# Patient Record
Sex: Female | Born: 1955
Health system: Southern US, Community
[De-identification: ages and names within clinical notes are randomized; demographics above are authoritative.]

## PROBLEM LIST (undated history)

## (undated) DIAGNOSIS — T7840XA Allergy, unspecified, initial encounter: Secondary | ICD-10-CM

## (undated) DIAGNOSIS — I10 Essential (primary) hypertension: Secondary | ICD-10-CM

## (undated) DIAGNOSIS — I4891 Unspecified atrial fibrillation: Secondary | ICD-10-CM

## (undated) DIAGNOSIS — I219 Acute myocardial infarction, unspecified: Secondary | ICD-10-CM

## (undated) DIAGNOSIS — G4730 Sleep apnea, unspecified: Secondary | ICD-10-CM

## (undated) DIAGNOSIS — A599 Trichomoniasis, unspecified: Secondary | ICD-10-CM

## (undated) DIAGNOSIS — R609 Edema, unspecified: Secondary | ICD-10-CM

## (undated) DIAGNOSIS — R600 Localized edema: Secondary | ICD-10-CM

## (undated) DIAGNOSIS — I509 Heart failure, unspecified: Secondary | ICD-10-CM

## (undated) DIAGNOSIS — R110 Nausea: Secondary | ICD-10-CM

## (undated) DIAGNOSIS — K7460 Unspecified cirrhosis of liver: Secondary | ICD-10-CM

## (undated) DIAGNOSIS — I2510 Atherosclerotic heart disease of native coronary artery without angina pectoris: Secondary | ICD-10-CM

## (undated) DIAGNOSIS — I499 Cardiac arrhythmia, unspecified: Secondary | ICD-10-CM

## (undated) DIAGNOSIS — R011 Cardiac murmur, unspecified: Secondary | ICD-10-CM

## (undated) DIAGNOSIS — J189 Pneumonia, unspecified organism: Secondary | ICD-10-CM

## (undated) DIAGNOSIS — H269 Unspecified cataract: Secondary | ICD-10-CM

## (undated) HISTORY — DX: Allergy, unspecified, initial encounter: T78.40XA

## (undated) HISTORY — DX: Trichomoniasis, unspecified: A59.9

## (undated) HISTORY — PX: CATARACT EXTRACTION W/ INTRAOCULAR LENS IMPLANT: SHX1309

## (undated) HISTORY — DX: Acute myocardial infarction, unspecified: I21.9

## (undated) HISTORY — PX: CORONARY ANGIOPLASTY WITH STENT PLACEMENT: SHX49

## (undated) HISTORY — PX: DILATION AND CURETTAGE, DIAGNOSTIC / THERAPEUTIC: SUR384

## (undated) HISTORY — DX: Unspecified cataract: H26.9

---

## 1980-06-06 HISTORY — PX: DILATION AND CURETTAGE OF UTERUS: SHX78

## 2009-08-04 DIAGNOSIS — I2510 Atherosclerotic heart disease of native coronary artery without angina pectoris: Secondary | ICD-10-CM

## 2009-08-04 HISTORY — DX: Atherosclerotic heart disease of native coronary artery without angina pectoris: I25.10

## 2010-06-06 HISTORY — PX: CORONARY ANGIOPLASTY WITH STENT PLACEMENT: SHX49

## 2016-02-03 ENCOUNTER — Emergency Department (HOSPITAL_COMMUNITY): Payer: Self-pay

## 2016-02-03 ENCOUNTER — Encounter (HOSPITAL_COMMUNITY): Payer: Self-pay | Admitting: *Deleted

## 2016-02-03 ENCOUNTER — Inpatient Hospital Stay (HOSPITAL_COMMUNITY)
Admission: EM | Admit: 2016-02-03 | Discharge: 2016-02-08 | DRG: 292 | Disposition: A | Discharge status: Home / Self Care | Payer: Medicaid Other | Attending: Internal Medicine | Admitting: Internal Medicine

## 2016-02-03 DIAGNOSIS — Z833 Family history of diabetes mellitus: Secondary | ICD-10-CM

## 2016-02-03 DIAGNOSIS — I509 Heart failure, unspecified: Secondary | ICD-10-CM

## 2016-02-03 DIAGNOSIS — I4891 Unspecified atrial fibrillation: Secondary | ICD-10-CM

## 2016-02-03 DIAGNOSIS — I252 Old myocardial infarction: Secondary | ICD-10-CM

## 2016-02-03 DIAGNOSIS — I110 Hypertensive heart disease with heart failure: Principal | ICD-10-CM | POA: Diagnosis present

## 2016-02-03 DIAGNOSIS — E7800 Pure hypercholesterolemia, unspecified: Secondary | ICD-10-CM | POA: Diagnosis present

## 2016-02-03 DIAGNOSIS — I10 Essential (primary) hypertension: Secondary | ICD-10-CM | POA: Diagnosis not present

## 2016-02-03 DIAGNOSIS — I4892 Unspecified atrial flutter: Secondary | ICD-10-CM

## 2016-02-03 DIAGNOSIS — Z8249 Family history of ischemic heart disease and other diseases of the circulatory system: Secondary | ICD-10-CM

## 2016-02-03 DIAGNOSIS — Z66 Do not resuscitate: Secondary | ICD-10-CM | POA: Diagnosis present

## 2016-02-03 DIAGNOSIS — I482 Chronic atrial fibrillation: Secondary | ICD-10-CM | POA: Diagnosis not present

## 2016-02-03 DIAGNOSIS — F17210 Nicotine dependence, cigarettes, uncomplicated: Secondary | ICD-10-CM | POA: Diagnosis present

## 2016-02-03 DIAGNOSIS — E785 Hyperlipidemia, unspecified: Secondary | ICD-10-CM | POA: Diagnosis present

## 2016-02-03 DIAGNOSIS — I5033 Acute on chronic diastolic (congestive) heart failure: Secondary | ICD-10-CM

## 2016-02-03 DIAGNOSIS — I2510 Atherosclerotic heart disease of native coronary artery without angina pectoris: Secondary | ICD-10-CM | POA: Diagnosis present

## 2016-02-03 DIAGNOSIS — Z955 Presence of coronary angioplasty implant and graft: Secondary | ICD-10-CM

## 2016-02-03 DIAGNOSIS — Z7901 Long term (current) use of anticoagulants: Secondary | ICD-10-CM

## 2016-02-03 DIAGNOSIS — Z9114 Patient's other noncompliance with medication regimen: Secondary | ICD-10-CM

## 2016-02-03 DIAGNOSIS — F1010 Alcohol abuse, uncomplicated: Secondary | ICD-10-CM | POA: Diagnosis present

## 2016-02-03 DIAGNOSIS — Z79899 Other long term (current) drug therapy: Secondary | ICD-10-CM

## 2016-02-03 HISTORY — DX: Essential (primary) hypertension: I10

## 2016-02-03 HISTORY — DX: Unspecified atrial fibrillation: I48.91

## 2016-02-03 HISTORY — DX: Atherosclerotic heart disease of native coronary artery without angina pectoris: I25.10

## 2016-02-03 HISTORY — DX: Heart failure, unspecified: I50.9

## 2016-02-03 LAB — PROTIME-INR
INR: 1.11
Prothrombin Time: 14.3 seconds (ref 11.4–15.2)

## 2016-02-03 LAB — COMPREHENSIVE METABOLIC PANEL
ALT: 19 U/L (ref 14–54)
AST: 16 U/L (ref 15–41)
Albumin: 3.6 g/dL (ref 3.5–5.0)
Alkaline Phosphatase: 61 U/L (ref 38–126)
Anion gap: 9 (ref 5–15)
BILIRUBIN TOTAL: 0.5 mg/dL (ref 0.3–1.2)
BUN: 8 mg/dL (ref 6–20)
CHLORIDE: 106 mmol/L (ref 101–111)
CO2: 27 mmol/L (ref 22–32)
Calcium: 9.3 mg/dL (ref 8.9–10.3)
Creatinine, Ser: 0.83 mg/dL (ref 0.44–1.00)
Glucose, Bld: 106 mg/dL — ABNORMAL HIGH (ref 65–99)
POTASSIUM: 3.6 mmol/L (ref 3.5–5.1)
Sodium: 142 mmol/L (ref 135–145)
TOTAL PROTEIN: 6.5 g/dL (ref 6.5–8.1)

## 2016-02-03 LAB — CBC WITH DIFFERENTIAL/PLATELET
Basophils Absolute: 0 10*3/uL (ref 0.0–0.1)
Basophils Relative: 0 %
EOS PCT: 1 %
Eosinophils Absolute: 0.1 10*3/uL (ref 0.0–0.7)
HEMATOCRIT: 39.9 % (ref 36.0–46.0)
Hemoglobin: 12.9 g/dL (ref 12.0–15.0)
LYMPHS ABS: 2.1 10*3/uL (ref 0.7–4.0)
LYMPHS PCT: 25 %
MCH: 32.3 pg (ref 26.0–34.0)
MCHC: 32.3 g/dL (ref 30.0–36.0)
MCV: 100 fL (ref 78.0–100.0)
MONO ABS: 0.6 10*3/uL (ref 0.1–1.0)
MONOS PCT: 7 %
NEUTROS ABS: 5.7 10*3/uL (ref 1.7–7.7)
Neutrophils Relative %: 67 %
PLATELETS: 231 10*3/uL (ref 150–400)
RBC: 3.99 MIL/uL (ref 3.87–5.11)
RDW: 13.2 % (ref 11.5–15.5)
WBC: 8.5 10*3/uL (ref 4.0–10.5)

## 2016-02-03 LAB — TROPONIN I

## 2016-02-03 LAB — BRAIN NATRIURETIC PEPTIDE: B NATRIURETIC PEPTIDE 5: 405 pg/mL — AB (ref 0.0–100.0)

## 2016-02-03 LAB — MRSA PCR SCREENING: MRSA BY PCR: NEGATIVE

## 2016-02-03 MED ORDER — APIXABAN 5 MG PO TABS
5.0000 mg | ORAL_TABLET | Freq: Two times a day (BID) | ORAL | Status: DC
  Administered 2016-02-03 – 2016-02-08 (×11): 5 mg via ORAL
  Filled 2016-02-03 (×11): qty 1

## 2016-02-03 MED ORDER — SODIUM CHLORIDE 0.9% FLUSH
3.0000 mL | INTRAVENOUS | Status: DC | PRN
  Administered 2016-02-04: 3 mL via INTRAVENOUS
  Filled 2016-02-03: qty 3

## 2016-02-03 MED ORDER — FUROSEMIDE 10 MG/ML IJ SOLN
40.0000 mg | Freq: Once | INTRAMUSCULAR | Status: AC
  Administered 2016-02-03: 40 mg via INTRAVENOUS
  Filled 2016-02-03: qty 4

## 2016-02-03 MED ORDER — SODIUM CHLORIDE 0.9% FLUSH
3.0000 mL | Freq: Two times a day (BID) | INTRAVENOUS | Status: DC
  Administered 2016-02-03 – 2016-02-08 (×10): 3 mL via INTRAVENOUS

## 2016-02-03 MED ORDER — ONDANSETRON HCL 4 MG/2ML IJ SOLN: 4.0000 mg | Freq: Four times a day (QID) | INTRAMUSCULAR | Status: DC | PRN

## 2016-02-03 MED ORDER — METOPROLOL TARTRATE 50 MG PO TABS
50.0000 mg | ORAL_TABLET | Freq: Once | ORAL | Status: AC
  Administered 2016-02-03: 50 mg via ORAL
  Filled 2016-02-03: qty 1

## 2016-02-03 MED ORDER — LOSARTAN POTASSIUM 50 MG PO TABS
50.0000 mg | ORAL_TABLET | Freq: Every day | ORAL | Status: DC
  Administered 2016-02-04: 50 mg via ORAL
  Filled 2016-02-03: qty 1

## 2016-02-03 MED ORDER — LOSARTAN POTASSIUM 50 MG PO TABS
ORAL_TABLET | ORAL | Status: AC
  Filled 2016-02-03: qty 1

## 2016-02-03 MED ORDER — METOPROLOL TARTRATE 50 MG PO TABS
50.0000 mg | ORAL_TABLET | Freq: Two times a day (BID) | ORAL | Status: DC
  Administered 2016-02-03: 50 mg via ORAL
  Filled 2016-02-03: qty 1

## 2016-02-03 MED ORDER — ACETAMINOPHEN 325 MG PO TABS: 650.0000 mg | ORAL_TABLET | ORAL | Status: DC | PRN

## 2016-02-03 MED ORDER — LOSARTAN POTASSIUM 50 MG PO TABS
50.0000 mg | ORAL_TABLET | Freq: Once | ORAL | Status: AC
  Administered 2016-02-03: 50 mg via ORAL
  Filled 2016-02-03: qty 1

## 2016-02-03 MED ORDER — FUROSEMIDE 10 MG/ML IJ SOLN
20.0000 mg | Freq: Two times a day (BID) | INTRAMUSCULAR | Status: DC
  Administered 2016-02-03 – 2016-02-05 (×4): 20 mg via INTRAVENOUS
  Filled 2016-02-03 (×4): qty 2

## 2016-02-03 MED ORDER — ASPIRIN 81 MG PO CHEW
324.0000 mg | CHEWABLE_TABLET | Freq: Once | ORAL | Status: AC
  Administered 2016-02-03: 324 mg via ORAL
  Filled 2016-02-03: qty 4

## 2016-02-03 MED ORDER — METOPROLOL TARTRATE 5 MG/5ML IV SOLN
5.0000 mg | Freq: Once | INTRAVENOUS | Status: AC
Start: 2016-02-03 — End: 2016-02-03
  Administered 2016-02-03: 5 mg via INTRAVENOUS
  Filled 2016-02-03: qty 5

## 2016-02-03 MED ORDER — HYDRALAZINE HCL 25 MG PO TABS
25.0000 mg | ORAL_TABLET | Freq: Four times a day (QID) | ORAL | Status: DC | PRN
  Administered 2016-02-03: 25 mg via ORAL
  Filled 2016-02-03: qty 1

## 2016-02-03 MED ORDER — HYDRALAZINE HCL 20 MG/ML IJ SOLN
10.0000 mg | Freq: Once | INTRAMUSCULAR | Status: AC
  Administered 2016-02-03: 10 mg via INTRAVENOUS
  Filled 2016-02-03: qty 1

## 2016-02-03 MED ORDER — SODIUM CHLORIDE 0.9 % IV SOLN: 250.0000 mL | INTRAVENOUS | Status: DC | PRN

## 2016-02-03 NOTE — Progress Notes (Signed)
Called report to [facility] on 300 who will be taking patient. Patient has been alert and oriented with no complaints today.

## 2016-02-03 NOTE — Progress Notes (Signed)
ANTICOAGULATION CONSULT NOTE - Initial Consult  Pharmacy Consult for APIXABAN Indication: atrial fibrillation  No Known Allergies  Patient Measurements: Height: 5\' 3"  (160 cm) Weight: 208 lb 5.4 oz (94.5 kg) IBW/kg (Calculated) : 52.4  Vital Signs: Temp: 97.2 F (36.2 C) (08/30 0750) Temp Source: Oral (08/30 0750) BP: 147/105 (08/30 0530) Pulse Rate: 96 (08/30 0530)  Labs:  Recent Labs  02/03/16 0253 02/03/16 0428  HGB 12.9  --   HCT 39.9  --   PLT 231  --   LABPROT  --  14.3  INR  --  1.11  CREATININE 0.83  --   TROPONINI <0.03  --    Estimated Creatinine Clearance: 80.7 mL/min (by C-G formula based on SCr of 0.83 mg/dL).  Medical History: Past Medical History:  Diagnosis Date  . Atrial fibrillation ([place])   . CHF (congestive heart failure) ([place])   . Hypertension    Medications:  Prescriptions Prior to Admission  Medication Sig Dispense Refill Last Dose  . amiodarone (PACERONE) 200 MG tablet Take 200 mg by mouth daily.   02/02/2016 at Unknown time  . apixaban (ELIQUIS) 5 MG TABS tablet Take 5 mg by mouth 2 (two) times daily.   02/02/2016  . aspirin EC 81 MG tablet Take 81 mg by mouth daily.   02/02/2016 at Unknown time  . atorvastatin (LIPITOR) 20 MG tablet Take 20 mg by mouth daily.   02/02/2016 at Unknown time  . furosemide (LASIX) 40 MG tablet Take 40 mg by mouth daily.   02/02/2016 at Unknown time  . losartan (COZAAR) 50 MG tablet Take 50 mg by mouth daily.   01/28/2016  . metoprolol (LOPRESSOR) 100 MG tablet Take 100 mg by mouth daily.   01/28/2016    Assessment: 60yo female on Apixaban PTA.  No bleeding reported.  CBC stable.  Last dose reportedly 8/29 but INR at baseline (which may indicate pt has not had dose)  Goal of Therapy:  Anticoagulation with Apixaban Monitor platelets by anticoagulation protocol: Yes   Plan:  Resume home dose:  Apixaban 5mg  po bid Monitor labs, s/sx of bleeding complications  Hart Robinsons A 02/03/2016,11:07 AM

## 2016-02-03 NOTE — H&P (Signed)
TRH H&P   Patient Demographics:    Rhonda [name], is a 60 y.o. female  MRN: [id number]  DOB - Apr 05, 1956  Admit Date - 02/03/2016  Outpatient Primary MD for the patient is No PCP Per Patient  Referring MD/NP/PA: Dr Ward  Patient coming from: Home  Chief Complaint  Patient presents with  . Hypertension      HPI:    Rhonda [name]  is a 60 y.o. female, With history of hypertension, hyperlipidemia, CHF, atrial fibrillation on Eliquis, who came to ED with worsening shortness of breath for possible days. Patient also has been having worsening of lower extremity swelling. Patient just moved to [place] from [place], and ran out of her medications including losartan and metoprolol. Patient also takes amiodarone (dose not known at this time).  She denies chest pain, no nausea vomiting or diarrhea.  In the ED lab revealed BNP 405, patient is hypertensive. Chest x-ray showed interstitial edema Patient received 40 mg IV Lasix, metoprolol 50 mg by mouth 1 and losartan 50 mg 1    Review of systems:    In addition to the HPI above,  No Fever-chills, No Headache, No changes with Vision or hearing, No problems swallowing food or Liquids, No Abdominal pain, No Nausea or Vomiting, bowel movements are regular, No Blood in stool or Urine, No dysuria, No new skin rashes or bruises, No new joints pains-aches,  No new weakness, tingling, numbness in any extremity, No recent weight gain or loss, No polyuria, polydypsia or polyphagia, No significant Mental Stressors.  A full 10 point Review of Systems was done, except as stated above, all other Review of Systems were negative.   With Past History of the following :    Past Medical History:  Diagnosis Date  . Atrial fibrillation ([place])   . CHF (congestive heart failure) ([place])   . Hypertension       Past Surgical History:  Procedure Laterality Date   . c-section    . CORONARY ANGIOPLASTY WITH STENT PLACEMENT        Social History:     Social History  Substance Use Topics  . Smoking status: Current Every Day Smoker    Packs/day: 0.20    Types: Cigarettes  . Smokeless tobacco: Never Used  . Alcohol use Yes     Comment: a few times times per week        Family History :   Patient's mother, grandmother had history of CAD   Home Medications:   Prior to Admission medications   Not on File     Allergies:    No Known Allergies   Physical Exam:   Vitals  Blood pressure (!) 147/105, pulse 96, temperature 98.7 F (37.1 C), temperature source Oral, resp. rate 22, height 5\' 3"  (1.6 m), weight 90.7 kg (200 lb), SpO2 97 %.   1. General Caucasian female lying in bed in NAD, cooperative with exam  2. Normal affect and insight, Awake Alert, Oriented X 3.  3. No  F.N deficits, ALL C.Nerves Intact, Strength 5/5 all 4 extremities, Sensation intact all 4 extremities, Plantars down going.  4. Ears and Eyes appear Normal, Conjunctivae clear, PERRLA. Moist Oral Mucosa.  5. Supple Neck, No JVD, No cervical lymphadenopathy appriciated, No Carotid Bruits.  6. Symmetrical Chest wall movement, bibasilar crackles  7. RRR, No Gallops, Rubs or Murmurs, No Parasternal Heave.Bilateral 1+ pitting edema of lower extremities  8. Positive Bowel Sounds, Abdomen Soft, No tenderness, No organomegaly appriciated,No rebound -guarding or rigidity.  9.  No Cyanosis, Normal Skin Turgor, No Skin Rash or Bruise.  10. Good muscle tone,  joints appear normal , no effusions, Normal ROM.      Data Review:    CBC  Recent Labs Lab 02/03/16 0253  WBC 8.5  HGB 12.9  HCT 39.9  PLT 231  MCV 100.0  MCH 32.3  MCHC 32.3  RDW 13.2  LYMPHSABS 2.1  MONOABS 0.6  EOSABS 0.1  BASOSABS 0.0   ------------------------------------------------------------------------------------------------------------------  Chemistries   Recent Labs Lab  02/03/16 0253  NA 142  K 3.6  CL 106  CO2 27  GLUCOSE 106*  BUN 8  CREATININE 0.83  CALCIUM 9.3  AST 16  ALT 19  ALKPHOS 61  BILITOT 0.5   ------------------------------------------------------------------------------------------------------------------  ------------------------------------------------------------------------------------------------------------------ GFR: Estimated Creatinine Clearance: 79 mL/min (by C-G formula based on SCr of 0.83 mg/dL). Liver Function Tests:  Recent Labs Lab 02/03/16 0253  AST 16  ALT 19  ALKPHOS 61  BILITOT 0.5  PROT 6.5  ALBUMIN 3.6   No results for input(s): LIPASE, AMYLASE in the last 168 hours. No results for input(s): AMMONIA in the last 168 hours. Coagulation Profile:  Recent Labs Lab 02/03/16 0428  INR 1.11   Cardiac Enzymes:  Recent Labs Lab 02/03/16 0253  TROPONINI <0.03   BNP (last 3 results) No results for input(s): PROBNP in the last 8760 hours. HbA1C: No results for input(s): HGBA1C in the last 72 hours. CBG: No results for input(s): GLUCAP in the last 168 hours. Lipid Profile: No results for input(s): CHOL, HDL, LDLCALC, TRIG, CHOLHDL, LDLDIRECT in the last 72 hours. Thyroid Function Tests: No results for input(s): TSH, T4TOTAL, FREET4, T3FREE, THYROIDAB in the last 72 hours. Anemia Panel: No results for input(s): VITAMINB12, FOLATE, FERRITIN, TIBC, IRON, RETICCTPCT in the last 72 hours.  --------------------------------------------------------------------------------------------------------------- Urine analysis: No results found for: COLORURINE, APPEARANCEUR, LABSPEC, PHURINE, GLUCOSEU, HGBUR, BILIRUBINUR, KETONESUR, PROTEINUR, UROBILINOGEN, NITRITE, LEUKOCYTESUR    ----------------------------------------------------------------------------------------------------------------   Imaging Results:    Dg Chest 2 View  Result Date: 02/03/2016 CLINICAL DATA:  Worsening shortness of breath and  bilateral lower extremity swelling for 1 week. History of hypertension, congestive failure, atrial fibrillation, smoker EXAM: CHEST  2 VIEW COMPARISON:  None. FINDINGS: Cardiac enlargement with mild central pulmonary vascular congestion. Interstitial pattern to the lungs suggesting early edema. No focal consolidation. No blunting of costophrenic angles. No pneumothorax. Mediastinal contours appear intact. Tortuous aorta. IMPRESSION: Mild cardiac enlargement and pulmonary vascular congestion with diffuse interstitial edema. Electronically Signed   By: Lucienne Capers M.D.   On: 02/03/2016 03:40       Assessment & Plan:    Active Problems:   CHF exacerbation (HCC)   HTN (hypertension)   Atrial fibrillation ([place])   1. CHF exacerbation- patient had echocardiogram done in [place] seven months ago, will obtain records from [facility] in [place]. Patient received Lasix 40 mg IV in the ED, the start Lasix 20 mg IV every 12 hours, started intake and output. Daily  weights 2. Hypertension- continue metoprolol 50 mg by mouth twice a day, losartan 50 mg daily, start hydralazine 25 mg by mouth every 6 hours when necessary for BP greater than 160/100. 3. Atrial fibrillation- heart rate is controlled, patient takes amiodarone at home. Dose not known, her daughter will get that medication from home and that will have to be started. Continue anticoagulation with Eliquis.    DVT Prop hylaxis-   Eliquis  AM Labs Ordered, also please review Full Orders  Family Communication: Admission, patients condition and plan of care including tests being ordered have been discussed with the patient and her daughter at bedside who indicate understanding and agree with the plan and Code Status.  Code Status:  DO NOT RESUSCITATE  Admission status: Observation    Time spent in minutes : 60 minutes   LAMA,GAGAN S M.D on 02/03/2016 at 5:43 AM  Between 7am to 7pm - Pager - [phone]. After 7pm go to  [email or web address] - password [facility]  [facility] - Office  [phone]

## 2016-02-03 NOTE — Progress Notes (Signed)
PROGRESS NOTE    Rhonda [name]  [id number] DOB: 03-30-1956 DOA: 02/03/2016 PCP: No PCP Per Patient    Brief Narrative:  60 y.o. female, With history of hypertension, hyperlipidemia, CHF, atrial fibrillation on Eliquis, who came to ED with worsening shortness of breath for possible days. Patient also has been having worsening of lower extremity swelling. Patient just moved to [place] from [place], and ran out of her medications including losartan and metoprolol. Patient also takes amiodarone (dose not known at this time).  She denies chest pain, no nausea vomiting or diarrhea.  In the ED lab revealed BNP 405, patient is hypertensive. Chest x-ray showed interstitial edema Patient received 40 mg IV Lasix, metoprolol 50 mg by mouth 1 and losartan 50 mg 1  Assessment & Plan:   Principal Problem:   CHF exacerbation ([place]) Active Problems:   HTN (hypertension)   Atrial fibrillation ([place])   1. Acute CHF exacerbation 1. Unknown if systolic versus diastolic, unknown EF 2. Patient reports having done a 2-D echocardiogram several months prior to this hospital [place] 3. We'll request records from outside hospital, pending 4. For now, we'll continue patient on IV twice a day Lasix as tolerated. 5. Continue to monitor I's and O's and daily weights 6. Patient has shown clinical improvement thus far since admission 2. Hypertension 1. Blood pressure currently stable. 2. Continue current regimen consisting of losartan and metoprolol as well as diuretic 3. Atrial fibrillation 1. Currently rate controlled 2. Continue monitor on telemetry 3. We'll continue on apixaban as tolerated for secondary stroke prevention  DVT prophylaxis: Apixaban Code Status: Full code Family Communication: He should room, family at bedside Disposition Plan: Possible discharge in 24-48 hours  Consultants:     Procedures:     Antimicrobials: Anti-infectives    None       Subjective: Reports feeling much better today. Questioning about going home today. Denies further shortness of breath. Denies chest pain  Objective: Vitals:   02/03/16 1000 02/03/16 1100 02/03/16 1200 02/03/16 1300  BP: (!) 134/102 (!) 139/111 (!) 157/110 (!) 138/106  Pulse:      Resp: (!) 24 (!) 30 14 (!) 25  Temp:      TempSrc:      SpO2: 96% 99% 97% 96%  Weight:      Height:        Intake/Output Summary (Last 24 hours) at 02/03/16 1401 Last data filed at 02/03/16 0900  Gross per 24 hour  Intake              360 ml  Output                0 ml  Net              360 ml   Filed Weights   02/03/16 0221 02/03/16 0609  Weight: 90.7 kg (200 lb) 94.5 kg (208 lb 5.4 oz)    Examination:  General exam: Appears calm and comfortable, Lying in bed Respiratory system: Clear to auscultation. Respiratory effort normal. Cardiovascular system: S1 & S2 heard, RRR. Marland Kitchen Gastrointestinal system: Abdomen is nondistended, soft and nontender. No organomegaly or masses felt. Normal bowel sounds heard. Central nervous system: Alert and oriented. No focal neurological deficits. Extremities: Symmetric 5 x 5 power. Skin: No rashes, lesions, no clubbing or cyanosis Psychiatry: Judgement and insight appear normal. Mood & affect appropriate.   Data Reviewed: I have personally reviewed following labs and imaging studies  CBC:  Recent Labs  Lab 02/03/16 0253  WBC 8.5  NEUTROABS 5.7  HGB 12.9  HCT 39.9  MCV 100.0  PLT [id number]   Basic Metabolic Panel:  Recent Labs Lab 02/03/16 0253  NA 142  K 3.6  CL 106  CO2 27  GLUCOSE 106*  BUN 8  CREATININE 0.83  CALCIUM 9.3   GFR: Estimated Creatinine Clearance: 80.7 mL/min (by C-G formula based on SCr of 0.83 mg/dL). Liver Function Tests:  Recent Labs Lab 02/03/16 0253  AST 16  ALT 19  ALKPHOS 61  BILITOT 0.5  PROT 6.5  ALBUMIN 3.6   No results for input(s): LIPASE, AMYLASE in the last 168 hours. No results for input(s): AMMONIA in  the last 168 hours. Coagulation Profile:  Recent Labs Lab 02/03/16 0428  INR 1.11   Cardiac Enzymes:  Recent Labs Lab 02/03/16 0253  TROPONINI <0.03   BNP (last 3 results) No results for input(s): PROBNP in the last 8760 hours. HbA1C: No results for input(s): HGBA1C in the last 72 hours. CBG: No results for input(s): GLUCAP in the last 168 hours. Lipid Profile: No results for input(s): CHOL, HDL, LDLCALC, TRIG, CHOLHDL, LDLDIRECT in the last 72 hours. Thyroid Function Tests: No results for input(s): TSH, T4TOTAL, FREET4, T3FREE, THYROIDAB in the last 72 hours. Anemia Panel: No results for input(s): VITAMINB12, FOLATE, FERRITIN, TIBC, IRON, RETICCTPCT in the last 72 hours. Sepsis Labs: No results for input(s): PROCALCITON, LATICACIDVEN in the last 168 hours.  No results found for this or any previous visit (from the past 240 hour(s)).   Radiology Studies: Dg Chest 2 View  Result Date: 02/03/2016 CLINICAL DATA:  Worsening shortness of breath and bilateral lower extremity swelling for 1 week. History of hypertension, congestive failure, atrial fibrillation, smoker EXAM: CHEST  2 VIEW COMPARISON:  None. FINDINGS: Cardiac enlargement with mild central pulmonary vascular congestion. Interstitial pattern to the lungs suggesting early edema. No focal consolidation. No blunting of costophrenic angles. No pneumothorax. Mediastinal contours appear intact. Tortuous aorta. IMPRESSION: Mild cardiac enlargement and pulmonary vascular congestion with diffuse interstitial edema. Electronically Signed   By: Lucienne Capers M.D.   On: 02/03/2016 03:40    Scheduled Meds: . apixaban  5 mg Oral BID  . furosemide  20 mg Intravenous Q12H  . [START ON 02/04/2016] losartan  50 mg Oral Daily  . metoprolol tartrate  50 mg Oral Q12H  . sodium chloride flush  3 mL Intravenous Q12H   Continuous Infusions:    LOS: 0 days   Sigmund Morera, Orpah Melter, MD [facility] Pager [phone]  If 7PM-7AM,  please contact night-coverage [email or web address] Password [email or web address] 02/03/2016, 2:01 PM

## 2016-02-03 NOTE — ED Provider Notes (Signed)
TIME SEEN: 2:45 AM  CHIEF COMPLAINT: Shortness of breath, lower extremity swelling  HPI: Pt is a 60 y.o. female with history of hypertension, hyperlipidemia, atrial fibrillation on Eliquis, CHF who presents to the emergency department with complaints of increased shortness of breath with exertion over the past several days, increased lower extremity swelling. She states she has had several months of shortness of breath and lower extremity swelling but this is progressively getting worse especially over the last week. States she just moved here from [place] and does not have a PCP and ran out of many of her medications including her metoprolol, losartan. She has been taking only half of her Lasix dose daily. Is supposed to be on 40 mg a day. Has not missed any doses of her amiodarone or Eliquis. States that her daughter checked her vital signs at home tonight and her heart rate was elevated as well as her blood pressure. She denies any chest pain or chest discomfort. No fever or cough. Daughter does report that she also has a history of alcohol abuse and was drinking a plate of ROM every day. She did have one beer today. She has not stopped drinking but daughter reports she has cut down.  ROS: See HPI Constitutional: no fever  Eyes: no drainage  ENT: no runny nose   Cardiovascular:  no chest pain  Resp:  SOB  GI: no vomiting GU: no dysuria Integumentary: no rash  Allergy: no hives  Musculoskeletal: no leg swelling  Neurological: no slurred speech ROS otherwise negative  PAST MEDICAL HISTORY/PAST SURGICAL HISTORY:  Past Medical History:  Diagnosis Date  . Atrial fibrillation ([place])   . CHF (congestive heart failure) ([place])   . Hypertension     MEDICATIONS:  Prior to Admission medications   Not on File    ALLERGIES:  No Known Allergies  SOCIAL HISTORY:  Social History  Substance Use Topics  . Smoking status: Current Every Day Smoker    Packs/day: 0.20    Types: Cigarettes  .  Smokeless tobacco: Never Used  . Alcohol use Yes     Comment: a few times times per week    FAMILY HISTORY: History reviewed. No pertinent family history.  EXAM: BP (!) 156/109   Pulse 90   Temp 98.7 F (37.1 C) (Oral)   Resp 23   Ht 5\' 3"  (1.6 m)   Wt 200 lb (90.7 kg)   SpO2 93%   BMI 35.43 kg/m  CONSTITUTIONAL: Alert and oriented and responds appropriately to questions. Chronically ill-appearing, does appear to get short of breath just with talking to me HEAD: Normocephalic EYES: Conjunctivae clear, PERRL ENT: normal nose; no rhinorrhea; moist mucous membranes NECK: Supple, no meningismus, no LAD  CARD: Irregularly irregular and tachycardic; S1 and S2 appreciated; no murmurs, no clicks, no rubs, no gallops RESP: Normal chest excursion without splinting, patient is tachypneic; breath sounds equal bilaterally; no wheezes, no rhonchi, patient does have bibasilar Rales, no hypoxia or respiratory distress, speaking full sentences but does appear to become short of breath with just speaking at rest ABD/GI: Normal bowel sounds; non-distended; soft, non-tender, no rebound, no guarding, no peritoneal signs BACK:  The back appears normal and is non-tender to palpation, there is no CVA tenderness EXT: Normal ROM in all joints; non-tender to palpation; pitting edema to the bilateral lower extremities and feet, 2+ DP pulses bilaterally, no erythema or warmth, compartments are soft, no joint effusion,; normal capillary refill; no cyanosis, no calf tenderness or swelling  SKIN: Normal color for age and race; warm; no rash NEURO: Moves all extremities equally, sensation to light touch intact diffusely, cranial nerves II through XII intact PSYCH: The patient's mood and manner are appropriate. Grooming and personal hygiene are appropriate.  MEDICAL DECISION MAKING: Patient here with volume overload. She is short of breath with exertion and has increased lower extremity swelling. Likely secondary  to medical noncompliance given she has run out of medications and recently moved here from [place] and does not have a PCP. Will check labs, chest x-ray. EKG shows atrial flutter with no old for comparison. We'll give IV Lasix, a dose of her metoprolol and losartan, aspirin.  ED PROGRESS: Patient's chest x-ray shows pulmonary edema. I feel she will need admission for IV diuresis and she agrees with this plan. Troponin is negative.   4:50 AM  Rcvd records from [facility] in [place], [place].  Patient had echocardiogram on 01/06/2015. That time there was ejection fraction of 50% with grade 2 diastolic dysfunction. Mild to moderate mitral regurgitation. Left atrium was severely dilated.  5:20 AM  Discussed patient's case with hospitalist, Dr. Darrick Meigs.  Recommend admission to telemetry, observation bed.  I will place holding orders per their request. Patient and family (if present) updated with plan. Care transferred to hospitalist service.  I reviewed all nursing notes, vitals, pertinent old records, EKGs, labs, imaging (as available).     Date: 02/03/2016 2:37 AM  Rate: 117  Rhythm: Atrial flutter with 2:1 AV block  QRS Axis: normal  Intervals: QTC 538 ms  ST/T Wave abnormalities: normal  Conduction Disutrbances: none  Narrative Interpretation: Atrial flutter with 2:1 AV block, no old for comparison    Date: 02/03/2016 3:43 AM  Rate: 96  Rhythm: normal sinus rhythm  QRS Axis: normal  Intervals: QTC 507 ms  ST/T Wave abnormalities: normal  Conduction Disutrbances: none  Narrative Interpretation: Atrial flutter, rate improved compared to prior                [employer], DO 02/03/16 [id number]

## 2016-02-03 NOTE — Care Management Note (Signed)
Case Management Note  Patient Details  Name: Rhonda [name] MRN: [id number] Date of Birth: 01-16-1956  Subjective/Objective:                  Pt admitted with CHF. She has recently moved from [place] were she had Medicaid. She does not have PCP and has not yet applied for [place] Medicaid. She has run out of her medications. She has asked her daughter in [place] to have her Rx filled and mail them to [facility]. She will need to apply for [facility] Medicaid and will need assistance with medications. She is aware she will only be eligible for assistance once with the [facility] voucher. Many of her medications are not on the $4 list. She has been made new pt appointment with the [facility] on [address]. She will also f/u with cardiology. She has been made a referral to the [facility]. To assist with Medicaid application. She will be given MATCH voucher at DC if needed.   Action/Plan: Will cont to follow.   Expected Discharge Date:     02/05/2016             Expected Discharge Plan:  Home/Self Care  In-House Referral:  Financial Counselor  Discharge planning Services  CM Consult, Follow-up appt scheduled, Medication Assistance, [place] Program  Post Acute Care Choice:  NA Choice offered to:  NA  DME Arranged:    DME Agency:     HH Arranged:    HH Agency:     Status of Service:  In process, will continue to follow  If discussed at Long Length of Stay Meetings, dates discussed:    Additional Comments:  Sherald Barge, RN 02/03/2016, 3:25 PM

## 2016-02-04 ENCOUNTER — Inpatient Hospital Stay (HOSPITAL_COMMUNITY): Payer: Medicaid Other

## 2016-02-04 ENCOUNTER — Encounter (HOSPITAL_COMMUNITY): Payer: Self-pay | Admitting: Adult Health

## 2016-02-04 DIAGNOSIS — I5020 Unspecified systolic (congestive) heart failure: Secondary | ICD-10-CM | POA: Diagnosis not present

## 2016-02-04 DIAGNOSIS — I509 Heart failure, unspecified: Secondary | ICD-10-CM

## 2016-02-04 DIAGNOSIS — I482 Chronic atrial fibrillation: Secondary | ICD-10-CM

## 2016-02-04 DIAGNOSIS — I10 Essential (primary) hypertension: Secondary | ICD-10-CM | POA: Diagnosis not present

## 2016-02-04 DIAGNOSIS — I5023 Acute on chronic systolic (congestive) heart failure: Secondary | ICD-10-CM

## 2016-02-04 DIAGNOSIS — I4892 Unspecified atrial flutter: Secondary | ICD-10-CM | POA: Diagnosis not present

## 2016-02-04 DIAGNOSIS — I5033 Acute on chronic diastolic (congestive) heart failure: Secondary | ICD-10-CM | POA: Diagnosis not present

## 2016-02-04 LAB — BASIC METABOLIC PANEL
Anion gap: 7 (ref 5–15)
BUN: 12 mg/dL (ref 6–20)
CHLORIDE: 102 mmol/L (ref 101–111)
CO2: 30 mmol/L (ref 22–32)
CREATININE: 0.95 mg/dL (ref 0.44–1.00)
Calcium: 9 mg/dL (ref 8.9–10.3)
GFR calc Af Amer: >60 mL/min (ref >60)
GLUCOSE: 96 mg/dL (ref 65–99)
Potassium: 3.5 mmol/L (ref 3.5–5.1)
SODIUM: 139 mmol/L (ref 135–145)

## 2016-02-04 MED ORDER — THIAMINE HCL 100 MG/ML IJ SOLN
100.0000 mg | Freq: Every day | INTRAMUSCULAR | Status: DC
  Filled 2016-02-04: qty 2

## 2016-02-04 MED ORDER — VITAMIN B-1 100 MG PO TABS
100.0000 mg | ORAL_TABLET | Freq: Every day | ORAL | Status: DC
  Administered 2016-02-04 – 2016-02-08 (×5): 100 mg via ORAL
  Filled 2016-02-04 (×5): qty 1

## 2016-02-04 MED ORDER — POTASSIUM CHLORIDE CRYS ER 20 MEQ PO TBCR
20.0000 meq | EXTENDED_RELEASE_TABLET | Freq: Every day | ORAL | Status: DC
  Administered 2016-02-04 – 2016-02-08 (×5): 20 meq via ORAL
  Filled 2016-02-04 (×5): qty 1

## 2016-02-04 MED ORDER — LOSARTAN POTASSIUM 50 MG PO TABS
75.0000 mg | ORAL_TABLET | Freq: Every day | ORAL | Status: DC
  Administered 2016-02-05 – 2016-02-08 (×4): 75 mg via ORAL
  Filled 2016-02-04 (×4): qty 2

## 2016-02-04 MED ORDER — LORAZEPAM 1 MG PO TABS: 0.0000 mg | ORAL_TABLET | Freq: Two times a day (BID) | ORAL | Status: AC

## 2016-02-04 MED ORDER — LORAZEPAM 1 MG PO TABS: 0.0000 mg | ORAL_TABLET | Freq: Four times a day (QID) | ORAL | Status: AC

## 2016-02-04 MED ORDER — FOLIC ACID 1 MG PO TABS
1.0000 mg | ORAL_TABLET | Freq: Every day | ORAL | Status: DC
  Administered 2016-02-04 – 2016-02-08 (×5): 1 mg via ORAL
  Filled 2016-02-04 (×5): qty 1

## 2016-02-04 MED ORDER — METOPROLOL TARTRATE 50 MG PO TABS
100.0000 mg | ORAL_TABLET | Freq: Two times a day (BID) | ORAL | Status: DC
  Administered 2016-02-04 – 2016-02-08 (×9): 100 mg via ORAL
  Filled 2016-02-04 (×9): qty 2

## 2016-02-04 MED ORDER — AMIODARONE HCL 200 MG PO TABS
200.0000 mg | ORAL_TABLET | Freq: Every day | ORAL | Status: DC
  Administered 2016-02-04 – 2016-02-05 (×2): 200 mg via ORAL
  Filled 2016-02-04 (×2): qty 1

## 2016-02-04 MED ORDER — LORAZEPAM 2 MG/ML IJ SOLN: 1.0000 mg | Freq: Four times a day (QID) | INTRAMUSCULAR | Status: AC | PRN

## 2016-02-04 MED ORDER — LORAZEPAM 1 MG PO TABS: 1.0000 mg | ORAL_TABLET | Freq: Four times a day (QID) | ORAL | Status: AC | PRN

## 2016-02-04 MED ORDER — ADULT MULTIVITAMIN W/MINERALS CH
1.0000 | ORAL_TABLET | Freq: Every day | ORAL | Status: DC
  Administered 2016-02-04 – 2016-02-08 (×5): 1 via ORAL
  Filled 2016-02-04 (×5): qty 1

## 2016-02-04 NOTE — Progress Notes (Signed)
PROGRESS NOTE    Rhonda [name]  [id number] DOB: [birth year] DOA: 02/03/2016 PCP: No PCP Per Patient    Brief Narrative:  60 y.o. female, With history of hypertension, hyperlipidemia, CHF, atrial fibrillation on Eliquis, who came to ED with worsening shortness of breath for possible days. Patient also has been having worsening of lower extremity swelling. Patient just moved to [place] from [place], and ran out of her medications including losartan and metoprolol. Patient also takes amiodarone She denied chest pain, no nausea vomiting or diarrhea.  In the ED lab revealed BNP 405, patient is hypertensive. Chest x-ray showed interstitial edema Patient received 40 mg IV Lasix, metoprolol 50 mg by mouth 1 and losartan 50 mg 1  Assessment & Plan:   Principal Problem:   CHF exacerbation ([place]) Active Problems:   CHF (congestive heart failure), NYHA class I ([place])   Essential hypertension   Atrial fibrillation (HCC)   Atrial flutter ([place])   1. Acute CHF exacerbation 1. Unknown if systolic versus diastolic, unknown EF 2. Patient reports having done a 2-D echocardiogram several months prior to [place] hospital [place] 3. Will continue patient on IV twice a day Lasix 4. Discussed case with Cardiology. Plan to follow up on outpatient records. If EF is normal, then stop amiodarone and rate control pt. IF EF is low, then give loading dose of amiodarone. 2. Hypertension 1. Blood pressure currently stable. 2. Continue current regimen consisting of losartan and metoprolol as well as diuretic 3. Atrial fibrillation 1. Slightly elevated HR this AM 2. Increased metoprolol dose to 100mg  and added amiodarone this AM 3. Continue monitor on telemetry 4. We'll continue on apixaban as tolerated for secondary stroke prevention 5. Plan per Cardiology above  DVT prophylaxis: Apixaban Code Status: Full code Family Communication: Pt in room Disposition Plan: Possible discharge in  24-48 hours  Consultants:   Cardiology  Procedures:     Antimicrobials: Anti-infectives    None      Subjective: No complaints today. Denies sob, chest pain, abd pain. No dizziness.  Objective: Vitals:   02/03/16 1600 02/03/16 2109 02/03/16 2243 02/04/16 0548  BP: (!) 168/114 (!) 151/110 (!) 148/105 (!) 153/108  Pulse:  (!) 101  99  Resp: (!) 24 20  20   Temp:  98.7 F (37.1 C)  98.2 F (36.8 C)  TempSrc:  Oral  Oral  SpO2: 98% 96%  96%  Weight:    93.9 kg (207 lb)  Height:    5\' 3"  (1.6 m)    Intake/Output Summary (Last 24 hours) at 02/04/16 1558 Last data filed at 02/04/16 1004  Gross per 24 hour  Intake                3 ml  Output              900 ml  Net             -897 ml   Filed Weights   02/03/16 0221 02/03/16 0609 02/04/16 0548  Weight: 90.7 kg (200 lb) 94.5 kg (208 lb 5.4 oz) 93.9 kg (207 lb)    Examination:  General exam: Appears calm and comfortable, Lying in bed Respiratory system: Clear to auscultation. Respiratory effort normal. Cardiovascular system: S1 & S2 heard, RRR. Marland Kitchen Gastrointestinal system: Abdomen is nondistended, soft and nontender. No organomegaly or masses felt. Normal bowel sounds heard. Central nervous system: Alert and oriented. No focal neurological deficits. Extremities: Symmetric 5 x 5 power. Skin: No rashes,  lesions, no clubbing Psychiatry: Judgement and insight appear normal. Mood & affect appropriate.   Data Reviewed: I have personally reviewed following labs and imaging studies  CBC:  Recent Labs Lab 02/03/16 0253  WBC 8.5  NEUTROABS 5.7  HGB 12.9  HCT 39.9  MCV 100.0  PLT [id number]   Basic Metabolic Panel:  Recent Labs Lab 02/03/16 0253 02/04/16 0602  NA 142 139  K 3.6 3.5  CL 106 102  CO2 27 30  GLUCOSE 106* 96  BUN 8 12  CREATININE 0.83 0.95  CALCIUM 9.3 9.0   GFR: Estimated Creatinine Clearance: 70.3 mL/min (by C-G formula based on SCr of 0.95 mg/dL). Liver Function Tests:  Recent Labs Lab  02/03/16 0253  AST 16  ALT 19  ALKPHOS 61  BILITOT 0.5  PROT 6.5  ALBUMIN 3.6   No results for input(s): LIPASE, AMYLASE in the last 168 hours. No results for input(s): AMMONIA in the last 168 hours. Coagulation Profile:  Recent Labs Lab 02/03/16 0428  INR 1.11   Cardiac Enzymes:  Recent Labs Lab 02/03/16 0253  TROPONINI <0.03   BNP (last 3 results) No results for input(s): PROBNP in the last 8760 hours. HbA1C: No results for input(s): HGBA1C in the last 72 hours. CBG: No results for input(s): GLUCAP in the last 168 hours. Lipid Profile: No results for input(s): CHOL, HDL, LDLCALC, TRIG, CHOLHDL, LDLDIRECT in the last 72 hours. Thyroid Function Tests: No results for input(s): TSH, T4TOTAL, FREET4, T3FREE, THYROIDAB in the last 72 hours. Anemia Panel: No results for input(s): VITAMINB12, FOLATE, FERRITIN, TIBC, IRON, RETICCTPCT in the last 72 hours. Sepsis Labs: No results for input(s): PROCALCITON, LATICACIDVEN in the last 168 hours.  Recent Results (from the past 240 hour(s))  MRSA PCR Screening     Status: None   Collection Time: 02/03/16  6:33 AM  Result Value Ref Range Status   MRSA by PCR NEGATIVE NEGATIVE Final    Comment:        The GeneXpert MRSA Assay (FDA approved for NASAL specimens only), is one component of a comprehensive MRSA colonization surveillance program. It is not intended to diagnose MRSA infection nor to guide or monitor treatment for MRSA infections.      Radiology Studies: Dg Chest 2 View  Result Date: 02/03/2016 CLINICAL DATA:  Worsening shortness of breath and bilateral lower extremity swelling for 1 week. History of hypertension, congestive failure, atrial fibrillation, smoker EXAM: CHEST  2 VIEW COMPARISON:  None. FINDINGS: Cardiac enlargement with mild central pulmonary vascular congestion. Interstitial pattern to the lungs suggesting early edema. No focal consolidation. No blunting of costophrenic angles. No pneumothorax.  Mediastinal contours appear intact. Tortuous aorta. IMPRESSION: Mild cardiac enlargement and pulmonary vascular congestion with diffuse interstitial edema. Electronically Signed   By: Lucienne Capers M.D.   On: 02/03/2016 03:40    Scheduled Meds: . amiodarone  200 mg Oral Daily  . apixaban  5 mg Oral BID  . folic acid  1 mg Oral Daily  . furosemide  20 mg Intravenous Q12H  . LORazepam  0-4 mg Oral Q6H   Followed by  . [START ON 02/06/2016] LORazepam  0-4 mg Oral Q12H  . [START ON 02/05/2016] losartan  75 mg Oral Daily  . metoprolol tartrate  100 mg Oral Q12H  . multivitamin with minerals  1 tablet Oral Daily  . potassium chloride  20 mEq Oral Daily  . sodium chloride flush  3 mL Intravenous Q12H  . thiamine  100 mg  Oral Daily   Or  . thiamine  100 mg Intravenous Daily   Continuous Infusions:    LOS: 1 day   Saad Buhl, Orpah Melter, MD [facility] Pager [phone]  If 7PM-7AM, please contact night-coverage [email or web address] Password [facility] 02/04/2016, 3:58 PM
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
## 2016-02-04 NOTE — Progress Notes (Signed)
*  PRELIMINARY RESULTS* Echocardiogram 2D Echocardiogram has been performed.  Leavy Cella 02/04/2016, 4:37 PM

## 2016-02-04 NOTE — Consult Note (Signed)
CARDIOLOGY CONSULT NOTE   Patient ID: Rhonda [name] MRN: [id number] DOB/AGE: [birth year] 60 y.o.  Admit Date: 02/03/2016 Referring Physician: Ulla Gallo MD Primary Physician: No PCP Per Patient Consulting Cardiologist: Jenkins Rouge MD Primary Cardiologist: New Reason for Consultation: Atrial fib, Dyspnea, CHF  Clinical Summary Rhonda [name] is a 60 y.o.female with known history of CAD,  hypertension, hyperlipidemia, CHF, atrial fibrillation with admission through ER with worsening dyspnea. She has recently moved from [facility] to [place] to help her daughter with her children. She was being seen by Dr. Lynnae January MD with [facility].   On review of Care Everywhere her history includes CAD, with hx of inferior MI with BMS to RCA, CX and LAD disease in 08/2009. Diagnosis of Atrial fib with RVR in 08/2009, aborted TEE due to intra-proceural de-sataturations.   Most recent echo in 01/2015 "EF down to 30%, moderate-to-severe MR and TR. Her EF did improve when she was back in sinus rhythm. EF 65% 01/2014 and then last August EF 59% with grade 2 diastolic dysfunction, LA severely dilated, and mild-to-moderate MR."  She states that she began to have worsening dyspnea about a week before she moved to [place]. She contacted her cardiologist and was told to increase her lasix to 40 mg daily from 20 mg daily. She had a CXR which was negative for CHF, effusion or focal consolidation, (12/23/2015-Care Everywhere). She began to have worsening breathing status upon arriving to [facility] and continued to take lasix as directed. She ran out of metoprolol and losartan a week before admission. She continued amiodarone and Eliquis as directed.   She admits to having racing HR and palpitations since moving to [place]. She felt is was related to stress. She has a stressful living situation with daughter and grandchildren.   On arrival to ER BP was 162/123, HR 119, O2 sat 95%, afebrile. INR 1.11, Hgb 12.9, HCt  39.9. Na 143, Potassium 3.5, Creatinine 0.83. BNP 405. CXR determined pulmonary vascular congestion with diffusion interstitial edema. She was treated with IV lasix, 40 mg X 1, ASA, metoprolol 5 mg IV, losartan 50 mg and hydralazine 10 mg IV. She has diuresed 900 cc since admission with 537 output per I/O. She is feeling better without dyspnea at rest. She denies chest pain or edema. She states that her usual "dry wt" is 184-187, but wight on admission is 208. Review of Care Everywhere documentation of office notes document wt of 204 lbs in the office on 08/25/2015.   Allergies  Allergen Reactions  . Ace Inhibitors Cough    Medications Scheduled Medications: . amiodarone  200 mg Oral Daily  . apixaban  5 mg Oral BID  . furosemide  20 mg Intravenous Q12H  . losartan  50 mg Oral Daily  . metoprolol tartrate  100 mg Oral Q12H  . sodium chloride flush  3 mL Intravenous Q12H    Infusions:    PRN Medications: sodium chloride, acetaminophen, hydrALAZINE, ondansetron (ZOFRAN) IV, sodium chloride flush   Past Medical History:  Diagnosis Date  . Atrial fibrillation ([place])   . CAD (coronary artery disease) 08/2009   BMS to RCA, non-obstructive in CX and LAD 2011  . CHF (congestive heart failure) ([place])   . Hypertension     Past Surgical History:  Procedure Laterality Date  . c-section    . CORONARY ANGIOPLASTY WITH STENT PLACEMENT      Family History  Problem Relation Age of Onset  . CAD Mother   .  Diabetes Mellitus II Father   . Hypertension Sister   . CAD Maternal Grandmother     Social History Rhonda [name] reports that she has been smoking Cigarettes.  She has been smoking about 0.20 packs per day. She has never used smokeless tobacco. Rhonda [name] reports that she drinks about 1.2 oz of alcohol per week .  Review of Systems Complete review of systems are found to be negative unless outlined in H&P above.  Physical Examination Blood pressure (!) 153/108, pulse 99, temperature  98.2 F (36.8 C), temperature source Oral, resp. rate 20, height 5\' 3"  (1.6 m), weight 207 lb (93.9 kg), SpO2 96 %.  Intake/Output Summary (Last 24 hours) at 02/04/16 1027 Last data filed at 02/04/16 1004  Gross per 24 hour  Intake                3 ml  Output              900 ml  Net             -897 ml    Telemetry: Atrial fib rate of 104 bpm  GEN: No acute distress HEENT: Conjunctiva and lids normal, oropharynx clear with moist mucosa. Neck: Supple, no elevated JVP or carotid bruits, no thyromegaly. Lungs: Bibasilar crackles no wheezes,  Cardiac: Iregular rate and rhythm, tachycardic, no S3 or significant systolic murmur, no pericardial rub. Abdomen: Soft, nontender, no hepatomegaly, bowel sounds present, no guarding or rebound. Obese.  Extremities: No pitting edema, distal pulses 2+. Skin: Warm and dry. Musculoskeletal: No kyphosis. Neuropsychiatric: Alert and oriented x3, affect grossly appropriate.  Prior Cardiac Testing/Procedures Echo 01/2015  EF down to 30%, moderate-to-severe MR and TR. Her EF did improve when she was back in sinus rhythm. EF 65% 01/2014 and then last August EF 59% with grade 2 diastolic dysfunction, LA severely dilated, and mild-to-moderate MR  Lab Results  Basic Metabolic Panel:  Recent Labs Lab 02/03/16 0253 02/04/16 0602  NA 142 139  K 3.6 3.5  CL 106 102  CO2 27 30  GLUCOSE 106* 96  BUN 8 12  CREATININE 0.83 0.95  CALCIUM 9.3 9.0    Liver Function Tests:  Recent Labs Lab 02/03/16 0253  AST 16  ALT 19  ALKPHOS 61  BILITOT 0.5  PROT 6.5  ALBUMIN 3.6    CBC:  Recent Labs Lab 02/03/16 0253  WBC 8.5  NEUTROABS 5.7  HGB 12.9  HCT 39.9  MCV 100.0  PLT 231    Cardiac Enzymes:  Recent Labs Lab 02/03/16 0253  TROPONINI <0.03    Radiology: Dg Chest 2 View  Result Date: 02/03/2016 CLINICAL DATA:  Worsening shortness of breath and bilateral lower extremity swelling for 1 week. History of hypertension, congestive  failure, atrial fibrillation, smoker EXAM: CHEST  2 VIEW COMPARISON:  None. FINDINGS: Cardiac enlargement with mild central pulmonary vascular congestion. Interstitial pattern to the lungs suggesting early edema. No focal consolidation. No blunting of costophrenic angles. No pneumothorax. Mediastinal contours appear intact. Tortuous aorta. IMPRESSION: Mild cardiac enlargement and pulmonary vascular congestion with diffuse interstitial edema. Electronically Signed   By: Lucienne Capers M.D.   On: 02/03/2016 03:40     ECG: Atrial fib/flutter 108 bpm    Impression and Recommendations  1. Acute on Chronic CHF with pulmonary edema: Review of Care Everywhere revealed that she had normal EF when she was in sinus rhythm but reduced EF of 30 % when she was in atrial fib, in 01/2015. She has  evidence of pulmonary edema on CXR with minimal urine output. Would continue IV lasix for now. Her dry wt appears to be close to 200 lbs despite her saying she is normally 187 lbs. Replace potassium to keep level at 4.0 in CAD patients.   2. Atrial fib: Agree with metoprolol 100 mg. Continue amiodarone and Elquis Would like to get amiodarone level to evaluate her status. Heart rate is not well controlled currently. Metoprolol has been increased to 100 mg this am by Dr. Wyline Copas. INR is not elevated despite being on Eliquis. Concerns for compliance with all of her medications.   3. ASCVD: Per Care Everywhere, she has BMS to RCA with non-obstructive disease in Cx and LAD. Would continue statin, ASA, and BB. Check echo  4. Hypertension: BP is elevated. She is on losartan. She had run out before admission. Would increase dose to 75 mg daily and watch her response with increased dose of metoprolol as well.   5. Hx of ETOH:  She states that she was heavy user in the past drinking a 5th of whiskey each day, but not only drinks beer. There was an issue with continuing anticoagulation in the past records due to this, but she states  she stopped and joined AA. Now "only drinking beer." DT precautions are recommended.   6. Dysfunctional Home situation: Hospitalist is calling in social services.   Signed: Phill Myron. Lawrence NP [place]  02/04/2016, 10:27 AM Co-Sign MD  Patient examined chart reviewed. Reviewed notes from [place] and discussed with patient Agree that home situation is fragile at best. She is unaware of her afib. She indicates compliance With her eliquis as it is free but has not taken other meds. Agree with increased beta blocker For rate control Echo pending. Would not pursue further attempts at conversion unless EF Is poor again. If EF is normal would rate control and stop amiodarone. EF EF is poor will likely  Need to be "reloaded" with amiodarone with consideration for repeat [facility].  Historically echo  With severe LAE and likelihood of maintaining NSR is low.  CHF clearing quickly with diuresis Exam with animated obese white female basilar crackles no murmur on exam and plus one  Edema  Jenkins Rouge

## 2016-02-05 DIAGNOSIS — I5043 Acute on chronic combined systolic (congestive) and diastolic (congestive) heart failure: Secondary | ICD-10-CM | POA: Diagnosis not present

## 2016-02-05 DIAGNOSIS — I5033 Acute on chronic diastolic (congestive) heart failure: Secondary | ICD-10-CM | POA: Diagnosis not present

## 2016-02-05 DIAGNOSIS — I481 Persistent atrial fibrillation: Secondary | ICD-10-CM

## 2016-02-05 DIAGNOSIS — I509 Heart failure, unspecified: Secondary | ICD-10-CM | POA: Diagnosis not present

## 2016-02-05 DIAGNOSIS — I10 Essential (primary) hypertension: Secondary | ICD-10-CM | POA: Diagnosis not present

## 2016-02-05 DIAGNOSIS — I4892 Unspecified atrial flutter: Secondary | ICD-10-CM | POA: Diagnosis not present

## 2016-02-05 LAB — ECHOCARDIOGRAM COMPLETE
Height: 63 in
WEIGHTICAEL: 3312 [oz_av]

## 2016-02-05 LAB — BASIC METABOLIC PANEL
Anion gap: 8 (ref 5–15)
BUN: 15 mg/dL (ref 6–20)
CHLORIDE: 99 mmol/L — AB (ref 101–111)
CO2: 32 mmol/L (ref 22–32)
CREATININE: 0.96 mg/dL (ref 0.44–1.00)
Calcium: 9.5 mg/dL (ref 8.9–10.3)
GFR calc non Af Amer: >60 mL/min (ref >60)
Glucose, Bld: 89 mg/dL (ref 65–99)
POTASSIUM: 3.5 mmol/L (ref 3.5–5.1)
SODIUM: 139 mmol/L (ref 135–145)

## 2016-02-05 LAB — BRAIN NATRIURETIC PEPTIDE: B NATRIURETIC PEPTIDE 5: 320 pg/mL — AB (ref 0.0–100.0)

## 2016-02-05 MED ORDER — SPIRONOLACTONE 25 MG PO TABS
12.5000 mg | ORAL_TABLET | Freq: Every day | ORAL | Status: DC
  Administered 2016-02-05 – 2016-02-08 (×4): 12.5 mg via ORAL
  Filled 2016-02-05 (×4): qty 1

## 2016-02-05 MED ORDER — DILTIAZEM HCL 60 MG PO TABS
60.0000 mg | ORAL_TABLET | Freq: Three times a day (TID) | ORAL | Status: DC
  Administered 2016-02-05 – 2016-02-08 (×10): 60 mg via ORAL
  Filled 2016-02-05 (×10): qty 1

## 2016-02-05 MED ORDER — FUROSEMIDE 10 MG/ML IJ SOLN
40.0000 mg | Freq: Two times a day (BID) | INTRAMUSCULAR | Status: DC
  Administered 2016-02-05 – 2016-02-08 (×6): 40 mg via INTRAVENOUS
  Filled 2016-02-05 (×6): qty 4

## 2016-02-05 MED ORDER — HYDRALAZINE HCL 20 MG/ML IJ SOLN
10.0000 mg | INTRAMUSCULAR | Status: DC | PRN
  Filled 2016-02-05: qty 1

## 2016-02-05 NOTE — Progress Notes (Signed)
PROGRESS NOTE    Rhonda [name]  [id number] DOB: 1955-10-12 DOA: 02/03/2016 PCP: No PCP Per Patient    Brief Narrative:  60 y.o. female, With history of hypertension, hyperlipidemia, CHF, atrial fibrillation on Eliquis, who came to ED with worsening shortness of breath for possible days. Patient also has been having worsening of lower extremity swelling. Patient just moved to [place] from [place], and ran out of her medications including losartan and metoprolol. Patient also takes amiodarone She denied chest pain, no nausea vomiting or diarrhea.  In the ED lab revealed BNP 405, patient is hypertensive. Chest x-ray showed interstitial edema Patient received 40 mg IV Lasix, metoprolol 50 mg by mouth 1 and losartan 50 mg 1  Assessment & Plan:   Principal Problem:   CHF exacerbation ([place]) Active Problems:   CHF (congestive heart failure), NYHA class I (HCC)   Essential hypertension   Atrial fibrillation (HCC)   Atrial flutter (HCC)   Acute on chronic diastolic CHF (congestive heart failure) ([place])   1. Acute CHF exacerbation, likely diastolic 1. 2d echo with EF of 50-55% with mod mitral valve regurg 2. Will continue patient on IV twice a day Lasix 3. Per cardiology, recs to stop amiodarone. Cardiology recommends increasing IV lasix dose as patient still with signs of volume overload 2. Hypertension 1. Blood pressure elevated this AM 2. Continue current regimen consisting of losartan and metoprolol as well as diuretic 3. Spironolactone added by Cardiology 3. Atrial fibrillation 1. Slightly elevated HR this AM 2. We'll continue on apixaban as tolerated for secondary stroke prevention 3. Plan per Cardiology above  DVT prophylaxis: Apixaban Code Status: Full code Family Communication: Pt in room Disposition Plan: Possible discharge in 24-48 hours  Consultants:   Cardiology  Procedures:     Antimicrobials: Anti-infectives    None      Subjective: Pt  without complaints today  Objective: Vitals:   02/04/16 0548 02/04/16 2118 02/05/16 0606 02/05/16 1135  BP: (!) 153/108 (!) 139/100 (!) 145/112 (!) 156/97  Pulse: 99 86 98 85  Resp: 20 20 20    Temp: 98.2 F (36.8 C) 98.1 F (36.7 C) 98.1 F (36.7 C)   TempSrc: Oral Oral Oral   SpO2: 96% 96% 95%   Weight: 93.9 kg (207 lb)  92.5 kg (204 lb)   Height: 5\' 3"  (1.6 m)  5\' 3"  (1.6 m)     Intake/Output Summary (Last 24 hours) at 02/05/16 1305 Last data filed at 02/04/16 1840  Gross per 24 hour  Intake              243 ml  Output                0 ml  Net              243 ml   Filed Weights   02/03/16 0609 02/04/16 0548 02/05/16 0606  Weight: 94.5 kg (208 lb 5.4 oz) 93.9 kg (207 lb) 92.5 kg (204 lb)    Examination:  General exam: Appears calm and comfortable, sitting in bed Respiratory system: Clear to auscultation. Respiratory effort normal. Cardiovascular system: S1 & S2 heard, RRR. Marland Kitchen Gastrointestinal system: Abdomen is nondistended, soft and nontender. No organomegaly or masses felt. Normal bowel sounds heard. Central nervous system: Alert and oriented. No focal neurological deficits. Extremities: Symmetric 5 x 5 power. Skin: No rashes, lesions, no clubbing Psychiatry: Judgement and insight appear normal. Mood & affect appropriate.   Data Reviewed: I have personally reviewed following  labs and imaging studies  CBC:  Recent Labs Lab 02/03/16 0253  WBC 8.5  NEUTROABS 5.7  HGB 12.9  HCT 39.9  MCV 100.0  PLT [id number]   Basic Metabolic Panel:  Recent Labs Lab 02/03/16 0253 02/04/16 0602 02/05/16 0609  NA 142 139 139  K 3.6 3.5 3.5  CL 106 102 99*  CO2 27 30 32  GLUCOSE 106* 96 89  BUN 8 12 15   CREATININE 0.83 0.95 0.96  CALCIUM 9.3 9.0 9.5   GFR: Estimated Creatinine Clearance: 69 mL/min (by C-G formula based on SCr of 0.96 mg/dL). Liver Function Tests:  Recent Labs Lab 02/03/16 0253  AST 16  ALT 19  ALKPHOS 61  BILITOT 0.5  PROT 6.5  ALBUMIN 3.6    No results for input(s): LIPASE, AMYLASE in the last 168 hours. No results for input(s): AMMONIA in the last 168 hours. Coagulation Profile:  Recent Labs Lab 02/03/16 0428  INR 1.11   Cardiac Enzymes:  Recent Labs Lab 02/03/16 0253  TROPONINI <0.03   BNP (last 3 results) No results for input(s): PROBNP in the last 8760 hours. HbA1C: No results for input(s): HGBA1C in the last 72 hours. CBG: No results for input(s): GLUCAP in the last 168 hours. Lipid Profile: No results for input(s): CHOL, HDL, LDLCALC, TRIG, CHOLHDL, LDLDIRECT in the last 72 hours. Thyroid Function Tests: No results for input(s): TSH, T4TOTAL, FREET4, T3FREE, THYROIDAB in the last 72 hours. Anemia Panel: No results for input(s): VITAMINB12, FOLATE, FERRITIN, TIBC, IRON, RETICCTPCT in the last 72 hours. Sepsis Labs: No results for input(s): PROCALCITON, LATICACIDVEN in the last 168 hours.  Recent Results (from the past 240 hour(s))  MRSA PCR Screening     Status: None   Collection Time: 02/03/16  6:33 AM  Result Value Ref Range Status   MRSA by PCR NEGATIVE NEGATIVE Final    Comment:        The GeneXpert MRSA Assay (FDA approved for NASAL specimens only), is one component of a comprehensive MRSA colonization surveillance program. It is not intended to diagnose MRSA infection nor to guide or monitor treatment for MRSA infections.      Radiology Studies: No results found.  Scheduled Meds: . apixaban  5 mg Oral BID  . diltiazem  60 mg Oral [id number]  . folic acid  1 mg Oral Daily  . furosemide  40 mg Intravenous Q12H  . LORazepam  0-4 mg Oral Q6H   Followed by  . [START ON 02/06/2016] LORazepam  0-4 mg Oral Q12H  . losartan  75 mg Oral Daily  . metoprolol tartrate  100 mg Oral Q12H  . multivitamin with minerals  1 tablet Oral Daily  . potassium chloride  20 mEq Oral Daily  . sodium chloride flush  3 mL Intravenous Q12H  . spironolactone  12.5 mg Oral Daily  . thiamine  100 mg Oral Daily    Or  . thiamine  100 mg Intravenous Daily   Continuous Infusions:    LOS: 2 days   CHIU, Orpah Melter, MD [facility] Pager [phone]  If 7PM-7AM, please contact night-coverage [email or web address] Password [email or web address] 02/05/2016, 1:05 PM

## 2016-02-05 NOTE — Progress Notes (Addendum)
Primary Cardiologist: Jenkins Rouge MD  Cardiology Specific Problem List: 1. CAD 2. Atrial fibrillation 3. Pulmonary Edema 4. Hypertension   Subjective:    No complaints of dyspnea or chest pain.   Objective:   Temp:  [98.1 F (36.7 C)] 98.1 F (36.7 C) (09/01 0606) Pulse Rate:  [86-98] 98 (09/01 0606) Resp:  [20] 20 (09/01 0606) BP: (139-145)/(100-112) 145/112 (09/01 0606) SpO2:  [95 %-96 %] 95 % (09/01 0606) Weight:  [204 lb (92.5 kg)] 204 lb (92.5 kg) (09/01 0606) Last BM Date: 02/04/16  Filed Weights   02/03/16 0609 02/04/16 0548 02/05/16 0606  Weight: 208 lb 5.4 oz (94.5 kg) 207 lb (93.9 kg) 204 lb (92.5 kg)    Intake/Output Summary (Last 24 hours) at 02/05/16 0829 Last data filed at 02/04/16 1840  Gross per 24 hour  Intake              486 ml  Output                0 ml  Net              486 ml    Telemetry: Atrial fib with rates up to 100 bpm  Exam:  General: No acute distress.  HEENT: Conjunctiva and lids normal, oropharynx clear.  Lungs: Slight wheezing though not prominent  Cardiac: No elevated JVP or bruits. IRRR, tachycardic, no gallop or rub.   Abdomen: Normoactive bowel sounds, nontender, nondistended.  Extremities: No pitting edema, distal pulses full.  Neuropsychiatric: Alert and oriented x3, affect appropriate.   Lab Results:  Basic Metabolic Panel:  Recent Labs Lab 02/03/16 0253 02/04/16 0602 02/05/16 0609  NA 142 139 139  K 3.6 3.5 3.5  CL 106 102 99*  CO2 27 30 32  GLUCOSE 106* 96 89  BUN 8 12 15   CREATININE 0.83 0.95 0.96  CALCIUM 9.3 9.0 9.5    Liver Function Tests:  Recent Labs Lab 02/03/16 0253  AST 16  ALT 19  ALKPHOS 61  BILITOT 0.5  PROT 6.5  ALBUMIN 3.6    CBC:  Recent Labs Lab 02/03/16 0253  WBC 8.5  HGB 12.9  HCT 39.9  MCV 100.0  PLT 231    Cardiac Enzymes:  Recent Labs Lab 02/03/16 0253  TROPONINI <0.03    Coagulation:  Recent Labs Lab 02/03/16 0428  INR 1.11     Medications:   Scheduled Medications: . amiodarone  200 mg Oral Daily  . apixaban  5 mg Oral BID  . folic acid  1 mg Oral Daily  . furosemide  20 mg Intravenous Q12H  . LORazepam  0-4 mg Oral Q6H   Followed by  . [START ON 02/06/2016] LORazepam  0-4 mg Oral Q12H  . losartan  75 mg Oral Daily  . metoprolol tartrate  100 mg Oral Q12H  . multivitamin with minerals  1 tablet Oral Daily  . potassium chloride  20 mEq Oral Daily  . sodium chloride flush  3 mL Intravenous Q12H  . thiamine  100 mg Oral Daily   Or  . thiamine  100 mg Intravenous Daily         PRN Medications:  sodium chloride, acetaminophen, hydrALAZINE, LORazepam **OR** LORazepam, ondansetron (ZOFRAN) IV, sodium chloride flush   Assessment and Plan:   1. CAD: Hx of BMS to RCA in 2011, with non-obstructive disease in CX and LAD per Care Everywhere. Will plan OP stress testing for evaluation of progressive CAD in the  setting of ongoing tobacco abuse, hypercholesterolemia, and hypertension. This may change if Echo reveal significant changes in LV function.   2. Atrial fibrillation:  Heart rate is not well controlled. Review of telemetry demonstrates HR in the 90's-104 bpm. She is on metoprolol 100 mg BID. Awaiting echo to be read. Has been completed. Continue Eliquis, Amiodarone. May need to adjust medications once results are reviewed. Last echo per Care Everywhere EF  Of 65% when in NSR, with EF of 30% with afib.   3. Hypertension: BP remains elevated. Diastolic levels remain high. She is on lasix but likely has significant diastolic dysfunction.  Now on metoprolol, losartan, will add spironolactone 12.5 mg, creatinine 0.96.   4. Pulmonary Edema:  I/O is minimal compared to the amount of lasix she is getting. May need to adjust this. She is better today concerning breathing status and edema.     Phill Myron. Lawrence NP [place]  02/05/2016, 8:29 AM   Pt seen and examined  I agree with findingas as noted above by Arnold Long  Echo yesterday LVEF was 50 to 55%  LAE severelyy  Moderate MR Tele shows Afib  Rates in 90s   I agree with rate control  Will d/c amio  She does not sense On exam:  Neck full  Lungs mild rales  Ext with triv edema  I would continue diuresis  Increase IV lasix  Strict I/O contine metoprolol and Eliquis BP is not controlled  Would add dilt 60 tid   Follow   Note that pt got meds in [place]  Does not have medicaid her  Will be able to provide her with some samples until coverage clarified    Dorris Carnes

## 2016-02-05 NOTE — Care Management Note (Signed)
Case Management Note  Patient Details  Name: Rhonda [name] MRN: [id number] Date of Birth: [birth year]  Expected Discharge Date:    02/05/2016              Expected Discharge Plan:  Home/Self Care  In-House Referral:  Financial Counselor  Discharge planning Services  CM Consult, Follow-up appt scheduled, Medication Assistance, [facility]  Post Acute Care Choice:  NA Choice offered to:  NA  DME Arranged:    DME Agency:     HH Arranged:    HH Agency:     Status of Service:  Completed, signed off  If discussed at [employer], dates discussed:    Additional Comments: [place] home over weekend. Per FC she is not eligible for [place] Medicaid. She has been made f/u appointment with cardiology and [facility]. She will be given [facility] voucher for 30-day supply of medications. She is aware she is only eligible to use this form of assistance once a year. She is contemplating moving back to [place].  Rhonda Barge, RN 02/05/2016, 12:11 PM

## 2016-02-06 DIAGNOSIS — I509 Heart failure, unspecified: Secondary | ICD-10-CM | POA: Diagnosis not present

## 2016-02-06 DIAGNOSIS — I10 Essential (primary) hypertension: Secondary | ICD-10-CM | POA: Diagnosis not present

## 2016-02-06 LAB — BASIC METABOLIC PANEL
Anion gap: 6 (ref 5–15)
BUN: 18 mg/dL (ref 6–20)
CHLORIDE: 99 mmol/L — AB (ref 101–111)
CO2: 32 mmol/L (ref 22–32)
CREATININE: 0.97 mg/dL (ref 0.44–1.00)
Calcium: 9.2 mg/dL (ref 8.9–10.3)
GFR calc Af Amer: >60 mL/min (ref >60)
GFR calc non Af Amer: >60 mL/min (ref >60)
Glucose, Bld: 110 mg/dL — ABNORMAL HIGH (ref 65–99)
Potassium: 3.5 mmol/L (ref 3.5–5.1)
SODIUM: 137 mmol/L (ref 135–145)

## 2016-02-06 NOTE — Progress Notes (Addendum)
PROGRESS NOTE    Rhonda [name]  [id number] DOB: [birth year] DOA: 02/03/2016 PCP: No PCP Per Patient    Brief Narrative:  60 y.o. female, With history of hypertension, hyperlipidemia, CHF, atrial fibrillation on Eliquis, who came to ED with worsening shortness of breath for possible days. Patient also has been having worsening of lower extremity swelling. Patient just moved to [place] from [place], and ran out of her medications including losartan and metoprolol. Patient also takes amiodarone She denied chest pain, no nausea vomiting or diarrhea.  In the ED lab revealed BNP 405, patient is hypertensive. Chest x-ray showed interstitial edema Patient received 40 mg IV Lasix, metoprolol 50 mg by mouth 1 and losartan 50 mg 1  Assessment & Plan:   Principal Problem:   Acute on chronic congestive heart failure (HCC) Active Problems:   CHF (congestive heart failure), NYHA class I (HCC)   Essential hypertension   Atrial fibrillation (HCC)   Atrial flutter (HCC)   Acute on chronic diastolic CHF (congestive heart failure) ([place])   1. Acute CHF exacerbation, likely diastolic 1. 2d echo with EF of 50-55% with mod mitral valve regurg 2. Will continue patient on current IV twice a day Lasix 3. Patient noted to have approximately 1 pound weight gain overnight 4. Will place patient on fluid restricted diet, continue current dose of Lasix 5. Follow renal function 2. Hypertension 1. Blood pressure elevated this AM 2. Continue current regimen consisting of losartan and metoprolol as well as diuretic 3. Spironolactone added by Cardiology 4. Blood pressure improved today 3. Chronic Atrial fibrillation 1. CHADS-VASc of at least 3 (CHF, HTN, female) 2. Slightly elevated HR this admission 3. We'll continue on apixaban as tolerated for secondary stroke prevention 4. Plan per Cardiology above 5. Heart rate improved 4. History of alcohol abuse 1. Patient reportedly continues to drink  alcohol prior to admission 2. Patient had been continued on alcohol withdrawal protocol 3. CIWA noted to be 0  DVT prophylaxis: Apixaban Code Status: Full code Family Communication: Pt in room Disposition Plan: Possible discharge in 24-48 hours  Consultants:   Cardiology  Procedures:     Antimicrobials: Anti-infectives    None      Subjective: Patient questioning about going home. Denies chest pain or shortness of breath  Objective: Vitals:   02/05/16 1135 02/05/16 1537 02/05/16 2116 02/06/16 0507  BP: (!) 156/97 (!) 140/94 116/89 130/83  Pulse: 85 (!) 109 (!) 110 96  Resp:  20 20 20   Temp:  97.7 F (36.5 C) 98.5 F (36.9 C) 98.4 F (36.9 C)  TempSrc:  Oral Oral Oral  SpO2:  98% 96% 92%  Weight:    93.1 kg (205 lb 4.8 oz)  Height:        Intake/Output Summary (Last 24 hours) at 02/06/16 0859 Last data filed at 02/06/16 0509  Gross per 24 hour  Intake             2043 ml  Output             3100 ml  Net            -1057 ml   Filed Weights   02/04/16 0548 02/05/16 0606 02/06/16 0507  Weight: 93.9 kg (207 lb) 92.5 kg (204 lb) 93.1 kg (205 lb 4.8 oz)    Examination:  General exam: Appears calm and comfortable, sitting in bed Respiratory system: Clear to auscultation. Respiratory effort normal. Cardiovascular system: S1 & S2 heard, RRR. Marland Kitchen Gastrointestinal system:  Abdomen is nondistended, soft and nontender. No organomegaly or masses felt. Normal bowel sounds heard. Central nervous system: Alert and oriented. No focal neurological deficits. Extremities: Symmetric 5 x 5 power. Skin: No rashes, lesions, no clubbing Psychiatry: Judgement and insight appear normal. Mood & affect appropriate.   Data Reviewed: I have personally reviewed following labs and imaging studies  CBC:  Recent Labs Lab 02/03/16 0253  WBC 8.5  NEUTROABS 5.7  HGB 12.9  HCT 39.9  MCV 100.0  PLT [id number]   Basic Metabolic Panel:  Recent Labs Lab 02/03/16 0253 02/04/16 0602  02/05/16 0609 02/06/16 0613  NA 142 139 139 137  K 3.6 3.5 3.5 3.5  CL 106 102 99* 99*  CO2 27 30 32 32  GLUCOSE 106* 96 89 110*  BUN 8 12 15 18   CREATININE 0.83 0.95 0.96 0.97  CALCIUM 9.3 9.0 9.5 9.2   GFR: Estimated Creatinine Clearance: 68.6 mL/min (by C-G formula based on SCr of 0.97 mg/dL). Liver Function Tests:  Recent Labs Lab 02/03/16 0253  AST 16  ALT 19  ALKPHOS 61  BILITOT 0.5  PROT 6.5  ALBUMIN 3.6   No results for input(s): LIPASE, AMYLASE in the last 168 hours. No results for input(s): AMMONIA in the last 168 hours. Coagulation Profile:  Recent Labs Lab 02/03/16 0428  INR 1.11   Cardiac Enzymes:  Recent Labs Lab 02/03/16 0253  TROPONINI <0.03   BNP (last 3 results) No results for input(s): PROBNP in the last 8760 hours. HbA1C: No results for input(s): HGBA1C in the last 72 hours. CBG: No results for input(s): GLUCAP in the last 168 hours. Lipid Profile: No results for input(s): CHOL, HDL, LDLCALC, TRIG, CHOLHDL, LDLDIRECT in the last 72 hours. Thyroid Function Tests: No results for input(s): TSH, T4TOTAL, FREET4, T3FREE, THYROIDAB in the last 72 hours. Anemia Panel: No results for input(s): VITAMINB12, FOLATE, FERRITIN, TIBC, IRON, RETICCTPCT in the last 72 hours. Sepsis Labs: No results for input(s): PROCALCITON, LATICACIDVEN in the last 168 hours.  Recent Results (from the past 240 hour(s))  MRSA PCR Screening     Status: None   Collection Time: 02/03/16  6:33 AM  Result Value Ref Range Status   MRSA by PCR NEGATIVE NEGATIVE Final    Comment:        The GeneXpert MRSA Assay (FDA approved for NASAL specimens only), is one component of a comprehensive MRSA colonization surveillance program. It is not intended to diagnose MRSA infection nor to guide or monitor treatment for MRSA infections.      Radiology Studies: No results found.  Scheduled Meds: . apixaban  5 mg Oral BID  . diltiazem  60 mg Oral [id number]  . folic acid  1 mg  Oral Daily  . furosemide  40 mg Intravenous Q12H  . LORazepam  0-4 mg Oral Q6H   Followed by  . LORazepam  0-4 mg Oral Q12H  . losartan  75 mg Oral Daily  . metoprolol tartrate  100 mg Oral Q12H  . multivitamin with minerals  1 tablet Oral Daily  . potassium chloride  20 mEq Oral Daily  . sodium chloride flush  3 mL Intravenous Q12H  . spironolactone  12.5 mg Oral Daily  . thiamine  100 mg Oral Daily   Or  . thiamine  100 mg Intravenous Daily   Continuous Infusions:    LOS: 3 days   Rhilynn Preyer, Orpah Melter, MD [facility] Pager [phone]  If 7PM-7AM, please contact night-coverage [email or web address] Password  [email or web address] 02/06/2016, 8:59 AM

## 2016-02-07 DIAGNOSIS — I5033 Acute on chronic diastolic (congestive) heart failure: Secondary | ICD-10-CM | POA: Diagnosis not present

## 2016-02-07 DIAGNOSIS — I10 Essential (primary) hypertension: Secondary | ICD-10-CM | POA: Diagnosis not present

## 2016-02-07 LAB — BASIC METABOLIC PANEL
Anion gap: 6 (ref 5–15)
BUN: 21 mg/dL — AB (ref 6–20)
CHLORIDE: 100 mmol/L — AB (ref 101–111)
CO2: 32 mmol/L (ref 22–32)
CREATININE: 1.01 mg/dL — AB (ref 0.44–1.00)
Calcium: 9.4 mg/dL (ref 8.9–10.3)
GFR calc Af Amer: >60 mL/min (ref >60)
GFR calc non Af Amer: >60 mL/min (ref >60)
GLUCOSE: 101 mg/dL — AB (ref 65–99)
POTASSIUM: 4.2 mmol/L (ref 3.5–5.1)
Sodium: 138 mmol/L (ref 135–145)

## 2016-02-07 NOTE — Progress Notes (Signed)
Notified MD, pt's blood pressure is running in th e100s/70s.  Pt receiving Metoprolol an Cardizem. Ordered to still give both. Will continue to monitor pt.

## 2016-02-07 NOTE — Progress Notes (Signed)
PROGRESS NOTE    Rhonda [name]  [id number] DOB: [birth year] DOA: 02/03/2016 PCP: No PCP Per Patient    Brief Narrative:  60 y.o. female, With history of hypertension, hyperlipidemia, CHF, atrial fibrillation on Eliquis, who came to ED with worsening shortness of breath for possible days. Patient also has been having worsening of lower extremity swelling. Patient just moved to [place] from [place], and ran out of her medications including losartan and metoprolol. Patient also takes amiodarone She denied chest pain, no nausea vomiting or diarrhea.  In the ED lab revealed BNP 405, patient is hypertensive. Chest x-ray showed interstitial edema Patient received 40 mg IV Lasix, metoprolol 50 mg by mouth 1 and losartan 50 mg 1  Assessment & Plan:   Principal Problem:   Acute on chronic congestive heart failure (HCC) Active Problems:   CHF (congestive heart failure), NYHA class I (HCC)   Essential hypertension   Atrial fibrillation (HCC)   Atrial flutter (HCC)   Acute on chronic diastolic CHF (congestive heart failure) ([place])   1. Acute CHF exacerbation, likely diastolic 1. 2d echo with EF of 50-55% with mod mitral valve regurg 2. Patient continued on 40mg  IV twice a day Lasix 3. Patient noted to have approximately around 2 pound wt loss overnight with fluid restricted diet 4. BUN is trending up, Cr now 1. 5. Continue to monitor renal function closely 2. Hypertension 1. Blood pressure stable 2. Continue current regimen consisting of losartan and metoprolol as well as diuretic 3. Spironolactone was added by Cardiology 3. Chronic Atrial fibrillation 1. CHADS-VASc of at least 3 (CHF, HTN, female) 2. Slightly elevated HR this admission 3. We'll continue on apixaban as tolerated for secondary stroke prevention 4. Plan per Cardiology above 5. Heart rate improved and stable 4. History of alcohol abuse 1. Patient reportedly continues to drink alcohol prior to  admission 2. Patient had been continued on alcohol withdrawal protocol 3. CIWA noted to be 0 today  DVT prophylaxis: Apixaban Code Status: Full code Family Communication: Pt in room Disposition Plan: Possible discharge in 24-48 hours  Consultants:   Cardiology  Procedures:     Antimicrobials: Anti-infectives    None      Subjective: Pleasant without complaints. Ambulating well.  Objective: Vitals:   02/06/16 1300 02/06/16 2137 02/07/16 0512 02/07/16 0940  BP: 133/90 (!) 122/91 119/89 113/64  Pulse: 95 94 79 62  Resp: 18 20 20    Temp: 98.4 F (36.9 C) 99.4 F (37.4 C) 97.8 F (36.6 C)   TempSrc: Oral Oral Oral   SpO2: 93% 97% 95%   Weight:   92.4 kg (203 lb 12.8 oz)   Height:        Intake/Output Summary (Last 24 hours) at 02/07/16 1211 Last data filed at 02/07/16 0500  Gross per 24 hour  Intake              240 ml  Output             2600 ml  Net            -2360 ml   Filed Weights   02/05/16 0606 02/06/16 0507 02/07/16 0512  Weight: 92.5 kg (204 lb) 93.1 kg (205 lb 4.8 oz) 92.4 kg (203 lb 12.8 oz)    Examination:  General exam: Appears calm and comfortable, ambulating in hallway Respiratory system: Clear to auscultation. Respiratory effort normal, no crackles Cardiovascular system: S1 & S2 heard, RRR. Marland Kitchen Gastrointestinal system: Abdomen is nondistended, soft and nontender.  No organomegaly or masses felt. Normal bowel sounds heard. Central nervous system: Alert and oriented. No focal neurological deficits. Extremities: Symmetric 5 x 5 power. Skin: No rashes, lesions, no clubbing Psychiatry: Judgement and insight appear normal. Mood & affect appropriate.   Data Reviewed: I have personally reviewed following labs and imaging studies  CBC:  Recent Labs Lab 02/03/16 0253  WBC 8.5  NEUTROABS 5.7  HGB 12.9  HCT 39.9  MCV 100.0  PLT [id number]   Basic Metabolic Panel:  Recent Labs Lab 02/03/16 0253 02/04/16 0602 02/05/16 0609 02/06/16 0613  02/07/16 0552  NA 142 139 139 137 138  K 3.6 3.5 3.5 3.5 4.2  CL 106 102 99* 99* 100*  CO2 27 30 32 32 32  GLUCOSE 106* 96 89 110* 101*  BUN 8 12 15 18  21*  CREATININE 0.83 0.95 0.96 0.97 1.01*  CALCIUM 9.3 9.0 9.5 9.2 9.4   GFR: Estimated Creatinine Clearance: 65.6 mL/min (by C-G formula based on SCr of 1.01 mg/dL). Liver Function Tests:  Recent Labs Lab 02/03/16 0253  AST 16  ALT 19  ALKPHOS 61  BILITOT 0.5  PROT 6.5  ALBUMIN 3.6   No results for input(s): LIPASE, AMYLASE in the last 168 hours. No results for input(s): AMMONIA in the last 168 hours. Coagulation Profile:  Recent Labs Lab 02/03/16 0428  INR 1.11   Cardiac Enzymes:  Recent Labs Lab 02/03/16 0253  TROPONINI <0.03   BNP (last 3 results) No results for input(s): PROBNP in the last 8760 hours. HbA1C: No results for input(s): HGBA1C in the last 72 hours. CBG: No results for input(s): GLUCAP in the last 168 hours. Lipid Profile: No results for input(s): CHOL, HDL, LDLCALC, TRIG, CHOLHDL, LDLDIRECT in the last 72 hours. Thyroid Function Tests: No results for input(s): TSH, T4TOTAL, FREET4, T3FREE, THYROIDAB in the last 72 hours. Anemia Panel: No results for input(s): VITAMINB12, FOLATE, FERRITIN, TIBC, IRON, RETICCTPCT in the last 72 hours. Sepsis Labs: No results for input(s): PROCALCITON, LATICACIDVEN in the last 168 hours.  Recent Results (from the past 240 hour(s))  MRSA PCR Screening     Status: None   Collection Time: 02/03/16  6:33 AM  Result Value Ref Range Status   MRSA by PCR NEGATIVE NEGATIVE Final    Comment:        The GeneXpert MRSA Assay (FDA approved for NASAL specimens only), is one component of a comprehensive MRSA colonization surveillance program. It is not intended to diagnose MRSA infection nor to guide or monitor treatment for MRSA infections.      Radiology Studies: No results found.  Scheduled Meds: . apixaban  5 mg Oral BID  . diltiazem  60 mg Oral [id number]   . folic acid  1 mg Oral Daily  . furosemide  40 mg Intravenous Q12H  . LORazepam  0-4 mg Oral Q12H  . losartan  75 mg Oral Daily  . metoprolol tartrate  100 mg Oral Q12H  . multivitamin with minerals  1 tablet Oral Daily  . potassium chloride  20 mEq Oral Daily  . sodium chloride flush  3 mL Intravenous Q12H  . spironolactone  12.5 mg Oral Daily  . thiamine  100 mg Oral Daily   Or  . thiamine  100 mg Intravenous Daily   Continuous Infusions:    LOS: 4 days   Legend Pecore, Orpah Melter, MD [facility] Pager [phone]  If 7PM-7AM, please contact night-coverage [email or web address] Password [email or web address] 02/07/2016, 12:11 PM

## 2016-02-08 DIAGNOSIS — I5033 Acute on chronic diastolic (congestive) heart failure: Secondary | ICD-10-CM | POA: Diagnosis not present

## 2016-02-08 DIAGNOSIS — I10 Essential (primary) hypertension: Secondary | ICD-10-CM | POA: Diagnosis not present

## 2016-02-08 LAB — BASIC METABOLIC PANEL
Anion gap: 7 (ref 5–15)
BUN: 24 mg/dL — AB (ref 6–20)
CHLORIDE: 100 mmol/L — AB (ref 101–111)
CO2: 30 mmol/L (ref 22–32)
Calcium: 9.4 mg/dL (ref 8.9–10.3)
Creatinine, Ser: 1.09 mg/dL — ABNORMAL HIGH (ref 0.44–1.00)
GFR calc Af Amer: >60 mL/min (ref >60)
GFR calc non Af Amer: 55 mL/min — ABNORMAL LOW (ref >60)
GLUCOSE: 98 mg/dL (ref 65–99)
POTASSIUM: 4.1 mmol/L (ref 3.5–5.1)
SODIUM: 137 mmol/L (ref 135–145)

## 2016-02-08 MED ORDER — APIXABAN 5 MG PO TABS: 5.0000 mg | ORAL_TABLET | Freq: Two times a day (BID) | ORAL | 0 refills | Status: DC

## 2016-02-08 MED ORDER — POTASSIUM CHLORIDE CRYS ER 20 MEQ PO TBCR: 20.0000 meq | EXTENDED_RELEASE_TABLET | Freq: Every day | ORAL | 0 refills | Status: DC

## 2016-02-08 MED ORDER — FUROSEMIDE 40 MG PO TABS: 40.0000 mg | ORAL_TABLET | Freq: Every day | ORAL | 0 refills | Status: DC

## 2016-02-08 MED ORDER — DILTIAZEM HCL 60 MG PO TABS: 60.0000 mg | ORAL_TABLET | Freq: Three times a day (TID) | ORAL | 0 refills | Status: DC

## 2016-02-08 MED ORDER — METOPROLOL TARTRATE 100 MG PO TABS: 100.0000 mg | ORAL_TABLET | Freq: Two times a day (BID) | ORAL | 0 refills | Status: DC

## 2016-02-08 MED ORDER — SPIRONOLACTONE 25 MG PO TABS: 12.5000 mg | ORAL_TABLET | Freq: Every day | ORAL | 0 refills | Status: DC

## 2016-02-08 MED ORDER — LOSARTAN POTASSIUM 25 MG PO TABS: 75.0000 mg | ORAL_TABLET | Freq: Every day | ORAL | 0 refills | Status: DC

## 2016-02-08 MED ORDER — ATORVASTATIN CALCIUM 20 MG PO TABS
20.0000 mg | ORAL_TABLET | Freq: Every day | ORAL | 0 refills | Status: DC
Start: 2016-02-08 — End: 2019-06-05

## 2016-02-08 NOTE — Progress Notes (Signed)
Discharged PT per MD order and protocol. Discharge handouts reviewed/explained. Education completed.  Pt verbalized understanding and left with all belongings. VSS. IV catheter D/C.  Prescriptions given and explained. Patient wheeled down by staff member.

## 2016-02-08 NOTE — Discharge Instructions (Signed)
Heart Failure  Heart failure means your heart has trouble pumping blood. This makes it hard for your body to work well. Heart failure is usually a long-term (chronic) condition. You must take good care of yourself and follow your doctor's treatment plan.  HOME CARE   Take your heart medicine as told by your doctor.    Do not stop taking medicine unless your doctor tells you to.    Do not skip any dose of medicine.    Refill your medicines before they run out.    Take other medicines only as told by your doctor or pharmacist.   Stay active if told by your doctor. The elderly and people with severe heart failure should talk with a doctor about physical activity.   Eat heart-healthy foods. Choose foods that are without trans fat and are low in saturated fat, cholesterol, and salt (sodium). This includes fresh or frozen fruits and vegetables, fish, lean meats, fat-free or low-fat dairy foods, whole grains, and high-fiber foods. Lentils and dried peas and beans (legumes) are also good choices.   Limit salt if told by your doctor.   Cook in a healthy way. Roast, grill, broil, bake, poach, steam, or stir-fry foods.   Limit fluids as told by your doctor.   Weigh yourself every morning. Do this after you pee (urinate) and before you eat breakfast. Write down your weight to give to your doctor.   Take your blood pressure and write it down if your doctor tells you to.   Ask your doctor how to check your pulse. Check your pulse as told.   Lose weight if told by your doctor.   Stop smoking or chewing tobacco. Do not use gum or patches that help you quit without your doctor's approval.   Schedule and go to doctor visits as told.   Nonpregnant women should have no more than 1 drink a day. Men should have no more than 2 drinks a day. Talk to your doctor about drinking alcohol.   Stop illegal drug use.   Stay current with shots (immunizations).   Manage your health conditions as told by your doctor.   Learn to  manage your stress.   Rest when you are tired.   If it is really hot outside:    Avoid intense activities.    Use air conditioning or fans, or get in a cooler place.    Avoid caffeine and alcohol.    Wear loose-fitting, lightweight, and light-colored clothing.   If it is really cold outside:    Avoid intense activities.    Layer your clothing.    Wear mittens or gloves, a hat, and a scarf when going outside.    Avoid alcohol.   Learn about heart failure and get support as needed.   Get help to maintain or improve your quality of life and your ability to care for yourself as needed.  GET HELP IF:    You gain weight quickly.   You are more short of breath than usual.   You cannot do your normal activities.   You tire easily.   You cough more than normal, especially with activity.   You have any or more puffiness (swelling) in areas such as your hands, feet, ankles, or belly (abdomen).   You cannot sleep because it is hard to breathe.   You feel like your heart is beating fast (palpitations).   You get dizzy or light-headed when you stand up.  GET HELP   RIGHT AWAY IF:    You have trouble breathing.   There is a change in mental status, such as becoming less alert or not being able to focus.   You have chest pain or discomfort.   You faint.  MAKE SURE YOU:    Understand these instructions.   Will watch your condition.   Will get help right away if you are not doing well or get worse.     This information is not intended to replace advice given to you by your health care provider. Make sure you discuss any questions you have with your health care provider.     Document Released: 03/01/2008 Document Revised: 06/13/2014 Document Reviewed: 07/09/2012  [employer]vier Interactive Patient Education 2016 [employer]

## 2016-02-08 NOTE — Discharge Summary (Signed)
Physician Discharge Summary  Rhonda [name] [id number] DOB: [birth year] DOA: 02/03/2016  PCP: No PCP Per Patient  Admit date: 02/03/2016 Discharge date: 02/08/2016  Admitted From: Home Disposition:  Home  Recommendations for Outpatient Follow-up:  1. Follow up with PCP in 1-2 weeks   Discharge Condition:Improved CODE STATUS:Full Diet recommendation: Heart healthy   Brief/Interim Summary: 60 y.o.female,With history of hypertension, hyperlipidemia, CHF, atrial fibrillation on Eliquis, who came to ED with worsening shortness of breath for possible days. Patient also has been having worsening of lower extremity swelling. Patient just moved to [place] from [place], and ran out of her medications including losartan and metoprolol.Patient also takes amiodarone She denied chest pain, no nausea vomiting or diarrhea.  1. Acute CHF exacerbation, likely diastolic 1. 2d echo with EF of 50-55% with mod mitral valve regurg 2. Patient was continued on 40mg  IV twice a day Lasix with 1500cc fluid restricted diet 3. Clinically much improved by day of discharge 4. BUN increased to 24 by day of discharge 5. Patient to continue PO lasix on discharge  2. Hypertension 1. Blood pressure initially poorly controlled 2. Continued current regimen consisting of losartan and metoprolol as well as diuretic 3. Spironolactone was added by Cardiology 4. BP improved 3. Chronic Atrial fibrillation 1. CHADS-VASc of at least 3 (CHF, HTN, female) 2. Slightly elevated HR this admission 3. Continued on apixaban as tolerated for secondary stroke prevention 4. Heart rate improved and remained stable 5. CM assisted patient with obtaining 30 days of medications on discharge 4. History of alcohol abuse 1. Patient reportedly continues to drink alcohol prior to admission 2. Patient had been continued on alcohol withdrawal protocol 3. CIWA noted to be minimal this admission  Discharge Diagnoses:  Principal  Problem:   Acute on chronic congestive heart failure (HCC) Active Problems:   CHF (congestive heart failure), NYHA class I (HCC)   Essential hypertension   Atrial fibrillation (HCC)   Atrial flutter (HCC)   Acute on chronic diastolic CHF (congestive heart failure) ([place])    Discharge Instructions     Medication List    STOP taking these medications   amiodarone 200 MG tablet Commonly known as:  PACERONE   aspirin EC 81 MG tablet     TAKE these medications   atorvastatin 20 MG tablet Commonly known as:  LIPITOR Take 1 tablet (20 mg total) by mouth daily.   diltiazem 60 MG tablet Commonly known as:  CARDIZEM Take 1 tablet (60 mg total) by mouth every 8 (eight) hours.   ELIQUIS 5 MG Tabs tablet Generic drug:  apixaban Take 5 mg by mouth 2 (two) times daily.   furosemide 40 MG tablet Commonly known as:  LASIX Take 1 tablet (40 mg total) by mouth daily.   losartan 25 MG tablet Commonly known as:  COZAAR Take 3 tablets (75 mg total) by mouth daily. What changed:  medication strength  how much to take   metoprolol 100 MG tablet Commonly known as:  LOPRESSOR Take 1 tablet (100 mg total) by mouth every 12 (twelve) hours. What changed:  when to take this   potassium chloride SA 20 MEQ tablet Commonly known as:  K-DUR,KLOR-CON Take 1 tablet (20 mEq total) by mouth daily.   spironolactone 25 MG tablet Commonly known as:  ALDACTONE Take 0.5 tablets (12.5 mg total) by mouth daily.      Follow-up Information    [facility] Follow up on 02/25/2016.   Why:  [facility]  [address] [place], [place] [address] [phone] appointment time is 2pm          Allergies  Allergen Reactions  . Ace Inhibitors Cough    Consultations:  Cardiology  Procedures/Studies: Dg Chest 2 View  Result Date: 02/03/2016 CLINICAL DATA:  Worsening shortness of breath and bilateral lower extremity swelling for 1 week. History of hypertension,  congestive failure, atrial fibrillation, smoker EXAM: CHEST  2 VIEW COMPARISON:  None. FINDINGS: Cardiac enlargement with mild central pulmonary vascular congestion. Interstitial pattern to the lungs suggesting early edema. No focal consolidation. No blunting of costophrenic angles. No pneumothorax. Mediastinal contours appear intact. Tortuous aorta. IMPRESSION: Mild cardiac enlargement and pulmonary vascular congestion with diffuse interstitial edema. Electronically Signed   By: Lucienne Capers M.D.   On: 02/03/2016 03:40    Subjective: No complaints. Eager to go home  Discharge Exam: Vitals:   02/08/16 0545 02/08/16 1320  BP: (!) 118/92 (!) 126/93  Pulse: 85 98  Resp: 20 (!) 98  Temp: 98.1 F (36.7 C)    Vitals:   02/07/16 1528 02/07/16 2121 02/08/16 0545 02/08/16 1320  BP:  106/78 (!) 118/92 (!) 126/93  Pulse: 89 74 85 98  Resp:  20 20 (!) 98  Temp:  98.2 F (36.8 C) 98.1 F (36.7 C)   TempSrc:  Oral Oral   SpO2:  95% 99% 97%  Weight:   92.8 kg (204 lb 8 oz)   Height:        General: Pt is alert, awake, not in acute distress Cardiovascular: RRR, S1/S2 +, no rubs, no gallops Respiratory: CTA bilaterally, no wheezing, no rhonchi Abdominal: Soft, NT, ND, bowel sounds + Extremities: no edema, no cyanosis   The results of significant diagnostics from this hospitalization (including imaging, microbiology, ancillary and laboratory) are listed below for reference.     Microbiology: Recent Results (from the past 240 hour(s))  MRSA PCR Screening     Status: None   Collection Time: 02/03/16  6:33 AM  Result Value Ref Range Status   MRSA by PCR NEGATIVE NEGATIVE Final    Comment:        The GeneXpert MRSA Assay (FDA approved for NASAL specimens only), is one component of a comprehensive MRSA colonization surveillance program. It is not intended to diagnose MRSA infection nor to guide or monitor treatment for MRSA infections.      Labs: BNP (last 3 results)  Recent  Labs  02/03/16 0253 02/05/16 0609  BNP 405.0* [id number]*   Basic Metabolic Panel:  Recent Labs Lab 02/04/16 0602 02/05/16 0609 02/06/16 0613 02/07/16 0552 02/08/16 0548  NA 139 139 137 138 137  K 3.5 3.5 3.5 4.2 4.1  CL 102 99* 99* 100* 100*  CO2 30 32 32 32 30  GLUCOSE 96 89 110* 101* 98  BUN 12 15 18  21* 24*  CREATININE 0.95 0.96 0.97 1.01* 1.09*  CALCIUM 9.0 9.5 9.2 9.4 9.4   Liver Function Tests:  Recent Labs Lab 02/03/16 0253  AST 16  ALT 19  ALKPHOS 61  BILITOT 0.5  PROT 6.5  ALBUMIN 3.6   No results for input(s): LIPASE, AMYLASE in the last 168 hours. No results for input(s): AMMONIA in the last 168 hours. CBC:  Recent Labs Lab 02/03/16 0253  WBC 8.5  NEUTROABS 5.7  HGB 12.9  HCT 39.9  MCV 100.0  PLT 231   Cardiac Enzymes:  Recent Labs Lab 02/03/16 0253  TROPONINI <0.03   BNP: Invalid input(s): POCBNP CBG:  No results for input(s): GLUCAP in the last 168 hours. D-Dimer No results for input(s): DDIMER in the last 72 hours. Hgb A1c No results for input(s): HGBA1C in the last 72 hours. Lipid Profile No results for input(s): CHOL, HDL, LDLCALC, TRIG, CHOLHDL, LDLDIRECT in the last 72 hours. Thyroid function studies No results for input(s): TSH, T4TOTAL, T3FREE, THYROIDAB in the last 72 hours.  Invalid input(s): FREET3 Anemia work up No results for input(s): VITAMINB12, FOLATE, FERRITIN, TIBC, IRON, RETICCTPCT in the last 72 hours. Urinalysis No results found for: COLORURINE, APPEARANCEUR, [place], [place], [place], [place], [place], [place], PROTEINUR, UROBILINOGEN, NITRITE, LEUKOCYTESUR Sepsis Labs Invalid input(s): PROCALCITONIN,  WBC,  LACTICIDVEN Microbiology Recent Results (from the past 240 hour(s))  MRSA PCR Screening     Status: None   Collection Time: 02/03/16  6:33 AM  Result Value Ref Range Status   MRSA by PCR NEGATIVE NEGATIVE Final    Comment:        The GeneXpert MRSA Assay (FDA approved for NASAL specimens only),  is one component of a comprehensive MRSA colonization surveillance program. It is not intended to diagnose MRSA infection nor to guide or monitor treatment for MRSA infections.      SIGNED:   Donne Hazel, MD  [facility] 02/08/2016, 5:32 PM  If 7PM-7AM, please contact night-coverage [email or web address] Password [email or web address]

## 2017-09-14 DIAGNOSIS — I509 Heart failure, unspecified: Secondary | ICD-10-CM | POA: Diagnosis not present

## 2017-09-14 DIAGNOSIS — E785 Hyperlipidemia, unspecified: Secondary | ICD-10-CM | POA: Diagnosis not present

## 2017-09-14 DIAGNOSIS — E669 Obesity, unspecified: Secondary | ICD-10-CM | POA: Diagnosis not present

## 2017-09-14 DIAGNOSIS — I10 Essential (primary) hypertension: Secondary | ICD-10-CM | POA: Diagnosis not present

## 2017-09-29 DIAGNOSIS — I509 Heart failure, unspecified: Secondary | ICD-10-CM | POA: Diagnosis not present

## 2017-12-14 DIAGNOSIS — I509 Heart failure, unspecified: Secondary | ICD-10-CM | POA: Diagnosis not present

## 2017-12-14 DIAGNOSIS — I340 Nonrheumatic mitral (valve) insufficiency: Secondary | ICD-10-CM | POA: Diagnosis not present

## 2017-12-14 DIAGNOSIS — M5030 Other cervical disc degeneration, unspecified cervical region: Secondary | ICD-10-CM | POA: Diagnosis not present

## 2017-12-14 DIAGNOSIS — I2510 Atherosclerotic heart disease of native coronary artery without angina pectoris: Secondary | ICD-10-CM | POA: Diagnosis not present

## 2017-12-28 DIAGNOSIS — I509 Heart failure, unspecified: Secondary | ICD-10-CM | POA: Diagnosis not present

## 2017-12-28 DIAGNOSIS — I4891 Unspecified atrial fibrillation: Secondary | ICD-10-CM | POA: Diagnosis not present

## 2017-12-28 DIAGNOSIS — J449 Chronic obstructive pulmonary disease, unspecified: Secondary | ICD-10-CM | POA: Diagnosis not present

## 2018-02-14 DIAGNOSIS — K08409 Partial loss of teeth, unspecified cause, unspecified class: Secondary | ICD-10-CM | POA: Diagnosis not present

## 2018-02-14 DIAGNOSIS — I252 Old myocardial infarction: Secondary | ICD-10-CM | POA: Diagnosis not present

## 2018-02-14 DIAGNOSIS — J309 Allergic rhinitis, unspecified: Secondary | ICD-10-CM | POA: Diagnosis not present

## 2018-02-14 DIAGNOSIS — I110 Hypertensive heart disease with heart failure: Secondary | ICD-10-CM | POA: Diagnosis not present

## 2018-02-14 DIAGNOSIS — E785 Hyperlipidemia, unspecified: Secondary | ICD-10-CM | POA: Diagnosis not present

## 2018-02-14 DIAGNOSIS — I2510 Atherosclerotic heart disease of native coronary artery without angina pectoris: Secondary | ICD-10-CM | POA: Diagnosis not present

## 2018-02-14 DIAGNOSIS — R32 Unspecified urinary incontinence: Secondary | ICD-10-CM | POA: Diagnosis not present

## 2018-02-14 DIAGNOSIS — I509 Heart failure, unspecified: Secondary | ICD-10-CM | POA: Diagnosis not present

## 2018-02-14 DIAGNOSIS — I4891 Unspecified atrial fibrillation: Secondary | ICD-10-CM | POA: Diagnosis not present

## 2018-03-14 ENCOUNTER — Telehealth: Payer: Self-pay

## 2018-03-14 NOTE — Telephone Encounter (Signed)
Referral notes received from [facility]. Referring Provider: Willey Blade, NP Phone: [phone] Fax: [phone].  Notes sent to scheduling.

## 2018-03-29 ENCOUNTER — Encounter: Payer: Self-pay | Admitting: Internal Medicine

## 2018-03-29 ENCOUNTER — Ambulatory Visit: Payer: Medicare HMO | Admitting: Internal Medicine

## 2018-03-29 VITALS — BP 122/66 | HR 71 | Ht 63.0 in | Wt 212.6 lb

## 2018-03-29 DIAGNOSIS — I4892 Unspecified atrial flutter: Secondary | ICD-10-CM | POA: Diagnosis not present

## 2018-03-29 DIAGNOSIS — I10 Essential (primary) hypertension: Secondary | ICD-10-CM

## 2018-03-29 DIAGNOSIS — R0683 Snoring: Secondary | ICD-10-CM

## 2018-03-29 DIAGNOSIS — Z9861 Coronary angioplasty status: Secondary | ICD-10-CM

## 2018-03-29 DIAGNOSIS — I2510 Atherosclerotic heart disease of native coronary artery without angina pectoris: Secondary | ICD-10-CM

## 2018-03-29 DIAGNOSIS — I480 Paroxysmal atrial fibrillation: Secondary | ICD-10-CM | POA: Diagnosis not present

## 2018-03-29 DIAGNOSIS — I5033 Acute on chronic diastolic (congestive) heart failure: Secondary | ICD-10-CM | POA: Diagnosis not present

## 2018-03-29 DIAGNOSIS — R69 Illness, unspecified: Secondary | ICD-10-CM | POA: Diagnosis not present

## 2018-03-29 NOTE — Consult Note (Signed)
Cardiology Office Note:    Date:  03/29/2018   ID:  Rhonda [name], DOB 01-Jul-1955, MRN [id number]  PCP:  Patient, No Pcp Per  Cardiologist:  No primary care provider on file.  Electrophysiologist:  None   Referring MD: Mardi Mainland,*   Establishing cardiovascular care, history of diastolic heart failure  History of Present Illness:    Rhonda [name] is a 62 y.o. female with a hx of diastolic heart failure, hypertension, atrial fibrillation on Eliquis, and lower extremity swelling.  She presents today for establishing cardiovascular care.  Presents today with her husband, they are newly married.  She is previously been seen by my colleagues Dr. Ronda Fairly and Dr. Dorris Carnes.  She notes that her last heart failure exacerbation with hospitalization was in 2017, I believe that documentation is in the medical record from September 2017.  She had an MI at age 59 that presented with throat burning and she had a stent placed to the RCA (bare-metal stent), circumflex and LAD disease in March 2011.  Her ejection fraction has dropped down to the 30% range with moderate to severe MR and TR, however when she was back in sinus rhythm out of atrial fibrillation her ejection fraction improved to 65% and has been preserved since that time.  She endorses dyspnea on exertion especially when climbing hills. Her exercise regimen consists of one walk per day. She endorses significant lower extremity swelling, and 20 lb weight gain since her honeymoon in June.  She denies chest pain, chest pressure, dyspnea at rest, palpitations, PND, orthopnea. Denies syncope or presyncope.   She takes apixaban, atorvastatin, diltiazem, furosemide, metoprolol, and spironolactone. She is not taking her losartan or potassium.  She is a current smoker since teenage years. She has a history of alcohol use.  Past Medical History:  Diagnosis Date  . Atrial fibrillation ([place])   . CAD (coronary artery disease) 08/2009   BMS to RCA, non-obstructive in CX and LAD 2011  . CHF (congestive heart failure) ([place])   . Hypertension     Past Surgical History:  Procedure Laterality Date  . c-section    . CORONARY ANGIOPLASTY WITH STENT PLACEMENT      Current Medications: Current Meds  Medication Sig  . apixaban (ELIQUIS) 5 MG TABS tablet Take 5 mg by mouth 2 (two) times daily.  Marland Kitchen atorvastatin (LIPITOR) 20 MG tablet Take 1 tablet (20 mg total) by mouth daily.  Marland Kitchen diltiazem (CARDIZEM) 60 MG tablet Take 1 tablet (60 mg total) by mouth every 8 (eight) hours.  . furosemide (LASIX) 40 MG tablet Take 1 tablet (40 mg total) by mouth daily.  . metoprolol (LOPRESSOR) 100 MG tablet Take 1 tablet (100 mg total) by mouth every 12 (twelve) hours.  Marland Kitchen spironolactone (ALDACTONE) 25 MG tablet Take 0.5 tablets (12.5 mg total) by mouth daily.     Allergies:   Ace inhibitors   Social History   Socioeconomic History  . Marital status: Divorced    Spouse name: Not on file  . Number of children: Not on file  . Years of education: Not on file  . Highest education level: Not on file  Occupational History  . Not on file  Social Needs  . Financial resource strain: Not on file  . Food insecurity:    Worry: Not on file    Inability: Not on file  . Transportation needs:    Medical: Not on file    Non-medical: Not on file  Tobacco Use  .  Smoking status: Current Every Day Smoker    Packs/day: 0.20    Types: Cigarettes  . Smokeless tobacco: Never Used  Substance and Sexual Activity  . Alcohol use: Yes    Alcohol/week: 2.0 standard drinks    Types: 2 Cans of beer per week    Comment: Former heavy drinker, with whiskey. Was in [place] but is not drinking beer again.,   . Drug use: Yes    Types: Marijuana  . Sexual activity: Yes    Birth control/protection: Post-menopausal  Lifestyle  . Physical activity:    Days per week: Not on file    Minutes per session: Not on file  . Stress: Not on file  Relationships  . Social  connections:    Talks on phone: Not on file    Gets together: Not on file    Attends religious service: Not on file    Active member of club or organization: Not on file    Attends meetings of clubs or organizations: Not on file    Relationship status: Not on file  Other Topics Concern  . Not on file  Social History Narrative  . Not on file     Family History: The patient's family history includes CAD in her maternal grandmother and mother; Diabetes Mellitus II in her father; Hypertension in her sister.  ROS:   Please see the history of present illness.    All other systems reviewed and are negative.  EKGs/Labs/Other Studies Reviewed:    The following studies were reviewed today:   EKG:  EKG is ordered today.  The ekg ordered today demonstrates atrial flutter with variable rate, ventricular rate 106 bpm.  Recent Labs: 03/29/2018: BUN 20; Creatinine, Ser 0.88; Hemoglobin 15.1; Platelets 222; Potassium 4.3; Sodium 137  Recent Lipid Panel No results found for: CHOL, TRIG, HDL, CHOLHDL, VLDL, LDLCALC, LDLDIRECT  Physical Exam:    VS:  BP 122/66   Pulse 71   Ht 5\' 3"  (1.6 m)   Wt 212 lb 9.6 oz (96.4 kg)   SpO2 94%   BMI 37.66 kg/m     Wt Readings from Last 3 Encounters:  03/29/18 212 lb 9.6 oz (96.4 kg)  02/08/16 204 lb 8 oz (92.8 kg)     GEN: Well nourished, well developed in no acute distress HEENT: Normal NECK: JVD difficult to appreciate; No carotid bruits LYMPHATICS: No lymphadenopathy CARDIAC: irregular rhythm, tachycardic, no murmurs, rubs, gallops RESPIRATORY:  Clear to auscultation without rales, wheezing or rhonchi  ABDOMEN: Soft, non-tender, non-distended.  MUSCULOSKELETAL:  Tense bilateral edema; No deformity  SKIN: Warm and dry, venous varicosities on the lower extremities bilaterally. NEUROLOGIC:  Alert and oriented x 3 PSYCHIATRIC:  Normal affect   ASSESSMENT:    1. Acute on chronic diastolic CHF (congestive heart failure) ([place])   2. Snoring     3. Paroxysmal atrial fibrillation (HCC)   4. Coronary artery disease involving native coronary artery of native heart without angina pectoris   5. CAD S/P percutaneous coronary angioplasty   6. Essential hypertension    PLAN:    In order of problems listed above:  It appears we need to make some medication adjustments to best optimize her therapy.  However first I would like a better sense of her ejection fraction to tailor therapy.  We will obtain an echocardiogram.  Her last hemoglobin check in July demonstrated an elevated hemoglobin at 16.6.  She endorses snoring and has not been screened for sleep apnea.  This could  be secondary increase in hemoglobin.  We will make a referral to sleep clinic, her Epworth Sleepiness Scale score is 19.  We will also obtain a better sense of her electrolytes given that she is not taking her potassium, and a better sense of her renal function.  I would also like to screen her for diabetes mellitus type 2 with a hemoglobin A1c.  We will repeat a CBC while she is on anticoagulation.  At the moment she is taking 40 mg of furosemide daily, however I wonder if her lower extreme the swelling is a combination of diastolic heart failure, medication and dietary noncompliance, and venous insufficiency.  I have instructed her to wear compression stockings.  Her new husband has compression stockings that he does not wear and he will share these with her.  I have offered her to get her own if she feels that they are helpful.  I do see venous varicosities on her legs and I think this may be helpful.  I will follow-up with the patient after her echocardiogram is completed, and we will see her back in follow-up in approximately 3 months time.   Medication Adjustments/Labs and Tests Ordered: Current medicines are reviewed at length with the patient today.  Concerns regarding medicines are outlined above.  Orders Placed This Encounter  Procedures  . Basic metabolic panel  .  CBC with Differential  . HgB A1c  . EKG 12-Lead  . ECHOCARDIOGRAM COMPLETE  . Split night study   No orders of the defined types were placed in this encounter.   Patient Instructions  Your physician recommends that you continue on your current medications as directed. Please refer to the Current Medication list given to you today.  If you need a refill on your cardiac medications before your next appointment, please call your pharmacy.   Lab work: Art gallery manager, Psychologist, occupational, Hotel manager If you have labs (blood work) drawn today and your tests are completely normal, you will receive your results only by: Marland Kitchen MyChart Message (if you have MyChart) OR . A paper copy in the mail If you have any lab test that is abnormal or we need to change your treatment, we will call you to review the results.  Testing/Procedures: Your physician has requested that you have an echocardiogram. Echocardiography is a painless test that uses sound waves to create images of your heart. It provides your doctor with information about the size and shape of your heart and how well your heart's chambers and valves are working. This procedure takes approximately one hour. There are no restrictions for this procedure.  Your physician has recommended that you have a sleep study. This test records several body functions during sleep, including: brain activity, eye movement, oxygen and carbon dioxide blood levels, heart rate and rhythm, breathing rate and rhythm, the flow of air through your mouth and nose, snoring, body muscle movements, and chest and belly movement.    Follow-Up: At [facility], you and your health needs are our priority.  As part of our continuing mission to provide you with exceptional heart care, we have created designated Provider Care Teams.  These Care Teams include your primary Cardiologist (physician) and Advanced Practice Providers (APPs -  Physician Assistants and Nurse Practitioners) who all work together to  provide you with the care you need, when you need it. You will need a follow up appointment in 3 months.  Please call our office 2 months in advance to schedule this appointment.  You may  see Dr.Kharter Sestak one of the following Advanced Practice Providers on your designated Care Team:   Rosaria Ferries, PA-C . Jory Sims, DNP, ANP  Any Other Special Instructions Will Be Listed Below (If Applicable).       Signed, Elouise Munroe, MD  03/29/2018 10:18 PM    [place]

## 2018-03-29 NOTE — Patient Instructions (Signed)
Your physician recommends that you continue on your current medications as directed. Please refer to the Current Medication list given to you today.  If you need a refill on your cardiac medications before your next appointment, please call your pharmacy.   Lab work: Art gallery manager, Psychologist, occupational, Hotel manager If you have labs (blood work) drawn today and your tests are completely normal, you will receive your results only by: Marland Kitchen MyChart Message (if you have MyChart) OR . A paper copy in the mail If you have any lab test that is abnormal or we need to change your treatment, we will call you to review the results.  Testing/Procedures: Your physician has requested that you have an echocardiogram. Echocardiography is a painless test that uses sound waves to create images of your heart. It provides your doctor with information about the size and shape of your heart and how well your heart's chambers and valves are working. This procedure takes approximately one hour. There are no restrictions for this procedure.  Your physician has recommended that you have a sleep study. This test records several body functions during sleep, including: brain activity, eye movement, oxygen and carbon dioxide blood levels, heart rate and rhythm, breathing rate and rhythm, the flow of air through your mouth and nose, snoring, body muscle movements, and chest and belly movement.    Follow-Up: At [facility], you and your health needs are our priority.  As part of our continuing mission to provide you with exceptional heart care, we have created designated Provider Care Teams.  These Care Teams include your primary Cardiologist (physician) and Advanced Practice Providers (APPs -  Physician Assistants and Nurse Practitioners) who all work together to provide you with the care you need, when you need it. You will need a follow up appointment in 3 months.  Please call our office 2 months in advance to schedule this appointment.  You may see  Dr.Acharya one of the following Advanced Practice Providers on your designated Care Team:   Rosaria Ferries, PA-C . Jory Sims, DNP, ANP  Any Other Special Instructions Will Be Listed Below (If Applicable).

## 2018-03-30 ENCOUNTER — Telehealth: Payer: Self-pay | Admitting: *Deleted

## 2018-03-30 LAB — HEMOGLOBIN A1C
Est. average glucose Bld gHb Est-mCnc: 117 mg/dL
Hgb A1c MFr Bld: 5.7 % — ABNORMAL HIGH (ref 4.8–5.6)

## 2018-03-30 LAB — CBC WITH DIFFERENTIAL/PLATELET
BASOS: 0 %
Basophils Absolute: 0 10*3/uL (ref 0.0–0.2)
EOS (ABSOLUTE): 0.1 10*3/uL (ref 0.0–0.4)
EOS: 1 %
HEMATOCRIT: 43.9 % (ref 34.0–46.6)
Hemoglobin: 15.1 g/dL (ref 11.1–15.9)
IMMATURE GRANULOCYTES: 0 %
Immature Grans (Abs): 0 10*3/uL (ref 0.0–0.1)
LYMPHS ABS: 2.3 10*3/uL (ref 0.7–3.1)
Lymphs: 20 %
MCH: 32.3 pg (ref 26.6–33.0)
MCHC: 34.4 g/dL (ref 31.5–35.7)
MCV: 94 fL (ref 79–97)
MONOS ABS: 0.9 10*3/uL (ref 0.1–0.9)
Monocytes: 8 %
NEUTROS PCT: 71 %
Neutrophils Absolute: 7.9 10*3/uL — ABNORMAL HIGH (ref 1.4–7.0)
PLATELETS: 222 10*3/uL (ref 150–450)
RBC: 4.68 x10E6/uL (ref 3.77–5.28)
RDW: 13.1 % (ref 12.3–15.4)
WBC: 11.3 10*3/uL — ABNORMAL HIGH (ref 3.4–10.8)

## 2018-03-30 LAB — BASIC METABOLIC PANEL
BUN / CREAT RATIO: 23 (ref 12–28)
BUN: 20 mg/dL (ref 8–27)
CALCIUM: 9.7 mg/dL (ref 8.7–10.3)
CO2: 22 mmol/L (ref 20–29)
Chloride: 101 mmol/L (ref 96–106)
Creatinine, Ser: 0.88 mg/dL (ref 0.57–1.00)
GFR, EST AFRICAN AMERICAN: 83 mL/min/{1.73_m2} (ref >59)
GFR, EST NON AFRICAN AMERICAN: 72 mL/min/{1.73_m2} (ref >59)
GLUCOSE: 108 mg/dL — AB (ref 65–99)
Potassium: 4.3 mmol/L (ref 3.5–5.2)
SODIUM: 137 mmol/L (ref 134–144)

## 2018-03-30 NOTE — Telephone Encounter (Signed)
PA for sleep study submitted via [employer] web portal.

## 2018-03-30 NOTE — Telephone Encounter (Signed)
-----   Message from Lamar Laundry, RN sent at 03/29/2018  2:54 PM EDT ----- Regarding: pt needs a sleep study Per Dr.Acharya this pt needs to be scheduled for a sleep study.

## 2018-04-03 ENCOUNTER — Ambulatory Visit (HOSPITAL_COMMUNITY): Payer: Medicare HMO | Attending: Cardiovascular Disease

## 2018-04-03 ENCOUNTER — Other Ambulatory Visit: Payer: Self-pay

## 2018-04-03 DIAGNOSIS — I480 Paroxysmal atrial fibrillation: Secondary | ICD-10-CM | POA: Diagnosis not present

## 2018-04-03 DIAGNOSIS — I5033 Acute on chronic diastolic (congestive) heart failure: Secondary | ICD-10-CM | POA: Insufficient documentation

## 2018-04-05 ENCOUNTER — Telehealth: Payer: Self-pay

## 2018-04-05 NOTE — Telephone Encounter (Signed)
-----   Message from Elouise Munroe, MD sent at 04/04/2018  6:44 PM EDT ----- The top chambers of the heart (where atrial fibrillation comes from) are enlarged. This will make it more difficult to keep you out of atrial fibrillation as time goes on.  The pump function of the heart is normal.

## 2018-04-05 NOTE — Telephone Encounter (Signed)
Pt aware of echo and lab results with verbal understanding.

## 2018-04-09 ENCOUNTER — Other Ambulatory Visit: Payer: Self-pay | Admitting: Internal Medicine

## 2018-04-09 DIAGNOSIS — I480 Paroxysmal atrial fibrillation: Secondary | ICD-10-CM

## 2018-04-09 DIAGNOSIS — R0683 Snoring: Secondary | ICD-10-CM

## 2018-04-09 DIAGNOSIS — I10 Essential (primary) hypertension: Secondary | ICD-10-CM

## 2018-04-10 ENCOUNTER — Telehealth: Payer: Self-pay | Admitting: *Deleted

## 2018-04-10 NOTE — Telephone Encounter (Signed)
Left message to return a call. 

## 2018-04-10 NOTE — Telephone Encounter (Signed)
Patient returned a call to me and was informed Holland Falling denied in lab sleep study. HST scheduled for 04/23/18 @ 1:00pm.

## 2018-04-12 DIAGNOSIS — I509 Heart failure, unspecified: Secondary | ICD-10-CM | POA: Diagnosis not present

## 2018-04-12 DIAGNOSIS — J449 Chronic obstructive pulmonary disease, unspecified: Secondary | ICD-10-CM | POA: Diagnosis not present

## 2018-04-12 DIAGNOSIS — R69 Illness, unspecified: Secondary | ICD-10-CM | POA: Diagnosis not present

## 2018-04-12 DIAGNOSIS — M5030 Other cervical disc degeneration, unspecified cervical region: Secondary | ICD-10-CM | POA: Diagnosis not present

## 2018-04-12 DIAGNOSIS — M549 Dorsalgia, unspecified: Secondary | ICD-10-CM | POA: Diagnosis not present

## 2018-04-23 ENCOUNTER — Ambulatory Visit (HOSPITAL_BASED_OUTPATIENT_CLINIC_OR_DEPARTMENT_OTHER): Payer: Medicare HMO | Attending: Internal Medicine

## 2018-04-23 DIAGNOSIS — H0288A Meibomian gland dysfunction right eye, upper and lower eyelids: Secondary | ICD-10-CM | POA: Diagnosis not present

## 2018-04-23 DIAGNOSIS — H52223 Regular astigmatism, bilateral: Secondary | ICD-10-CM | POA: Diagnosis not present

## 2018-04-23 DIAGNOSIS — H0288B Meibomian gland dysfunction left eye, upper and lower eyelids: Secondary | ICD-10-CM | POA: Diagnosis not present

## 2018-04-23 DIAGNOSIS — H5203 Hypermetropia, bilateral: Secondary | ICD-10-CM | POA: Diagnosis not present

## 2018-04-23 DIAGNOSIS — Z9841 Cataract extraction status, right eye: Secondary | ICD-10-CM | POA: Diagnosis not present

## 2018-04-23 DIAGNOSIS — Z9842 Cataract extraction status, left eye: Secondary | ICD-10-CM | POA: Diagnosis not present

## 2018-04-23 DIAGNOSIS — H353121 Nonexudative age-related macular degeneration, left eye, early dry stage: Secondary | ICD-10-CM | POA: Diagnosis not present

## 2018-05-15 ENCOUNTER — Encounter: Payer: Self-pay | Admitting: Radiology

## 2018-05-24 DIAGNOSIS — N950 Postmenopausal bleeding: Secondary | ICD-10-CM | POA: Diagnosis not present

## 2018-05-28 ENCOUNTER — Other Ambulatory Visit (HOSPITAL_COMMUNITY)
Admission: RE | Admit: 2018-05-28 | Discharge: 2018-05-28 | Disposition: A | Discharge status: Home / Self Care | Payer: Medicare HMO | Source: Ambulatory Visit | Attending: Obstetrics and Gynecology | Admitting: Obstetrics and Gynecology

## 2018-05-28 ENCOUNTER — Encounter: Payer: Self-pay | Admitting: Obstetrics and Gynecology

## 2018-05-28 ENCOUNTER — Other Ambulatory Visit: Payer: Self-pay | Admitting: Obstetrics and Gynecology

## 2018-05-28 ENCOUNTER — Ambulatory Visit: Payer: Medicare HMO | Admitting: Obstetrics and Gynecology

## 2018-05-28 VITALS — BP 106/69 | HR 67 | Ht 63.0 in | Wt 205.0 lb

## 2018-05-28 DIAGNOSIS — N950 Postmenopausal bleeding: Secondary | ICD-10-CM | POA: Diagnosis not present

## 2018-05-28 DIAGNOSIS — Z1231 Encounter for screening mammogram for malignant neoplasm of breast: Secondary | ICD-10-CM

## 2018-05-28 NOTE — Progress Notes (Signed)
Had not had period in eight years. Has had continuous bright red bleeding since November, very light flow. Noticed also abdominal fullness.  Last pap smear five years ago.  Last mammogram 2.5 years ago.

## 2018-05-28 NOTE — Progress Notes (Signed)
Obstetrics and Gynecology New Patient Evaluation  Appointment Date: 05/28/2018  OBGYN Clinic: [facility]  Primary Care Provider: Mardi Mainland  Referring Provider: Mardi Mainland ([facility])  Chief Complaint:  Chief Complaint  Patient presents with  . Gynecologic Exam    Abnormal uterine bleeding    History of Present Illness: Rhonda [name] is a 62 y.o. Caucasian G4P3 (LMP: age 61), seen for the above chief complaint. Her past medical history is significant for significant CV history  At her late November 2019 wellness visit she noted that she was having vaginal bleeding that started earlier that month and +VB seen on exam. She was referred to me for further evaluation. She states that it was always BRB and started out heavy and now just spotting; she denies never any pain and no bleeding since menopause 8 years ago. She states that she's been on her dose of eliquis for a long time and no new medications were started around the time the bleeding started.   No breast s/s, fevers, chills, pain, blood in urine or bowel movements.   Review of Systems:  as noted in the History of Present Illness.  Patient Active Problem List   Diagnosis Date Noted  . Acute on chronic diastolic CHF (congestive heart failure) ([place])   . Acute on chronic congestive heart failure ([place]) 02/03/2016  . CHF (congestive heart failure), NYHA class I ([place]) 02/03/2016  . Essential hypertension 02/03/2016  . Atrial fibrillation ([place]) 02/03/2016  . Atrial flutter [facility])     Past Medical History:  Past Medical History:  Diagnosis Date  . Atrial fibrillation ([place])   . CAD (coronary artery disease) 08/2009   BMS to RCA, non-obstructive in CX and LAD 2011  . CHF (congestive heart failure) ([place])   . Hypertension     Past Surgical History:  Past Surgical History:  Procedure Laterality Date  . CESAREAN SECTION     x 1. no BTL  .  CORONARY ANGIOPLASTY WITH STENT PLACEMENT    . DILATION AND CURETTAGE, DIAGNOSTIC / THERAPEUTIC     for SAB    Past Obstetrical History:  OB History  Gravida Para Term Preterm AB Living  4 3 3   1 3   SAB TAB Ectopic Multiple Live Births  1       3    # Outcome Date GA Lbr Len/2nd Weight Sex Delivery Anes PTL Lv  4 SAB           3 Term           2 Term           1 Term             Obstetric Comments  c-section x 1. D&C x 1    Past Gynecological History: As per HPI. History of Pap Smear(s): Yes.   Last pap patient unsure but never abnormal.  History of HRT use: No.  Social History:  Social History   Socioeconomic History  . Marital status: Divorced    Spouse name: Not on file  . Number of children: Not on file  . Years of education: Not on file  . Highest education level: Not on file  Occupational History  . Not on file  Social Needs  . Financial resource strain: Not on file  . Food insecurity:    Worry: Not on file    Inability: Not on file  . Transportation needs:  Medical: Not on file    Non-medical: Not on file  Tobacco Use  . Smoking status: Current Every Day Smoker    Packs/day: 0.20    Types: Cigarettes  . Smokeless tobacco: Never Used  Substance and Sexual Activity  . Alcohol use: Yes    Alcohol/week: 2.0 standard drinks    Types: 2 Cans of beer per week    Comment: Former heavy drinker, with whiskey. Was in [place] but is not drinking beer again.,   . Drug use: Yes    Types: Marijuana  . Sexual activity: Yes    Birth control/protection: Post-menopausal  Lifestyle  . Physical activity:    Days per week: Not on file    Minutes per session: Not on file  . Stress: Not on file  Relationships  . Social connections:    Talks on phone: Not on file    Gets together: Not on file    Attends religious service: Not on file    Active member of club or organization: Not on file    Attends meetings of clubs or organizations: Not on file    Relationship  status: Not on file  . Intimate partner violence:    Fear of current or ex partner: Not on file    Emotionally abused: Not on file    Physically abused: Not on file    Forced sexual activity: Not on file  Other Topics Concern  . Not on file  Social History Narrative  . Not on file    Family History:  Family History  Problem Relation Age of Onset  . CAD Mother   . Diabetes Mellitus II Father   . Hypertension Sister   . CAD Maternal Grandmother    Health Maintenance:  Mammogram(s): Yes.   Date: 2017 Colonoscopy: No.   Medications Rhonda [name] had no medications administered during this visit. Current Outpatient Medications  Medication Sig Dispense Refill  . apixaban (ELIQUIS) 5 MG TABS tablet Take 5 mg by mouth 2 (two) times daily.    Marland Kitchen atorvastatin (LIPITOR) 20 MG tablet Take 1 tablet (20 mg total) by mouth daily. 30 tablet 0  . diltiazem (CARDIZEM) 60 MG tablet Take 1 tablet (60 mg total) by mouth every 8 (eight) hours. 90 tablet 0  . furosemide (LASIX) 40 MG tablet Take 1 tablet (40 mg total) by mouth daily. 30 tablet 0  . metoprolol (LOPRESSOR) 100 MG tablet Take 1 tablet (100 mg total) by mouth every 12 (twelve) hours. 60 tablet 0  . spironolactone (ALDACTONE) 25 MG tablet Take 0.5 tablets (12.5 mg total) by mouth daily. 30 tablet 0   No current facility-administered medications for this visit.     Allergies Ace inhibitors   Physical Exam:  BP 106/69   Pulse 67   Ht 5\' 3"  (1.6 m)   Wt 205 lb (93 kg)   BMI 36.31 kg/m  Body mass index is 36.31 kg/m. General appearance: Well nourished, well developed female in no acute distress.  Neck:  Supple, normal appearance, and no thyromegaly  Cardiovascular: normal s1 and s2.  No murmurs, rubs or gallops. Respiratory:  Clear to auscultation bilateral. Normal respiratory effort Abdomen: positive bowel sounds and no masses, hernias; diffusely non tender to palpation, non distended Neuro/Psych:  Normal mood and affect.   Skin:  Warm and dry.  Lymphatic:  No inguinal lymphadenopathy.   Pelvic exam: is not limited by body habitus EGBUS: within normal limits, Vagina: within normal limits and with approximately 2-36mL  of old blood in the vault, no active bleeding. Cervix: normal appearing cervix without tenderness, discharge or lesions. Uterus:  nonenlarged and non tender and Adnexa:  normal adnexa and no mass, fullness, tenderness Rectovaginal: deferred  See procedure note for pap and endometrial biopsy  Laboratory: none  Radiology: none  Assessment: pt stable  Plan:  1. Postmenopausal bleeding F/u pap and embx. Will also order u/s - US PELVIC COMPLETE WITH TRANSVAGINAL; Future - Cytology - PAP( [place]) - Surgical pathology( [facility]/ [facility])  2. Health maintenance Patient hesitant about getting a colonoscopy. Pt amenable to getting another surveillance mammogram.  RTC PRN based on findings. Will call pt with results.   Durene Romans MD Attending Center for [facility] farm [facility])

## 2018-05-29 ENCOUNTER — Encounter: Payer: Self-pay | Admitting: Obstetrics and Gynecology

## 2018-05-29 DIAGNOSIS — N950 Postmenopausal bleeding: Secondary | ICD-10-CM | POA: Insufficient documentation

## 2018-05-29 DIAGNOSIS — Z8742 Personal history of other diseases of the female genital tract: Secondary | ICD-10-CM

## 2018-05-29 NOTE — Procedures (Signed)
Endometrial Biopsy Procedure Note  Pre-operative Diagnosis: PMB  Post-operative Diagnosis: same  Procedure Details  Urine pregnancy test was not done.  The risks (including infection, bleeding, pain, and uterine perforation) and benefits of the procedure were explained to the patient and Written informed consent was obtained.  The patient was placed in the dorsal lithotomy position.  Bimanual exam showed the uterus to be in the neutral position.  A Graves' speculum inserted in the vagina, and the cervix was visualized and a pap smear performed. The cervix was then prepped with povidone iodine, and a sharp tenaculum was applied to the anterior lip of the cervix for stabilization.  A pipelle was inserted into the uterine cavity and sounded the uterus to a depth of 7.5cm.  A Small amount of tissue was collected after 3 passes. The sample was sent for pathologic examination.  Condition: Stable  Complications: None  Plan: The patient was advised to call for any fever or for prolonged or severe pain or bleeding. She was advised to use OTC analgesics as needed for mild to moderate pain. She was advised to avoid vaginal intercourse for 48 hours or until the bleeding has completely stopped.  Durene Romans MD Attending [facility] [employer] [facility])

## 2018-06-01 LAB — CYTOLOGY - PAP
CHLAMYDIA, DNA PROBE: NEGATIVE
DIAGNOSIS: NEGATIVE
HPV: NOT DETECTED
Neisseria Gonorrhea: NEGATIVE
Trichomonas: POSITIVE — AB

## 2018-06-05 ENCOUNTER — Encounter: Payer: Self-pay | Admitting: Obstetrics and Gynecology

## 2018-06-05 ENCOUNTER — Telehealth: Payer: Self-pay | Admitting: Obstetrics and Gynecology

## 2018-06-05 DIAGNOSIS — A599 Trichomoniasis, unspecified: Secondary | ICD-10-CM | POA: Insufficient documentation

## 2018-06-05 HISTORY — DX: Trichomoniasis, unspecified: A59.9

## 2018-06-05 MED ORDER — METRONIDAZOLE 500 MG PO TABS: ORAL_TABLET | ORAL | 0 refills | Status: DC

## 2018-06-05 NOTE — Addendum Note (Signed)
Addended by: Aletha Halim on: 06/05/2018 11:06 AM   Modules accepted: Orders

## 2018-06-05 NOTE — Telephone Encounter (Signed)
GYN Telephone Note Patient called at [phone] and VM identified it as her VM. VM left stating she had an infection and an abx was sent into the pharmacy for her and that I will try and call her later this week.  Durene Romans MD Attending [facility] (Faculty Practice) 06/05/2018 Time: 1107am

## 2018-06-08 ENCOUNTER — Telehealth: Payer: Self-pay | Admitting: Obstetrics and Gynecology

## 2018-06-08 NOTE — Telephone Encounter (Signed)
GYN Telephone Note Patient called and d/w her re: embx and STI dx. Pt didn't get VM I left. I told her about results and abx sent in and date and time for confirmed for u/s.  Durene Romans MD Attending [facility] (Faculty Practice) 06/08/2018 Time: 1021am

## 2018-06-20 ENCOUNTER — Ambulatory Visit: Payer: Medicare HMO

## 2018-06-20 ENCOUNTER — Encounter: Payer: Self-pay | Admitting: Emergency Medicine

## 2018-06-20 ENCOUNTER — Emergency Department: Payer: Medicare HMO

## 2018-06-20 ENCOUNTER — Inpatient Hospital Stay
Admission: EM | Admit: 2018-06-20 | Discharge: 2018-06-24 | DRG: 193 | Disposition: A | Discharge status: Home / Self Care | Payer: Medicare HMO | Attending: Internal Medicine | Admitting: Internal Medicine

## 2018-06-20 ENCOUNTER — Other Ambulatory Visit: Payer: Self-pay

## 2018-06-20 DIAGNOSIS — Z8249 Family history of ischemic heart disease and other diseases of the circulatory system: Secondary | ICD-10-CM

## 2018-06-20 DIAGNOSIS — J9601 Acute respiratory failure with hypoxia: Secondary | ICD-10-CM

## 2018-06-20 DIAGNOSIS — F17210 Nicotine dependence, cigarettes, uncomplicated: Secondary | ICD-10-CM | POA: Diagnosis present

## 2018-06-20 DIAGNOSIS — Z23 Encounter for immunization: Secondary | ICD-10-CM | POA: Diagnosis not present

## 2018-06-20 DIAGNOSIS — Z955 Presence of coronary angioplasty implant and graft: Secondary | ICD-10-CM | POA: Diagnosis not present

## 2018-06-20 DIAGNOSIS — J440 Chronic obstructive pulmonary disease with acute lower respiratory infection: Secondary | ICD-10-CM | POA: Diagnosis present

## 2018-06-20 DIAGNOSIS — J189 Pneumonia, unspecified organism: Principal | ICD-10-CM | POA: Diagnosis present

## 2018-06-20 DIAGNOSIS — Z833 Family history of diabetes mellitus: Secondary | ICD-10-CM

## 2018-06-20 DIAGNOSIS — I4891 Unspecified atrial fibrillation: Secondary | ICD-10-CM | POA: Diagnosis not present

## 2018-06-20 DIAGNOSIS — I2510 Atherosclerotic heart disease of native coronary artery without angina pectoris: Secondary | ICD-10-CM | POA: Diagnosis present

## 2018-06-20 DIAGNOSIS — N939 Abnormal uterine and vaginal bleeding, unspecified: Secondary | ICD-10-CM | POA: Diagnosis not present

## 2018-06-20 DIAGNOSIS — Z888 Allergy status to other drugs, medicaments and biological substances status: Secondary | ICD-10-CM | POA: Diagnosis not present

## 2018-06-20 DIAGNOSIS — I5032 Chronic diastolic (congestive) heart failure: Secondary | ICD-10-CM | POA: Diagnosis present

## 2018-06-20 DIAGNOSIS — Z79899 Other long term (current) drug therapy: Secondary | ICD-10-CM

## 2018-06-20 DIAGNOSIS — Z7901 Long term (current) use of anticoagulants: Secondary | ICD-10-CM

## 2018-06-20 DIAGNOSIS — I4821 Permanent atrial fibrillation: Secondary | ICD-10-CM | POA: Diagnosis present

## 2018-06-20 DIAGNOSIS — I509 Heart failure, unspecified: Secondary | ICD-10-CM

## 2018-06-20 DIAGNOSIS — I110 Hypertensive heart disease with heart failure: Secondary | ICD-10-CM | POA: Diagnosis present

## 2018-06-20 DIAGNOSIS — Z720 Tobacco use: Secondary | ICD-10-CM | POA: Diagnosis not present

## 2018-06-20 DIAGNOSIS — R0602 Shortness of breath: Secondary | ICD-10-CM | POA: Diagnosis not present

## 2018-06-20 LAB — CBC WITH DIFFERENTIAL/PLATELET
Abs Immature Granulocytes: 0.05 10*3/uL (ref 0.00–0.07)
Basophils Absolute: 0 10*3/uL (ref 0.0–0.1)
Basophils Relative: 0 %
Eosinophils Absolute: 0 10*3/uL (ref 0.0–0.5)
Eosinophils Relative: 0 %
HCT: 45.8 % (ref 36.0–46.0)
HEMOGLOBIN: 15.4 g/dL — AB (ref 12.0–15.0)
Immature Granulocytes: 1 %
LYMPHS PCT: 15 %
Lymphs Abs: 1.5 10*3/uL (ref 0.7–4.0)
MCH: 32.9 pg (ref 26.0–34.0)
MCHC: 33.6 g/dL (ref 30.0–36.0)
MCV: 97.9 fL (ref 80.0–100.0)
Monocytes Absolute: 0.8 10*3/uL (ref 0.1–1.0)
Monocytes Relative: 8 %
Neutro Abs: 8 10*3/uL — ABNORMAL HIGH (ref 1.7–7.7)
Neutrophils Relative %: 76 %
Platelets: 189 10*3/uL (ref 150–400)
RBC: 4.68 MIL/uL (ref 3.87–5.11)
RDW: 13.8 % (ref 11.5–15.5)
WBC: 10.4 10*3/uL (ref 4.0–10.5)
nRBC: 0 % (ref 0.0–0.2)

## 2018-06-20 LAB — INFLUENZA PANEL BY PCR (TYPE A & B)
Influenza A By PCR: NEGATIVE
Influenza B By PCR: NEGATIVE

## 2018-06-20 LAB — BASIC METABOLIC PANEL
Anion gap: 11 (ref 5–15)
BUN: 10 mg/dL (ref 6–20)
CO2: 27 mmol/L (ref 22–32)
Calcium: 8.8 mg/dL — ABNORMAL LOW (ref 8.9–10.3)
Chloride: 97 mmol/L — ABNORMAL LOW (ref 98–111)
Creatinine, Ser: 0.81 mg/dL (ref 0.44–1.00)
GFR calc Af Amer: >60 mL/min (ref >60)
GFR calc non Af Amer: >60 mL/min (ref >60)
GLUCOSE: 123 mg/dL — AB (ref 70–99)
Potassium: 3.2 mmol/L — ABNORMAL LOW (ref 3.5–5.1)
Sodium: 135 mmol/L (ref 135–145)

## 2018-06-20 LAB — TROPONIN I: Troponin I: <0.03 ng/mL (ref <0.03)

## 2018-06-20 LAB — BRAIN NATRIURETIC PEPTIDE: B Natriuretic Peptide: 458 pg/mL — ABNORMAL HIGH (ref 0.0–100.0)

## 2018-06-20 MED ORDER — METHYLPREDNISOLONE SODIUM SUCC 125 MG IJ SOLR
125.0000 mg | Freq: Once | INTRAMUSCULAR | Status: AC
  Administered 2018-06-20: 125 mg via INTRAVENOUS
  Filled 2018-06-20: qty 2

## 2018-06-20 MED ORDER — DILTIAZEM HCL 30 MG PO TABS
60.0000 mg | ORAL_TABLET | Freq: Three times a day (TID) | ORAL | Status: DC
  Administered 2018-06-21 – 2018-06-23 (×8): 60 mg via ORAL
  Filled 2018-06-20 (×8): qty 2

## 2018-06-20 MED ORDER — ONDANSETRON HCL 4 MG/2ML IJ SOLN: 4.0000 mg | Freq: Four times a day (QID) | INTRAMUSCULAR | Status: DC | PRN

## 2018-06-20 MED ORDER — SODIUM CHLORIDE 0.9% FLUSH
3.0000 mL | Freq: Two times a day (BID) | INTRAVENOUS | Status: DC
  Administered 2018-06-20 – 2018-06-24 (×8): 3 mL via INTRAVENOUS

## 2018-06-20 MED ORDER — GUAIFENESIN ER 600 MG PO TB12
600.0000 mg | ORAL_TABLET | Freq: Two times a day (BID) | ORAL | Status: DC
  Administered 2018-06-20 – 2018-06-24 (×8): 600 mg via ORAL
  Filled 2018-06-20 (×8): qty 1

## 2018-06-20 MED ORDER — DILTIAZEM HCL 60 MG PO TABS
60.0000 mg | ORAL_TABLET | Freq: Once | ORAL | Status: AC
  Administered 2018-06-20: 60 mg via ORAL
  Filled 2018-06-20: qty 1

## 2018-06-20 MED ORDER — APIXABAN 5 MG PO TABS
5.0000 mg | ORAL_TABLET | Freq: Two times a day (BID) | ORAL | Status: DC
  Administered 2018-06-20 – 2018-06-24 (×8): 5 mg via ORAL
  Filled 2018-06-20 (×8): qty 1

## 2018-06-20 MED ORDER — ACETAMINOPHEN 325 MG PO TABS: 650.0000 mg | ORAL_TABLET | Freq: Four times a day (QID) | ORAL | Status: DC | PRN

## 2018-06-20 MED ORDER — DOXYCYCLINE HYCLATE 100 MG PO TABS
100.0000 mg | ORAL_TABLET | Freq: Once | ORAL | Status: AC
  Administered 2018-06-20: 100 mg via ORAL
  Filled 2018-06-20: qty 1

## 2018-06-20 MED ORDER — SODIUM CHLORIDE 0.9 % IV SOLN
500.0000 mg | INTRAVENOUS | Status: DC
  Administered 2018-06-21 – 2018-06-22 (×2): 500 mg via INTRAVENOUS
  Filled 2018-06-20 (×3): qty 500

## 2018-06-20 MED ORDER — SODIUM CHLORIDE 0.9% FLUSH: 3.0000 mL | INTRAVENOUS | Status: DC | PRN

## 2018-06-20 MED ORDER — SPIRONOLACTONE 25 MG PO TABS
12.5000 mg | ORAL_TABLET | Freq: Every day | ORAL | Status: DC
  Administered 2018-06-21 – 2018-06-24 (×4): 12.5 mg via ORAL
  Filled 2018-06-20 (×2): qty 0.5
  Filled 2018-06-20 (×2): qty 1
  Filled 2018-06-20 (×2): qty 0.5
  Filled 2018-06-20: qty 1

## 2018-06-20 MED ORDER — FUROSEMIDE 10 MG/ML IJ SOLN
20.0000 mg | Freq: Once | INTRAMUSCULAR | Status: AC
  Administered 2018-06-20: 20 mg via INTRAVENOUS
  Filled 2018-06-20: qty 4

## 2018-06-20 MED ORDER — METOPROLOL TARTRATE 50 MG PO TABS
100.0000 mg | ORAL_TABLET | Freq: Two times a day (BID) | ORAL | Status: DC
  Administered 2018-06-20 – 2018-06-24 (×8): 100 mg via ORAL
  Filled 2018-06-20 (×8): qty 2

## 2018-06-20 MED ORDER — PNEUMOCOCCAL VAC POLYVALENT 25 MCG/0.5ML IJ INJ
0.5000 mL | INJECTION | INTRAMUSCULAR | Status: AC
  Administered 2018-06-21: 0.5 mL via INTRAMUSCULAR
  Filled 2018-06-20: qty 0.5

## 2018-06-20 MED ORDER — ONDANSETRON HCL 4 MG PO TABS: 4.0000 mg | ORAL_TABLET | Freq: Four times a day (QID) | ORAL | Status: DC | PRN

## 2018-06-20 MED ORDER — IPRATROPIUM-ALBUTEROL 0.5-2.5 (3) MG/3ML IN SOLN
3.0000 mL | Freq: Once | RESPIRATORY_TRACT | Status: AC
  Administered 2018-06-20: 3 mL via RESPIRATORY_TRACT
  Filled 2018-06-20: qty 3

## 2018-06-20 MED ORDER — SODIUM CHLORIDE 0.9 % IV SOLN
1.0000 g | INTRAVENOUS | Status: DC
  Administered 2018-06-21 – 2018-06-23 (×3): 1 g via INTRAVENOUS
  Filled 2018-06-20 (×2): qty 10
  Filled 2018-06-20 (×2): qty 1

## 2018-06-20 MED ORDER — ATORVASTATIN CALCIUM 20 MG PO TABS
20.0000 mg | ORAL_TABLET | Freq: Every day | ORAL | Status: DC
  Administered 2018-06-20 – 2018-06-23 (×4): 20 mg via ORAL
  Filled 2018-06-20 (×5): qty 1

## 2018-06-20 MED ORDER — ACETAMINOPHEN 650 MG RE SUPP: 650.0000 mg | Freq: Four times a day (QID) | RECTAL | Status: DC | PRN

## 2018-06-20 MED ORDER — SODIUM CHLORIDE 0.9 % IV SOLN: 250.0000 mL | INTRAVENOUS | Status: DC | PRN

## 2018-06-20 MED ORDER — MAGNESIUM SULFATE 2 GM/50ML IV SOLN
2.0000 g | Freq: Once | INTRAVENOUS | Status: AC
  Administered 2018-06-20: 2 g via INTRAVENOUS
  Filled 2018-06-20: qty 50

## 2018-06-20 MED ORDER — SENNOSIDES-DOCUSATE SODIUM 8.6-50 MG PO TABS: 1.0000 | ORAL_TABLET | Freq: Every evening | ORAL | Status: DC | PRN

## 2018-06-20 MED ORDER — SODIUM CHLORIDE 0.9 % IV SOLN
1.0000 g | Freq: Once | INTRAVENOUS | Status: AC
  Administered 2018-06-20: 1 g via INTRAVENOUS
  Filled 2018-06-20: qty 10

## 2018-06-20 MED ORDER — FUROSEMIDE 40 MG PO TABS
40.0000 mg | ORAL_TABLET | Freq: Every day | ORAL | Status: DC
  Administered 2018-06-21 – 2018-06-24 (×4): 40 mg via ORAL
  Filled 2018-06-20 (×4): qty 1

## 2018-06-20 MED ORDER — DILTIAZEM HCL 25 MG/5ML IV SOLN
5.0000 mg | Freq: Once | INTRAVENOUS | Status: AC
  Administered 2018-06-20: 5 mg via INTRAVENOUS
  Filled 2018-06-20: qty 5

## 2018-06-20 MED ORDER — POTASSIUM CHLORIDE CRYS ER 20 MEQ PO TBCR
40.0000 meq | EXTENDED_RELEASE_TABLET | Freq: Once | ORAL | Status: AC
  Administered 2018-06-20: 40 meq via ORAL
  Filled 2018-06-20: qty 2

## 2018-06-20 NOTE — ED Notes (Signed)
Pt was disconnected and O2 removed, pt ambulates to void.

## 2018-06-20 NOTE — ED Triage Notes (Signed)
FIRST NURSE NOTE-labored, [place]. sats 89 at check in. Pulled to triage.

## 2018-06-20 NOTE — Progress Notes (Signed)
Advanced care plan.  Purpose of the Encounter: CODE STATUS  Parties in Attendance:Patient  Patient's Decision Capacity:Good  Subjective/Patient's story: Presented for shortness of breath and cough   Objective/Medical story Patient has pneumonia and needs iv antibiotics Also has atrial fibrillation with rapid rate needs rate control   Goals of care determination:  Advance care directives goals of care and treatment plan discussed Patient wants everything done which includes CPR, intubation ventilator if the need arises   CODE STATUS: Full code   Time spent discussing advanced care planning: 16 minutes

## 2018-06-20 NOTE — ED Notes (Signed)
Pt placed on 2L [place] due to spo2 88%.  Pt is able to speak in full sentences, no acute distress

## 2018-06-20 NOTE — H&P (Signed)
[place] [facility] [place] NAME: Rhonda [name]    MR#:  [id number]  DATE OF BIRTH:  [birth year]  DATE OF ADMISSION:  06/20/2018  PRIMARY CARE PHYSICIAN: Mardi Mainland, FNP   REQUESTING/REFERRING PHYSICIAN:   CHIEF COMPLAINT:   Chief Complaint  Patient presents with  . Shortness of Breath    HISTORY OF PRESENT ILLNESS: Rhonda [name]  is a 63 y.o. female with a known history of atrial fibrillation, coronary artery disease, congestive heart failure, hypertension presented to the emergency room for shortness of breath and cough.  Cough is productive yellowish phlegm.  Patient has the symptoms for the last couple of days.  Was evaluated in the emergency room and found to have pneumonia.  Patient started on IV antibiotics and hospitalist service was consulted.  PAST MEDICAL HISTORY:   Past Medical History:  Diagnosis Date  . Atrial fibrillation ([place])   . CAD (coronary artery disease) 08/2009   BMS to RCA, non-obstructive in CX and LAD 2011  . CHF (congestive heart failure) ([place])   . Hypertension     PAST SURGICAL HISTORY:  Past Surgical History:  Procedure Laterality Date  . CESAREAN SECTION     x 1. no BTL  . CORONARY ANGIOPLASTY WITH STENT PLACEMENT    . DILATION AND CURETTAGE, DIAGNOSTIC / THERAPEUTIC     for SAB    SOCIAL HISTORY:  Social History   Tobacco Use  . Smoking status: Current Every Day Smoker    Packs/day: 0.20    Types: Cigarettes  . Smokeless tobacco: Never Used  Substance Use Topics  . Alcohol use: Yes    Alcohol/week: 2.0 standard drinks    Types: 2 Cans of beer per week    Comment: Former heavy drinker, with whiskey. Was in [place] but is not drinking beer again.,     FAMILY HISTORY:  Family History  Problem Relation Age of Onset  . CAD Mother   . Diabetes Mellitus II Father   . Hypertension Sister   . CAD Maternal Grandmother     DRUG ALLERGIES:  Allergies  Allergen Reactions   . Ace Inhibitors Cough    REVIEW OF SYSTEMS:   CONSTITUTIONAL: No fever, has fatigue and weakness.  EYES: No blurred or double vision.  EARS, NOSE, AND THROAT: No tinnitus or ear pain.  RESPIRATORY: has cough, shortness of breath, no wheezing or hemoptysis.  CARDIOVASCULAR: No chest pain, orthopnea, edema.  GASTROINTESTINAL: No nausea, vomiting, diarrhea or abdominal pain.  GENITOURINARY: No dysuria, hematuria.  ENDOCRINE: No polyuria, nocturia,  HEMATOLOGY: No anemia, easy bruising or bleeding SKIN: No rash or lesion. MUSCULOSKELETAL: No joint pain or arthritis.   NEUROLOGIC: No tingling, numbness, weakness.  PSYCHIATRY: No anxiety or depression.   MEDICATIONS AT HOME:  Prior to Admission medications   Medication Sig Start Date End Date Taking? Authorizing Provider  apixaban (ELIQUIS) 5 MG TABS tablet Take 5 mg by mouth 2 (two) times daily.   Yes [provider]  atorvastatin (LIPITOR) 20 MG tablet Take 1 tablet (20 mg total) by mouth daily. 02/08/16  Yes Donne Hazel, MD  diltiazem (CARDIZEM) 60 MG tablet Take 1 tablet (60 mg total) by mouth every 8 (eight) hours. 02/08/16  Yes Donne Hazel, MD  furosemide (LASIX) 40 MG tablet Take 1 tablet (40 mg total) by mouth daily. 02/08/16  Yes Donne Hazel, MD  metoprolol (LOPRESSOR) 100 MG tablet Take 1 tablet (100 mg total)  by mouth every 12 (twelve) hours. 02/08/16  Yes Donne Hazel, MD  metroNIDAZOLE (FLAGYL) 500 MG tablet Take two tablets by mouth twice a day, for one day.  Or you can take all four tablets at once if you can tolerate it. 06/05/18  Yes Aletha Halim, MD  spironolactone (ALDACTONE) 25 MG tablet Take 0.5 tablets (12.5 mg total) by mouth daily. 02/08/16  Yes Donne Hazel, MD      PHYSICAL EXAMINATION:   VITAL SIGNS: Blood pressure 123/87, pulse (!) 107, temperature 98.1 F (36.7 C), temperature source Oral, resp. rate (!) 23, height 5\' 3"  (1.6 m), weight 93 kg, SpO2 (!) 89 %.  GENERAL:  63  y.o.-year-old patient lying in the bed with no acute distress.  EYES: Pupils equal, round, reactive to light and accommodation. No scleral icterus. Extraocular muscles intact.  HEENT: Head atraumatic, normocephalic. Oropharynx and nasopharynx clear.  NECK:  Supple, no jugular venous distention. No thyroid enlargement, no tenderness.  LUNGS: Decreased breath sounds bilaterally, Rales heard in the right lung. No use of accessory muscles of respiration.  CARDIOVASCULAR: S1, S2 irregular. No murmurs, rubs, or gallops.  ABDOMEN: Soft, nontender, nondistended. Bowel sounds present. No organomegaly or mass.  EXTREMITIES: No pedal edema, cyanosis, or clubbing.  NEUROLOGIC: Cranial nerves II through XII are intact. Muscle strength 5/5 in all extremities. Sensation intact. Gait not checked.  PSYCHIATRIC: The patient is alert and oriented x 3.  SKIN: No obvious rash, lesion, or ulcer.   LABORATORY PANEL:   CBC Recent Labs  Lab 06/20/18 1554  WBC 10.4  HGB 15.4*  HCT 45.8  PLT 189  MCV 97.9  MCH 32.9  MCHC 33.6  RDW 13.8  LYMPHSABS 1.5  MONOABS 0.8  EOSABS 0.0  BASOSABS 0.0   ------------------------------------------------------------------------------------------------------------------  Chemistries  Recent Labs  Lab 06/20/18 1554  NA 135  K 3.2*  CL 97*  CO2 27  GLUCOSE 123*  BUN 10  CREATININE 0.81  CALCIUM 8.8*   ------------------------------------------------------------------------------------------------------------------ estimated creatinine clearance is 80 mL/min (by C-G formula based on SCr of 0.81 mg/dL). ------------------------------------------------------------------------------------------------------------------ No results for input(s): TSH, T4TOTAL, T3FREE, THYROIDAB in the last 72 hours.  Invalid input(s): FREET3   Coagulation profile No results for input(s): INR, PROTIME in the last 168  hours. ------------------------------------------------------------------------------------------------------------------- No results for input(s): DDIMER in the last 72 hours. -------------------------------------------------------------------------------------------------------------------  Cardiac Enzymes Recent Labs  Lab 06/20/18 1554  TROPONINI <0.03   ------------------------------------------------------------------------------------------------------------------ Invalid input(s): POCBNP  ---------------------------------------------------------------------------------------------------------------  Urinalysis No results found for: COLORURINE, APPEARANCEUR, LABSPEC, PHURINE, GLUCOSEU, HGBUR, BILIRUBINUR, KETONESUR, PROTEINUR, UROBILINOGEN, NITRITE, LEUKOCYTESUR   RADIOLOGY: Dg Chest 2 View  Result Date: 06/20/2018 CLINICAL DATA:  63 year old female with productive cough, worsening shortness of breath and fever. EXAM: CHEST - 2 VIEW COMPARISON:  None. FINDINGS: Mild nonspecific patchy airspace opacity in the right middle lobe partially obscuring the right cardiac margin on the frontal view. The findings are not well seen on the lateral view but are concerning for right middle lobe bronchopneumonia. Mild cardiomegaly. Atherosclerotic calcifications present in the transverse aorta. Minimal vascular congestion without overt edema. The lungs are slightly hyperinflated with diffuse mild bronchitic changes. No evidence of acute osseous abnormality. IMPRESSION: 1. Patchy right middle lobe airspace opacity concerning for bronchopneumonia given the clinical history. Followup PA and lateral chest X-ray is recommended in 3-4 weeks following trial of antibiotic therapy to ensure resolution and exclude underlying malignancy. 2. Cardiomegaly and mild vascular congestion without overt edema. 3. Pulmonary hyperinflation and diffuse mild bronchitic changes suggests  underlying COPD. 4.  Aortic  Atherosclerosis (ICD10-170.0) Electronically Signed   By: Jacqulynn Cadet M.D.   On: 06/20/2018 16:34    EKG: Orders placed or performed during the hospital encounter of 06/20/18  . ED EKG  . EKG 12-Lead  . EKG 12-Lead  . ED EKG    IMPRESSION AND PLAN: 63 year old female patient with a known history of atrial fibrillation, coronary artery disease, congestive heart failure, hypertension presented to the emergency room for shortness of breath and cough.    -Community-acquired pneumonia Admit patient to medical service with cardiac monitoring .Patient on IV Rocephin and Zithromax antibiotics Follow-up cultures  -Atrial fibrillation Continue anticoagulation with oral Eliquis Continue diltiazem for rate control  -Chronic congestive heart failure Stable Continue Lasix for diuresis Continue oral Aldactone  -Coronary artery disease Continue metoprolol Continue Lipitor  All the records are reviewed and case discussed with ED provider. Management plans discussed with the patient, family and they are in agreement.  CODE STATUS:Full code Code Status History    Date Active Date Inactive Code Status Order ID Comments User Context   02/03/2016 0605 02/08/2016 1633 Full Code [id number]  Oswald Hillock, MD Inpatient       TOTAL TIME TAKING CARE OF THIS PATIENT: 53 minutes.    Saundra Shelling M.D on 06/20/2018 at 7:00 PM  Between 7am to 6pm - Pager - [phone]  After 6pm go to [email or web address] - password [email or web address] [place] [facility]  Office  [phone]  CC: Primary care physician; Mardi Mainland, FNP

## 2018-06-20 NOTE — ED Triage Notes (Signed)
Pt in via POV, reports productive cough, worsening shortness of breath and fever since Monday.  Pt hypoxic on room air 89%, tachypneic, tachycardic upon arrival.

## 2018-06-20 NOTE — ED Notes (Signed)
Patient transported to X-ray 

## 2018-06-20 NOTE — ED Provider Notes (Signed)
[facility] Emergency Department Provider Note  ____________________________________________  Time seen: Approximately 4:57 PM  I have reviewed the triage vital signs and the nursing notes.   HISTORY  Chief Complaint Shortness of Breath   HPI Anyia Gierke [name] is a 63 y.o. female with a history of heart failure with EF of 50 to 55%, atrial fibrillation on Eliquis, CAD, smoking, and hypertension who presents for evaluation of shortness of breath and cough.  Patient reports 2 days of cough productive of yellow phlegm, progressively worsening shortness of breath and wheezing.  She has had a fever of 100.68F today for which she took Tylenol.  She reports receiving the flu shot but her PCP was out of the pneumonia shot.  Patient has been using her inhaler at home with improvement.  Today her wheezing became more severe and she developed a fever which prompted her visit to the emergency room.  No personal or family history of blood clots, recent travel immobilization, leg pain or swelling, hemoptysis, or exogenous hormones.  No chest pain.  She endorses compliance with her Eliquis.  She does not have a history of diagnosed COPD but does use inhalers at home as needed for wheezing. She continues to smoke.  No abdominal pain, nausea, vomiting, diarrhea.   Past Medical History:  Diagnosis Date  . Atrial fibrillation ([place])   . CAD (coronary artery disease) 08/2009   BMS to RCA, non-obstructive in CX and LAD 2011  . CHF (congestive heart failure) ([place])   . Hypertension     Patient Active Problem List   Diagnosis Date Noted  . Trichomoniasis 06/05/2018  . Postmenopausal bleeding 05/29/2018  . Acute on chronic diastolic CHF (congestive heart failure) ([place])   . Acute on chronic congestive heart failure ([place]) 02/03/2016  . CHF (congestive heart failure), NYHA class I ([place]) 02/03/2016  . Essential hypertension 02/03/2016  . Atrial fibrillation ([place]) 02/03/2016  .  Atrial flutter [facility])     Past Surgical History:  Procedure Laterality Date  . CESAREAN SECTION     x 1. no BTL  . CORONARY ANGIOPLASTY WITH STENT PLACEMENT    . DILATION AND CURETTAGE, DIAGNOSTIC / THERAPEUTIC     for SAB    Prior to Admission medications   Medication Sig Start Date End Date Taking? Authorizing Provider  apixaban (ELIQUIS) 5 MG TABS tablet Take 5 mg by mouth 2 (two) times daily.    [provider]  atorvastatin (LIPITOR) 20 MG tablet Take 1 tablet (20 mg total) by mouth daily. 02/08/16   Donne Hazel, MD  diltiazem (CARDIZEM) 60 MG tablet Take 1 tablet (60 mg total) by mouth every 8 (eight) hours. 02/08/16   Donne Hazel, MD  furosemide (LASIX) 40 MG tablet Take 1 tablet (40 mg total) by mouth daily. 02/08/16   Donne Hazel, MD  metoprolol (LOPRESSOR) 100 MG tablet Take 1 tablet (100 mg total) by mouth every 12 (twelve) hours. 02/08/16   Donne Hazel, MD  metroNIDAZOLE (FLAGYL) 500 MG tablet Take two tablets by mouth twice a day, for one day.  Or you can take all four tablets at once if you can tolerate it. 06/05/18   Aletha Halim, MD  spironolactone (ALDACTONE) 25 MG tablet Take 0.5 tablets (12.5 mg total) by mouth daily. 02/08/16   Donne Hazel, MD    Allergies Ace inhibitors  Family History  Problem Relation Age of Onset  . CAD Mother   . Diabetes Mellitus II  Father   . Hypertension Sister   . CAD Maternal Grandmother     Social History Social History   Tobacco Use  . Smoking status: Current Every Day Smoker    Packs/day: 0.20    Types: Cigarettes  . Smokeless tobacco: Never Used  Substance Use Topics  . Alcohol use: Yes    Alcohol/week: 2.0 standard drinks    Types: 2 Cans of beer per week    Comment: Former heavy drinker, with whiskey. Was in [place] but is not drinking beer again.,   . Drug use: Yes    Types: Marijuana    Review of Systems  Constitutional: +fever. Eyes: Negative for visual changes. ENT: Negative for sore  throat. Neck: No neck pain  Cardiovascular: Negative for chest pain. Respiratory: +shortness of breath and cough Gastrointestinal: Negative for abdominal pain, vomiting or diarrhea. Genitourinary: Negative for dysuria. Musculoskeletal: Negative for back pain. Skin: Negative for rash. Neurological: Negative for headaches, weakness or numbness. Psych: No SI or HI  ____________________________________________   PHYSICAL EXAM:  VITAL SIGNS: ED Triage Vitals  Enc Vitals Group     BP 06/20/18 1541 124/84     Pulse Rate 06/20/18 1541 (!) 106     Resp 06/20/18 1541 (!) 28     Temp 06/20/18 1541 97.8 F (36.6 C)     Temp Source 06/20/18 1541 Oral     SpO2 06/20/18 1541 91 %     Weight 06/20/18 1542 205 lb (93 kg)     Height 06/20/18 1542 5\' 3"  (1.6 m)     Head Circumference --      Peak Flow --      Pain Score 06/20/18 1542 0     Pain Loc --      Pain Edu? --      Excl. in [place]? --     Constitutional: Alert and oriented, moderate respiratory distress.  HEENT:      Head: Normocephalic and atraumatic.         Eyes: Conjunctivae are normal. Sclera is non-icteric.       Mouth/Throat: Mucous membranes are moist.       Neck: Supple with no signs of meningismus. Cardiovascular: Irregularly irregular rhythm with tachycardic rate. No murmurs, gallops, or rubs. 2+ symmetrical distal pulses are present in all extremities. No JVD. Respiratory: Increased work of breathing, tachypneic, hypoxic on room air with diffuse wheezing bilaterally Gastrointestinal: Soft, non tender, and non distended with positive bowel sounds. No rebound or guarding. Musculoskeletal: Nontender with normal range of motion in all extremities. No edema, cyanosis, or erythema of extremities. Neurologic: Normal speech and language. Face is symmetric. Moving all extremities. No gross focal neurologic deficits are appreciated. Skin: Skin is warm, dry and intact. No rash noted. Psychiatric: Mood and affect are normal. Speech  and behavior are normal.  ____________________________________________   LABS (all labs ordered are listed, but only abnormal results are displayed)  Labs Reviewed  BASIC METABOLIC PANEL - Abnormal; Notable for the following components:      Result Value   Potassium 3.2 (*)    Chloride 97 (*)    Glucose, Bld 123 (*)    Calcium 8.8 (*)    All other components within normal limits  BRAIN NATRIURETIC PEPTIDE - Abnormal; Notable for the following components:   B Natriuretic Peptide 458.0 (*)    All other components within normal limits  CBC WITH DIFFERENTIAL/PLATELET - Abnormal; Notable for the following components:   Hemoglobin 15.4 (*)  Neutro Abs 8.0 (*)    All other components within normal limits  TROPONIN I  INFLUENZA PANEL BY PCR (TYPE A & B)   ____________________________________________  EKG  ED ECG REPORT I, Rudene Re, the attending physician, personally viewed and interpreted this ECG.  Atrial fibrillation with ventricular rate of 110, normal QTC, normal axis, no ST elevations or depressions.  Unchanged from prior ____________________________________________  RADIOLOGY  I have personally reviewed the images performed during this visit and I agree with the Radiologist's read.   Interpretation by Radiologist:  Dg Chest 2 View  Result Date: 06/20/2018 CLINICAL DATA:  63 year old female with productive cough, worsening shortness of breath and fever. EXAM: CHEST - 2 VIEW COMPARISON:  None. FINDINGS: Mild nonspecific patchy airspace opacity in the right middle lobe partially obscuring the right cardiac margin on the frontal view. The findings are not well seen on the lateral view but are concerning for right middle lobe bronchopneumonia. Mild cardiomegaly. Atherosclerotic calcifications present in the transverse aorta. Minimal vascular congestion without overt edema. The lungs are slightly hyperinflated with diffuse mild bronchitic changes. No evidence of acute  osseous abnormality. IMPRESSION: 1. Patchy right middle lobe airspace opacity concerning for bronchopneumonia given the clinical history. Followup PA and lateral chest X-ray is recommended in 3-4 weeks following trial of antibiotic therapy to ensure resolution and exclude underlying malignancy. 2. Cardiomegaly and mild vascular congestion without overt edema. 3. Pulmonary hyperinflation and diffuse mild bronchitic changes suggests underlying COPD. 4.  Aortic Atherosclerosis (ICD10-170.0) Electronically Signed   By: Jacqulynn Cadet M.D.   On: 06/20/2018 16:34     ____________________________________________   PROCEDURES  Procedure(s) performed: None Procedures Critical Care performed: yes   CRITICAL CARE Performed by: Rudene Re  ?  Total critical care time: 40 min  Critical care time was exclusive of separately billable procedures and treating other patients.  Critical care was necessary to treat or prevent imminent or life-threatening deterioration.  Critical care was time spent personally by me on the following activities: development of treatment plan with patient and/or surrogate as well as nursing, discussions with consultants, evaluation of patient's response to treatment, examination of patient, obtaining history from patient or surrogate, ordering and performing treatments and interventions, ordering and review of laboratory studies, ordering and review of radiographic studies, pulse oximetry and re-evaluation of patient's condition.  ____________________________________________   INITIAL IMPRESSION / ASSESSMENT AND PLAN / ED COURSE   63 y.o. female with a history of heart failure with EF of 50 to 55%, atrial fibrillation on Eliquis, CAD, smoking, and hypertension who presents for evaluation of shortness of breath, fever, wheezing, and cough.  Patient in mild respiratory distress with new oxygen requirement, diffuse wheezing throughout.  Chest x-ray concerning for  pneumonia.  Patient is afebrile here but took Tylenol earlier today.  She is currently in A. fib but with ventricular rate between upper 90s and low 110s.  Patient has normal white count.  Slightly increased BNP with chest x-ray showing cardiomegaly and mild pulmonary edema.  Will give an extra dose of Lasix.  Will start patient on DuoNeb's and Solu-Medrol, will give Rocephin and doxycycline for pneumonia.  Will give patient his evening dose of Cardizem at this time to prevent her from developing RVR in the setting of several doses of albuterol.  Influenza is pending.  Clinical Course as of Jun 20 1834  Wed Jun 20, 2018  1835 Patient with hypoxia and tachycardia with minimal movement in the room.  Continues to have  new oxygen requirement.  Flu is negative.  Discussed with Dr. Estanislado Pandy for admission.   [CV]    Clinical Course User Index [CV] Alfred Levins [place], MD     As part of my medical decision making, I reviewed the following data within the [facility] notes reviewed and incorporated, Labs reviewed , EKG interpreted , Old EKG reviewed, Old chart reviewed, Radiograph reviewed , Discussed with admitting physician , Notes from prior ED visits and [place] Controlled Substance Database    Pertinent labs & imaging results that were available during my care of the patient were reviewed by me and considered in my medical decision making (see chart for details).    ____________________________________________   FINAL CLINICAL IMPRESSION(S) / ED DIAGNOSES  Final diagnoses:  Acute respiratory failure with hypoxia ([place])  Community acquired pneumonia, unspecified laterality  Acute on chronic congestive heart failure, unspecified heart failure type ([place])  Atrial fibrillation with RVR ([place])      NEW MEDICATIONS STARTED DURING THIS VISIT:  ED Discharge Orders    None       Note:  This document was prepared using Dragon voice recognition software and may include  unintentional dictation errors.    Alfred Levins, [place], MD 06/20/18 [phone]

## 2018-06-21 LAB — BASIC METABOLIC PANEL
Anion gap: 10 (ref 5–15)
BUN: 15 mg/dL (ref 6–20)
CO2: 27 mmol/L (ref 22–32)
Calcium: 8.7 mg/dL — ABNORMAL LOW (ref 8.9–10.3)
Chloride: 98 mmol/L (ref 98–111)
Creatinine, Ser: 0.73 mg/dL (ref 0.44–1.00)
GFR calc non Af Amer: >60 mL/min (ref >60)
Glucose, Bld: 173 mg/dL — ABNORMAL HIGH (ref 70–99)
Potassium: 3.7 mmol/L (ref 3.5–5.1)
SODIUM: 135 mmol/L (ref 135–145)

## 2018-06-21 LAB — CBC
HCT: 42.4 % (ref 36.0–46.0)
Hemoglobin: 14.2 g/dL (ref 12.0–15.0)
MCH: 32.6 pg (ref 26.0–34.0)
MCHC: 33.5 g/dL (ref 30.0–36.0)
MCV: 97.5 fL (ref 80.0–100.0)
NRBC: 0 % (ref 0.0–0.2)
Platelets: 176 10*3/uL (ref 150–400)
RBC: 4.35 MIL/uL (ref 3.87–5.11)
RDW: 13.8 % (ref 11.5–15.5)
WBC: 6.9 10*3/uL (ref 4.0–10.5)

## 2018-06-21 MED ORDER — METHYLPREDNISOLONE SODIUM SUCC 125 MG IJ SOLR
60.0000 mg | Freq: Two times a day (BID) | INTRAMUSCULAR | Status: DC
  Administered 2018-06-21 – 2018-06-24 (×7): 60 mg via INTRAVENOUS
  Filled 2018-06-21 (×7): qty 2

## 2018-06-21 MED ORDER — ENSURE MAX PROTEIN PO LIQD
11.0000 [oz_av] | Freq: Two times a day (BID) | ORAL | Status: DC
  Administered 2018-06-21 – 2018-06-24 (×7): 11 [oz_av] via ORAL
  Filled 2018-06-21: qty 330

## 2018-06-21 MED ORDER — IPRATROPIUM-ALBUTEROL 0.5-2.5 (3) MG/3ML IN SOLN
3.0000 mL | Freq: Four times a day (QID) | RESPIRATORY_TRACT | Status: DC
  Administered 2018-06-21 – 2018-06-23 (×6): 3 mL via RESPIRATORY_TRACT
  Filled 2018-06-21 (×7): qty 3

## 2018-06-21 NOTE — Progress Notes (Signed)
Dr Darvin Neighbours states that he ordered breathing treatment and IV solumedrol for patient, RN notified MD that patient's HR is still high at times, want to clarify when should call me MD especially with steroids and breathing treatment on board.  Dr Darvin Neighbours states "if it stays at 130s, without coming back down, call me"

## 2018-06-21 NOTE — Progress Notes (Signed)
Initial Nutrition Assessment  DOCUMENTATION CODES:   Obesity unspecified  INTERVENTION:   Ensure Max protein supplement BID, each supplement provides 150kcal and 30g of protein.  NUTRITION DIAGNOSIS:   Inadequate oral intake related to acute illness as evidenced by per patient/family report.  GOAL:   Patient will meet greater than or equal to 90% of their needs  MONITOR:   Labs, PO intake, Supplement acceptance, Weight trends, Skin, I & O's  REASON FOR ASSESSMENT:   Malnutrition Screening Tool    ASSESSMENT:   63 y/o female wiht h/o CHF admitted with PNA   Met with pt in room today. Pt reports good appetite and oral intake at baseline but reports poor oral intake for 5 days pta. Pt reports her appetite is improving today; pt eating 50% of meals. Pt is willing to drink chocolate Ensure until her appetite improves. Per chart, pt is weight stable.   Medications reviewed and include: lasix, solu-medrol, aldactone, azithromycin, ceftriaxone   Labs reviewed:   NUTRITION - FOCUSED PHYSICAL EXAM:    Most Recent Value  Orbital Region  No depletion  Upper Arm Region  No depletion  Thoracic and Lumbar Region  No depletion  Buccal Region  No depletion  Temple Region  No depletion  Clavicle Bone Region  No depletion  Clavicle and Acromion Bone Region  No depletion  Scapular Bone Region  No depletion  Dorsal Hand  No depletion  Patellar Region  No depletion  Anterior Thigh Region  No depletion  Posterior Calf Region  No depletion  Edema (RD Assessment)  Mild  Hair  Reviewed  Eyes  Reviewed  Mouth  Reviewed  Skin  Reviewed  Nails  Reviewed     Diet Order:   Diet Order            Diet Heart Room service appropriate? Yes; Fluid consistency: Thin  Diet effective now             EDUCATION NEEDS:   Education needs have been addressed  Skin:  Skin Assessment: Reviewed RN Assessment  Last BM:  pta  Height:   Ht Readings from Last 1 Encounters:  06/20/18 5' 3"   (1.6 m)    Weight:   Wt Readings from Last 1 Encounters:  06/20/18 91.9 kg    Ideal Body Weight:  52.3 kg  BMI:  Body mass index is 35.89 kg/m.  Estimated Nutritional Needs:   Kcal:  1800-2100kcal/day   Protein:  92-101g/day   Fluid:  >1.6L/day   Koleen Distance MS, RD, LDN Pager #- [phone] Office#- [phone] After Hours Pager: [phone]

## 2018-06-21 NOTE — Plan of Care (Signed)
  Problem: Activity: Goal: Ability to tolerate increased activity will improve Outcome: Progressing   Problem: Clinical Measurements: Goal: Ability to maintain a body temperature in the normal range will improve Outcome: Progressing   Problem: Respiratory: Goal: Ability to maintain adequate ventilation will improve Outcome: Progressing Goal: Ability to maintain a clear airway will improve Outcome: Progressing   

## 2018-06-21 NOTE — Progress Notes (Signed)
Dr Darvin Neighbours at bedside, RN notified MD that is afib HR is still on high side 120-130s at times, RN gave PO metorolol this am.  RN also notified MD that patient prefer ted hose over SCD.  MD gave order to discontinue SCD and apply ted hose.

## 2018-06-21 NOTE — Progress Notes (Signed)
Rhonda [name] at [place] NAME: Rhonda [name]    MR#:  [id number]  DATE OF BIRTH:  [birth year]  SUBJECTIVE:  CHIEF COMPLAINT:   Chief Complaint  Patient presents with  . Shortness of Breath   Continues to have shortness of breath but slowly improving.  Cough with brown sputum.  Not on oxygen.  Rapid ventricular rate with atrial fibrillation on telemetry.  REVIEW OF SYSTEMS:    Review of Systems  Constitutional: Positive for malaise/fatigue. Negative for chills and fever.  HENT: Negative for sore throat.   Eyes: Negative for blurred vision, double vision and pain.  Respiratory: Positive for cough, sputum production, shortness of breath and wheezing. Negative for hemoptysis.   Cardiovascular: Negative for chest pain, palpitations, orthopnea and leg swelling.  Gastrointestinal: Negative for abdominal pain, constipation, diarrhea, heartburn, nausea and vomiting.  Genitourinary: Negative for dysuria and hematuria.  Musculoskeletal: Negative for back pain and joint pain.  Skin: Negative for rash.  Neurological: Negative for sensory change, speech change, focal weakness and headaches.  Endo/Heme/Allergies: Does not bruise/bleed easily.  Psychiatric/Behavioral: Negative for depression. The patient is not nervous/anxious.     DRUG ALLERGIES:   Allergies  Allergen Reactions  . Ace Inhibitors Cough    VITALS:  Blood pressure (!) 108/97, pulse (!) 134, temperature (!) 97.5 F (36.4 C), temperature source Oral, resp. rate 18, height 5\' 3"  (1.6 m), weight 91.9 kg, SpO2 95 %.  PHYSICAL EXAMINATION:   Physical Exam  GENERAL:  63 y.o.-year-old patient lying in the bed with no acute distress.  EYES: Pupils equal, round, reactive to light and accommodation. No scleral icterus. Extraocular muscles intact.  HEENT: Head atraumatic, normocephalic. Oropharynx and nasopharynx clear.  NECK:  Supple, no jugular venous distention. No thyroid enlargement,  no tenderness.  LUNGS: Bilateral wheezing and decreased air entry CARDIOVASCULAR: Irregularly irregular with tachycardia ABDOMEN: Soft, nontender, nondistended. Bowel sounds present. No organomegaly or mass.  EXTREMITIES: No cyanosis, clubbing or edema b/l.    NEUROLOGIC: Cranial nerves II through XII are intact. No focal Motor or sensory deficits b/l.   PSYCHIATRIC: The patient is alert and oriented x 3.  SKIN: No obvious rash, lesion, or ulcer.   LABORATORY PANEL:   CBC Recent Labs  Lab 06/21/18 0546  WBC 6.9  HGB 14.2  HCT 42.4  PLT 176   ------------------------------------------------------------------------------------------------------------------ Chemistries  Recent Labs  Lab 06/21/18 0546  NA 135  K 3.7  CL 98  CO2 27  GLUCOSE 173*  BUN 15  CREATININE 0.73  CALCIUM 8.7*   ------------------------------------------------------------------------------------------------------------------  Cardiac Enzymes Recent Labs  Lab 06/20/18 1554  TROPONINI <0.03   ------------------------------------------------------------------------------------------------------------------  RADIOLOGY:  Dg Chest 2 View  Result Date: 06/20/2018 CLINICAL DATA:  63 year old female with productive cough, worsening shortness of breath and fever. EXAM: CHEST - 2 VIEW COMPARISON:  None. FINDINGS: Mild nonspecific patchy airspace opacity in the right middle lobe partially obscuring the right cardiac margin on the frontal view. The findings are not well seen on the lateral view but are concerning for right middle lobe bronchopneumonia. Mild cardiomegaly. Atherosclerotic calcifications present in the transverse aorta. Minimal vascular congestion without overt edema. The lungs are slightly hyperinflated with diffuse mild bronchitic changes. No evidence of acute osseous abnormality. IMPRESSION: 1. Patchy right middle lobe airspace opacity concerning for bronchopneumonia given the clinical history.  Followup PA and lateral chest X-ray is recommended in 3-4 weeks following trial of antibiotic therapy to ensure resolution and exclude underlying malignancy.  2. Cardiomegaly and mild vascular congestion without overt edema. 3. Pulmonary hyperinflation and diffuse mild bronchitic changes suggests underlying COPD. 4.  Aortic Atherosclerosis (ICD10-170.0) Electronically Signed   By: Jacqulynn Cadet M.D.   On: 06/20/2018 16:34   ASSESSMENT AND PLAN:   63 year old female patient with a known history of atrial fibrillation, coronary artery disease, congestive heart failure, hypertension presented to the emergency room for shortness of breath and cough.    -Right middle lobe community-acquired pneumonia IV Rocephin and Zithromax.  Sputum cultures.  Blood cultures pending. Has significant wheezing and will start steroids , added scheduled nebulizers.  Albuterol as needed  -Atrial fibrillation rapid ventricular rate secondary to respiratory issues Continue anticoagulation with oral Eliquis Continue diltiazem and metoprolol for rate control.  -Chronic congestive heart failure Stable Continue Lasix for diuresis Continue oral Aldactone  -Coronary artery disease Metoprolol, Lipitor, ASA  All the records are reviewed and case discussed with Care Management/Social Worker Management plans discussed with the patient, family and they are in agreement.  CODE STATUS: FULL CODE  DVT Prophylaxis: SCDs  TOTAL TIME TAKING CARE OF THIS PATIENT: 35 minutes.   POSSIBLE D/C IN 1-2 DAYS, DEPENDING ON CLINICAL CONDITION.  Rhonda [name] M.D on 06/21/2018 at 2:42 PM  Between 7am to 6pm - Pager - [phone]  After 6pm go to [email or web address] - password [email or web address] [facility]  Office  [phone]  CC: Primary care physician; Mardi Mainland, FNP  Note: This dictation was prepared with Dragon dictation along with smaller phrase technology. Any transcriptional errors that result  from this process are unintentional.

## 2018-06-22 ENCOUNTER — Inpatient Hospital Stay: Payer: Medicare HMO

## 2018-06-22 LAB — HIV ANTIBODY (ROUTINE TESTING W REFLEX): HIV Screen 4th Generation wRfx: NONREACTIVE

## 2018-06-22 MED ORDER — AZITHROMYCIN 250 MG PO TABS
500.0000 mg | ORAL_TABLET | Freq: Every day | ORAL | Status: AC
  Administered 2018-06-22: 500 mg via ORAL
  Filled 2018-06-22: qty 2

## 2018-06-22 NOTE — Plan of Care (Signed)
  Problem: Activity: Goal: Ability to tolerate increased activity will improve Outcome: Progressing   Problem: Clinical Measurements: Goal: Ability to maintain a body temperature in the normal range will improve Outcome: Progressing   Problem: Respiratory: Goal: Ability to maintain adequate ventilation will improve Outcome: Progressing   

## 2018-06-22 NOTE — Care Management Important Message (Signed)
Important Message  Patient Details  Name: Rhonda [name] MRN: [id number] Date of Birth: July 31, 1955   Medicare Important Message Given:  Yes    Elza Rafter, RN 06/22/2018, 5:23 PM

## 2018-06-22 NOTE — Progress Notes (Signed)
[facility] [place] NAME: Rhonda [name]    MR#:  [id number]  DATE OF BIRTH:  [birth year]  SUBJECTIVE:  CHIEF COMPLAINT:   Chief Complaint  Patient presents with  . Shortness of Breath   Afebrile.  Not on oxygen. Shortness of breath is present but improved.  Productive cough with brown sputum Up and ambulating some in the room but short of breath.  REVIEW OF SYSTEMS:    Review of Systems  Constitutional: Positive for malaise/fatigue. Negative for chills and fever.  HENT: Negative for sore throat.   Eyes: Negative for blurred vision, double vision and pain.  Respiratory: Positive for cough, sputum production, shortness of breath and wheezing. Negative for hemoptysis.   Cardiovascular: Negative for chest pain, palpitations, orthopnea and leg swelling.  Gastrointestinal: Negative for abdominal pain, constipation, diarrhea, heartburn, nausea and vomiting.  Genitourinary: Negative for dysuria and hematuria.  Musculoskeletal: Negative for back pain and joint pain.  Skin: Negative for rash.  Neurological: Negative for sensory change, speech change, focal weakness and headaches.  Endo/Heme/Allergies: Does not bruise/bleed easily.  Psychiatric/Behavioral: Negative for depression. The patient is not nervous/anxious.    DRUG ALLERGIES:   Allergies  Allergen Reactions  . Ace Inhibitors Cough    VITALS:  Blood pressure 104/88, pulse 68, temperature 97.8 F (36.6 C), temperature source Oral, resp. rate 18, height 5\' 3"  (1.6 m), weight 91.9 kg, SpO2 95 %.  PHYSICAL EXAMINATION:   Physical Exam  GENERAL:  63 y.o.-year-old patient lying in the bed with no acute distress.  EYES: Pupils equal, round, reactive to light and accommodation. No scleral icterus. Extraocular muscles intact.  HEENT: Head atraumatic, normocephalic. Oropharynx and nasopharynx clear.  NECK:  Supple, no jugular venous distention. No thyroid enlargement, no tenderness.   LUNGS: Bilateral wheezing and decreased air entry CARDIOVASCULAR: Irregularly irregular with tachycardia ABDOMEN: Soft, nontender, nondistended. Bowel sounds present. No organomegaly or mass.  EXTREMITIES: No cyanosis, clubbing or edema b/l.    NEUROLOGIC: Cranial nerves II through XII are intact. No focal Motor or sensory deficits b/l.   PSYCHIATRIC: The patient is alert and oriented x 3.  SKIN: No obvious rash, lesion, or ulcer.   LABORATORY PANEL:   CBC Recent Labs  Lab 06/21/18 0546  WBC 6.9  HGB 14.2  HCT 42.4  PLT 176   ------------------------------------------------------------------------------------------------------------------ Chemistries  Recent Labs  Lab 06/21/18 0546  NA 135  K 3.7  CL 98  CO2 27  GLUCOSE 173*  BUN 15  CREATININE 0.73  CALCIUM 8.7*   ------------------------------------------------------------------------------------------------------------------  Cardiac Enzymes Recent Labs  Lab 06/20/18 1554  TROPONINI <0.03   ------------------------------------------------------------------------------------------------------------------  RADIOLOGY:  Dg Chest 2 View  Result Date: 06/20/2018 CLINICAL DATA:  63 year old female with productive cough, worsening shortness of breath and fever. EXAM: CHEST - 2 VIEW COMPARISON:  None. FINDINGS: Mild nonspecific patchy airspace opacity in the right middle lobe partially obscuring the right cardiac margin on the frontal view. The findings are not well seen on the lateral view but are concerning for right middle lobe bronchopneumonia. Mild cardiomegaly. Atherosclerotic calcifications present in the transverse aorta. Minimal vascular congestion without overt edema. The lungs are slightly hyperinflated with diffuse mild bronchitic changes. No evidence of acute osseous abnormality. IMPRESSION: 1. Patchy right middle lobe airspace opacity concerning for bronchopneumonia given the clinical history. Followup PA and  lateral chest X-ray is recommended in 3-4 weeks following trial of antibiotic therapy to ensure resolution and exclude underlying malignancy. 2. Cardiomegaly  and mild vascular congestion without overt edema. 3. Pulmonary hyperinflation and diffuse mild bronchitic changes suggests underlying COPD. 4.  Aortic Atherosclerosis (ICD10-170.0) Electronically Signed   By: Jacqulynn Cadet M.D.   On: 06/20/2018 16:34   ASSESSMENT AND PLAN:   63 year old female patient with a known history of atrial fibrillation, coronary artery disease, congestive heart failure, hypertension presented to the emergency room for shortness of breath and cough.    -Right middle lobe community-acquired pneumonia IV Rocephin and Zithromax.  Sputum cultures.  Blood cultures pending. Also on steroids due to significant wheezing.  Scheduled nebulizers  No diagnosis of COPD but with her smoking history likely has underlying COPD. Will need pulmonary follow-up after discharge for lung function study  -Atrial fibrillation rapid ventricular rate secondary to respiratory issues Continue anticoagulation with oral Eliquis Continue diltiazem and metoprolol for rate control.  -Chronic congestive heart failure Stable Continue Lasix for diuresis Continue oral Aldactone  -Coronary artery disease Metoprolol, Lipitor, ASA  -Vaginal bleeding.  Follows with obstetrics.  Was supposed to have ultrasound of the pelvis.  Ordered.  Needs outpatient follow-up. Stable hemoglobin  All the records are reviewed and case discussed with Care Management/Social Worker Management plans discussed with the patient, family and they are in agreement.  CODE STATUS: FULL CODE  DVT Prophylaxis: SCDs  TOTAL TIME TAKING CARE OF THIS PATIENT: 35 minutes.   POSSIBLE D/C IN 1-2 DAYS, DEPENDING ON CLINICAL CONDITION.  Leia Alf Toribio Seiber M.D on 06/22/2018 at 12:43 PM  Between 7am to 6pm - Pager - [phone]  After 6pm go to [email or web address] - password  [email or web address]  Office  [phone]  CC: Primary care physician; Mardi Mainland, FNP  Note: This dictation was prepared with Dragon dictation along with smaller phrase technology. Any transcriptional errors that result from this process are unintentional.

## 2018-06-22 NOTE — Care Management Note (Signed)
Case Management Note  Patient Details  Name: Rhonda [name] MRN: [id number] Date of Birth: 08/12/1955  Subjective/Objective:    Patient admitted with PNA.  Receiving IV steroids, antibiotic's and nebulizer's.   Chronic metoprolol and Eliquis for A-fib.  Denies difficulties accessing medical care or obtaining medications.  Current with her PCP.  Currently on room air.  Patient states plan is to discharge to home tomorrow.  Independent with all ADL's.  Husband will transport to home.  No needs identified by [facility]                 Action/Plan:   Expected Discharge Date:                  Expected Discharge Plan:  Home/Self Care  In-House Referral:     Discharge planning Services  CM Consult  Post Acute Care Choice:    Choice offered to:     DME Arranged:    DME Agency:     HH Arranged:    HH Agency:     Status of Service:  Completed, signed off  If discussed at [employer] of Stay Meetings, dates discussed:    Additional Comments:  Elza Rafter, RN 06/22/2018, 12:29 PM

## 2018-06-23 DIAGNOSIS — I4821 Permanent atrial fibrillation: Secondary | ICD-10-CM

## 2018-06-23 MED ORDER — DILTIAZEM HCL ER COATED BEADS 180 MG PO CP24
360.0000 mg | ORAL_CAPSULE | Freq: Every day | ORAL | Status: DC
  Administered 2018-06-23 – 2018-06-24 (×2): 360 mg via ORAL
  Filled 2018-06-23 (×2): qty 2

## 2018-06-23 MED ORDER — IPRATROPIUM-ALBUTEROL 0.5-2.5 (3) MG/3ML IN SOLN
3.0000 mL | Freq: Three times a day (TID) | RESPIRATORY_TRACT | Status: DC
  Administered 2018-06-23 – 2018-06-24 (×4): 3 mL via RESPIRATORY_TRACT
  Filled 2018-06-23 (×4): qty 3

## 2018-06-23 NOTE — Progress Notes (Addendum)
Patient's heart rate went up while she was in the bathroom.  MD notified.  Her Cardizem is due at 2pm. RN will continue to monitor her.  Phillis Knack, RN  Later: MD said to give her normal dose of Cardizem now.  RN gave it and documented it.  Phillis Knack, RN

## 2018-06-23 NOTE — Progress Notes (Signed)
[facility] [place] NAME: Rhonda [name]    MR#:  [id number]  DATE OF BIRTH:  06/13/1955  SUBJECTIVE:  CHIEF COMPLAINT:   Chief Complaint  Patient presents with  . Shortness of Breath   Afebrile.  Not on oxygen.  no wheezing, feeling much better but still tachycardic with heart rate up to 125 bpm. REVIEW OF SYSTEMS:    Review of Systems  Constitutional: Positive for malaise/fatigue. Negative for chills and fever.  HENT: Negative for sore throat.   Eyes: Negative for blurred vision, double vision and pain.  Respiratory: Positive for cough and sputum production. Negative for hemoptysis, shortness of breath and wheezing.   Cardiovascular: Negative for chest pain, palpitations, orthopnea and leg swelling.  Gastrointestinal: Negative for abdominal pain, constipation, diarrhea, heartburn, nausea and vomiting.  Genitourinary: Negative for dysuria and hematuria.  Musculoskeletal: Negative for back pain and joint pain.  Skin: Negative for rash.  Neurological: Negative for sensory change, speech change, focal weakness and headaches.  Endo/Heme/Allergies: Does not bruise/bleed easily.  Psychiatric/Behavioral: Negative for depression. The patient is not nervous/anxious.    DRUG ALLERGIES:   Allergies  Allergen Reactions  . Ace Inhibitors Cough    VITALS:  Blood pressure 115/83, pulse (!) 125, temperature 97.6 F (36.4 C), temperature source Oral, resp. rate 18, height 5\' 3"  (1.6 m), weight 95 kg, SpO2 94 %.  PHYSICAL EXAMINATION:   Physical Exam  GENERAL:  63 y.o.-year-old patient lying in the bed with no acute distress.  EYES: Pupils equal, round, reactive to light and accommodation. No scleral icterus. Extraocular muscles intact.  HEENT: Head atraumatic, normocephalic. Oropharynx and nasopharynx clear.  NECK:  Supple, no jugular venous distention. No thyroid enlargement, no tenderness.  LUNGS: Diminished air entry at  bases CARDIOVASCULAR: Irregularly irregular with tachycardia ABDOMEN: Soft, nontender, nondistended. Bowel sounds present. No organomegaly or mass.  EXTREMITIES: No cyanosis, clubbing or edema b/l.    NEUROLOGIC: Cranial nerves II through XII are intact. No focal Motor or sensory deficits b/l.   PSYCHIATRIC: The patient is alert and oriented x 3.  SKIN: No obvious rash, lesion, or ulcer.   LABORATORY PANEL:   CBC Recent Labs  Lab 06/21/18 0546  WBC 6.9  HGB 14.2  HCT 42.4  PLT 176   ------------------------------------------------------------------------------------------------------------------ Chemistries  Recent Labs  Lab 06/21/18 0546  NA 135  K 3.7  CL 98  CO2 27  GLUCOSE 173*  BUN 15  CREATININE 0.73  CALCIUM 8.7*   ------------------------------------------------------------------------------------------------------------------  Cardiac Enzymes Recent Labs  Lab 06/20/18 1554  TROPONINI <0.03   ------------------------------------------------------------------------------------------------------------------  RADIOLOGY:  US Pelvic Complete With Transvaginal  Result Date: 06/22/2018 CLINICAL DATA:  Vaginal bleeding EXAM: TRANSABDOMINAL AND TRANSVAGINAL ULTRASOUND OF PELVIS TECHNIQUE: Both transabdominal and transvaginal ultrasound examinations of the pelvis were performed. Transabdominal technique was performed for global imaging of the pelvis including uterus, ovaries, adnexal regions, and pelvic cul-de-sac. It was necessary to proceed with endovaginal exam following the transabdominal exam to visualize the endometrium and bilateral ovaries. COMPARISON:  None FINDINGS: Uterus Measurements: 9.5 x 4.6 x 5.1 cm = volume: 115.8 mL. No fibroids or other mass visualized. Endometrium Thickness: 11.5 mm, abnormally thickened for patient age. The endometrium is coarse with mild heterogeneous echotexture. Right ovary Not visualized. Left ovary Not visualized. Other findings  No abnormal free fluid. IMPRESSION: Abnormal thickened endometrium for patient's age. The uterus is normal. Bilateral ovaries are not visualized. Electronically Signed   By: Mallie Darting.D.  On: 06/22/2018 14:42   ASSESSMENT AND PLAN:   63 year old female patient with a known history of atrial fibrillation, coronary artery disease, congestive heart failure, hypertension presented to the emergency room for shortness of breath and cough.    -Right middle lobe community-acquired pneumonia IV Rocephin and Zithromax.  Currently improving, continue bronchodilators, IV steroids, no leukocytosis. No diagnosis of COPD but with her smoking history likely has underlying COPD. Will need pulmonary follow-up after discharge for lung function study  -Atrial fibrillation rapid ventricular rate secondary to respiratory issues Continue anticoagulation with oral Eliquis Continue diltiazem and metoprolol for rate control. Had history of cardioversion before, but persistently tachycardic with heart rate 125 bpm, patient already on metoprolol 100 mg p.o. twice daily, Cardizem 60 mg every 6 hours, consult cardiology due to her history of chronic A. fib, history of cardioversions before, patient follows [facility] with [facility] cardi[facility]  -Chronic diastolic heart failure Stable Continue Lasix for diuresis Continue oral Aldactone  -Coronary artery disease Metoprolol, Lipitor, ASA  -Vaginal bleeding.  Follows with obstetrics.  Was supposed to have ultrasound of the pelvis.  Ordered.  Needs outpatient follow-up. Stable hemoglobin  All the records are reviewed and case discussed with Care Management/Social Worker Management plans discussed with the patient, family and they are in agreement.  CODE STATUS: FULL CODE  DVT Prophylaxis: SCDs  TOTAL TIME TAKING CARE OF THIS PATIENT: 35 minutes.   POSSIBLE D/C IN 1-2 DAYS, DEPENDING ON CLINICAL CONDITION.  Epifanio Lesches M.D on 06/23/2018 at 7:38  AM  Between 7am to 6pm - Pager - [phone]  After 6pm go to [email or web address] - password [email or web address]  Office  [phone]  CC: Primary care physician; Mardi Mainland, FNP  Note: This dictation was prepared with Dragon dictation along with smaller phrase technology. Any transcriptional errors that result from this process are unintentional.

## 2018-06-23 NOTE — Consult Note (Signed)
Cardiology Consultation:   Patient ID: Rhonda [name] MRN: [id number]; DOB: [birth year]  Admit date: 06/20/2018 Date of Consult: 06/23/2018  Primary Care Provider: Mardi Mainland, [place] Primary Cardiologist:  Schuyler Amor Primary Electrophysiologist:  None    Patient Profile:   Rhonda [name] is a 63 y.o. female with a hx of atrial fibrillation, coronary artery disease, CHF, hypertension who is being seen today for the evaluation of atrial fibrillation at the request of Dr. Vianne Bulls.  History of Present Illness:   Rhonda [name] 63 year old female with a history of atrial fibrillation, coronary artery disease who presented to the emergency room 2 days ago with shortness of breath and cough.  She was found to have a pneumonia and was started on IV antibiotics with Rocephin and Zithromax.  Currently she is feeling well.  She is almost back to normal as far as her respiratory status.  She is currently unaware of her atrial fibrillation.  Past Medical History:  Diagnosis Date  . Atrial fibrillation ([place])   . CAD (coronary artery disease) 08/2009   BMS to RCA, non-obstructive in CX and LAD 2011  . CHF (congestive heart failure) ([place])   . Hypertension     Past Surgical History:  Procedure Laterality Date  . CESAREAN SECTION     x 1. no BTL  . CORONARY ANGIOPLASTY WITH STENT PLACEMENT    . DILATION AND CURETTAGE, DIAGNOSTIC / THERAPEUTIC     for SAB     Home Medications:  Prior to Admission medications   Medication Sig Start Date End Date Taking? Authorizing Provider  apixaban (ELIQUIS) 5 MG TABS tablet Take 5 mg by mouth 2 (two) times daily.   Yes [provider]  atorvastatin (LIPITOR) 20 MG tablet Take 1 tablet (20 mg total) by mouth daily. 02/08/16  Yes Donne Hazel, MD  diltiazem (CARDIZEM) 60 MG tablet Take 1 tablet (60 mg total) by mouth every 8 (eight) hours. 02/08/16  Yes Donne Hazel, MD  furosemide (LASIX) 40 MG tablet Take 1 tablet (40 mg  total) by mouth daily. 02/08/16  Yes Donne Hazel, MD  metoprolol (LOPRESSOR) 100 MG tablet Take 1 tablet (100 mg total) by mouth every 12 (twelve) hours. 02/08/16  Yes Donne Hazel, MD  metroNIDAZOLE (FLAGYL) 500 MG tablet Take two tablets by mouth twice a day, for one day.  Or you can take all four tablets at once if you can tolerate it. 06/05/18  Yes Aletha Halim, MD  spironolactone (ALDACTONE) 25 MG tablet Take 0.5 tablets (12.5 mg total) by mouth daily. 02/08/16  Yes Donne Hazel, MD    Inpatient Medications: Scheduled Meds: . apixaban  5 mg Oral BID  . atorvastatin  20 mg Oral QHS  . diltiazem  60 mg Oral Q8H  . furosemide  40 mg Oral Daily  . guaiFENesin  600 mg Oral BID  . ipratropium-albuterol  3 mL Nebulization TID  . methylPREDNISolone (SOLU-MEDROL) injection  60 mg Intravenous BID  . metoprolol tartrate  100 mg Oral Q12H  . ENSURE MAX PROTEIN  11 oz Oral BID  . sodium chloride flush  3 mL Intravenous Q12H  . spironolactone  12.5 mg Oral Daily   Continuous Infusions: . sodium chloride Stopped (06/22/18 1846)  . cefTRIAXone (ROCEPHIN)  IV Stopped (06/22/18 1844)   PRN Meds: sodium chloride, acetaminophen **OR** acetaminophen, ondansetron **OR** ondansetron (ZOFRAN) IV, senna-docusate, sodium chloride flush  Allergies:    Allergies  Allergen Reactions  . Ace Inhibitors  Cough    Social History:   Social History   Socioeconomic History  . Marital status: Divorced    Spouse name: Not on file  . Number of children: Not on file  . Years of education: Not on file  . Highest education level: Not on file  Occupational History  . Not on file  Social Needs  . Financial resource strain: Not on file  . Food insecurity:    Worry: Not on file    Inability: Not on file  . Transportation needs:    Medical: Not on file    Non-medical: Not on file  Tobacco Use  . Smoking status: Current Every Day Smoker    Packs/day: 0.20    Types: Cigarettes  . Smokeless  tobacco: Never Used  Substance and Sexual Activity  . Alcohol use: Yes    Alcohol/week: 2.0 standard drinks    Types: 2 Cans of beer per week    Comment: Former heavy drinker, with whiskey. Was in [place] but is not drinking beer again.,   . Drug use: Yes    Types: Marijuana  . Sexual activity: Yes    Birth control/protection: Post-menopausal  Lifestyle  . Physical activity:    Days per week: Not on file    Minutes per session: Not on file  . Stress: Not on file  Relationships  . Social connections:    Talks on phone: Not on file    Gets together: Not on file    Attends religious service: Not on file    Active member of club or organization: Not on file    Attends meetings of clubs or organizations: Not on file    Relationship status: Not on file  . Intimate partner violence:    Fear of current or ex partner: Not on file    Emotionally abused: Not on file    Physically abused: Not on file    Forced sexual activity: Not on file  Other Topics Concern  . Not on file  Social History Narrative  . Not on file    Family History:    Family History  Problem Relation Age of Onset  . CAD Mother   . Diabetes Mellitus II Father   . Hypertension Sister   . CAD Maternal Grandmother      ROS:  Please see the history of present illness.   All other ROS reviewed and negative.     Physical Exam/Data:   Vitals:   06/22/18 2016 06/22/18 2141 06/23/18 0429 06/23/18 0729  BP: 101/86  (!) 129/92 115/83  Pulse: (!) 47 (!) 141 (!) 54 (!) 125  Resp: 20   18  Temp: 98.5 F (36.9 C)  97.7 F (36.5 C) 97.6 F (36.4 C)  TempSrc: Oral  Oral Oral  SpO2: 93%  95% 94%  Weight:   95 kg   Height:        Intake/Output Summary (Last 24 hours) at 06/23/2018 1128 Last data filed at 06/23/2018 0957 Gross per 24 hour  Intake 560.35 ml  Output -  Net 560.35 ml   Last 3 Weights 06/23/2018 06/20/2018 06/20/2018  Weight (lbs) 209 lb 6.4 oz 202 lb 9.6 oz 205 lb  Weight (kg) 94.983 kg 91.899 kg 92.987  kg     Body mass index is 37.09 kg/m.  General:  Well nourished, well developed, in no acute distress HEENT: normal Lymph: no adenopathy Neck: no JVD Endocrine:  No thryomegaly Vascular: No carotid bruits; FA pulses 2+  bilaterally without bruits  Cardiac: Tachycardic, irregular; no murmur  Lungs:  clear to auscultation bilaterally, no wheezing, rhonchi or rales  Abd: soft, nontender, no hepatomegaly  Ext: no edema Musculoskeletal:  No deformities, BUE and BLE strength normal and equal Skin: warm and dry  Neuro:  CNs 2-12 intact, no focal abnormalities noted Psych:  Normal affect   EKG:  The EKG was personally reviewed and demonstrates:  AF, rate 110 Telemetry:  Telemetry was personally reviewed and demonstrates:  AF with RVR  Relevant CV Studies: TTE 10/19 - Left ventricle: The cavity size was normal. Wall thickness was   normal. Systolic function was normal. The estimated ejection   fraction was in the range of 50% to 55%. Wall motion was normal;   there were no regional wall motion abnormalities. - Mitral valve: There was mild regurgitation. - Left atrium: The atrium was severely dilated. - Right atrium: The atrium was moderately dilated.  Laboratory Data:  Chemistry Recent Labs  Lab 06/20/18 1554 06/21/18 0546  NA 135 135  K 3.2* 3.7  CL 97* 98  CO2 27 27  GLUCOSE 123* 173*  BUN 10 15  CREATININE 0.81 0.73  CALCIUM 8.8* 8.7*  GFRNONAA >60 >60  GFRAA >60 >60  ANIONGAP 11 10    No results for input(s): PROT, ALBUMIN, AST, ALT, ALKPHOS, BILITOT in the last 168 hours. Hematology Recent Labs  Lab 06/20/18 1554 06/21/18 0546  WBC 10.4 6.9  RBC 4.68 4.35  HGB 15.4* 14.2  HCT 45.8 42.4  MCV 97.9 97.5  MCH 32.9 32.6  MCHC 33.6 33.5  RDW 13.8 13.8  PLT 189 176   Cardiac Enzymes Recent Labs  Lab 06/20/18 1554  TROPONINI <0.03   No results for input(s): TROPIPOC in the last 168 hours.  BNP Recent Labs  Lab 06/20/18 1554  BNP 458.0*    DDimer No  results for input(s): DDIMER in the last 168 hours.  Radiology/Studies:  Dg Chest 2 View  Result Date: 06/20/2018 CLINICAL DATA:  63 year old female with productive cough, worsening shortness of breath and fever. EXAM: CHEST - 2 VIEW COMPARISON:  None. FINDINGS: Mild nonspecific patchy airspace opacity in the right middle lobe partially obscuring the right cardiac margin on the frontal view. The findings are not well seen on the lateral view but are concerning for right middle lobe bronchopneumonia. Mild cardiomegaly. Atherosclerotic calcifications present in the transverse aorta. Minimal vascular congestion without overt edema. The lungs are slightly hyperinflated with diffuse mild bronchitic changes. No evidence of acute osseous abnormality. IMPRESSION: 1. Patchy right middle lobe airspace opacity concerning for bronchopneumonia given the clinical history. Followup PA and lateral chest X-ray is recommended in 3-4 weeks following trial of antibiotic therapy to ensure resolution and exclude underlying malignancy. 2. Cardiomegaly and mild vascular congestion without overt edema. 3. Pulmonary hyperinflation and diffuse mild bronchitic changes suggests underlying COPD. 4.  Aortic Atherosclerosis (ICD10-170.0) Electronically Signed   By: Jacqulynn Cadet M.D.   On: 06/20/2018 16:34   US Pelvic Complete With Transvaginal  Result Date: 06/22/2018 CLINICAL DATA:  Vaginal bleeding EXAM: TRANSABDOMINAL AND TRANSVAGINAL ULTRASOUND OF PELVIS TECHNIQUE: Both transabdominal and transvaginal ultrasound examinations of the pelvis were performed. Transabdominal technique was performed for global imaging of the pelvis including uterus, ovaries, adnexal regions, and pelvic cul-de-sac. It was necessary to proceed with endovaginal exam following the transabdominal exam to visualize the endometrium and bilateral ovaries. COMPARISON:  None FINDINGS: Uterus Measurements: 9.5 x 4.6 x 5.1 cm = volume: 115.8 mL.  No fibroids or  other mass visualized. Endometrium Thickness: 11.5 mm, abnormally thickened for patient age. The endometrium is coarse with mild heterogeneous echotexture. Right ovary Not visualized. Left ovary Not visualized. Other findings No abnormal free fluid. IMPRESSION: Abnormal thickened endometrium for patient's age. The uterus is normal. Bilateral ovaries are not visualized. Electronically Signed   By: Abelardo Diesel M.D.   On: 06/22/2018 14:42    Assessment and Plan:   1. Permanent atrial fibrillation: Currently on diltiazem 60 mg 3 times a day and metoprolol 100 mg twice a day.  He is anticoagulated with Eliquis.  At this point, would continue her anticoagulation.  Her heart rate is certainly elevated at times, but I feel that this is likely being driven by her pneumonia.  I  adjust her diltiazem to be dosed once a day and increase to 360 mg.  She  likely need a follow-up appointment with her cardiologist to further discuss her rate control.  I do feel that if her heart rate is in the low 100s, she would be stable for discharge. 2. Diastolic heart failure: No obvious signs of volume overload.  Continue with Lasix.      For questions or updates, please contact [place] Please consult [email or web address] for contact info under     Signed,  Meredith Leeds, MD  06/23/2018 11:28 AM

## 2018-06-23 NOTE — Plan of Care (Signed)
Patient was disappointed that she will not go home today, but she understands why.  Patient is positive and receptive.

## 2018-06-24 MED ORDER — AMOXICILLIN-POT CLAVULANATE 875-125 MG PO TABS: 1.0000 | ORAL_TABLET | Freq: Two times a day (BID) | ORAL | 0 refills | Status: AC

## 2018-06-24 MED ORDER — DILTIAZEM HCL ER COATED BEADS 360 MG PO CP24: 360.0000 mg | ORAL_CAPSULE | Freq: Every day | ORAL | 0 refills | Status: DC

## 2018-06-24 MED ORDER — ALBUTEROL SULFATE HFA 108 (90 BASE) MCG/ACT IN AERS: 2.0000 | INHALATION_SPRAY | Freq: Four times a day (QID) | RESPIRATORY_TRACT | 2 refills | Status: AC | PRN

## 2018-06-24 MED ORDER — PREDNISONE 10 MG (21) PO TBPK: ORAL_TABLET | ORAL | 0 refills | Status: DC

## 2018-06-24 NOTE — Progress Notes (Signed)
RN removed patient's IV and gave the heart monitor to the secretary.  Patient will leave with her family in a private vehicle.  Phillis Knack, RN

## 2018-06-24 NOTE — Progress Notes (Signed)
Patient reported MD said she could go home.  RN text-paged the MD and she confirmed it and asked RN to put in a discharge order.  Phillis Knack, RN

## 2018-06-24 NOTE — Progress Notes (Signed)
Patient is stable for discharge, she feels much better, heart rate also is well controlled after adjusting Cardizem by cardiology.  Discharge instructions are in the computer, patient can take just 50 mg p.o. daily and follow-up with Cardizem 360 mg p.o. daily, follow-up with primary cardiologist in 1 week.  Can follow-up with PCP in 1 week.  Prescriptions are given for Augmentin for  1 week, prednisone dose taper.

## 2018-06-25 ENCOUNTER — Encounter: Payer: Self-pay | Admitting: Obstetrics and Gynecology

## 2018-06-25 NOTE — Discharge Summary (Signed)
Rhonda [name], is a 63 y.o. female  DOB 1955-12-06  MRN [id number].  Admission date:  06/20/2018  Admitting Physician  Saundra Shelling, MD  Discharge Date:  06/24/2018   Primary MD  Mardi Mainland, FNP  Recommendations for primary care physician for things to follow:   Follow-up with PCP in 1 week Follow-up with primary cardiologist in 1 to 2 weeks.   Admission Diagnosis  Acute respiratory failure with hypoxia (HCC) [J96.01] Atrial fibrillation with RVR (HCC) [I48.91] Community acquired pneumonia, unspecified laterality [J18.9] Acute on chronic congestive heart failure, unspecified heart failure type ([place]) [I50.9]   Discharge Diagnosis  Acute respiratory failure with hypoxia (HCC) [J96.01] Atrial fibrillation with RVR ([place]) [I48.91] Community acquired pneumonia, unspecified laterality [J18.9] Acute on chronic congestive heart failure, unspecified heart failure type ([place]) [I50.9]    Active Problems:   Pneumonia      Past Medical History:  Diagnosis Date  . Atrial fibrillation ([place])   . CAD (coronary artery disease) 08/2009   BMS to RCA, non-obstructive in CX and LAD 2011  . CHF (congestive heart failure) ([place])   . Hypertension     Past Surgical History:  Procedure Laterality Date  . CESAREAN SECTION     x 1. no BTL  . CORONARY ANGIOPLASTY WITH STENT PLACEMENT    . DILATION AND CURETTAGE, DIAGNOSTIC / THERAPEUTIC     for SAB       History of present illness and  Hospital Course:     Kindly see H&P for history of present illness and admission details, please review complete Labs, Consult reports and Test reports for all details in brief  HPI  from the history and physical done on the day of admission 63 year old female patient mated for shortness of breath, cough found to have COPD  exacerbation, pneumonia.  Waited for the same.   Hospital Course  Right middle lobe pneumonia: Received IV Rocephin, Zithromax, discharged home with Augmentin for 1 week, patient did not have any leukocytosis patient wheezing improved.  Patient can follow with PCP.  Discharged home with steroid Dosepak, can continue her albuterol inhalers. 2.  Atrial fibrillation with RVR, better controlled after Cardizem dose has been adjusted to Cardizem CD 360 mg daily, continue Eliquis.  Continue metoprolol. 3 vaginal bleeding, patient supposed to follow-up with GYN as an outpatient. 4.  Chronic diastolic heart failure, stable.    Discharge Condition: Stable   Follow UP  Follow-up Information    Mardi Mainland, FNP. Schedule an appointment as soon as possible for a visit.   Specialty:  Nurse Practitioner Why:  Patient can call PCP tomorrow and make appointment. Contact information: [place] [address] [phone]        Elouise Munroe, MD Follow up.   Specialty:  Cardiology Why:  Patient can call primary cardiologist  tomorrow and make appointment in 1 week Contact information: [address] [place] [address] [phone]             Discharge Instructions  and  Discharge Medications     Allergies as of 06/24/2018      Reactions   Ace Inhibitors Cough      Medication List    STOP taking these medications   diltiazem 60 MG tablet Commonly known as:  CARDIZEM     TAKE these medications   albuterol 108 (90 Base) MCG/ACT inhaler Commonly known as:  PROVENTIL HFA;VENTOLIN HFA Inhale 2 puffs into the lungs  every 6 (six) hours as needed for wheezing or shortness of breath.   amoxicillin-clavulanate 875-125 MG tablet Commonly known as:  AUGMENTIN Take 1 tablet by mouth 2 (two) times daily for 7 days.   atorvastatin 20 MG tablet Commonly known as:  LIPITOR Take 1 tablet (20 mg total) by mouth daily.   diltiazem 360 MG 24  hr capsule Commonly known as:  CARDIZEM CD Take 1 capsule (360 mg total) by mouth daily.   ELIQUIS 5 MG Tabs tablet Generic drug:  apixaban Take 5 mg by mouth 2 (two) times daily.   furosemide 40 MG tablet Commonly known as:  LASIX Take 1 tablet (40 mg total) by mouth daily.   metoprolol tartrate 100 MG tablet Commonly known as:  LOPRESSOR Take 1 tablet (100 mg total) by mouth every 12 (twelve) hours.   metroNIDAZOLE 500 MG tablet Commonly known as:  FLAGYL Take two tablets by mouth twice a day, for one day.  Or you can take all four tablets at once if you can tolerate it.   predniSONE 10 MG (21) Tbpk tablet Commonly known as:  STERAPRED UNI-PAK 21 TAB Taper by 10 mg p.o. daily   spironolactone 25 MG tablet Commonly known as:  ALDACTONE Take 0.5 tablets (12.5 mg total) by mouth daily.         Diet and Activity recommendation: See Discharge Instructions above   Consults obtained -cardiology   Major procedures and Radiology Reports - PLEASE review detailed and final reports for all details, in brief -     Dg Chest 2 View  Result Date: 06/20/2018 CLINICAL DATA:  63 year old female with productive cough, worsening shortness of breath and fever. EXAM: CHEST - 2 VIEW COMPARISON:  None. FINDINGS: Mild nonspecific patchy airspace opacity in the right middle lobe partially obscuring the right cardiac margin on the frontal view. The findings are not well seen on the lateral view but are concerning for right middle lobe bronchopneumonia. Mild cardiomegaly. Atherosclerotic calcifications present in the transverse aorta. Minimal vascular congestion without overt edema. The lungs are slightly hyperinflated with diffuse mild bronchitic changes. No evidence of acute osseous abnormality. IMPRESSION: 1. Patchy right middle lobe airspace opacity concerning for bronchopneumonia given the clinical history. Followup PA and lateral chest X-ray is recommended in 3-4 weeks following trial of  antibiotic therapy to ensure resolution and exclude underlying malignancy. 2. Cardiomegaly and mild vascular congestion without overt edema. 3. Pulmonary hyperinflation and diffuse mild bronchitic changes suggests underlying COPD. 4.  Aortic Atherosclerosis (ICD10-170.0) Electronically Signed   By: Jacqulynn Cadet M.D.   On: 06/20/2018 16:34   US Pelvic Complete With Transvaginal  Result Date: 06/22/2018 CLINICAL DATA:  Vaginal bleeding EXAM: TRANSABDOMINAL AND TRANSVAGINAL ULTRASOUND OF PELVIS TECHNIQUE: Both transabdominal and transvaginal ultrasound examinations of the pelvis were performed. Transabdominal technique was performed for global imaging of the pelvis including uterus, ovaries, adnexal regions, and pelvic cul-de-sac. It was necessary to proceed with endovaginal exam following the transabdominal exam to visualize the endometrium and bilateral ovaries. COMPARISON:  None FINDINGS: Uterus Measurements: 9.5 x 4.6 x 5.1 cm = volume: 115.8 mL. No fibroids or other mass visualized. Endometrium Thickness: 11.5 mm, abnormally thickened for patient age. The endometrium is coarse with mild heterogeneous echotexture. Right ovary Not visualized. Left ovary Not visualized. Other findings No abnormal free fluid. IMPRESSION: Abnormal thickened endometrium for patient's age. The uterus is normal. Bilateral ovaries are not visualized. Electronically Signed   By: Abelardo Diesel M.D.   On: 06/22/2018 14:42  Micro Results     No results found for this or any previous visit (from the past 240 hour(s)).     Today   Subjective:   Rhonda [name] today has no headache,no chest abdominal pain,no new weakness tingling or numbness, feels much better wants to go home today.   Objective:   Blood pressure 115/75, pulse 65, temperature 98.1 F (36.7 C), temperature source Oral, resp. rate 19, height 5\' 3"  (1.6 m), weight 93.3 kg, SpO2 93 %.   Intake/Output Summary (Last 24 hours) at 06/25/2018  1139 Last data filed at 06/24/2018 1353 Gross per 24 hour  Intake 360 ml  Output -  Net 360 ml    Exam Awake Alert, Oriented x 3, No new F.N deficits, Normal affect [place].AT,PERRAL Supple Neck,No JVD, No cervical lymphadenopathy appriciated.  Symmetrical Chest wall movement, Good air movement bilaterally, CTAB RRR,No Gallops,Rubs or new Murmurs, No Parasternal Heave +ve B.Sounds, Abd Soft, Non tender, No organomegaly appriciated, No rebound -guarding or rigidity. No Cyanosis, Clubbing or edema, No new Rash or bruise  Data Review   CBC w Diff:  Lab Results  Component Value Date   WBC 6.9 06/21/2018   HGB 14.2 06/21/2018   HGB 15.1 03/29/2018   HCT 42.4 06/21/2018   HCT 43.9 03/29/2018   PLT 176 06/21/2018   PLT 222 03/29/2018   LYMPHOPCT 15 06/20/2018   MONOPCT 8 06/20/2018   EOSPCT 0 06/20/2018   BASOPCT 0 06/20/2018    CMP:  Lab Results  Component Value Date   NA 135 06/21/2018   NA 137 03/29/2018   K 3.7 06/21/2018   CL 98 06/21/2018   CO2 27 06/21/2018   BUN 15 06/21/2018   BUN 20 03/29/2018   CREATININE 0.73 06/21/2018   PROT 6.5 02/03/2016   ALBUMIN 3.6 02/03/2016   BILITOT 0.5 02/03/2016   ALKPHOS 61 02/03/2016   AST 16 02/03/2016   ALT 19 02/03/2016  .   Total Time in preparing paper work, data evaluation and todays exam - 35 minutes  Epifanio Lesches M.D on 06/24/2018 at 11:39 AM    Note: This dictation was prepared with Dragon dictation along with smaller phrase technology. Any transcriptional errors that result from this process are unintentional.

## 2018-06-25 NOTE — Progress Notes (Unsigned)
It looks like she just got out of the hospital and they did her u/s while she was inpatient. Can you set her up with an appointment with me in mid February? Thanks  Durene Romans MD [facility]ending [facility] (Faculty Practice) 06/25/2018 Time: [phone]

## 2018-06-27 ENCOUNTER — Telehealth: Payer: Self-pay | Admitting: *Deleted

## 2018-06-27 ENCOUNTER — Other Ambulatory Visit: Payer: Self-pay | Admitting: Obstetrics and Gynecology

## 2018-06-27 MED ORDER — NORETHINDRONE ACETATE 5 MG PO TABS: ORAL_TABLET | ORAL | 1 refills | Status: DC

## 2018-06-27 NOTE — Telephone Encounter (Signed)
-----   Message from Aletha Halim, MD sent at 06/27/2018  8:22 AM EST ----- Regarding: RE: concerns from patient Can you let her know to try aygestin 10mg  PO q6h x 3 days and then q8h x 3 days and then q12h thereafter. If the bleeding doesn't improve after a day or two or if she's soaking through a pad an hour for 3 hours, she needs to go to MAU.  thanks ----- Message ----- From: Allena Earing, NT Sent: 06/26/2018   8:45 AM EST To: Aletha Halim, MD Subject: concerns from patient                          Patient is scheduled for 07/19/18 @ 1:15 to come see you at [facility] however, pt concerned about waiting that long bc bleeding is heavier than lighter, patient going through about 6 pads a day. Please advise, ok to wait that long?

## 2018-06-28 ENCOUNTER — Telehealth: Payer: Self-pay | Admitting: *Deleted

## 2018-06-28 NOTE — Telephone Encounter (Signed)
Called patient again to follow up on message regarding her bleeding. Pt states her bleeding is better and the pharmacy told her her medication will be in today and she will start that. Informed patient that is her bleeding does get worse again and if she is soaking a pad every hour for 3 hours to go to MAU. Patient verbalizes and understands.   Crosby Oyster, RN

## 2018-06-28 NOTE — Telephone Encounter (Signed)
-----   Message from Aletha Halim, MD sent at 06/27/2018  8:22 AM EST ----- Regarding: RE: concerns from patient Can you let her know to try aygestin 10mg  PO q6h x 3 days and then q8h x 3 days and then q12h thereafter. If the bleeding doesn't improve after a day or two or if she's soaking through a pad an hour for 3 hours, she needs to go to MAU.  thanks ----- Message ----- From: Allena Earing, NT Sent: 06/26/2018   8:45 AM EST To: Aletha Halim, MD Subject: concerns from patient                          Patient is scheduled for 07/19/18 @ 1:15 to come see you at [facility] however, pt concerned about waiting that long bc bleeding is heavier than lighter, patient going through about 6 pads a day. Please advise, ok to wait that long?

## 2018-07-03 ENCOUNTER — Other Ambulatory Visit (HOSPITAL_BASED_OUTPATIENT_CLINIC_OR_DEPARTMENT_OTHER): Payer: Self-pay

## 2018-07-03 ENCOUNTER — Ambulatory Visit: Payer: Medicare HMO | Admitting: Internal Medicine

## 2018-07-03 ENCOUNTER — Telehealth: Payer: Self-pay | Admitting: *Deleted

## 2018-07-03 ENCOUNTER — Encounter: Payer: Self-pay | Admitting: Internal Medicine

## 2018-07-03 VITALS — BP 98/70 | HR 82 | Ht 63.0 in | Wt 218.8 lb

## 2018-07-03 DIAGNOSIS — R0683 Snoring: Secondary | ICD-10-CM | POA: Diagnosis not present

## 2018-07-03 DIAGNOSIS — I480 Paroxysmal atrial fibrillation: Secondary | ICD-10-CM

## 2018-07-03 DIAGNOSIS — Z9861 Coronary angioplasty status: Secondary | ICD-10-CM

## 2018-07-03 DIAGNOSIS — I2510 Atherosclerotic heart disease of native coronary artery without angina pectoris: Secondary | ICD-10-CM

## 2018-07-03 DIAGNOSIS — I10 Essential (primary) hypertension: Secondary | ICD-10-CM | POA: Diagnosis not present

## 2018-07-03 DIAGNOSIS — I5033 Acute on chronic diastolic (congestive) heart failure: Secondary | ICD-10-CM | POA: Diagnosis not present

## 2018-07-03 DIAGNOSIS — I4821 Permanent atrial fibrillation: Secondary | ICD-10-CM

## 2018-07-03 LAB — BASIC METABOLIC PANEL
BUN/Creatinine Ratio: 17 (ref 12–28)
BUN: 14 mg/dL (ref 8–27)
CHLORIDE: 97 mmol/L (ref 96–106)
CO2: 28 mmol/L (ref 20–29)
Calcium: 8.9 mg/dL (ref 8.7–10.3)
Creatinine, Ser: 0.83 mg/dL (ref 0.57–1.00)
GFR calc Af Amer: 89 mL/min/{1.73_m2} (ref >59)
GFR calc non Af Amer: 77 mL/min/{1.73_m2} (ref >59)
Glucose: 100 mg/dL — ABNORMAL HIGH (ref 65–99)
Potassium: 4.4 mmol/L (ref 3.5–5.2)
Sodium: 138 mmol/L (ref 134–144)

## 2018-07-03 MED ORDER — FUROSEMIDE 40 MG PO TABS: 40.0000 mg | ORAL_TABLET | Freq: Two times a day (BID) | ORAL | 0 refills | Status: DC

## 2018-07-03 MED ORDER — POTASSIUM CHLORIDE CRYS ER 20 MEQ PO TBCR: 20.0000 meq | EXTENDED_RELEASE_TABLET | Freq: Every day | ORAL | 5 refills | Status: DC

## 2018-07-03 MED ORDER — POTASSIUM CHLORIDE CRYS ER 20 MEQ PO TBCR: 20.0000 meq | EXTENDED_RELEASE_TABLET | Freq: Every day | ORAL | 0 refills | Status: DC

## 2018-07-03 NOTE — Telephone Encounter (Signed)
Patient notified of HST scheduled for 07/23/18 @ 9:00 am.

## 2018-07-03 NOTE — Telephone Encounter (Signed)
-----   Message from Lamar Laundry, RN sent at 07/03/2018 10:19 AM EST ----- Regarding: pt would like to reschedule her at home sleep study Geronimo Boot,  Dr.Acharya saw this pt this morning and she would like to reschedule her at home sleep study. Could you please arrange and call her.   Thanks, Lattie Haw

## 2018-07-03 NOTE — Patient Instructions (Signed)
Medication Instructions:  Your physician has recommended you make the following change in your medication:  1) INCREASE Lasix to 40mg  twice daily for 2 weeks than resume 40mg  once daily. 2) START Potassium 58mEq daily for 2 weeks.   If you need a refill on your cardiac medications before your next appointment, please call your pharmacy.   Lab work: Teacher, English as a foreign language recommends that you return for lab work in: 2 weeks (Bmet)  If you have labs (blood work) drawn today and your tests are completely normal, you will receive your results only by: Marland Kitchen MyChart Message (if you have MyChart) OR . A paper copy in the mail If you have any lab test that is abnormal or we need to change your treatment, we will call you to review the results.  Testing/Procedures: None ordered  Follow-Up: At [facility], you and your health needs are our priority.  As part of our continuing mission to provide you with exceptional heart care, we have created designated Provider Care Teams.  These Care Teams include your primary Cardiologist (physician) and Advanced Practice Providers (APPs -  Physician Assistants and Nurse Practitioners) who all work together to provide you with the care you need, when you need it. You will need a follow up appointment in 3 months.    You may see No primary care provider on file. Dr.Acharya or one of the following Advanced Practice Providers on your designated Care Team:   Rosaria Ferries, PA-C . Jory Sims, DNP, ANP  Any Other Special Instructions Will Be Listed Below (If Applicable). Your physician recommends that you weigh, daily, at the same time every day, and in the same amount of clothing. Please record your daily weights on the handout provided and bring it to your next appointment.  We will forward a message to our sleep coordinator Mariann Laster to contact you to reschedule your at home sleep study       [place] refers to food  and lifestyle choices that are based on the traditions of countries located on the [employer]. This way of eating has been shown to help prevent certain conditions and improve outcomes for people who have chronic diseases, like kidney disease and heart disease. What are tips for following this plan? Lifestyle  Cook and eat meals together with your family, when possible.  Drink enough fluid to keep your urine clear or pale yellow.  Be physically active every day. This includes: ? Aerobic exercise like running or swimming. ? Leisure activities like gardening, walking, or housework.  Get 7-8 hours of sleep each night.  If recommended by your health care provider, drink red wine in moderation. This means 1 glass a day for nonpregnant women and 2 glasses a day for men. A glass of wine equals 5 oz (150 mL). Reading food labels   Check the serving size of packaged foods. For foods such as rice and pasta, the serving size refers to the amount of cooked product, not dry.  Check the total fat in packaged foods. Avoid foods that have saturated fat or trans fats.  Check the ingredients list for added sugars, such as corn syrup. Shopping  At the grocery store, buy most of your food from the areas near the walls of the store. This includes: ? Fresh fruits and vegetables (produce). ? Grains, beans, nuts, and seeds. Some of these may be available in unpackaged forms or large amounts (in bulk). ? Fresh seafood. ? Poultry and  eggs. ? Low-fat dairy products.  Buy whole ingredients instead of prepackaged foods.  Buy fresh fruits and vegetables in-season from local farmers markets.  Buy frozen fruits and vegetables in resealable bags.  If you do not have access to quality fresh seafood, buy precooked frozen shrimp or canned fish, such as tuna, salmon, or sardines.  Buy small amounts of raw or cooked vegetables, salads, or olives from the deli or salad bar at your store.  Stock your pantry  so you always have certain foods on hand, such as olive oil, canned tuna, canned tomatoes, rice, pasta, and beans. Cooking  Cook foods with extra-virgin olive oil instead of using butter or other vegetable oils.  Have meat as a side dish, and have vegetables or grains as your main dish. This means having meat in small portions or adding small amounts of meat to foods like pasta or stew.  Use beans or vegetables instead of meat in common dishes like chili or lasagna.  Experiment with different cooking methods. Try roasting or broiling vegetables instead of steaming or sauteing them.  Add frozen vegetables to soups, stews, pasta, or rice.  Add nuts or seeds for added healthy fat at each meal. You can add these to yogurt, salads, or vegetable dishes.  Marinate fish or vegetables using olive oil, lemon juice, garlic, and fresh herbs. Meal planning   Plan to eat 1 vegetarian meal one day each week. Try to work up to 2 vegetarian meals, if possible.  Eat seafood 2 or more times a week.  Have healthy snacks readily available, such as: ? Vegetable sticks with hummus. ? [place] yogurt. ? Fruit and nut trail mix.  Eat balanced meals throughout the week. This includes: ? Fruit: 2-3 servings a day ? Vegetables: 4-5 servings a day ? Low-fat dairy: 2 servings a day ? Fish, poultry, or lean meat: 1 serving a day ? Beans and legumes: 2 or more servings a week ? Nuts and seeds: 1-2 servings a day ? Whole grains: 6-8 servings a day ? Extra-virgin olive oil: 3-4 servings a day  Limit red meat and sweets to only a few servings a month What are my food choices?  Mediterranean diet ? Recommended ? Grains: Whole-grain pasta. Brown rice. Bulgar wheat. Polenta. Couscous. Whole-wheat bread. [place]. ? Vegetables: Artichokes. Beets. Broccoli. Cabbage. Carrots. Eggplant. Green beans. Chard. Kale. Spinach. Onions. Leeks. Peas. Squash. Tomatoes. Peppers. Radishes. ? Fruits: Apples. Apricots.  Avocado. Berries. Bananas. Cherries. Dates. Figs. Grapes. Lemons. Melon. Oranges. Peaches. Plums. Pomegranate. ? Meats and other protein foods: Beans. Almonds. Sunflower seeds. Pine nuts. Peanuts. [place]. Salmon. Scallops. Shrimp. [place]. Tilapia. Clams. Oysters. Eggs. ? Dairy: Low-fat milk. Cheese. Greek yogurt. ? Beverages: Water. Red wine. Herbal tea. ? Fats and oils: Extra virgin olive oil. Avocado oil. Grape seed oil. ? Sweets and desserts: [place] yogurt with honey. Baked apples. Poached pears. Trail mix. ? Seasoning and other foods: Basil. Cilantro. Coriander. Cumin. Mint. Parsley. Sage. Rosemary. Tarragon. Garlic. Oregano. Thyme. Pepper. Balsalmic vinegar. Tahini. Hummus. Tomato sauce. Olives. Mushrooms. ? Limit these ? Grains: Prepackaged pasta or rice dishes. Prepackaged cereal with added sugar. ? Vegetables: Deep fried potatoes (french fries). ? Fruits: Fruit canned in syrup. ? Meats and other protein foods: Beef. Pork. Lamb. Poultry with skin. Hot dogs. [place] Salines. ? Dairy: Ice cream. Sour cream. Whole milk. ? Beverages: Juice. Sugar-sweetened soft drinks. Beer. Liquor and spirits. ? Fats and oils: Butter. Canola oil. Vegetable oil. Beef fat (tallow). Lard. ? Sweets and desserts:  Cookies. Cakes. Pies. Candy. ? Seasoning and other foods: Mayonnaise. Premade sauces and marinades. ? The items listed may not be a complete list. Talk with your dietitian about what dietary choices are right for you. Summary  The Mediterranean diet includes both food and lifestyle choices.  Eat a variety of fresh fruits and vegetables, beans, nuts, seeds, and whole grains.  Limit the amount of red meat and sweets that you eat.  Talk with your health care provider about whether it is safe for you to drink red wine in moderation. This means 1 glass a day for nonpregnant women and 2 glasses a day for men. A glass of wine equals 5 oz (150 mL). This information is not intended to replace advice given to you by your  health care provider. Make sure you discuss any questions you have with your health care provider. Document Released: 01/14/2016 Document Revised: 02/16/2016 Document Reviewed: 01/14/2016 Elsevier Interactive Patient Education  2019 [employer].

## 2018-07-03 NOTE — Progress Notes (Signed)
Cardiology Office Note:    Date:  07/03/2018   ID:  Rhonda, [name] [birth year], MRN [id number]  PCP:  Mardi Mainland, [place]  Cardiologist:  No primary care provider on file.  Electrophysiologist:  None   Referring MD: No ref. provider found   Follow-up lower extremity swelling and diastolic heart failure  History of Present Illness:    Rhonda [name] is a 63 y.o. female with a hx of HFpEF, coronary artery disease with MI at age 40 and PCI with associated systolic heart failure with recovery of ejection fraction.  She also has hypertension, atrial fibrillation on Eliquis, and lower extremity edema.  She presents today for follow-up.  She had a recent hospitalization in January for pneumonia, and was again noted to be in atrial fibrillation.  She was seen in consultation by my colleague Dr. Curt Bears (EP) and is felt to have permanent atrial fibrillation.  Rate control with diltiazem was increased and she was continued on metoprolol.  She has increased biatrial chamber size on echo, and is tolerating rate control well.  She feels much better after her recovery from hospitalization.  She was tested for the flu and it was negative.  She does feel that she has increased weight since hospitalization.  She notes her weight to be 219 pounds and feels that her dry weight is 200 pounds.  She has been taking furosemide 40 mg daily and has not been on a titration schedule based on daily weights.  The patient denies chest pain, chest pressure, dyspnea at rest or with exertion, palpitations, PND, orthopnea. Denies syncope or presyncope. Denies dizziness or lightheadedness.  Endorses snoring and has not yet been evaluated for sleep apnea.  Denies fever, chills, worsening cough, dysuria, urinary frequency, bleeding complications, nausea, vomiting, diarrhea.  Feels well and denies malaise, fatigue.  Past Medical History:  Diagnosis Date  . Atrial fibrillation ([place])   . CAD (coronary artery  disease) 08/2009   BMS to RCA, non-obstructive in CX and LAD 2011  . CHF (congestive heart failure) ([place])   . Hypertension     Past Surgical History:  Procedure Laterality Date  . CESAREAN SECTION     x 1. no BTL  . CORONARY ANGIOPLASTY WITH STENT PLACEMENT    . DILATION AND CURETTAGE, DIAGNOSTIC / THERAPEUTIC     for SAB    Current Medications: Current Meds  Medication Sig  . albuterol (PROVENTIL HFA;VENTOLIN HFA) 108 (90 Base) MCG/ACT inhaler Inhale 2 puffs into the lungs every 6 (six) hours as needed for wheezing or shortness of breath.  Marland Kitchen apixaban (ELIQUIS) 5 MG TABS tablet Take 5 mg by mouth 2 (two) times daily.  Marland Kitchen atorvastatin (LIPITOR) 20 MG tablet Take 1 tablet (20 mg total) by mouth daily.  Marland Kitchen diltiazem (CARDIZEM CD) 360 MG 24 hr capsule Take 1 capsule (360 mg total) by mouth daily.  . furosemide (LASIX) 40 MG tablet Take 1 tablet (40 mg total) by mouth 2 (two) times daily. For 2 weeks than resume 40mg  daily  . metoprolol (LOPRESSOR) 100 MG tablet Take 1 tablet (100 mg total) by mouth every 12 (twelve) hours.  . metroNIDAZOLE (FLAGYL) 500 MG tablet Take two tablets by mouth twice a day, for one day.  Or you can take all four tablets at once if you can tolerate it.  . norethindrone (AYGESTIN) 5 MG tablet 10mg  po q6h x 3d, 10mg  q8h x 3, 10mg  q12h thereafter  . spironolactone (ALDACTONE) 25 MG tablet Take 0.5  tablets (12.5 mg total) by mouth daily.  . [DISCONTINUED] furosemide (LASIX) 40 MG tablet Take 1 tablet (40 mg total) by mouth daily.  . [DISCONTINUED] predniSONE (STERAPRED UNI-PAK 21 TAB) 10 MG (21) TBPK tablet Taper by 10 mg p.o. daily     Allergies:   Ace inhibitors and Prednisone   Social History   Socioeconomic History  . Marital status: Divorced    Spouse name: Not on file  . Number of children: Not on file  . Years of education: Not on file  . Highest education level: Not on file  Occupational History  . Not on file  Social Needs  . Financial resource  strain: Not on file  . Food insecurity:    Worry: Not on file    Inability: Not on file  . Transportation needs:    Medical: Not on file    Non-medical: Not on file  Tobacco Use  . Smoking status: Current Every Day Smoker    Packs/day: 0.20    Types: Cigarettes  . Smokeless tobacco: Never Used  Substance and Sexual Activity  . Alcohol use: Yes    Alcohol/week: 2.0 standard drinks    Types: 2 Cans of beer per week    Comment: Former heavy drinker, with whiskey. Was in [place] but is not drinking beer again.,   . Drug use: Yes    Types: Marijuana  . Sexual activity: Yes    Birth control/protection: Post-menopausal  Lifestyle  . Physical activity:    Days per week: Not on file    Minutes per session: Not on file  . Stress: Not on file  Relationships  . Social connections:    Talks on phone: Not on file    Gets together: Not on file    Attends religious service: Not on file    Active member of club or organization: Not on file    Attends meetings of clubs or organizations: Not on file    Relationship status: Not on file  Other Topics Concern  . Not on file  Social History Narrative  . Not on file     Family History: The patient's family history includes CAD in her maternal grandmother and mother; Diabetes Mellitus II in her father; Hypertension in her sister.  ROS:   Please see the history of present illness.    All other systems reviewed and are negative.  EKGs/Labs/Other Studies Reviewed:    The following studies were reviewed today:  EKG: Atrial fibrillation, ventricular rate 82 bpm  Recent Labs: 06/20/2018: B Natriuretic Peptide 458.0 06/21/2018: BUN 15; Creatinine, Ser 0.73; Hemoglobin 14.2; Platelets 176; Potassium 3.7; Sodium 135  Recent Lipid Panel No results found for: CHOL, TRIG, HDL, CHOLHDL, VLDL, LDLCALC, LDLDIRECT  Physical Exam:    VS:  BP 98/70   Pulse 82   Ht 5\' 3"  (1.6 m)   Wt 218 lb 12.8 oz (99.2 kg)   BMI 38.76 kg/m     Wt Readings from  Last 3 Encounters:  07/03/18 218 lb 12.8 oz (99.2 kg)  06/24/18 205 lb 11.2 oz (93.3 kg)  05/28/18 205 lb (93 kg)     Constitutional: No acute distress ENMT: moist mucous membranes  Cardiovascular: regular rhythm, normal rate, no murmurs. S1 and S2 normal. Radial pulses normal bilaterally. No jugular venous distention.  Respiratory: clear to auscultation bilaterally GI : normal bowel sounds, soft and nontender. No distention.   MSK: extremities warm, well perfused. Bilateral edema, left greater than right, compression stockings in  place.Marland Kitchen  NEURO: grossly nonfocal exam, moves all extremities. PSYCH: alert and oriented x 3, normal mood and affect.   ASSESSMENT:    1. Permanent atrial fibrillation   2. Acute on chronic diastolic CHF (congestive heart failure) (H[facility])    PLAN:   Her weight has increased and she feels that she is volume overloaded.  We will plan for Lasix titration.  She will take Lasix 40 mg twice daily for 2 weeks to attempt to return to her dry weight and improve her lower extremity swelling.  We will provide potassium supplementation during this time.  Of note she is on spironolactone, and we will check an electrolyte panel today as well as in 2 weeks to ensure that electrolytes are stable.  I have encouraged her to call with any symptoms that are concerning during this time.  I have recommended daily weights and daily blood pressures and for her to record this log and for [place] to review it at her next appointment.  She is otherwise feeling well, asymptomatic from her atrial fibrillation, and continuing on Eliquis 5 mg twice daily without bleeding complication.  She will focus on remaining smoke-free, and she quit during her hospitalization.  I have commended her for this.  She will also focus on a low-sodium diet, Mediterranean diet recommendations, and getting back to an exercise regimen.  She walks her dogs.  I will see her back in 3 months or sooner as needed.  Medication  Adjustments/Labs and Tests Ordered: Current medicines are reviewed at length with the patient today.  Concerns regarding medicines are outlined above.  Orders Placed This Encounter  Procedures  . Basic metabolic panel  . Basic metabolic panel  . EKG 12-Lead   Meds ordered this encounter  Medications  . DISCONTD: potassium chloride SA (K-DUR,KLOR-CON) 20 MEQ tablet    Sig: Take 1 tablet (20 mEq total) by mouth daily.    Dispense:  30 tablet    Refill:  5  . furosemide (LASIX) 40 MG tablet    Sig: Take 1 tablet (40 mg total) by mouth 2 (two) times daily. For 2 weeks than resume 40mg  daily    Dispense:  60 tablet    Refill:  0  . potassium chloride SA (K-DUR,KLOR-CON) 20 MEQ tablet    Sig: Take 1 tablet (20 mEq total) by mouth daily. For 2 weeks while taking Lasix twice daily    Dispense:  15 tablet    Refill:  0    Disregard prior transmission    Patient Instructions  Medication Instructions:  Your physician has recommended you make the following change in your medication:  1) INCREASE Lasix to 40mg  twice daily for 2 weeks than resume 40mg  once daily. 2) START Potassium 61mEq daily for 2 weeks.   If you need a refill on your cardiac medications before your next appointment, please call your pharmacy.   Lab work: Teacher, English as a foreign language recommends that you return for lab work in: 2 weeks (Bmet)  If you have labs (blood work) drawn today and your tests are completely normal, you will receive your results only by: Marland Kitchen MyChart Message (if you have MyChart) OR . A paper copy in the mail If you have any lab test that is abnormal or we need to change your treatment, we will call you to review the results.  Testing/Procedures: None ordered  Follow-Up: At [facility], you and your health needs are our priority.  As part of our continuing mission  to provide you with exceptional heart care, we have created designated Provider Care Teams.  These Care Teams include your primary  Cardiologist (physician) and Advanced Practice Providers (APPs -  Physician Assistants and Nurse Practitioners) who all work together to provide you with the care you need, when you need it. You will need a follow up appointment in 3 months.    You may see No primary care provider on file. Dr. or one of the following Advanced Practice Providers on your designated Care Team:   Rosaria Ferries, PA-C . Jory Sims, DNP, ANP  Any Other Special Instructions Will Be Listed Below (If Applicable). Your physician recommends that you weigh, daily, at the same time every day, and in the same amount of clothing. Please record your daily weights on the handout provided and bring it to your next appointment.  We will forward a message to our sleep coordinator Mariann Laster to contact you to reschedule your at home sleep study       [place] refers to food and lifestyle choices that are based on the traditions of countries located on the [employer]. This way of eating has been shown to help prevent certain conditions and improve outcomes for people who have chronic diseases, like kidney disease and heart disease. What are tips for following this plan? Lifestyle  Cook and eat meals together with your family, when possible.  Drink enough fluid to keep your urine clear or pale yellow.  Be physically active every day. This includes: ? Aerobic exercise like running or swimming. ? Leisure activities like gardening, walking, or housework.  Get 7-8 hours of sleep each night.  If recommended by your health care provider, drink red wine in moderation. This means 1 glass a day for nonpregnant women and 2 glasses a day for men. A glass of wine equals 5 oz (150 mL). Reading food labels   Check the serving size of packaged foods. For foods such as rice and pasta, the serving size refers to the amount of cooked product, not dry.  Check the total fat in packaged foods.  Avoid foods that have saturated fat or trans fats.  Check the ingredients list for added sugars, such as corn syrup. Shopping  At the grocery store, buy most of your food from the areas near the walls of the store. This includes: ? Fresh fruits and vegetables (produce). ? Grains, beans, nuts, and seeds. Some of these may be available in unpackaged forms or large amounts (in bulk). ? Fresh seafood. ? Poultry and eggs. ? Low-fat dairy products.  Buy whole ingredients instead of prepackaged foods.  Buy fresh fruits and vegetables in-season from local farmers markets.  Buy frozen fruits and vegetables in resealable bags.  If you do not have access to quality fresh seafood, buy precooked frozen shrimp or canned fish, such as tuna, salmon, or sardines.  Buy small amounts of raw or cooked vegetables, salads, or olives from the deli or salad bar at your store.  Stock your pantry so you always have certain foods on hand, such as olive oil, canned tuna, canned tomatoes, rice, pasta, and beans. Cooking  Cook foods with extra-virgin olive oil instead of using butter or other vegetable oils.  Have meat as a side dish, and have vegetables or grains as your main dish. This means having meat in small portions or adding small amounts of meat to foods like pasta or stew.  Use beans or vegetables instead of meat in  common dishes like chili or lasagna.  Experiment with different cooking methods. Try roasting or broiling vegetables instead of steaming or sauteing them.  Add frozen vegetables to soups, stews, pasta, or rice.  Add nuts or seeds for added healthy fat at each meal. You can add these to yogurt, salads, or vegetable dishes.  Marinate fish or vegetables using olive oil, lemon juice, garlic, and fresh herbs. Meal planning   Plan to eat 1 vegetarian meal one day each week. Try to work up to 2 vegetarian meals, if possible.  Eat seafood 2 or more times a week.  Have healthy snacks  readily available, such as: ? Vegetable sticks with hummus. ? [place] yogurt. ? Fruit and nut trail mix.  Eat balanced meals throughout the week. This includes: ? Fruit: 2-3 servings a day ? Vegetables: 4-5 servings a day ? Low-fat dairy: 2 servings a day ? Fish, poultry, or lean meat: 1 serving a day ? Beans and legumes: 2 or more servings a week ? Nuts and seeds: 1-2 servings a day ? Whole grains: 6-8 servings a day ? Extra-virgin olive oil: 3-4 servings a day  Limit red meat and sweets to only a few servings a month What are my food choices?  Mediterranean diet ? Recommended ? Grains: Whole-grain pasta. Brown rice. Bulgar wheat. Polenta. Couscous. Whole-wheat bread. [place]. ? Vegetables: Artichokes. Beets. Broccoli. Cabbage. Carrots. Eggplant. Green beans. Chard. Kale. Spinach. Onions. Leeks. Peas. Squash. Tomatoes. Peppers. Radishes. ? Fruits: Apples. Apricots. Avocado. Berries. Bananas. Cherries. Dates. Figs. Grapes. Lemons. Melon. Oranges. Peaches. Plums. Pomegranate. ? Meats and other protein foods: Beans. Almonds. Sunflower seeds. Pine nuts. Peanuts. [place]. Salmon. Scallops. Shrimp. Ribera. Tilapia. Clams. Oysters. Eggs. ? Dairy: Low-fat milk. Cheese. Greek yogurt. ? Beverages: Water. Red wine. Herbal tea. ? Fats and oils: Extra virgin olive oil. Avocado oil. Grape seed oil. ? Sweets and desserts: [place] yogurt with honey. Baked apples. Poached pears. Trail mix. ? Seasoning and other foods: Basil. Cilantro. Coriander. Cumin. Mint. Parsley. Sage. Rosemary. Tarragon. Garlic. Oregano. Thyme. Pepper. Balsalmic vinegar. Tahini. Hummus. Tomato sauce. Olives. Mushrooms. ? Limit these ? Grains: Prepackaged pasta or rice dishes. Prepackaged cereal with added sugar. ? Vegetables: Deep fried potatoes (french fries). ? Fruits: Fruit canned in syrup. ? Meats and other protein foods: Beef. Pork. Lamb. Poultry with skin. Hot dogs. [place] Salines. ? Dairy: Ice cream. Sour cream. Whole  milk. ? Beverages: Juice. Sugar-sweetened soft drinks. Beer. Liquor and spirits. ? Fats and oils: Butter. [place] oil. Vegetable oil. Beef fat (tallow). Lard. ? Sweets and desserts: Cookies. Cakes. Pies. Candy. ? Seasoning and other foods: Mayonnaise. Premade sauces and marinades. ? The items listed may not be a complete list. Talk with your dietitian about what dietary choices are right for you. Summary  The Mediterranean diet includes both food and lifestyle choices.  Eat a variety of fresh fruits and vegetables, beans, nuts, seeds, and whole grains.  Limit the amount of red meat and sweets that you eat.  Talk with your health care provider about whether it is safe for you to drink red wine in moderation. This means 1 glass a day for nonpregnant women and 2 glasses a day for men. A glass of wine equals 5 oz (150 mL). This information is not intended to replace advice given to you by your health care provider. Make sure you discuss any questions you have with your health care provider. Document Released: 01/14/2016 Document Revised: 02/16/2016 Document Reviewed: 01/14/2016 Elsevier Interactive Patient Education  2019 [place], Elouise Munroe, MD  07/03/2018 10:05 AM    [place]

## 2018-07-04 ENCOUNTER — Encounter: Payer: Self-pay | Admitting: *Deleted

## 2018-07-04 ENCOUNTER — Other Ambulatory Visit: Payer: Self-pay | Admitting: *Deleted

## 2018-07-04 MED ORDER — FLUCONAZOLE 150 MG PO TABS: 150.0000 mg | ORAL_TABLET | Freq: Once | ORAL | 1 refills | Status: AC

## 2018-07-05 ENCOUNTER — Ambulatory Visit
Admission: RE | Admit: 2018-07-05 | Discharge: 2018-07-05 | Disposition: A | Discharge status: Home / Self Care | Payer: Medicare HMO | Source: Ambulatory Visit | Attending: Obstetrics and Gynecology | Admitting: Obstetrics and Gynecology

## 2018-07-05 DIAGNOSIS — J189 Pneumonia, unspecified organism: Secondary | ICD-10-CM | POA: Diagnosis not present

## 2018-07-05 DIAGNOSIS — Z1231 Encounter for screening mammogram for malignant neoplasm of breast: Secondary | ICD-10-CM | POA: Insufficient documentation

## 2018-07-05 DIAGNOSIS — I4891 Unspecified atrial fibrillation: Secondary | ICD-10-CM | POA: Diagnosis not present

## 2018-07-07 ENCOUNTER — Emergency Department
Admission: EM | Admit: 2018-07-07 | Discharge: 2018-07-07 | Disposition: A | Discharge status: Home / Self Care | Payer: Medicare HMO | Attending: Emergency Medicine | Admitting: Emergency Medicine

## 2018-07-07 ENCOUNTER — Other Ambulatory Visit: Payer: Self-pay

## 2018-07-07 DIAGNOSIS — R69 Illness, unspecified: Secondary | ICD-10-CM | POA: Diagnosis not present

## 2018-07-07 DIAGNOSIS — I5033 Acute on chronic diastolic (congestive) heart failure: Secondary | ICD-10-CM | POA: Diagnosis not present

## 2018-07-07 DIAGNOSIS — I110 Hypertensive heart disease with heart failure: Secondary | ICD-10-CM | POA: Diagnosis not present

## 2018-07-07 DIAGNOSIS — I2510 Atherosclerotic heart disease of native coronary artery without angina pectoris: Secondary | ICD-10-CM | POA: Insufficient documentation

## 2018-07-07 DIAGNOSIS — Z7901 Long term (current) use of anticoagulants: Secondary | ICD-10-CM | POA: Diagnosis not present

## 2018-07-07 DIAGNOSIS — L304 Erythema intertrigo: Secondary | ICD-10-CM | POA: Diagnosis not present

## 2018-07-07 DIAGNOSIS — R102 Pelvic and perineal pain: Secondary | ICD-10-CM | POA: Diagnosis present

## 2018-07-07 DIAGNOSIS — Z79899 Other long term (current) drug therapy: Secondary | ICD-10-CM | POA: Diagnosis not present

## 2018-07-07 DIAGNOSIS — F17210 Nicotine dependence, cigarettes, uncomplicated: Secondary | ICD-10-CM | POA: Diagnosis not present

## 2018-07-07 MED ORDER — NYSTATIN 100000 UNIT/GM EX OINT: 1.0000 "application " | TOPICAL_OINTMENT | Freq: Two times a day (BID) | CUTANEOUS | 0 refills | Status: DC

## 2018-07-07 MED ORDER — NYSTATIN 100000 UNIT/GM EX OINT
TOPICAL_OINTMENT | Freq: Once | CUTANEOUS | Status: AC
  Administered 2018-07-07: 03:00:00 via TOPICAL
  Filled 2018-07-07: qty 15

## 2018-07-07 NOTE — ED Provider Notes (Signed)
[facility] Emergency Department Provider Note   ____________________________________________   First MD Initiated Contact with Patient 07/07/18 0159     (approximate)  I have reviewed the triage vital signs and the nursing notes.   HISTORY  Chief Complaint Groin Pain    HPI Rhonda [name] is a 63 y.o. female who presents to the ED from home with a chief complaint of rash.  Patient recently finished several rounds of antibiotics for pneumonia.  Began to experience painful rash in both groin 6 days ago.  Her GYN phoned in [place] on Tuesday which patient took and states her symptoms improved but has since worsened.  Complains of feeling overall in the area between her outer vagina and thighs.  Denies associated fever, chills, chest pain, shortness breath, abdominal pain, nausea, vomiting or dysuria.    Past Medical History:  Diagnosis Date  . Atrial fibrillation ([place])   . CAD (coronary artery disease) 08/2009   BMS to RCA, non-obstructive in CX and LAD 2011  . CHF (congestive heart failure) ([place])   . Hypertension   No history of diabetes  Patient Active Problem List   Diagnosis Date Noted  . Pneumonia 06/20/2018  . Trichomoniasis 06/05/2018  . Postmenopausal bleeding 05/29/2018  . Acute on chronic diastolic CHF (congestive heart failure) ([place])   . Acute on chronic congestive heart failure ([place]) 02/03/2016  . CHF (congestive heart failure), NYHA class I ([place]) 02/03/2016  . Essential hypertension 02/03/2016  . Atrial fibrillation ([place]) 02/03/2016  . Atrial flutter [facility])     Past Surgical History:  Procedure Laterality Date  . CESAREAN SECTION     x 1. no BTL  . CORONARY ANGIOPLASTY WITH STENT PLACEMENT    . DILATION AND CURETTAGE, DIAGNOSTIC / THERAPEUTIC     for SAB    Prior to Admission medications   Medication Sig Start Date End Date Taking? Authorizing Provider  albuterol (PROVENTIL HFA;VENTOLIN HFA) 108 (90 Base) MCG/ACT inhaler  Inhale 2 puffs into the lungs every 6 (six) hours as needed for wheezing or shortness of breath. 06/24/18   Epifanio Lesches, MD  apixaban (ELIQUIS) 5 MG TABS tablet Take 5 mg by mouth 2 (two) times daily.    [provider]  atorvastatin (LIPITOR) 20 MG tablet Take 1 tablet (20 mg total) by mouth daily. 02/08/16   Donne Hazel, MD  diltiazem (CARDIZEM CD) 360 MG 24 hr capsule Take 1 capsule (360 mg total) by mouth daily. 06/24/18   Epifanio Lesches, MD  furosemide (LASIX) 40 MG tablet Take 1 tablet (40 mg total) by mouth 2 (two) times daily. For 2 weeks than resume 40mg  daily 07/03/18   Elouise Munroe, MD  metoprolol (LOPRESSOR) 100 MG tablet Take 1 tablet (100 mg total) by mouth every 12 (twelve) hours. 02/08/16   Donne Hazel, MD  metroNIDAZOLE (FLAGYL) 500 MG tablet Take two tablets by mouth twice a day, for one day.  Or you can take all four tablets at once if you can tolerate it. 06/05/18   Aletha Halim, MD  norethindrone (AYGESTIN) 5 MG tablet 10mg  po q6h x 3d, 10mg  q8h x 3, 10mg  q12h thereafter 06/27/18   Aletha Halim, MD  nystatin ointment (MYCOSTATIN) Apply 1 application topically 2 (two) times daily. 07/07/18   Paulette Blanch, MD  potassium chloride SA (K-DUR,KLOR-CON) 20 MEQ tablet Take 1 tablet (20 mEq total) by mouth daily. For 2 weeks while taking Lasix twice daily 07/03/18   Cherlynn Kaiser  A, MD  spironolactone (ALDACTONE) 25 MG tablet Take 0.5 tablets (12.5 mg total) by mouth daily. 02/08/16   Donne Hazel, MD    Allergies Ace inhibitors and Prednisone  Family History  Problem Relation Age of Onset  . CAD Mother   . Diabetes Mellitus II Father   . Hypertension Sister   . CAD Maternal Grandmother     Social History Social History   Tobacco Use  . Smoking status: Current Every Day Smoker    Packs/day: 0.20    Types: Cigarettes  . Smokeless tobacco: Never Used  Substance Use Topics  . Alcohol use: Yes    Alcohol/week: 2.0 standard drinks     Types: 2 Cans of beer per week    Comment: Former heavy drinker, with whiskey. Was in [place] but is not drinking beer again.,   . Drug use: Yes    Types: Marijuana    Review of Systems  Constitutional: No fever/chills Eyes: No visual changes. ENT: No sore throat. Cardiovascular: Denies chest pain. Respiratory: Denies shortness of breath. Gastrointestinal: No abdominal pain.  No nausea, no vomiting.  No diarrhea.  No constipation. Genitourinary: Positive for rash.  Negative for dysuria. Musculoskeletal: Negative for back pain. Skin: Negative for rash. Neurological: Negative for headaches, focal weakness or numbness.   ____________________________________________   PHYSICAL EXAM:  VITAL SIGNS: ED Triage Vitals  Enc Vitals Group     BP 07/07/18 0130 (!) 98/54     Pulse Rate 07/07/18 0129 96     Resp 07/07/18 0129 16     Temp 07/07/18 0129 97.8 F (36.6 C)     Temp Source 07/07/18 0129 Oral     SpO2 07/07/18 0129 95 %     Weight 07/07/18 0129 219 lb (99.3 kg)     Height 07/07/18 0129 5\' 3"  (1.6 m)     Head Circumference --      Peak Flow --      Pain Score 07/07/18 0130 1     Pain Loc --      Pain Edu? --      Excl. in [place]? --     Constitutional: Alert and oriented. Well appearing and in no acute distress. Eyes: Conjunctivae are normal. PERRL. EOMI. Head: Atraumatic. Nose: No congestion/rhinnorhea. Mouth/Throat: Mucous membranes are moist.  Oropharynx non-erythematous. Neck: No stridor.   Cardiovascular: Normal rate, regular rhythm. Grossly normal heart sounds.  Good peripheral circulation. Respiratory: Normal respiratory effort.  No retractions. Lungs CTAB. Gastrointestinal: Soft and nontender. No distention. No abdominal bruits. No CVA tenderness. Genitourinary: Intertriginous rash to both groin as well as outer vaginal lips. Musculoskeletal: No lower extremity tenderness nor edema.  No joint effusions. Neurologic:  Normal speech and language. No gross focal  neurologic deficits are appreciated. No gait instability. Skin:  Skin is warm, dry and intact. No rash noted. Psychiatric: Mood and affect are normal. Speech and behavior are normal.  ____________________________________________   LABS (all labs ordered are listed, but only abnormal results are displayed)  Labs Reviewed - No data to display ____________________________________________  EKG  None ____________________________________________  RADIOLOGY  ED MD interpretation: None  Official radiology report(s): No results found.  ____________________________________________   PROCEDURES  Procedure(s) performed: None  Procedures  Critical Care performed: No  ____________________________________________   INITIAL IMPRESSION / ASSESSMENT AND PLAN / ED COURSE  As part of my medical decision making, I reviewed the following data within the electronic MEDICAL RECORD NUMBER History obtained from family, Nursing notes reviewed and  incorporated and Notes from prior ED visits    63 year old female who presents with intertriginous rash likely secondary to recent antibiotic use.  Will apply nystatin ointment and discharged with prescription.  She will follow-up closely with her PCP.  Strict return precautions given.  Patient verbalizes understanding and agrees with plan of care.      ____________________________________________   FINAL CLINICAL IMPRESSION(S) / ED DIAGNOSES  Final diagnoses:  Intertrigo     ED Discharge Orders         Ordered    nystatin ointment (MYCOSTATIN)  2 times daily     07/07/18 0218           Note:  This document was prepared using Dragon voice recognition software and may include unintentional dictation errors.    Paulette Blanch, MD 07/07/18 [phone]

## 2018-07-07 NOTE — ED Notes (Signed)
Waiting on nystatin ointment.

## 2018-07-07 NOTE — Discharge Instructions (Addendum)
1.  Apply Nystatin ointment to affected area twice daily as needed. 2.  Return to the ER for worsening symptoms, persistent vomiting, fever, difficulty breathing or other concerns.

## 2018-07-07 NOTE — ED Notes (Signed)
Waiting for mycostatin from pharmacy

## 2018-07-07 NOTE — ED Triage Notes (Signed)
Pt states area between vaginal lips and legs is raw, has red bumps and is painful. Pt just finished several rounds of antibiotics for pneumonia. Pt took diflucan on Tuesday, symptoms got better, but are now worsening.

## 2018-07-12 DIAGNOSIS — N898 Other specified noninflammatory disorders of vagina: Secondary | ICD-10-CM | POA: Diagnosis not present

## 2018-07-17 DIAGNOSIS — I5033 Acute on chronic diastolic (congestive) heart failure: Secondary | ICD-10-CM | POA: Diagnosis not present

## 2018-07-18 LAB — BASIC METABOLIC PANEL
BUN/Creatinine Ratio: 11 — ABNORMAL LOW (ref 12–28)
BUN: 11 mg/dL (ref 8–27)
CO2: 24 mmol/L (ref 20–29)
Calcium: 9.8 mg/dL (ref 8.7–10.3)
Chloride: 95 mmol/L — ABNORMAL LOW (ref 96–106)
Creatinine, Ser: 1.01 mg/dL — ABNORMAL HIGH (ref 0.57–1.00)
GFR calc non Af Amer: 61 mL/min/{1.73_m2} (ref >59)
GFR, EST AFRICAN AMERICAN: 70 mL/min/{1.73_m2} (ref >59)
Glucose: 91 mg/dL (ref 65–99)
Potassium: 4.2 mmol/L (ref 3.5–5.2)
Sodium: 139 mmol/L (ref 134–144)

## 2018-07-19 ENCOUNTER — Ambulatory Visit (INDEPENDENT_AMBULATORY_CARE_PROVIDER_SITE_OTHER): Payer: Medicare HMO | Admitting: Obstetrics and Gynecology

## 2018-07-19 ENCOUNTER — Encounter: Payer: Self-pay | Admitting: Obstetrics and Gynecology

## 2018-07-19 VITALS — BP 105/74 | HR 97 | Wt 207.0 lb

## 2018-07-19 DIAGNOSIS — Z8742 Personal history of other diseases of the female genital tract: Secondary | ICD-10-CM | POA: Diagnosis not present

## 2018-07-19 MED ORDER — FLUCONAZOLE 150 MG PO TABS: 150.0000 mg | ORAL_TABLET | ORAL | 0 refills | Status: DC

## 2018-07-19 MED ORDER — METRONIDAZOLE 0.75 % VA GEL: 1.0000 | Freq: Every day | VAGINAL | 0 refills | Status: DC

## 2018-07-19 NOTE — Progress Notes (Signed)
Pt is not bleeding, she never took the megace. Pt was admitted for pneumonia and after she had dose of prednisone and pt stated after that bleeding stopped.

## 2018-07-19 NOTE — Progress Notes (Signed)
Obstetrics and Gynecology Visit Return Patient Evaluation  Appointment Date: 07/19/2018  Primary Care Provider: Brown, [place] for [facility]  Chief Complaint: f/u PMB  History of Present Illness:  Rhonda [name] is a 63 y.o. with above CC. Since last visit, pt was admitted for a few days for acute respiratory issues. She did have her u/s done with results below. Pt discharged from hospital on 1/19 and that was last time she had any spotting or bleeding.   Pap last visit negative except for trich (pt treated), and embx w/ no e/o maligncy with proliferative endometrium seen.    Review of Systems:  as noted in the History of Present Illness.  Patient Active Problem List   Diagnosis Date Noted  . Pneumonia 06/20/2018  . Trichomoniasis 06/05/2018  . Postmenopausal bleeding 05/29/2018  . Acute on chronic diastolic CHF (congestive heart failure) ([place])   . Acute on chronic congestive heart failure ([place]) 02/03/2016  . CHF (congestive heart failure), NYHA class I ([place]) 02/03/2016  . Essential hypertension 02/03/2016  . Atrial fibrillation ([place]) 02/03/2016  . Atrial flutter ([place])    Medications:  Lisbeth Ply "[name]" had no medications administered during this visit. Current Outpatient Medications  Medication Sig Dispense Refill  . albuterol (PROVENTIL HFA;VENTOLIN HFA) 108 (90 Base) MCG/ACT inhaler Inhale 2 puffs into the lungs every 6 (six) hours as needed for wheezing or shortness of breath. 1 Inhaler 2  . apixaban (ELIQUIS) 5 MG TABS tablet Take 5 mg by mouth 2 (two) times daily.    Marland Kitchen atorvastatin (LIPITOR) 20 MG tablet Take 1 tablet (20 mg total) by mouth daily. 30 tablet 0  . diltiazem (CARDIZEM CD) 360 MG 24 hr capsule Take 1 capsule (360 mg total) by mouth daily. 30 capsule 0  . furosemide (LASIX) 40 MG tablet Take 1 tablet (40 mg total) by mouth 2 (two) times daily. For 2 weeks than resume 40mg  daily 60 tablet 0  .  metoprolol (LOPRESSOR) 100 MG tablet Take 1 tablet (100 mg total) by mouth every 12 (twelve) hours. 60 tablet 0  . potassium chloride SA (K-DUR,KLOR-CON) 20 MEQ tablet Take 1 tablet (20 mEq total) by mouth daily. For 2 weeks while taking Lasix twice daily 15 tablet 0  . spironolactone (ALDACTONE) 25 MG tablet Take 0.5 tablets (12.5 mg total) by mouth daily. 30 tablet 0  . fluconazole (DIFLUCAN) 150 MG tablet Take 1 tablet (150 mg total) by mouth every 3 (three) days. For three doses 3 tablet 0  . metroNIDAZOLE (FLAGYL) 500 MG tablet Take two tablets by mouth twice a day, for one day.  Or you can take all four tablets at once if you can tolerate it. (Patient not taking: Reported on 07/19/2018) 4 tablet 0  . metroNIDAZOLE (METROGEL) 0.75 % vaginal gel Place 1 Applicatorful vaginally at bedtime. Apply one applicatorful to vagina at bedtime for 5 days 70 g 0  . norethindrone (AYGESTIN) 5 MG tablet 10mg  po q6h x 3d, 10mg  q8h x 3, 10mg  q12h thereafter (Patient not taking: Reported on 07/19/2018) 90 tablet 1  . nystatin ointment (MYCOSTATIN) Apply 1 application topically 2 (two) times daily. (Patient not taking: Reported on 07/19/2018) 30 g 0   No current facility-administered medications for this visit.     Allergies: is allergic to ace inhibitors and prednisone.  Physical Exam:  BP 105/74   Pulse 97   Wt 207 lb (93.9 kg)   BMI 36.67 kg/m  Body  mass index is 36.67 kg/m. General appearance: Well nourished, well developed female in no acute distress.  Neuro/Psych:  Normal mood and affect.    Radiology CLINICAL DATA:  Vaginal bleeding  EXAM: TRANSABDOMINAL AND TRANSVAGINAL ULTRASOUND OF PELVIS  TECHNIQUE: Both transabdominal and transvaginal ultrasound examinations of the pelvis were performed. Transabdominal technique was performed for global imaging of the pelvis including uterus, ovaries, adnexal regions, and pelvic cul-de-sac. It was necessary to proceed with endovaginal exam following  the transabdominal exam to visualize the endometrium and bilateral ovaries.  COMPARISON:  None  FINDINGS: Uterus  Measurements: 9.5 x 4.6 x 5.1 cm = volume: 115.8 mL. No fibroids or other mass visualized.  Endometrium  Thickness: 11.5 mm, abnormally thickened for patient age. The endometrium is coarse with mild heterogeneous echotexture.  Right ovary  Not visualized.  Left ovary  Not visualized.  Other findings  No abnormal free fluid.  IMPRESSION: Abnormal thickened endometrium for patient's age. The uterus is normal.  Bilateral ovaries are not visualized.   Electronically Signed   By: Abelardo Diesel M.D.   On: 06/22/2018 14:42  Assessment: pt stable  Plan: I told patient that there isn't any accepted course of action in her case since her PMB stopped, her embx is benign and her u/s just shows ES of 18mm and normal uterine anatomy. I told her that I looked at the u/s images and the ES doesn't show any vascularity b/c if it did I would recommend hysteroscopy d&c. I also told her that since her PMB continued i'd also recommend h/s, d&c. I told her that given that her stripe is thickened and she did show proliferative endometrium, some recommend oral progestins or for a limited course or even an IUD as she has a theoretical risk of hyperplasia progressing, but I told her there isn't any great data as to what this risk is in terms of progressing to cancer.   I told her if she has repeat bleeding then I defintely recommend h/s, d&c but in the absence of any more s/s I recommend a repeat embx in 77m.   Pt is amenable to plan.   RTC: 80m for repeat embx  Durene Romans MD Attending Center for [facility])

## 2018-07-21 ENCOUNTER — Encounter: Payer: Self-pay | Admitting: Obstetrics and Gynecology

## 2018-07-23 ENCOUNTER — Encounter (HOSPITAL_BASED_OUTPATIENT_CLINIC_OR_DEPARTMENT_OTHER): Payer: Medicare HMO

## 2018-07-31 ENCOUNTER — Ambulatory Visit (HOSPITAL_BASED_OUTPATIENT_CLINIC_OR_DEPARTMENT_OTHER): Payer: Medicare HMO | Attending: Internal Medicine | Admitting: Cardiovascular Disease

## 2018-07-31 VITALS — Ht 63.0 in | Wt 205.0 lb

## 2018-07-31 DIAGNOSIS — I480 Paroxysmal atrial fibrillation: Secondary | ICD-10-CM | POA: Diagnosis not present

## 2018-07-31 DIAGNOSIS — Z79899 Other long term (current) drug therapy: Secondary | ICD-10-CM | POA: Insufficient documentation

## 2018-07-31 DIAGNOSIS — G4733 Obstructive sleep apnea (adult) (pediatric): Secondary | ICD-10-CM

## 2018-07-31 DIAGNOSIS — G4719 Other hypersomnia: Secondary | ICD-10-CM

## 2018-07-31 DIAGNOSIS — I10 Essential (primary) hypertension: Secondary | ICD-10-CM | POA: Insufficient documentation

## 2018-07-31 DIAGNOSIS — Z792 Long term (current) use of antibiotics: Secondary | ICD-10-CM | POA: Diagnosis not present

## 2018-07-31 DIAGNOSIS — Z7901 Long term (current) use of anticoagulants: Secondary | ICD-10-CM | POA: Diagnosis not present

## 2018-07-31 DIAGNOSIS — R0902 Hypoxemia: Secondary | ICD-10-CM | POA: Insufficient documentation

## 2018-08-04 ENCOUNTER — Encounter (HOSPITAL_BASED_OUTPATIENT_CLINIC_OR_DEPARTMENT_OTHER): Payer: Self-pay | Admitting: Cardiovascular Disease

## 2018-08-04 NOTE — Procedures (Signed)
    Patient Name: Rhonda [name], [place] Date: 07/31/2018 Gender: Female D.O.B: March 06, 1956 Age (years): 6 Referring Provider: Cherlynn Kaiser MD Height (inches): 63 Interpreting Physician: Shelva Majestic MD, ABSM Weight (lbs): 212 RPSGT: Jacolyn Reedy BMI: 38 MRN: [id number] Neck Size: 14.50  CLINICAL INFORMATION Sleep Study Type: HST  Indication for sleep study: Hypertension, Snoring  Epworth Sleepiness Score: 22  SLEEP STUDY TECHNIQUE A multi-channel overnight portable sleep study was performed. The channels recorded were: nasal airflow, thoracic respiratory movement, and oxygen saturation with a pulse oximetry. Snoring was also monitored.  MEDICATIONS     albuterol (PROVENTIL HFA;VENTOLIN HFA) 108 (90 Base) MCG/ACT inhaler             apixaban (ELIQUIS) 5 MG TABS tablet         atorvastatin (LIPITOR) 20 MG tablet         diltiazem (CARDIZEM CD) 360 MG 24 hr capsule         fluconazole (DIFLUCAN) 150 MG tablet         furosemide (LASIX) 40 MG tablet         metoprolol (LOPRESSOR) 100 MG tablet         metroNIDAZOLE (FLAGYL) 500 MG tablet         metroNIDAZOLE (METROGEL) 0.75 % vaginal gel         norethindrone (AYGESTIN) 5 MG tablet         nystatin ointment (MYCOSTATIN)         potassium chloride SA (K-DUR,KLOR-CON) 20 MEQ tablet         spironolactone (ALDACTONE) 25 MG tablet      Patient self administered medications include: N/A.  SLEEP ARCHITECTURE Patient was studied for 398.5 minutes. The sleep efficiency was 89.0 % and the patient was supine for 61%. The arousal index was 0.0 per hour.  RESPIRATORY PARAMETERS The overall AHI was 66.5 per hour, with a central apnea index of 0.0 per hour.  The oxygen nadir was 75% during sleep.  CARDIAC DATA Mean heart rate during sleep was 92.8 bpm.  IMPRESSIONS - Severe obstructive sleep apnea occurred during this study (AHI  66.5/h); events were worse in the supine position (AHI 78.3/h). The severity of sleep apnea  during REM sleep cannot be assessed on this home study.  - No significant central sleep apnea occurred during this study (CAI = 0.0/h). - Severe oxygen desaturation to a nadir of 75%. - Patient snored 5.6% during the sleep.  DIAGNOSIS - Obstructive Sleep Apnea (327.23 [G47.33 ICD-10]) - Nocturnal Hypoxemia (327.26 [G47.36 ICD-10])  RECOMMENDATIONS - In this patient with severe sleep apnea, significant nocturnal hypoxemia and significant cardiovascular comorbidities recommend an in-lab CPAP titration study for optimal evaluation and treatment of her severe sleep disordered breathing.  - Efforts should be made to optimize nasal and oropharyngeal patency. - Positional therapy avoiding supine position during sleep. - Avoid alcohol, sedatives and other CNS depressants that may worsen sleep apnea and disrupt normal sleep architecture. - Sleep hygiene should be reviewed to assess factors that may improve sleep quality. - Weight management and regular exercise should be initiated or continued. - Recommend a download be obtained in 30 days and sleep clinic evaluation after 4 weeks of treatment.   [Electronically signed] 08/04/2018 08:59 AM  Shelva Majestic MD, [facility], [place], American Board of Sleep Medicine   NPI: [id number] [place] PH: [phone]   FX: [phone] [place]

## 2018-08-07 ENCOUNTER — Telehealth: Payer: Self-pay | Admitting: *Deleted

## 2018-08-07 ENCOUNTER — Other Ambulatory Visit: Payer: Self-pay | Admitting: Cardiovascular Disease

## 2018-08-07 DIAGNOSIS — I480 Paroxysmal atrial fibrillation: Secondary | ICD-10-CM

## 2018-08-07 DIAGNOSIS — G4733 Obstructive sleep apnea (adult) (pediatric): Secondary | ICD-10-CM

## 2018-08-07 DIAGNOSIS — I10 Essential (primary) hypertension: Secondary | ICD-10-CM

## 2018-08-07 DIAGNOSIS — I5041 Acute combined systolic (congestive) and diastolic (congestive) heart failure: Secondary | ICD-10-CM

## 2018-08-07 NOTE — Telephone Encounter (Signed)
Left message to return a call. 

## 2018-08-07 NOTE — Telephone Encounter (Signed)
-----   Message from Troy Sine, MD sent at 08/04/2018  9:03 AM EST ----- Mariann Laster, please notify pt and arrange for in-lab CPAP titration study.

## 2018-08-08 ENCOUNTER — Telehealth: Payer: Self-pay | Admitting: *Deleted

## 2018-08-08 NOTE — Telephone Encounter (Signed)
Submitted PA for CPAP titration to [facility] via web portal.

## 2018-08-09 DIAGNOSIS — R69 Illness, unspecified: Secondary | ICD-10-CM | POA: Diagnosis not present

## 2018-08-09 DIAGNOSIS — I4891 Unspecified atrial fibrillation: Secondary | ICD-10-CM | POA: Diagnosis not present

## 2018-08-09 DIAGNOSIS — J449 Chronic obstructive pulmonary disease, unspecified: Secondary | ICD-10-CM | POA: Diagnosis not present

## 2018-08-09 DIAGNOSIS — M5030 Other cervical disc degeneration, unspecified cervical region: Secondary | ICD-10-CM | POA: Diagnosis not present

## 2018-08-09 DIAGNOSIS — N898 Other specified noninflammatory disorders of vagina: Secondary | ICD-10-CM | POA: Diagnosis not present

## 2018-08-09 NOTE — Progress Notes (Signed)
Patient notified of sleep study results and recommendations. 

## 2018-08-09 NOTE — Telephone Encounter (Signed)
Patient notified of sleep study results and recommendations. She agrees to proceed with the recommended CPAP titration study.

## 2018-08-13 ENCOUNTER — Telehealth: Payer: Self-pay | Admitting: Internal Medicine

## 2018-08-13 NOTE — Telephone Encounter (Signed)
Encounter not needed

## 2018-08-23 DIAGNOSIS — L989 Disorder of the skin and subcutaneous tissue, unspecified: Secondary | ICD-10-CM | POA: Diagnosis not present

## 2018-08-28 ENCOUNTER — Telehealth: Payer: Self-pay | Admitting: *Deleted

## 2018-08-28 NOTE — Telephone Encounter (Signed)
Patient notified [facility] Falling denied in lab CPAP titration study. APAP order has been sent to [facility]. Once they have received insurance benefits they will contact her for set up. Patient voiced her understanding.

## 2018-08-28 NOTE — Telephone Encounter (Signed)
-----   Message from Lauralee Evener, [place] sent at 08/07/2018 11:07 AM EST ----- CPAP titiation

## 2018-09-11 ENCOUNTER — Telehealth: Payer: Self-pay

## 2018-09-11 DIAGNOSIS — G4733 Obstructive sleep apnea (adult) (pediatric): Secondary | ICD-10-CM | POA: Diagnosis not present

## 2018-09-11 NOTE — Telephone Encounter (Signed)
Left message to call back  

## 2018-09-17 ENCOUNTER — Telehealth: Payer: Self-pay | Admitting: *Deleted

## 2018-09-17 NOTE — Telephone Encounter (Signed)
   TELEPHONE CALL NOTE  This patient has been deemed a candidate for follow-up tele-health visit to limit community exposure during the Covid-19 pandemic. I spoke with the patient via phone to discuss instructions. This has been outlined on the patient's AVS (dotphrase: hcevisitinfo). The patient was advised to review the section on consent for treatment as well. The patient will receive a phone call 2-3 days prior to their E-Visit at which time consent will be verbally confirmed. A Virtual Office Visit appointment type has been scheduled for  4/12 at 9:20 am  with  acharya  - patient prefers  Telephone type.  Rulon Abide, RN 09/17/2018 4:12 PM

## 2018-09-19 ENCOUNTER — Encounter: Payer: Self-pay | Admitting: Internal Medicine

## 2018-09-24 ENCOUNTER — Telehealth: Payer: Self-pay | Admitting: Internal Medicine

## 2018-09-24 NOTE — Telephone Encounter (Signed)
lvm for pre reg  °

## 2018-09-24 NOTE — Telephone Encounter (Signed)
Smartphone/my chart/pre reg completed/virtual consent

## 2018-09-25 ENCOUNTER — Telehealth (INDEPENDENT_AMBULATORY_CARE_PROVIDER_SITE_OTHER): Payer: Medicare HMO | Admitting: Internal Medicine

## 2018-09-25 ENCOUNTER — Encounter: Payer: Self-pay | Admitting: Internal Medicine

## 2018-09-25 VITALS — BP 111/65 | HR 72 | Ht 63.0 in | Wt 208.2 lb

## 2018-09-25 DIAGNOSIS — I5033 Acute on chronic diastolic (congestive) heart failure: Secondary | ICD-10-CM | POA: Diagnosis not present

## 2018-09-25 DIAGNOSIS — I2510 Atherosclerotic heart disease of native coronary artery without angina pectoris: Secondary | ICD-10-CM

## 2018-09-25 DIAGNOSIS — Z9861 Coronary angioplasty status: Secondary | ICD-10-CM

## 2018-09-25 DIAGNOSIS — I10 Essential (primary) hypertension: Secondary | ICD-10-CM

## 2018-09-25 DIAGNOSIS — I4821 Permanent atrial fibrillation: Secondary | ICD-10-CM

## 2018-09-25 DIAGNOSIS — R0683 Snoring: Secondary | ICD-10-CM

## 2018-09-25 DIAGNOSIS — G4733 Obstructive sleep apnea (adult) (pediatric): Secondary | ICD-10-CM

## 2018-09-25 NOTE — Patient Instructions (Signed)
Medication Instructions:  Your physician recommends that you continue on your current medications as directed. Please refer to the Current Medication list given to you today.  If you need a refill on your cardiac medications before your next appointment, please call your pharmacy.   Lab work: NONE  Testing/Procedures: NONE  Follow-Up: At [facility], you and your health needs are our priority.  As part of our continuing mission to provide you with exceptional heart care, we have created designated Provider Care Teams.  These Care Teams include your primary Cardiologist (physician) and Advanced Practice Providers (APPs -  Physician Assistants and Nurse Practitioners) who all work together to provide you with the care you need, when you need it. You will need a follow up appointment in 6 months.  Please call our office 2 months in advance to schedule this appointment.  You may see Elouise Munroe, MD or one of the following Advanced Practice Providers on your designated Care Team:   Rosaria Ferries, PA-C . Jory Sims, DNP, ANP

## 2018-09-25 NOTE — Progress Notes (Signed)
Virtual Visit via Video Note   This visit type was conducted due to national recommendations for restrictions regarding the COVID-19 Pandemic (e.g. social distancing) in an effort to limit this patient's exposure and mitigate transmission in our community.  Due to her co-morbid illnesses, this patient is at least at moderate risk for complications without adequate follow up.  This format is felt to be most appropriate for this patient at this time.  All issues noted in this document were discussed and addressed.  A limited physical exam was performed with this format.  Please refer to the patient's chart for her consent to telehealth for [facility].   Evaluation Performed:  Follow-up visit  Date:  09/25/2018   ID:  Rhonda [name], Rhonda [name] 1955/09/06, MRN [id number]  Patient Location: Home Provider Location: Home  PCP:  Mardi Mainland, FNP  Cardiologist:  Elouise Munroe, MD  Electrophysiologist:  None   Chief Complaint:  F/u LE swelling, HFpEF, and CAD  History of Present Illness:    Rhonda [name] is a 63 y.o. female with a hx of HFpEF, coronary artery disease with MI at age 85 and PCI with associated systolic heart failure with recovery of ejection fraction.  She also has hypertension, atrial fibrillation on Eliquis, and lower extremity edema. She presents today for follow-up.   She is doing well and has no major concerns. The patient denies chest pain, chest pressure, dyspnea at rest or with exertion, palpitations, PND, orthopnea. Denies syncope or presyncope. Denies dizziness or lightheadedness. Feels volume even and her leg swelling has improved.  She is being evaluated for sleep apnea and will require follow up when sleep lab reopens.   The patient does not have symptoms concerning for COVID-19 infection (fever, chills, cough, or new shortness of breath).    Past Medical History:  Diagnosis Date  . Atrial fibrillation ([place])   . CAD (coronary artery  disease) 08/2009   BMS to RCA, non-obstructive in CX and LAD 2011  . CHF (congestive heart failure) ([place])   . Hypertension   . Trichomoniasis 06/05/2018   Past Surgical History:  Procedure Laterality Date  . CESAREAN SECTION     x 1. no BTL  . CORONARY ANGIOPLASTY WITH STENT PLACEMENT    . DILATION AND CURETTAGE, DIAGNOSTIC / THERAPEUTIC     for SAB     Current Meds  Medication Sig  . albuterol (PROVENTIL HFA;VENTOLIN HFA) 108 (90 Base) MCG/ACT inhaler Inhale 2 puffs into the lungs every 6 (six) hours as needed for wheezing or shortness of breath.  Marland Kitchen apixaban (ELIQUIS) 5 MG TABS tablet Take 5 mg by mouth 2 (two) times daily.  Marland Kitchen atorvastatin (LIPITOR) 20 MG tablet Take 1 tablet (20 mg total) by mouth daily.  Marland Kitchen diltiazem (CARDIZEM CD) 360 MG 24 hr capsule Take 1 capsule (360 mg total) by mouth daily.  . furosemide (LASIX) 40 MG tablet Take 1 tablet (40 mg total) by mouth 2 (two) times daily. For 2 weeks than resume 40mg  daily  . metoprolol (LOPRESSOR) 100 MG tablet Take 1 tablet (100 mg total) by mouth every 12 (twelve) hours.  Marland Kitchen spironolactone (ALDACTONE) 25 MG tablet Take 0.5 tablets (12.5 mg total) by mouth daily.     Allergies:   Ace inhibitors and Prednisone   Social History   Tobacco Use  . Smoking status: Current Every Day Smoker    Packs/day: 0.20    Types: Cigarettes  . Smokeless tobacco: Never Used  Substance Use  Topics  . Alcohol use: Yes    Alcohol/week: 2.0 standard drinks    Types: 2 Cans of beer per week    Comment: Former heavy drinker, with whiskey. Was in [place] but is not drinking beer again.,   . Drug use: Yes    Types: Marijuana     Family Hx: The patient's family history includes CAD in her maternal grandmother and mother; Diabetes Mellitus II in her father; Hypertension in her sister.  ROS:   Please see the history of present illness.     All other systems reviewed and are negative.   Prior CV studies:   The following studies were reviewed today:   No new  Labs/Other Tests and Data Reviewed:    EKG:  No ECG reviewed.  Recent Labs: 06/20/2018: B Natriuretic Peptide 458.0 06/21/2018: Hemoglobin 14.2; Platelets 176 07/17/2018: BUN 11; Creatinine, Ser 1.01; Potassium 4.2; Sodium 139   Recent Lipid Panel No results found for: CHOL, TRIG, HDL, CHOLHDL, LDLCALC, LDLDIRECT  Wt Readings from Last 3 Encounters:  09/25/18 208 lb 3.2 oz (94.4 kg)  07/31/18 205 lb (93 kg)  07/19/18 207 lb (93.9 kg)     Objective:    Vital Signs:  BP 111/65   Pulse 72   Ht 5\' 3"  (1.6 m)   Wt 208 lb 3.2 oz (94.4 kg)   BMI 36.88 kg/m    VITAL SIGNS:  reviewed GEN:  no acute distress EYES:  sclerae anicteric, EOMI - Extraocular Movements Intact RESPIRATORY:  normal respiratory effort, symmetric expansion CARDIOVASCULAR:  no peripheral edema SKIN:  no rash, lesions or ulcers. MUSCULOSKELETAL:  no obvious deformities. NEURO:  alert and oriented x 3, no obvious focal deficit PSYCH:  normal affect  ASSESSMENT & PLAN:    1. Acute on chronic diastolic CHF (congestive heart failure) ([place])   2. Essential hypertension   3. Permanent atrial fibrillation   4. Obstructive sleep apnea (adult) (pediatric)   5. Snoring   6. CAD S/P percutaneous coronary angioplasty    Heart failure symptoms are stable, and she feels euvolemic. No change to therapy today. Renal function stable on last lab evaluation.   HTN - BP is stable, no change in therapy today.  OSA - continuing evaluation with sleep lab.  COVID-19 Education: The signs and symptoms of COVID-19 were discussed with the patient and how to seek care for testing (follow up with PCP or arrange E-visit).  The importance of social distancing was discussed today.  Time:   Today, I have spent 25 minutes with the patient with telehealth technology discussing the above problems.     Medication Adjustments/Labs and Tests Ordered: Current medicines are reviewed at length with the patient today.  Concerns  regarding medicines are outlined above.   Tests Ordered: No orders of the defined types were placed in this encounter.   Medication Changes: No orders of the defined types were placed in this encounter.   Disposition:  Follow up in 6 month(s)  Signed, Elouise Munroe, MD  09/25/2018 9:25 AM    [facility]  Medication Instructions:  Your physician recommends that you continue on your current medications as directed. Please refer to the Current Medication list given to you today.  If you need a refill on your cardiac medications before your next appointment, please call your pharmacy.   Lab work: NONE  Testing/Procedures: NONE  Follow-Up: At [facility], you and your health needs are our priority.  As part of our  continuing mission to provide you with exceptional heart care, we have created designated Provider Care Teams.  These Care Teams include your primary Cardiologist (physician) and Advanced Practice Providers (APPs -  Physician Assistants and Nurse Practitioners) who all work together to provide you with the care you need, when you need it. You will need a follow up appointment in 6 months.  Please call our office 2 months in advance to schedule this appointment.  You may see Elouise Munroe, MD or one of the following Advanced Practice Providers on your designated Care Team:   Rosaria Ferries, PA-C . Jory Sims, DNP, ANP

## 2018-10-11 DIAGNOSIS — G4733 Obstructive sleep apnea (adult) (pediatric): Secondary | ICD-10-CM | POA: Diagnosis not present

## 2018-10-24 DIAGNOSIS — L089 Local infection of the skin and subcutaneous tissue, unspecified: Secondary | ICD-10-CM | POA: Diagnosis not present

## 2018-10-24 DIAGNOSIS — T3695XA Adverse effect of unspecified systemic antibiotic, initial encounter: Secondary | ICD-10-CM | POA: Diagnosis not present

## 2018-10-24 DIAGNOSIS — D489 Neoplasm of uncertain behavior, unspecified: Secondary | ICD-10-CM | POA: Diagnosis not present

## 2018-10-24 DIAGNOSIS — B379 Candidiasis, unspecified: Secondary | ICD-10-CM | POA: Diagnosis not present

## 2018-11-11 DIAGNOSIS — G4733 Obstructive sleep apnea (adult) (pediatric): Secondary | ICD-10-CM | POA: Diagnosis not present

## 2018-11-22 DIAGNOSIS — I10 Essential (primary) hypertension: Secondary | ICD-10-CM | POA: Diagnosis not present

## 2018-11-22 DIAGNOSIS — E669 Obesity, unspecified: Secondary | ICD-10-CM | POA: Diagnosis not present

## 2018-11-22 DIAGNOSIS — I4891 Unspecified atrial fibrillation: Secondary | ICD-10-CM | POA: Diagnosis not present

## 2018-11-22 DIAGNOSIS — M549 Dorsalgia, unspecified: Secondary | ICD-10-CM | POA: Diagnosis not present

## 2018-11-23 DIAGNOSIS — L089 Local infection of the skin and subcutaneous tissue, unspecified: Secondary | ICD-10-CM | POA: Diagnosis not present

## 2018-11-23 DIAGNOSIS — D485 Neoplasm of uncertain behavior of skin: Secondary | ICD-10-CM | POA: Diagnosis not present

## 2018-11-23 DIAGNOSIS — T3695XA Adverse effect of unspecified systemic antibiotic, initial encounter: Secondary | ICD-10-CM | POA: Diagnosis not present

## 2018-11-23 DIAGNOSIS — B379 Candidiasis, unspecified: Secondary | ICD-10-CM | POA: Diagnosis not present

## 2018-11-23 DIAGNOSIS — D489 Neoplasm of uncertain behavior, unspecified: Secondary | ICD-10-CM | POA: Diagnosis not present

## 2018-11-28 DIAGNOSIS — D239 Other benign neoplasm of skin, unspecified: Secondary | ICD-10-CM | POA: Diagnosis not present

## 2018-12-11 DIAGNOSIS — G4733 Obstructive sleep apnea (adult) (pediatric): Secondary | ICD-10-CM | POA: Diagnosis not present

## 2018-12-20 ENCOUNTER — Encounter: Payer: Self-pay | Admitting: Radiology

## 2019-01-11 DIAGNOSIS — G4733 Obstructive sleep apnea (adult) (pediatric): Secondary | ICD-10-CM | POA: Diagnosis not present

## 2019-01-29 ENCOUNTER — Encounter: Payer: Self-pay | Admitting: Obstetrics and Gynecology

## 2019-01-29 ENCOUNTER — Other Ambulatory Visit (HOSPITAL_COMMUNITY)
Admission: RE | Admit: 2019-01-29 | Discharge: 2019-01-29 | Disposition: A | Discharge status: Home / Self Care | Payer: Medicare HMO | Source: Ambulatory Visit | Attending: Obstetrics and Gynecology | Admitting: Obstetrics and Gynecology

## 2019-01-29 ENCOUNTER — Ambulatory Visit (INDEPENDENT_AMBULATORY_CARE_PROVIDER_SITE_OTHER): Payer: Medicare HMO | Admitting: Obstetrics and Gynecology

## 2019-01-29 ENCOUNTER — Other Ambulatory Visit: Payer: Self-pay

## 2019-01-29 VITALS — BP 123/91 | HR 134 | Wt 210.0 lb

## 2019-01-29 DIAGNOSIS — N950 Postmenopausal bleeding: Secondary | ICD-10-CM

## 2019-01-29 MED ORDER — NORETHINDRONE ACETATE 5 MG PO TABS: 10.0000 mg | ORAL_TABLET | Freq: Every day | ORAL | 2 refills | Status: DC

## 2019-01-29 NOTE — Progress Notes (Signed)
Obstetrics and Gynecology Visit Return Patient Evaluation  Appointment Date: 01/29/2019  Primary Care Provider: Brown, Nykedtra Grayson for [facility]  Chief Complaint: PMB follow up  History of Present Illness:  Rhonda [name] is a 63 y.o. with above CC. Patient seen by me in Feb 2020 for PMB follow up. She was asymptomatic at that time and plan was to rpt embx in 32m if pt still w/o s/s. About a week ago pt started having some slight BRB and cramping, s/s are stable.   Review of Systems:  as noted in the History of Present Illness.  Medications:  Marianella Raatz "[name]" had no medications administered during this visit. Current Outpatient Medications  Medication Sig Dispense Refill  . apixaban (ELIQUIS) 5 MG TABS tablet Take 5 mg by mouth 2 (two) times daily.    Marland Kitchen atorvastatin (LIPITOR) 20 MG tablet Take 1 tablet (20 mg total) by mouth daily. 30 tablet 0  . diltiazem (CARDIZEM CD) 360 MG 24 hr capsule Take 1 capsule (360 mg total) by mouth daily. 30 capsule 0  . furosemide (LASIX) 40 MG tablet Take 1 tablet (40 mg total) by mouth 2 (two) times daily. For 2 weeks than resume 40mg  daily 60 tablet 0  . metoprolol (LOPRESSOR) 100 MG tablet Take 1 tablet (100 mg total) by mouth every 12 (twelve) hours. 60 tablet 0  . spironolactone (ALDACTONE) 25 MG tablet Take 0.5 tablets (12.5 mg total) by mouth daily. 30 tablet 0  . albuterol (PROVENTIL HFA;VENTOLIN HFA) 108 (90 Base) MCG/ACT inhaler Inhale 2 puffs into the lungs every 6 (six) hours as needed for wheezing or shortness of breath. (Patient not taking: Reported on 01/29/2019) 1 Inhaler 2  . norethindrone (AYGESTIN) 5 MG tablet Take 2 tablets (10 mg total) by mouth daily. 30 tablet 2   No current facility-administered medications for this visit.     Allergies: is allergic to ace inhibitors and prednisone.  Physical Exam:  BP (!) 123/91   Pulse (!) 134   Wt 210 lb (95.3 kg)   BMI 37.20  kg/m  Body mass index is 37.2 kg/m. General appearance: Well nourished, well developed female in no acute distress.  CV: normal s1 and s2, no MRGs, HR 100s Neuro/Psych:  Normal mood and affect.    Pelvic exam:  EGBUS: normal Vaginal vault: 1-63mL of old blood in the vault, no active bleeding Cervix: nttp Uterus: nttp   Assessment: pt with recurrent PMB  Plan:  1. Postmenopausal bleeding I told her I'd recommend hysteroscopy, d&c and she is amenable to this. Will send cardiology in basket note if she needs any pre op work up.  Patient filled the aygestin Rx in the past but never started it b/c wasn't having any bleeding recommend she start at 10 qday for now.  - Cervicovaginal ancillary only( [place])   RTC: post op  Durene Romans MD Attending Center for [facility])

## 2019-01-29 NOTE — Progress Notes (Signed)
Pt states that bleeding stopped for a while and started back a week ago.  [name] l [name], CMA

## 2019-01-31 LAB — CERVICOVAGINAL ANCILLARY ONLY
Bacterial vaginitis: NEGATIVE
Candida vaginitis: NEGATIVE
Chlamydia: NEGATIVE
Neisseria Gonorrhea: NEGATIVE
Trichomonas: NEGATIVE

## 2019-02-11 DIAGNOSIS — G4733 Obstructive sleep apnea (adult) (pediatric): Secondary | ICD-10-CM | POA: Diagnosis not present

## 2019-02-21 ENCOUNTER — Telehealth: Payer: Self-pay | Admitting: *Deleted

## 2019-02-21 NOTE — Telephone Encounter (Signed)
Pt called stating she was having side effects from the Aygestin, states she was feeling like she was pregnant and feeling nauseated. Pt decided to stop medication  But then her vaginal bleeding has become worse. Pt wanted to know if her procedure could be moved up due these issues. Will discuss with Dr Ilda Basset and call pt back.

## 2019-02-25 ENCOUNTER — Telehealth: Payer: Self-pay | Admitting: *Deleted

## 2019-02-25 NOTE — Telephone Encounter (Signed)
Spoke with pt after speaking with Dr Ilda Basset, he is unable to do her surgery anytime sooner, and the medication he put her on has the least amount of side effects and that it would be up to the patient if she wanted to have side effects of meds or have the bleeding. PT verbalizes and understands and said she will "hang in there" until her surgery.

## 2019-03-07 ENCOUNTER — Encounter (HOSPITAL_BASED_OUTPATIENT_CLINIC_OR_DEPARTMENT_OTHER): Payer: Self-pay | Admitting: *Deleted

## 2019-03-07 NOTE — Progress Notes (Signed)
Dr Ilda Basset spoke to patient's cardiologist and she is to stop eliquis 3 days prior to surgery.  Will notify patient when she returns pre op phone call

## 2019-03-08 ENCOUNTER — Encounter (HOSPITAL_BASED_OUTPATIENT_CLINIC_OR_DEPARTMENT_OTHER)
Admission: RE | Admit: 2019-03-08 | Discharge: 2019-03-08 | Disposition: A | Discharge status: Home / Self Care | Payer: Medicare HMO | Source: Ambulatory Visit | Attending: Obstetrics and Gynecology | Admitting: Obstetrics and Gynecology

## 2019-03-08 DIAGNOSIS — Z01812 Encounter for preprocedural laboratory examination: Secondary | ICD-10-CM | POA: Diagnosis not present

## 2019-03-08 DIAGNOSIS — N939 Abnormal uterine and vaginal bleeding, unspecified: Secondary | ICD-10-CM | POA: Insufficient documentation

## 2019-03-08 LAB — BASIC METABOLIC PANEL
Anion gap: 19 — ABNORMAL HIGH (ref 5–15)
BUN: 8 mg/dL (ref 8–23)
CO2: 25 mmol/L (ref 22–32)
Calcium: 8.8 mg/dL — ABNORMAL LOW (ref 8.9–10.3)
Chloride: 96 mmol/L — ABNORMAL LOW (ref 98–111)
Creatinine, Ser: 1.04 mg/dL — ABNORMAL HIGH (ref 0.44–1.00)
GFR calc Af Amer: >60 mL/min (ref >60)
GFR calc non Af Amer: 58 mL/min — ABNORMAL LOW (ref >60)
Glucose, Bld: 101 mg/dL — ABNORMAL HIGH (ref 70–99)
Potassium: 3.6 mmol/L (ref 3.5–5.1)
Sodium: 140 mmol/L (ref 135–145)

## 2019-03-09 ENCOUNTER — Other Ambulatory Visit (HOSPITAL_COMMUNITY)
Admission: RE | Admit: 2019-03-09 | Discharge: 2019-03-09 | Disposition: A | Discharge status: Home / Self Care | Payer: Medicare HMO | Source: Ambulatory Visit | Attending: Obstetrics and Gynecology | Admitting: Obstetrics and Gynecology

## 2019-03-09 DIAGNOSIS — Z20828 Contact with and (suspected) exposure to other viral communicable diseases: Secondary | ICD-10-CM | POA: Diagnosis not present

## 2019-03-09 DIAGNOSIS — Z01812 Encounter for preprocedural laboratory examination: Secondary | ICD-10-CM | POA: Insufficient documentation

## 2019-03-11 ENCOUNTER — Other Ambulatory Visit: Payer: Self-pay | Admitting: Obstetrics and Gynecology

## 2019-03-11 LAB — NOVEL CORONAVIRUS, NAA (HOSP ORDER, SEND-OUT TO REF LAB; TAT 18-24 HRS): SARS-CoV-2, NAA: NOT DETECTED

## 2019-03-11 NOTE — Progress Notes (Signed)
I talked to cardiology Dr. Oval Linsey and no pre operative visit needed. Recs are to hold anti-coagulation 3 days prior to surgery and resume 24 hours post op.  Durene Romans MD Attending Center for [facility] (Faculty Practice) 03/11/2019

## 2019-03-13 ENCOUNTER — Other Ambulatory Visit: Payer: Self-pay

## 2019-03-13 ENCOUNTER — Ambulatory Visit (HOSPITAL_BASED_OUTPATIENT_CLINIC_OR_DEPARTMENT_OTHER): Payer: Medicare HMO | Admitting: Anesthesiology

## 2019-03-13 ENCOUNTER — Encounter (HOSPITAL_BASED_OUTPATIENT_CLINIC_OR_DEPARTMENT_OTHER)
Admission: RE | Disposition: A | Discharge status: Home / Self Care | Payer: Self-pay | Source: Home / Self Care | Attending: Obstetrics and Gynecology

## 2019-03-13 ENCOUNTER — Encounter (HOSPITAL_BASED_OUTPATIENT_CLINIC_OR_DEPARTMENT_OTHER): Payer: Self-pay | Admitting: *Deleted

## 2019-03-13 ENCOUNTER — Ambulatory Visit (HOSPITAL_BASED_OUTPATIENT_CLINIC_OR_DEPARTMENT_OTHER)
Admission: RE | Admit: 2019-03-13 | Discharge: 2019-03-13 | Disposition: A | Discharge status: Home / Self Care | Payer: Medicare HMO | Attending: Obstetrics and Gynecology | Admitting: Obstetrics and Gynecology

## 2019-03-13 DIAGNOSIS — G4730 Sleep apnea, unspecified: Secondary | ICD-10-CM | POA: Diagnosis not present

## 2019-03-13 DIAGNOSIS — Z955 Presence of coronary angioplasty implant and graft: Secondary | ICD-10-CM | POA: Insufficient documentation

## 2019-03-13 DIAGNOSIS — N840 Polyp of corpus uteri: Secondary | ICD-10-CM | POA: Insufficient documentation

## 2019-03-13 DIAGNOSIS — N950 Postmenopausal bleeding: Secondary | ICD-10-CM | POA: Diagnosis not present

## 2019-03-13 DIAGNOSIS — Z7901 Long term (current) use of anticoagulants: Secondary | ICD-10-CM | POA: Insufficient documentation

## 2019-03-13 DIAGNOSIS — G4733 Obstructive sleep apnea (adult) (pediatric): Secondary | ICD-10-CM | POA: Diagnosis not present

## 2019-03-13 DIAGNOSIS — I4891 Unspecified atrial fibrillation: Secondary | ICD-10-CM | POA: Insufficient documentation

## 2019-03-13 DIAGNOSIS — R69 Illness, unspecified: Secondary | ICD-10-CM | POA: Diagnosis not present

## 2019-03-13 DIAGNOSIS — Z6837 Body mass index (BMI) 37.0-37.9, adult: Secondary | ICD-10-CM | POA: Diagnosis not present

## 2019-03-13 DIAGNOSIS — I110 Hypertensive heart disease with heart failure: Secondary | ICD-10-CM | POA: Diagnosis not present

## 2019-03-13 DIAGNOSIS — I5033 Acute on chronic diastolic (congestive) heart failure: Secondary | ICD-10-CM | POA: Diagnosis not present

## 2019-03-13 DIAGNOSIS — I509 Heart failure, unspecified: Secondary | ICD-10-CM | POA: Diagnosis not present

## 2019-03-13 DIAGNOSIS — E669 Obesity, unspecified: Secondary | ICD-10-CM | POA: Insufficient documentation

## 2019-03-13 DIAGNOSIS — Z888 Allergy status to other drugs, medicaments and biological substances status: Secondary | ICD-10-CM | POA: Diagnosis not present

## 2019-03-13 DIAGNOSIS — Z9889 Other specified postprocedural states: Secondary | ICD-10-CM

## 2019-03-13 DIAGNOSIS — F17210 Nicotine dependence, cigarettes, uncomplicated: Secondary | ICD-10-CM | POA: Insufficient documentation

## 2019-03-13 DIAGNOSIS — Z79899 Other long term (current) drug therapy: Secondary | ICD-10-CM | POA: Insufficient documentation

## 2019-03-13 DIAGNOSIS — I2510 Atherosclerotic heart disease of native coronary artery without angina pectoris: Secondary | ICD-10-CM | POA: Insufficient documentation

## 2019-03-13 HISTORY — DX: Sleep apnea, unspecified: G47.30

## 2019-03-13 HISTORY — DX: Edema, unspecified: R60.9

## 2019-03-13 HISTORY — PX: HYSTEROSCOPY WITH D & C: SHX1775

## 2019-03-13 HISTORY — DX: Localized edema: R60.0

## 2019-03-13 HISTORY — DX: Nausea: R11.0

## 2019-03-13 HISTORY — DX: Pneumonia, unspecified organism: J18.9

## 2019-03-13 SURGERY — DILATATION AND CURETTAGE /HYSTEROSCOPY
Anesthesia: General | Site: Vagina

## 2019-03-13 MED ORDER — KETOROLAC TROMETHAMINE 30 MG/ML IJ SOLN: 30.0000 mg | Freq: Once | INTRAMUSCULAR | Status: DC | PRN

## 2019-03-13 MED ORDER — FENTANYL CITRATE (PF) 100 MCG/2ML IJ SOLN
INTRAMUSCULAR | Status: AC
  Filled 2019-03-13: qty 2

## 2019-03-13 MED ORDER — OXYCODONE HCL 5 MG/5ML PO SOLN: 5.0000 mg | Freq: Once | ORAL | Status: DC | PRN

## 2019-03-13 MED ORDER — LIDOCAINE 2% (20 MG/ML) 5 ML SYRINGE
INTRAMUSCULAR | Status: AC
  Filled 2019-03-13: qty 5

## 2019-03-13 MED ORDER — PROPOFOL 10 MG/ML IV BOLUS
INTRAVENOUS | Status: DC | PRN
  Administered 2019-03-13: 150 mg via INTRAVENOUS

## 2019-03-13 MED ORDER — APIXABAN 5 MG PO TABS: 5.0000 mg | ORAL_TABLET | Freq: Two times a day (BID) | ORAL | Status: DC

## 2019-03-13 MED ORDER — ONDANSETRON HCL 4 MG/2ML IJ SOLN
INTRAMUSCULAR | Status: DC | PRN
  Administered 2019-03-13: 4 mg via INTRAVENOUS

## 2019-03-13 MED ORDER — PROPOFOL 10 MG/ML IV BOLUS
INTRAVENOUS | Status: AC
  Filled 2019-03-13: qty 20

## 2019-03-13 MED ORDER — SILVER NITRATE-POT NITRATE 75-25 % EX MISC
CUTANEOUS | Status: DC | PRN
  Administered 2019-03-13: 3

## 2019-03-13 MED ORDER — SODIUM CHLORIDE (PF) 0.9 % IJ SOLN
INTRAMUSCULAR | Status: AC
  Filled 2019-03-13: qty 20

## 2019-03-13 MED ORDER — OXYCODONE HCL 5 MG PO TABS: 5.0000 mg | ORAL_TABLET | Freq: Once | ORAL | Status: DC | PRN

## 2019-03-13 MED ORDER — MIDAZOLAM HCL 2 MG/2ML IJ SOLN
INTRAMUSCULAR | Status: AC
  Filled 2019-03-13: qty 2

## 2019-03-13 MED ORDER — HYDROMORPHONE HCL 1 MG/ML IJ SOLN: 0.2500 mg | INTRAMUSCULAR | Status: DC | PRN

## 2019-03-13 MED ORDER — DEXAMETHASONE SODIUM PHOSPHATE 10 MG/ML IJ SOLN
INTRAMUSCULAR | Status: DC | PRN
  Administered 2019-03-13: 5 mg via INTRAVENOUS

## 2019-03-13 MED ORDER — SODIUM CHLORIDE (PF) 0.9 % IJ SOLN
INTRAMUSCULAR | Status: DC | PRN
  Administered 2019-03-13: 20 mL

## 2019-03-13 MED ORDER — LIDOCAINE HCL (PF) 1 % IJ SOLN
INTRAMUSCULAR | Status: AC
  Filled 2019-03-13: qty 30

## 2019-03-13 MED ORDER — ONDANSETRON HCL 4 MG/2ML IJ SOLN
INTRAMUSCULAR | Status: AC
  Filled 2019-03-13: qty 2

## 2019-03-13 MED ORDER — LACTATED RINGERS IV SOLN: INTRAVENOUS | Status: DC

## 2019-03-13 MED ORDER — OXYCODONE-ACETAMINOPHEN 5-325 MG PO TABS: 1.0000 | ORAL_TABLET | Freq: Four times a day (QID) | ORAL | 0 refills | Status: DC | PRN

## 2019-03-13 MED ORDER — POLYETHYLENE GLYCOL 3350 17 G PO PACK: 17.0000 g | PACK | Freq: Every day | ORAL | 0 refills | Status: DC

## 2019-03-13 MED ORDER — SUCCINYLCHOLINE CHLORIDE 200 MG/10ML IV SOSY
PREFILLED_SYRINGE | INTRAVENOUS | Status: AC
  Filled 2019-03-13: qty 10

## 2019-03-13 MED ORDER — FENTANYL CITRATE (PF) 250 MCG/5ML IJ SOLN
INTRAMUSCULAR | Status: DC | PRN
  Administered 2019-03-13 (×2): 25 ug via INTRAVENOUS
  Administered 2019-03-13: 50 ug via INTRAVENOUS

## 2019-03-13 MED ORDER — MEPERIDINE HCL 25 MG/ML IJ SOLN: 6.2500 mg | INTRAMUSCULAR | Status: DC | PRN

## 2019-03-13 MED ORDER — LIDOCAINE 2% (20 MG/ML) 5 ML SYRINGE
INTRAMUSCULAR | Status: DC | PRN
  Administered 2019-03-13: 60 mg via INTRAVENOUS

## 2019-03-13 MED ORDER — LACTATED RINGERS IV SOLN
INTRAVENOUS | Status: DC
  Administered 2019-03-13: 09:00:00 via INTRAVENOUS

## 2019-03-13 MED ORDER — SCOPOLAMINE 1 MG/3DAYS TD PT72: 1.0000 | MEDICATED_PATCH | TRANSDERMAL | Status: DC

## 2019-03-13 MED ORDER — DEXAMETHASONE SODIUM PHOSPHATE 10 MG/ML IJ SOLN
INTRAMUSCULAR | Status: AC
  Filled 2019-03-13: qty 1

## 2019-03-13 MED ORDER — PROMETHAZINE HCL 25 MG/ML IJ SOLN: 6.2500 mg | INTRAMUSCULAR | Status: DC | PRN

## 2019-03-13 MED ORDER — MIDAZOLAM HCL 5 MG/5ML IJ SOLN
INTRAMUSCULAR | Status: DC | PRN
  Administered 2019-03-13: 2 mg via INTRAVENOUS

## 2019-03-13 SURGICAL SUPPLY — 17 items
BRIEF STRETCH FOR OB PAD XXL (UNDERPADS AND DIAPERS) ×2 IMPLANT
CANISTER SUCT 3000ML PPV (MISCELLANEOUS) ×2 IMPLANT
CATH ROBINSON RED A/P 16FR (CATHETERS) ×2 IMPLANT
GAUZE 4X4 16PLY RFD (DISPOSABLE) ×2 IMPLANT
GLOVE BIOGEL PI IND STRL 7.0 (GLOVE) ×1 IMPLANT
GLOVE BIOGEL PI IND STRL 7.5 (GLOVE) ×1 IMPLANT
GLOVE BIOGEL PI INDICATOR 7.0 (GLOVE) ×1
GLOVE BIOGEL PI INDICATOR 7.5 (GLOVE) ×1
GLOVE SURG SS PI 7.0 STRL IVOR (GLOVE) ×2 IMPLANT
GOWN STRL REUS W/TWL LRG LVL3 (GOWN DISPOSABLE) ×2 IMPLANT
GOWN STRL REUS W/TWL XL LVL3 (GOWN DISPOSABLE) ×2 IMPLANT
KIT PROCEDURE FLUENT (KITS) ×2 IMPLANT
PACK VAGINAL MINOR WOMEN LF (CUSTOM PROCEDURE TRAY) ×2 IMPLANT
PAD OB MATERNITY 4.3X12.25 (PERSONAL CARE ITEMS) ×2 IMPLANT
PAD PREP 24X48 CUFFED NSTRL (MISCELLANEOUS) ×2 IMPLANT
SLEEVE SCD COMPRESS KNEE MED (MISCELLANEOUS) ×4 IMPLANT
TOWEL GREEN STERILE FF (TOWEL DISPOSABLE) ×4 IMPLANT

## 2019-03-13 NOTE — Anesthesia Preprocedure Evaluation (Addendum)
Anesthesia Evaluation  Patient identified by MRN, date of birth, ID band Patient awake    Reviewed: Allergy & Precautions, NPO status , Patient's Chart, lab work & pertinent test results, reviewed documented beta blocker date and time   Airway Mallampati: II  TM Distance: >3 FB Neck ROM: Full    Dental  (+) Missing, Loose, Poor Dentition, Chipped, Dental Advisory Given,    Pulmonary sleep apnea and Continuous Positive Airway Pressure Ventilation , pneumonia, resolved, Current Smoker and Patient abstained from smoking.,  PNA 06/2018, no residual issues  2 cigarettes per day   Pulmonary exam normal breath sounds clear to auscultation       Cardiovascular hypertension, Pt. on medications and Pt. on home beta blockers + CAD, + Cardiac Stents and +CHF  Normal cardiovascular exam+ dysrhythmias Atrial Fibrillation  Rhythm:Regular Rate:Normal  BMS to RCA, non-obstructive in CX and LAD 2011  Last echo 03/2018: - Left ventricle: The cavity size was normal. Wall thickness was   normal. Systolic function was normal. The estimated ejection   fraction was in the range of 50% to 55%. Wall motion was normal;   there were no regional wall motion abnormalities. - Mitral valve: There was mild regurgitation. - Left atrium: The atrium was severely dilated. - Right atrium: The atrium was moderately dilated.   Neuro/Psych negative neurological ROS  negative psych ROS   GI/Hepatic (+)     substance abuse  marijuana use, Has had increased nausea recently d/t hormonal changes; not currently nauseous   Endo/Other  Obesity BMI 36  Renal/GU negative Renal ROS  negative genitourinary   Musculoskeletal negative musculoskeletal ROS (+)   Abdominal Normal abdominal exam  (+) + obese,   Peds negative pediatric ROS (+)  Hematology negative hematology ROS (+)   Anesthesia Other Findings   Reproductive/Obstetrics negative OB ROS                             Anesthesia Physical Anesthesia Plan  ASA: III  Anesthesia Plan: General   Post-op Pain Management:    Induction: Intravenous  PONV Risk Score and Plan: 3 and Ondansetron, Dexamethasone, Treatment may vary due to age or medical condition and Midazolam  Airway Management Planned: LMA  Additional Equipment: None  Intra-op Plan:   Post-operative Plan: Extubation in OR  Informed Consent: I have reviewed the patients History and Physical, chart, labs and discussed the procedure including the risks, benefits and alternatives for the proposed anesthesia with the patient or authorized representative who has indicated his/her understanding and acceptance.     Dental advisory given  Plan Discussed with: CRNA  Anesthesia Plan Comments:         Anesthesia Quick Evaluation

## 2019-03-13 NOTE — H&P (Signed)
Obstetrics & Gynecology Surgical H&P   Date of Surgery: 03/13/2019   Primary OBGYN: [facility] for [facility] Primary Care Provider: Mardi Mainland  Reason for Admission: scheduled hysteroscpy, d&c for persistent postmenopausal bleeding  History of Present Illness: Ms. Rhonda [name] is a 63 y.o. [phone] (No LMP recorded. Patient is postmenopausal.), with the above CC. PMHx is significant for HTN, CHF, h/o A-fib, CAD, c-section.  Patient reports slight VB but nothing heavy    ROS: A 12-point review of systems was performed and negative, except as stated in the above HPI.  OBGYN History: As per HPI. OB History  Gravida Para Term Preterm AB Living  4 3 3   1 3   SAB TAB Ectopic Multiple Live Births  1       3    # Outcome Date GA Lbr Len/2nd Weight Sex Delivery Anes PTL Lv  4 SAB           3 Term           2 Term           1 Term             Obstetric Comments  c-section x 1. D&C x 1     Past Medical History: Past Medical History:  Diagnosis Date  . Atrial fibrillation ([place])   . CAD (coronary artery disease) 08/2009   BMS to RCA, non-obstructive in CX and LAD 2011  . CHF (congestive heart failure) ([place])   . Edema, peripheral   . Hypertension   . Nausea   . Pneumonia    January 2020  . Sleep apnea   . Trichomoniasis 06/05/2018    Past Surgical History: Past Surgical History:  Procedure Laterality Date  . CESAREAN SECTION     x 1. no BTL  . CORONARY ANGIOPLASTY WITH STENT PLACEMENT    . DILATION AND CURETTAGE, DIAGNOSTIC / THERAPEUTIC     for SAB    Family History:  Family History  Problem Relation Age of Onset  . CAD Mother   . Diabetes Mellitus II Father   . Hypertension Sister   . CAD Maternal Grandmother     Social History:  Social History   Socioeconomic History  . Marital status: Divorced    Spouse name: Not on file  . Number of children: Not on file  . Years of education: Not on file  . Highest education level: Not on  file  Occupational History  . Not on file  Social Needs  . Financial resource strain: Not on file  . Food insecurity    Worry: Not on file    Inability: Not on file  . Transportation needs    Medical: Not on file    Non-medical: Not on file  Tobacco Use  . Smoking status: Current Every Day Smoker    Packs/day: 0.20    Types: Cigarettes  . Smokeless tobacco: Never Used  Substance and Sexual Activity  . Alcohol use: Yes    Alcohol/week: 2.0 standard drinks    Types: 2 Cans of beer per week    Comment: "drink at least 3 shots a day"  . Drug use: Yes    Types: Marijuana    Comment: last time last night  . Sexual activity: Yes    Birth control/protection: Post-menopausal  Lifestyle  . Physical activity    Days per week: Not on file    Minutes per session: Not on file  . Stress: Not on  file  Relationships  . Social Herbalist on phone: Not on file    Gets together: Not on file    Attends religious service: Not on file    Active member of club or organization: Not on file    Attends meetings of clubs or organizations: Not on file    Relationship status: Not on file  . Intimate partner violence    Fear of current or ex partner: Not on file    Emotionally abused: Not on file    Physically abused: Not on file    Forced sexual activity: Not on file  Other Topics Concern  . Not on file  Social History Narrative  . Not on file     Allergy: Allergies  Allergen Reactions  . Ace Inhibitors Cough  . Prednisone Itching and Swelling    Itching on leg and arms swelling from knee to ankles and felt restless    Current Outpatient Medications: Medications Prior to Admission  Medication Sig Dispense Refill Last Dose  . albuterol (PROVENTIL HFA;VENTOLIN HFA) 108 (90 Base) MCG/ACT inhaler Inhale 2 puffs into the lungs every 6 (six) hours as needed for wheezing or shortness of breath. 1 Inhaler 2 Past Month at Unknown time  . apixaban (ELIQUIS) 5 MG TABS tablet Take 5 mg  by mouth 2 (two) times daily.   Past Week at Unknown time  . atorvastatin (LIPITOR) 20 MG tablet Take 1 tablet (20 mg total) by mouth daily. 30 tablet 0 03/12/2019 at Unknown time  . diltiazem (CARDIZEM CD) 360 MG 24 hr capsule Take 1 capsule (360 mg total) by mouth daily. 30 capsule 0 03/13/2019 at 0730  . furosemide (LASIX) 40 MG tablet Take 1 tablet (40 mg total) by mouth 2 (two) times daily. For 2 weeks than resume 40mg  daily 60 tablet 0 03/12/2019 at Unknown time  . metoprolol (LOPRESSOR) 100 MG tablet Take 1 tablet (100 mg total) by mouth every 12 (twelve) hours. 60 tablet 0 03/13/2019 at 0730  . spironolactone (ALDACTONE) 25 MG tablet Take 0.5 tablets (12.5 mg total) by mouth daily. 30 tablet 0 03/12/2019 at Unknown time  . norethindrone (AYGESTIN) 5 MG tablet Take 2 tablets (10 mg total) by mouth daily. 30 tablet 2 More than a month at Unknown time     Hospital Medications: Current Facility-Administered Medications  Medication Dose Route Frequency Provider Last Rate Last Dose  . lactated ringers infusion   Intravenous Continuous Hulan Fray L, MD 10 mL/hr at 03/13/19 0917    . lactated ringers infusion   Intravenous Continuous Aletha Halim, MD         Physical Exam:  Current Vital Signs 24h Vital Sign Ranges  T 98.7 F (37.1 C) Temp  Avg: 98.7 F (37.1 C)  Min: 98.7 F (37.1 C)  Max: 98.7 F (37.1 C)  BP 107/70 BP  Min: 107/70  Max: 107/70  HR (!) 116 Pulse  Avg: 116  Min: 116  Max: 116  RR 20 Resp  Avg: 20  Min: 20  Max: 20  SaO2 97 % Room Air SpO2  Avg: 97 %  Min: 97 %  Max: 97 %       24 Hour I/O Current Shift I/O  Time Ins Outs No intake/output data recorded. No intake/output data recorded.   Body mass index is 37.14 kg/m. General appearance: Well nourished, well developed female in no acute distress.  Cardiovascular: S1, S2 normal, no murmur, rub or gallop, regular rate  and rhythm Respiratory:  Clear to auscultation bilateral. Normal respiratory  effort Abdomen: positive bowel sounds and no masses, hernias; diffusely non tender to palpation, non distended Neuro/Psych:  Normal mood and affect.  Skin:  Warm and dry.  Extremities: no clubbing, cyanosis, or edema.   From 05/2018 The patient was placed in the dorsal lithotomy position.  Bimanual exam showed the uterus to be in the neutral position.  A Graves' speculum inserted in the vagina, and the cervix was visualized and a pap smear performed. The cervix was then prepped with povidone iodine, and a sharp tenaculum was applied to the anterior lip of the cervix for stabilization.  A pipelle was inserted into the uterine cavity and sounded the uterus to a depth of 7.5cm.  A Small amount of tissue was collected after 3 passes. The sample was sent for pathologic examination.   Laboratory: COVID: negative  Recent Labs  Lab 03/08/19 1230  NA 140  K 3.6  CL 96*  CO2 25  BUN 8  CREATININE 1.04*  CALCIUM 8.8*  GLUCOSE 101*    Imaging:  CLINICAL DATA:  Vaginal bleeding  EXAM: TRANSABDOMINAL AND TRANSVAGINAL ULTRASOUND OF PELVIS  TECHNIQUE: Both transabdominal and transvaginal ultrasound examinations of the pelvis were performed. Transabdominal technique was performed for global imaging of the pelvis including uterus, ovaries, adnexal regions, and pelvic cul-de-sac. It was necessary to proceed with endovaginal exam following the transabdominal exam to visualize the endometrium and bilateral ovaries.  COMPARISON:  None  FINDINGS: Uterus  Measurements: 9.5 x 4.6 x 5.1 cm = volume: 115.8 mL. No fibroids or other mass visualized.  Endometrium  Thickness: 11.5 mm, abnormally thickened for patient age. The endometrium is coarse with mild heterogeneous echotexture.  Right ovary  Not visualized.  Left ovary  Not visualized.  Other findings  No abnormal free fluid.  IMPRESSION: Abnormal thickened endometrium for patient's age. The uterus  is normal.  Bilateral ovaries are not visualized.   Electronically Signed   By: Abelardo Diesel M.D.   On: 06/22/2018 14:42  Assessment: Ms. Dailany Moccia is a 63 y.o. [phone] here for scheduled surgery; doing well  Plan: D/w her re: hysteroscopy, d&c and she amenable with proceeding. D/w her to restart anti-coagulation in 24 hours.    Durene Romans MD Attending Center for [facility])

## 2019-03-13 NOTE — Op Note (Signed)
Operative Note   03/13/2019  PRE-OP DIAGNOSIS: Persistent postmenpausal bleeding   POST-OP DIAGNOSIS: Same. Endometrial polyps   SURGEON: Surgeon(s) and Role:    * Janaiah Vetrano, Eduard Clos, MD - Primary  ASSISTANT: None  PROCEDURE: DILATATION AND CURETTAGE /HYSTEROSCOPY AND POLYPECTOMY  ANESTHESIA: General and paracervical block  ESTIMATED BLOOD LOSS: 12mL  DRAINS: per anesthesia note   TOTAL IV FLUIDS: 470mL crystalloid  SPECIMENS: endometrial curettings and polyps  VTE PROPHYLAXIS: SCDs to the bilateral lower extremities  ANTIBIOTICS: not indicated  FLUID DEFICIT: [id number]  COMPLICATIONS: None  DISPOSITION: PACU - hemodynamically stable.  CONDITION: stable  FINDINGS: Exam under anesthesia revealed normal EGBUS, vagina and cervix with small blood clot at the Os. Uterus was small, mobile mid plane uterus with no masses and bilateral adnexa without masses or fullness. She sounded to 8.5cm.  Hysteroscopy revealed a several benign appearing endometrial polyps that were decently sized coming from the fundal area. Otherwise, grossly normal appearing uterine cavity, atrophic with bilateral tubal ostia and normal appearing endocervical canal. Hysteroscopy at the end of the procedure showed a clean uterine cavity and no perforations.   PROCEDURE IN DETAIL:  After informed consent was obtained, the patient was taken to the operating room where anesthesia was obtained without difficulty. The patient was positioned in the dorsal lithotomy position in [place].  The patient's bladder was catheterized with an in and out foley catheter.  The patient was examined under anesthesia, with the above noted findings.  The bi-valved speculum was placed inside the patient's vagina, and the the anterior lip of the cervix was seen and grasped with the tenaculum.  A paracervical block was achieved with 4mL normal saline.  The uterine cavity was sounded to 8.5cm, and then the cervix was easily dilated to 17  French-Pratt dilator.  The hysteroscope was introduced, with the above noted findings. The hystersocope was removed and the uterine cavity was curetted until a gritty texture was noted, yielding endometrial curettings; in addition to this, polyp forceps were placed through operative scope to aide in removal.  Excellent hemostasis was noted, and all instruments were removed, with excellent hemostasis noted throughout.  She was then taken out of dorsal lithotomy. The patient tolerated the procedure well.  Sponge, lap and instrument counts were correct x2.  The patient was taken to recovery room in excellent condition.  Durene Romans MD Attending Center for [employer])

## 2019-03-13 NOTE — Transfer of Care (Signed)
Immediate Anesthesia Transfer of Care Note  Patient: Rhonda [name]  Procedure(s) Performed: DILATATION AND CURETTAGE /HYSTEROSCOPY (N/A Vagina )  Patient Location: PACU  Anesthesia Type:General  Level of Consciousness: awake, alert , oriented and patient cooperative  Airway & Oxygen Therapy: Patient Spontanous Breathing and Patient connected to face mask oxygen  Post-op Assessment: Report given to RN, Post -op Vital signs reviewed and stable and Patient moving all extremities  Post vital signs: Reviewed and stable  Last Vitals:  Vitals Value Taken Time  BP 111/95 03/13/19 1039  Temp    Pulse 129 03/13/19 1041  Resp 22 03/13/19 1041  SpO2 100 % 03/13/19 1041  Vitals shown include unvalidated device data.  Last Pain:  Vitals:   03/13/19 0904  TempSrc: Oral  PainSc: 0-No pain         Complications: No apparent anesthesia complications

## 2019-03-13 NOTE — Anesthesia Postprocedure Evaluation (Signed)
Anesthesia Post Note  Patient: Rhonda [name]  Procedure(s) Performed: DILATATION AND CURETTAGE /HYSTEROSCOPY (N/A Vagina )     Patient location during evaluation: PACU Anesthesia Type: General Level of consciousness: awake and alert, oriented and patient cooperative Pain management: pain level controlled Vital Signs Assessment: post-procedure vital signs reviewed and stable Respiratory status: spontaneous breathing, nonlabored ventilation and respiratory function stable Cardiovascular status: blood pressure returned to baseline and stable Postop Assessment: no apparent nausea or vomiting Anesthetic complications: no    Last Vitals:  Vitals:   03/13/19 1130 03/13/19 1145  BP: 98/72 106/82  Pulse: (!) 40 61  Resp: (!) 26 (!) 21  Temp:  37.1 C  SpO2: 95%     Last Pain:  Vitals:   03/13/19 1145  TempSrc:   PainSc: 0-No pain                 Jarome Matin Theon Sobotka

## 2019-03-13 NOTE — Discharge Instructions (Addendum)
Hysteroscopy, Care After °This sheet gives you information about how to care for yourself after your procedure. Your health care provider may also give you more specific instructions. If you have problems or questions, contact your health care provider. °What can I expect after the procedure? °After the procedure, it is common to have: °· Cramping. °· Bleeding. This can vary from light spotting to menstrual-like bleeding. °Follow these instructions at home: °Activity °· Rest for 1-2 days after the procedure. °· Do not douche, use tampons, or have sex for 2 weeks after the procedure, or until your health care provider approves. °· Do not drive for 24 hours after the procedure, or for as long as told by your health care provider. °· Do not drive, use heavy machinery, or drink alcohol while taking prescription pain medicines. °Medicines ° °· Take over-the-counter and prescription medicines only as told by your health care provider. °· Do not take aspirin during recovery. It can increase the risk of bleeding. °General instructions °· Do not take baths, swim, or use a hot tub until your health care provider approves. Take showers instead of baths for 2 weeks, or for as long as told by your health care provider. °· To prevent or treat constipation while you are taking prescription pain medicine, your health care provider may recommend that you: °? Drink enough fluid to keep your urine clear or pale yellow. °? Take over-the-counter or prescription medicines. °? Eat foods that are high in fiber, such as fresh fruits and vegetables, whole grains, and beans. °? Limit foods that are high in fat and processed sugars, such as fried and sweet foods. °· Keep all follow-up visits as told by your health care provider. This is important. °Contact a health care provider if: °· You feel dizzy or lightheaded. °· You feel nauseous. °· You have abnormal vaginal discharge. °· You have a rash. °· You have pain that does not get better with  medicine. °· You have chills. °Get help right away if: °· You have bleeding that is heavier than a normal menstrual period. °· You have a fever. °· You have pain or cramps that get worse. °· You develop new abdominal pain. °· You faint. °· You have pain in your shoulders. °· You have shortness of breath. °Summary °· After the procedure, you may have cramping and some vaginal bleeding. °· Do not douche, use tampons, or have sex for 2 weeks after the procedure, or until your health care provider approves. °· Do not take baths, swim, or use a hot tub until your health care provider approves. Take showers instead of baths for 2 weeks, or for as long as told by your health care provider. °· Report any unusual symptoms to your health care provider. °· Keep all follow-up visits as told by your health care provider. This is important. °This information is not intended to replace advice given to you by your health care provider. Make sure you discuss any questions you have with your health care provider. °Document Released: 03/13/2013 Document Revised: 05/05/2017 Document Reviewed: 06/21/2016 °Elsevier Patient Education © 2020 [employer] ° ° °Post Anesthesia Home Care Instructions ° °Activity: °Get plenty of rest for the remainder of the day. A responsible individual must stay with you for 24 hours following the procedure.  °For the next 24 hours, DO NOT: °-Drive a car °-Operate machinery °-Drink alcoholic beverages °-Take any medication unless instructed by your physician °-Make any legal decisions or sign important papers. ° °Meals: °Start   with liquid foods such as gelatin or soup. Progress to regular foods as tolerated. Avoid greasy, spicy, heavy foods. If nausea and/or vomiting occur, drink only clear liquids until the nausea and/or vomiting subsides. Call your physician if vomiting continues. ° °Special Instructions/Symptoms: °Your throat may feel dry or sore from the anesthesia or the breathing tube placed in your  throat during surgery. If this causes discomfort, gargle with warm salt water. The discomfort should disappear within 24 hours. ° °If you had a scopolamine patch placed behind your ear for the management of post- operative nausea and/or vomiting: ° °1. The medication in the patch is effective for 72 hours, after which it should be removed.  Wrap patch in a tissue and discard in the trash. Wash hands thoroughly with soap and water. °2. You may remove the patch earlier than 72 hours if you experience unpleasant side effects which may include dry mouth, dizziness or visual disturbances. °3. Avoid touching the patch. Wash your hands with soap and water after contact with the patch. °

## 2019-03-13 NOTE — Anesthesia Procedure Notes (Signed)
Procedure Name: LMA Insertion Date/Time: 03/13/2019 9:58 AM Performed by: Myna Bright, CRNA Pre-anesthesia Checklist: Patient identified, Emergency Drugs available, Suction available and Patient being monitored Patient Re-evaluated:Patient Re-evaluated prior to induction Oxygen Delivery Method: Circle system utilized Preoxygenation: Pre-oxygenation with 100% oxygen Induction Type: IV induction Ventilation: Mask ventilation without difficulty LMA: LMA inserted LMA Size: 4.0 Tube type: Oral Number of attempts: 1 Placement Confirmation: breath sounds checked- equal and bilateral Tube secured with: Tape Dental Injury: Teeth and Oropharynx as per pre-operative assessment

## 2019-03-14 ENCOUNTER — Encounter (HOSPITAL_BASED_OUTPATIENT_CLINIC_OR_DEPARTMENT_OTHER): Payer: Self-pay | Admitting: Obstetrics and Gynecology

## 2019-03-15 LAB — SURGICAL PATHOLOGY

## 2019-04-04 ENCOUNTER — Ambulatory Visit (INDEPENDENT_AMBULATORY_CARE_PROVIDER_SITE_OTHER): Payer: Medicare HMO | Admitting: Obstetrics and Gynecology

## 2019-04-04 ENCOUNTER — Other Ambulatory Visit: Payer: Self-pay

## 2019-04-04 ENCOUNTER — Encounter: Payer: Self-pay | Admitting: Obstetrics and Gynecology

## 2019-04-04 VITALS — BP 121/87 | HR 72 | Wt 203.0 lb

## 2019-04-04 DIAGNOSIS — Z9889 Other specified postprocedural states: Secondary | ICD-10-CM

## 2019-04-04 DIAGNOSIS — Z8742 Personal history of other diseases of the female genital tract: Secondary | ICD-10-CM

## 2019-04-04 DIAGNOSIS — Z23 Encounter for immunization: Secondary | ICD-10-CM

## 2019-04-04 DIAGNOSIS — Z09 Encounter for follow-up examination after completed treatment for conditions other than malignant neoplasm: Secondary | ICD-10-CM

## 2019-04-04 NOTE — Progress Notes (Signed)
[facility] [place] 04/04/2019  CC: regular post op visit  Subjective:     Rhonda [name] is a 63 y.o. s/p 10/7 hysteroscopy, d&c, polypectomy for postmenopausal bleeding. She was discharged to home from the pacu and final pathology was benign.  No bleeding, issues since surgery.   Review of Systems Pertinent items are noted in HPI.    Objective:    BP 121/87   Pulse 72   Wt 203 lb (92.1 kg)   BMI 35.96 kg/m  General:  alert     Assessment:    Doing well postoperatively. Operative findings again reviewed. Pathology report discussed.    Plan:   Routine care  RTC PRN  Durene Romans MD Attending [facility] [place] [facility] 04/04/2019 Time: [phone]

## 2019-04-13 DIAGNOSIS — G4733 Obstructive sleep apnea (adult) (pediatric): Secondary | ICD-10-CM | POA: Diagnosis not present

## 2019-05-13 DIAGNOSIS — G4733 Obstructive sleep apnea (adult) (pediatric): Secondary | ICD-10-CM | POA: Diagnosis not present

## 2019-05-16 ENCOUNTER — Ambulatory Visit: Payer: Medicare HMO | Admitting: Internal Medicine

## 2019-05-16 ENCOUNTER — Other Ambulatory Visit: Payer: Self-pay

## 2019-05-16 ENCOUNTER — Encounter: Payer: Self-pay | Admitting: Internal Medicine

## 2019-05-16 VITALS — BP 120/80 | HR 126 | Temp 97.2°F | Ht 63.0 in | Wt 209.8 lb

## 2019-05-16 DIAGNOSIS — G4733 Obstructive sleep apnea (adult) (pediatric): Secondary | ICD-10-CM

## 2019-05-16 DIAGNOSIS — I484 Atypical atrial flutter: Secondary | ICD-10-CM | POA: Diagnosis not present

## 2019-05-16 DIAGNOSIS — I5033 Acute on chronic diastolic (congestive) heart failure: Secondary | ICD-10-CM

## 2019-05-16 DIAGNOSIS — I10 Essential (primary) hypertension: Secondary | ICD-10-CM

## 2019-05-16 DIAGNOSIS — I2510 Atherosclerotic heart disease of native coronary artery without angina pectoris: Secondary | ICD-10-CM

## 2019-05-16 DIAGNOSIS — I4821 Permanent atrial fibrillation: Secondary | ICD-10-CM

## 2019-05-16 DIAGNOSIS — Z9861 Coronary angioplasty status: Secondary | ICD-10-CM

## 2019-05-16 DIAGNOSIS — R0683 Snoring: Secondary | ICD-10-CM | POA: Diagnosis not present

## 2019-05-16 MED ORDER — METOPROLOL TARTRATE 100 MG PO TABS: 150.0000 mg | ORAL_TABLET | Freq: Two times a day (BID) | ORAL | 3 refills | Status: DC

## 2019-05-16 NOTE — Progress Notes (Signed)
2 previous failed cardioversion.  Severe sleep apnea   Cardiology Office Note:    Date:  05/16/2019   ID:  Esraa, Brautigam 04-29-1956, MRN [id number]  PCP:  Mardi Mainland, FNP  Cardiologist:  Elouise Munroe, MD  Electrophysiologist:  None   Referring MD: Mardi Mainland,*   Chief Complaint: f/u afib and HFPEF  History of Present Illness:    Rhonda [name] is a 63 y.o. female with a hx of HFpEF, coronary artery disease with MI at age 47 and PCI with associated systolic heart failure with recovery of ejection fraction.  She also has hypertension, atrial fibrillation on Eliquis, and lower extremity edema.  She presents today for follow-up.  On ecg she is in atrial flutter with rate of 126 bpm.  She is essentially asymptomatic and notes only occasional fatigue. The patient denies chest pain, chest pressure, dyspnea at rest or with exertion, palpitations, PND, orthopnea, or leg swelling. Denies syncope or presyncope. Denies dizziness or lightheadedness.   Was previously undergoing a workup for sleep apnea, will need follow up if not already addressed.  Past Medical History:  Diagnosis Date  . Atrial fibrillation ([place])   . CAD (coronary artery disease) 08/2009   BMS to RCA, non-obstructive in CX and LAD 2011  . CHF (congestive heart failure) ([place])   . Edema, peripheral   . Hypertension   . Nausea   . Pneumonia    January 2020  . Sleep apnea   . Trichomoniasis 06/05/2018    Past Surgical History:  Procedure Laterality Date  . CESAREAN SECTION     x 1. no BTL  . CORONARY ANGIOPLASTY WITH STENT PLACEMENT    . DILATION AND CURETTAGE, DIAGNOSTIC / THERAPEUTIC     for SAB  . HYSTEROSCOPY WITH D & C N/A 03/13/2019   Procedure: DILATATION AND CURETTAGE /HYSTEROSCOPY;  Surgeon: Aletha Halim, MD;  Location: [facility];  Service: Gynecology;  Laterality: N/A;    Current Medications: Current Meds  Medication Sig  . albuterol  (PROVENTIL HFA;VENTOLIN HFA) 108 (90 Base) MCG/ACT inhaler Inhale 2 puffs into the lungs every 6 (six) hours as needed for wheezing or shortness of breath.  Marland Kitchen apixaban (ELIQUIS) 5 MG TABS tablet Take 1 tablet (5 mg total) by mouth 2 (two) times daily. Restart early afternoon on 03/14/2019  . atorvastatin (LIPITOR) 20 MG tablet Take 1 tablet (20 mg total) by mouth daily.  Marland Kitchen diltiazem (CARDIZEM CD) 360 MG 24 hr capsule Take 1 capsule (360 mg total) by mouth daily.  . furosemide (LASIX) 40 MG tablet Take 1 tablet (40 mg total) by mouth 2 (two) times daily. For 2 weeks than resume 40mg  daily  . spironolactone (ALDACTONE) 25 MG tablet Take 0.5 tablets (12.5 mg total) by mouth daily.  . [DISCONTINUED] metoprolol (LOPRESSOR) 100 MG tablet Take 1 tablet (100 mg total) by mouth every 12 (twelve) hours.     Allergies:   Ace inhibitors and Prednisone   Social History   Socioeconomic History  . Marital status: Divorced    Spouse name: Not on file  . Number of children: Not on file  . Years of education: Not on file  . Highest education level: Not on file  Occupational History  . Not on file  Tobacco Use  . Smoking status: Current Every Day Smoker    Packs/day: 0.20    Types: Cigarettes  . Smokeless tobacco: Never Used  Substance and Sexual Activity  . Alcohol use:  Yes    Alcohol/week: 2.0 standard drinks    Types: 2 Cans of beer per week    Comment: "drink at least 3 shots a day"  . Drug use: Yes    Types: Marijuana    Comment: last time last night  . Sexual activity: Yes    Birth control/protection: Post-menopausal  Other Topics Concern  . Not on file  Social History Narrative  . Not on file   Social Determinants of Health   Financial Resource Strain:   . Difficulty of Paying Living Expenses: Not on file  Food Insecurity:   . Worried About Charity fundraiser in the Last Year: Not on file  . Ran Out of Food in the Last Year: Not on file  Transportation Needs:   . Lack of  Transportation (Medical): Not on file  . Lack of Transportation (Non-Medical): Not on file  Physical Activity:   . Days of Exercise per Week: Not on file  . Minutes of Exercise per Session: Not on file  Stress:   . Feeling of Stress : Not on file  Social Connections:   . Frequency of Communication with Friends and Family: Not on file  . Frequency of Social Gatherings with Friends and Family: Not on file  . Attends Religious Services: Not on file  . Active Member of Clubs or Organizations: Not on file  . Attends Archivist Meetings: Not on file  . Marital Status: Not on file     Family History: The patient's family history includes CAD in her maternal grandmother and mother; Diabetes Mellitus II in her father; Hypertension in her sister.  ROS:   Please see the history of present illness.    All other systems reviewed and are negative.  EKGs/Labs/Other Studies Reviewed:    The following studies were reviewed today:  EKG:  Atrial flutter 126 bpm  Epworth Sleepiness Scale: not performed today  Recent Labs: 06/20/2018: B Natriuretic Peptide 458.0 06/21/2018: Hemoglobin 14.2; Platelets 176 03/08/2019: BUN 8; Creatinine, Ser 1.04; Potassium 3.6; Sodium 140  Recent Lipid Panel No results found for: CHOL, TRIG, HDL, CHOLHDL, VLDL, LDLCALC, LDLDIRECT  Physical Exam:    VS:  BP 120/80   Pulse (!) 126   Temp (!) 97.2 F (36.2 C)   Ht 5\' 3"  (1.6 m)   Wt 209 lb 12.8 oz (95.2 kg)   BMI 37.16 kg/m     Wt Readings from Last 5 Encounters:  05/16/19 209 lb 12.8 oz (95.2 kg)  04/04/19 203 lb (92.1 kg)  03/13/19 209 lb 10.5 oz (95.1 kg)  01/29/19 210 lb (95.3 kg)  09/25/18 208 lb 3.2 oz (94.4 kg)     Constitutional: No acute distress Eyes: sclera non-icteric, normal conjunctiva and lids ENMT: normal dentition, moist mucous membranes Cardiovascular: regular rhythm, tachycardic , no murmurs. S1 and S2 normal. Radial pulses normal bilaterally. No jugular venous distention.   Respiratory: clear to auscultation bilaterally GI : normal bowel sounds, soft and nontender. No distention.   MSK: extremities warm, well perfused. No edema.  NEURO: grossly nonfocal exam, moves all extremities. PSYCH: alert and oriented x 3, normal mood and affect.   ASSESSMENT:    1. Atypical atrial flutter ([place])   2. Permanent atrial fibrillation ([place])   3. Acute on chronic diastolic CHF (congestive heart failure) ([place])   4. Essential hypertension   5. Obstructive sleep apnea (adult) (pediatric)   6. Snoring   7. CAD S/P percutaneous coronary angioplasty  PLAN:    Atrial flutter-she appears to be in atrial flutter today with a rate of 126.  She is currently on rate control with diltiazem and metoprolol.  I would like to uptitrate her metoprolol to 150 mg twice daily and monitor for symptoms.  I am concerned that she may be living in higher rates consistently, and with her history of cardiomyopathy this may predispose her to tachycardia mediated cardiomyopathy.  I will have her follow-up with A. fib clinic for discussion of further therapies such as antiarrhythmic drugs.  She is felt to have permanent atrial fibrillation, therefore I suspect a rhythm control strategy may not be successful.  She continues on Eliquis 5 mg twice daily  Acute on chronic diastolic heart failure-she is doing well at this time and appears euvolemic.  Essential hypertension-blood pressures well controlled, continue current therapy.  We will uptitrate metoprolol for rate control effect.  Obstructive sleep apnea-follow-up with sleep medicine  CAD status post PCI-on Eliquis and metoprolol.  Stable. TIME SPENT WITH PATIENT: 25 minutes of direct patient care. More than 50% of that time was spent on coordination of care and counseling regarding the above-mentioned issues.  Cherlynn Kaiser, MD [place]  [facility]   Medication Adjustments/Labs and Tests Ordered: Current medicines are reviewed at  length with the patient today.  Concerns regarding medicines are outlined above.  No orders of the defined types were placed in this encounter.  Meds ordered this encounter  Medications  . metoprolol tartrate (LOPRESSOR) 100 MG tablet    Sig: Take 1.5 tablets (150 mg total) by mouth 2 (two) times daily.    Dispense:  135 tablet    Refill:  3    Patient Instructions  Medication Instructions:   Increase  Metoprolol tartrate  150 mg  Twice  A day *If you need a refill on your cardiac medications before your next appointment, please call your pharmacy*  Lab Work: *not needed If you have labs (blood work) drawn today and your tests are completely normal, you will receive your results only by: Marland Kitchen MyChart Message (if you have MyChart) OR . A paper copy in the mail If you have any lab test that is abnormal or we need to change your treatment, we will call you to review the results.  Testing/Procedures: Not needed  Follow-Up: At [facility], you and your health needs are our priority.  As part of our continuing mission to provide you with exceptional heart care, we have created designated Provider Care Teams.  These Care Teams include your primary Cardiologist (physician) and Advanced Practice Providers (APPs -  Physician Assistants and Nurse Practitioners) who all work together to provide you with the care you need, when you need it.  Your next appointment:   1 month(s)  The format for your next appointment:   In Person  Provider:   afib clinic

## 2019-05-16 NOTE — Patient Instructions (Signed)
Medication Instructions:   Increase  Metoprolol tartrate  150 mg  Twice  A day *If you need a refill on your cardiac medications before your next appointment, please call your pharmacy*  Lab Work: *not needed If you have labs (blood work) drawn today and your tests are completely normal, you will receive your results only by: Marland Kitchen MyChart Message (if you have MyChart) OR . A paper copy in the mail If you have any lab test that is abnormal or we need to change your treatment, we will call you to review the results.  Testing/Procedures: Not needed  Follow-Up: At [facility], you and your health needs are our priority.  As part of our continuing mission to provide you with exceptional heart care, we have created designated Provider Care Teams.  These Care Teams include your primary Cardiologist (physician) and Advanced Practice Providers (APPs -  Physician Assistants and Nurse Practitioners) who all work together to provide you with the care you need, when you need it.  Your next appointment:   1 month(s)  The format for your next appointment:   In Person  Provider:   afib clinic

## 2019-05-20 ENCOUNTER — Other Ambulatory Visit (INDEPENDENT_AMBULATORY_CARE_PROVIDER_SITE_OTHER): Payer: Medicare HMO

## 2019-05-20 DIAGNOSIS — I509 Heart failure, unspecified: Secondary | ICD-10-CM

## 2019-05-20 DIAGNOSIS — I4821 Permanent atrial fibrillation: Secondary | ICD-10-CM

## 2019-05-20 DIAGNOSIS — I4891 Unspecified atrial fibrillation: Secondary | ICD-10-CM | POA: Diagnosis not present

## 2019-05-20 DIAGNOSIS — I10 Essential (primary) hypertension: Secondary | ICD-10-CM | POA: Diagnosis not present

## 2019-05-20 DIAGNOSIS — I5020 Unspecified systolic (congestive) heart failure: Secondary | ICD-10-CM | POA: Diagnosis not present

## 2019-05-20 DIAGNOSIS — I5033 Acute on chronic diastolic (congestive) heart failure: Secondary | ICD-10-CM

## 2019-05-20 DIAGNOSIS — I4892 Unspecified atrial flutter: Secondary | ICD-10-CM | POA: Diagnosis not present

## 2019-06-05 ENCOUNTER — Other Ambulatory Visit: Payer: Self-pay | Admitting: Internal Medicine

## 2019-06-05 MED ORDER — FUROSEMIDE 40 MG PO TABS: 40.0000 mg | ORAL_TABLET | Freq: Every day | ORAL | 3 refills | Status: DC

## 2019-06-05 MED ORDER — ATORVASTATIN CALCIUM 20 MG PO TABS: 20.0000 mg | ORAL_TABLET | Freq: Every day | ORAL | 3 refills | Status: DC

## 2019-06-05 MED ORDER — APIXABAN 5 MG PO TABS: 5.0000 mg | ORAL_TABLET | Freq: Two times a day (BID) | ORAL | 1 refills | Status: DC

## 2019-06-05 MED ORDER — SPIRONOLACTONE 25 MG PO TABS: 12.5000 mg | ORAL_TABLET | Freq: Every day | ORAL | 3 refills | Status: DC

## 2019-06-05 MED ORDER — DILTIAZEM HCL ER COATED BEADS 360 MG PO CP24: 360.0000 mg | ORAL_CAPSULE | Freq: Every day | ORAL | 3 refills | Status: DC

## 2019-06-05 NOTE — Telephone Encounter (Signed)
*  STAT* If patient is at the pharmacy, call can be transferred to refill team.   1. Which medications need to be refilled? (please list name of each medication and dose if known)   apixaban (ELIQUIS) 5 MG TABS tablet  spironolactone (ALDACTONE) 25 MG tablet furosemide (LASIX) 40 MG tablet atorvastatin (LIPITOR) 20 MG tablet diltiazem (CARDIZEM CD) 360 MG 24 hr capsule  2. Which pharmacy/location (including street and city if local pharmacy) is medication to be sent to? [place] ([place]), [place] - [address]  3. Do they need a 30 day or 90 day supply? 90 day

## 2019-06-05 NOTE — Telephone Encounter (Signed)
Rx(s) sent to pharmacy electronically.  

## 2019-06-13 DIAGNOSIS — G4733 Obstructive sleep apnea (adult) (pediatric): Secondary | ICD-10-CM | POA: Diagnosis not present

## 2019-06-20 ENCOUNTER — Ambulatory Visit (HOSPITAL_COMMUNITY): Payer: Medicare HMO | Admitting: Nurse Practitioner

## 2019-06-21 ENCOUNTER — Other Ambulatory Visit: Payer: Self-pay

## 2019-06-21 NOTE — Telephone Encounter (Signed)
Patient of Dr. Margaretann Loveless please address thank you.

## 2019-06-24 MED ORDER — APIXABAN 5 MG PO TABS: 5.0000 mg | ORAL_TABLET | Freq: Two times a day (BID) | ORAL | 1 refills | Status: DC

## 2019-06-26 ENCOUNTER — Ambulatory Visit (HOSPITAL_COMMUNITY)
Admission: RE | Admit: 2019-06-26 | Discharge: 2019-06-26 | Disposition: A | Discharge status: Home / Self Care | Payer: Medicare HMO | Source: Ambulatory Visit | Attending: Nurse Practitioner | Admitting: Nurse Practitioner

## 2019-06-26 ENCOUNTER — Encounter (HOSPITAL_COMMUNITY): Payer: Self-pay | Admitting: Nurse Practitioner

## 2019-06-26 ENCOUNTER — Other Ambulatory Visit: Payer: Self-pay

## 2019-06-26 VITALS — BP 122/90 | HR 117 | Ht 63.0 in | Wt 209.8 lb

## 2019-06-26 DIAGNOSIS — F17210 Nicotine dependence, cigarettes, uncomplicated: Secondary | ICD-10-CM | POA: Diagnosis not present

## 2019-06-26 DIAGNOSIS — G4733 Obstructive sleep apnea (adult) (pediatric): Secondary | ICD-10-CM | POA: Diagnosis not present

## 2019-06-26 DIAGNOSIS — I2510 Atherosclerotic heart disease of native coronary artery without angina pectoris: Secondary | ICD-10-CM | POA: Diagnosis not present

## 2019-06-26 DIAGNOSIS — Z79899 Other long term (current) drug therapy: Secondary | ICD-10-CM | POA: Diagnosis not present

## 2019-06-26 DIAGNOSIS — I4892 Unspecified atrial flutter: Secondary | ICD-10-CM | POA: Insufficient documentation

## 2019-06-26 DIAGNOSIS — Z7984 Long term (current) use of oral hypoglycemic drugs: Secondary | ICD-10-CM | POA: Insufficient documentation

## 2019-06-26 DIAGNOSIS — D6869 Other thrombophilia: Secondary | ICD-10-CM | POA: Diagnosis not present

## 2019-06-26 DIAGNOSIS — R69 Illness, unspecified: Secondary | ICD-10-CM | POA: Diagnosis not present

## 2019-06-26 DIAGNOSIS — I509 Heart failure, unspecified: Secondary | ICD-10-CM | POA: Insufficient documentation

## 2019-06-26 DIAGNOSIS — I4821 Permanent atrial fibrillation: Secondary | ICD-10-CM | POA: Diagnosis not present

## 2019-06-26 DIAGNOSIS — Z7901 Long term (current) use of anticoagulants: Secondary | ICD-10-CM | POA: Diagnosis not present

## 2019-06-26 DIAGNOSIS — I110 Hypertensive heart disease with heart failure: Secondary | ICD-10-CM | POA: Diagnosis not present

## 2019-06-26 NOTE — Progress Notes (Addendum)
Primary Care Physician: Mardi Mainland, FNP Referring Physician: Dr. Steffanie Dunn Rhonda [name] is a 64 y.o. female with a h/o CAD, CHF, HTN,  persistent atrial fibrillation/ flutter that is in the afib clinic for discussion for antiarrythmic's per the referral of Dr. Margaretann Loveless. Despit large doses of rate control drugs she continues to have RVR.  Acoording pot the pat, she moved here in 2017 form the [facility] area. She believes she was in afib for 2 years prior to that. She states that she has always had fast heart rate.  She had cardioversions x3 in the past all with ERAF. She  has been on amiodarone in the past.   She states that she has OSA tx with CPAP but many nights only is able to wear this 2-4 hours a night. She drinks 2-3 alcohol drinks a night and smokes marijuana several times a week. She  did smoke until the episode with Pneumonia for which she was hospitalized 06/2018, at which time she quit. Dr. Curt Bears did see pt in consult at time of admission and labeled pt then permanent afib and the RVR at the time, he felt was 2/2 active infection, and would improve  with recovery from  pneumonia.     Echo in 2019 showed EF preserved at 50  to 55%, but left atrium severely enlarged at 63 mm.   She is on eliquis 5 mg bid with a CHA2DS2VASc score of 4.   Today, she denies symptoms of palpitations, chest pain, shortness of breath, orthopnea, PND, lower extremity edema, dizziness, presyncope, syncope, or neurologic sequela. The patient is tolerating medications without difficulties and is otherwise without complaint today.   Past Medical History:  Diagnosis Date  . Atrial fibrillation ([place])   . CAD (coronary artery disease) 08/2009   BMS to RCA, non-obstructive in CX and LAD 2011  . CHF (congestive heart failure) ([place])   . Edema, peripheral   . Hypertension   . Nausea   . Pneumonia    January 2020  . Sleep apnea   . Trichomoniasis 06/05/2018   Past Surgical  History:  Procedure Laterality Date  . CESAREAN SECTION     x 1. no BTL  . CORONARY ANGIOPLASTY WITH STENT PLACEMENT    . DILATION AND CURETTAGE, DIAGNOSTIC / THERAPEUTIC     for SAB  . HYSTEROSCOPY WITH D & C N/A 03/13/2019   Procedure: DILATATION AND CURETTAGE /HYSTEROSCOPY;  Surgeon: Aletha Halim, MD;  Location: [place];  Service: Gynecology;  Laterality: N/A;    Current Outpatient Medications  Medication Sig Dispense Refill  . albuterol (PROVENTIL HFA;VENTOLIN HFA) 108 (90 Base) MCG/ACT inhaler Inhale 2 puffs into the lungs every 6 (six) hours as needed for wheezing or shortness of breath. 1 Inhaler 2  . apixaban (ELIQUIS) 5 MG TABS tablet Take 1 tablet (5 mg total) by mouth 2 (two) times daily. Restart early afternoon on 03/14/2019 180 tablet 1  . atorvastatin (LIPITOR) 20 MG tablet Take 1 tablet (20 mg total) by mouth daily. 90 tablet 3  . diltiazem (CARDIZEM CD) 360 MG 24 hr capsule Take 1 capsule (360 mg total) by mouth daily. 90 capsule 3  . furosemide (LASIX) 40 MG tablet Take 1 tablet (40 mg total) by mouth daily. 90 tablet 3  . metoprolol tartrate (LOPRESSOR) 100 MG tablet Take 1.5 tablets (150 mg total) by mouth 2 (two) times daily. 135 tablet 3  . spironolactone (ALDACTONE) 25 MG tablet Take  0.5 tablets (12.5 mg total) by mouth daily. 45 tablet 3   No current facility-administered medications for this encounter.    Allergies  Allergen Reactions  . Ace Inhibitors Cough  . Prednisone Itching and Swelling    Itching on leg and arms swelling from knee to ankles and felt restless    Social History   Socioeconomic History  . Marital status: Divorced    Spouse name: Not on file  . Number of children: Not on file  . Years of education: Not on file  . Highest education level: Not on file  Occupational History  . Not on file  Tobacco Use  . Smoking status: Current Every Day Smoker    Packs/day: 0.20    Types: Cigarettes  . Smokeless tobacco: Never  Used  Substance and Sexual Activity  . Alcohol use: Yes    Alcohol/week: 2.0 standard drinks    Types: 2 Cans of beer per week    Comment: "drink at least 3 shots a day"  . Drug use: Yes    Types: Marijuana    Comment: last time last night  . Sexual activity: Yes    Birth control/protection: Post-menopausal  Other Topics Concern  . Not on file  Social History Narrative  . Not on file   Social Determinants of Health   Financial Resource Strain:   . Difficulty of Paying Living Expenses: Not on file  Food Insecurity:   . Worried About Charity fundraiser in the Last Year: Not on file  . Ran Out of Food in the Last Year: Not on file  Transportation Needs:   . Lack of Transportation (Medical): Not on file  . Lack of Transportation (Non-Medical): Not on file  Physical Activity:   . Days of Exercise per Week: Not on file  . Minutes of Exercise per Session: Not on file  Stress:   . Feeling of Stress : Not on file  Social Connections:   . Frequency of Communication with Friends and Family: Not on file  . Frequency of Social Gatherings with Friends and Family: Not on file  . Attends Religious Services: Not on file  . Active Member of Clubs or Organizations: Not on file  . Attends Archivist Meetings: Not on file  . Marital Status: Not on file  Intimate Partner Violence:   . Fear of Current or Ex-Partner: Not on file  . Emotionally Abused: Not on file  . Physically Abused: Not on file  . Sexually Abused: Not on file    Family History  Problem Relation Age of Onset  . CAD Mother   . Diabetes Mellitus II Father   . Hypertension Sister   . CAD Maternal Grandmother     ROS- All systems are reviewed and negative except as per the HPI above  Physical Exam: There were no vitals filed for this visit. Wt Readings from Last 3 Encounters:  05/16/19 95.2 kg  04/04/19 92.1 kg  03/13/19 95.1 kg    Labs: Lab Results  Component Value Date   NA 140 03/08/2019   K 3.6  03/08/2019   CL 96 (L) 03/08/2019   CO2 25 03/08/2019   GLUCOSE 101 (H) 03/08/2019   BUN 8 03/08/2019   CREATININE 1.04 (H) 03/08/2019   CALCIUM 8.8 (L) 03/08/2019   Lab Results  Component Value Date   INR 1.11 02/03/2016   No results found for: CHOL, HDL, LDLCALC, TRIG   GEN- The patient is well appearing, alert  and oriented x 3 today.   Head- normocephalic, atraumatic Eyes-  Sclera clear, conjunctiva pink Ears- hearing intact Oropharynx- clear Neck- supple, no JVP Lymph- no cervical lymphadenopathy Lungs- Clear to ausculation bilaterally, normal work of breathing Heart- Rapid irregular rate and rhythm, no murmurs, rubs or gallops, PMI not laterally displaced GI- soft, NT, ND, + BS Extremities- no clubbing, cyanosis, or edema MS- no significant deformity or atrophy Skin- no rash or lesion Psych- euthymic mood, full affect Neuro- strength and sensation are intact  EKG-afib at 117 bpm with RVR, qrs int 92 ms, qtc 477 ms Epic records reviewed   Assessment and Plan: 1. Permanent afib Pt has been in longstanding afib for at least 6 years and with severely dilated left atrium,  I do not think we will be able with AAD's   to restore/maintain  NSR. She also is not an ablation candidate for the same reasons.   She appears to be tolerating RVR well, not aware of her heart rhythm today There is potential for tachycardia  mediated cardiomyopathy but so far her EF has not dropped with RVR over the years. She did have a drop in EF at time of MI when pt was 54 but recovered.  She is already on large amounts of CCB/BB at 360 mg Cardizem daily and metoprolol tartrate 150 mg bid and tolerating.  I will discuss with Dr. Curt Bears and review with pt  My only thought for intervention  is possibly a PPM with AV nodal ablation, otherwise continue with rate control strategy   2. CHA2DS2VASc score of 4 Continue on eliquis 5 mg bid   3. HTN Stable  4. CAD No current angianl symptoms    I  will further discuss with pt after I  discus with Dr. Curt Bears.  Addendum: I spoke to Dr. Curt Bears and he feels rate control strategy  is the most realistic option for pt. He would recommend rechecking echo to see if EF is staying in normal range. He would not want to commit her to a PPM with av nodal ablation when she is asymptomatic at this point.   Geroge Baseman Brynnleigh Mcelwee, [facility] [address] [place], [place] [address] [phone]

## 2019-06-26 NOTE — Addendum Note (Signed)
Encounter addended by: Sherran Needs, NP on: 06/26/2019 4:11 PM  Actions taken: Clinical Note Signed

## 2019-07-14 DIAGNOSIS — G4733 Obstructive sleep apnea (adult) (pediatric): Secondary | ICD-10-CM | POA: Diagnosis not present

## 2019-08-11 DIAGNOSIS — G4733 Obstructive sleep apnea (adult) (pediatric): Secondary | ICD-10-CM | POA: Diagnosis not present

## 2019-09-11 DIAGNOSIS — G4733 Obstructive sleep apnea (adult) (pediatric): Secondary | ICD-10-CM | POA: Diagnosis not present

## 2019-09-19 DIAGNOSIS — I2510 Atherosclerotic heart disease of native coronary artery without angina pectoris: Secondary | ICD-10-CM | POA: Diagnosis not present

## 2019-09-19 DIAGNOSIS — I509 Heart failure, unspecified: Secondary | ICD-10-CM | POA: Diagnosis not present

## 2019-09-19 DIAGNOSIS — E669 Obesity, unspecified: Secondary | ICD-10-CM | POA: Diagnosis not present

## 2019-09-19 DIAGNOSIS — J309 Allergic rhinitis, unspecified: Secondary | ICD-10-CM | POA: Diagnosis not present

## 2019-09-19 DIAGNOSIS — Z833 Family history of diabetes mellitus: Secondary | ICD-10-CM | POA: Diagnosis not present

## 2019-09-27 ENCOUNTER — Other Ambulatory Visit: Payer: Self-pay | Admitting: Nurse Practitioner

## 2019-09-27 DIAGNOSIS — Z1231 Encounter for screening mammogram for malignant neoplasm of breast: Secondary | ICD-10-CM

## 2019-10-22 DIAGNOSIS — Z23 Encounter for immunization: Secondary | ICD-10-CM | POA: Diagnosis not present

## 2019-10-23 ENCOUNTER — Ambulatory Visit
Admission: RE | Admit: 2019-10-23 | Discharge: 2019-10-23 | Disposition: A | Discharge status: Home / Self Care | Payer: Medicare HMO | Source: Ambulatory Visit | Attending: Nurse Practitioner | Admitting: Nurse Practitioner

## 2019-10-23 DIAGNOSIS — Z1231 Encounter for screening mammogram for malignant neoplasm of breast: Secondary | ICD-10-CM | POA: Insufficient documentation

## 2019-11-08 ENCOUNTER — Telehealth: Payer: Self-pay

## 2019-11-08 NOTE — Telephone Encounter (Signed)
Opened for clearance however clearance form was incomplete.

## 2019-11-11 ENCOUNTER — Telehealth: Payer: Self-pay

## 2019-11-11 NOTE — Telephone Encounter (Signed)
   [facility] Pre-operative Risk Assessment     Request for surgical clearance:  1. What type of surgery is being performed? Nothing planned at this time but in case she needs something such as cleaning, fillings, tooth extraction, crown    2. When is this surgery scheduled? TBD/ nothing planned at this time    3. What type of clearance is required (medical clearance vs. Pharmacy clearance to hold med vs. Both)? Both   4. Are there any medications that need to be held prior to surgery and how long? Eliquis if any planned extractions   5. Practice name and name of physician performing surgery? [facility] Dr. Martinique Thomas    6. What is the office phone number?  [phone]   7.   What is the office fax number?  [phone]  8.   Anesthesia type (None, local, MAC, general) ?  If extraction will use local with epinephrine and hydrocodone.

## 2019-11-12 NOTE — Telephone Encounter (Signed)
Will remove from pre-op pool and wait for updated clearance form once procedure has been planned.

## 2019-11-12 NOTE — Telephone Encounter (Signed)
Called both the patient and the dentist office and made them aware of the information below. They are aware that we will hold off on clearance until this information is obtained. Courtney with [facility] states that they will fax over more specific information of the planned procedures once they have them. This request will be removed from the pool until additional information is received.

## 2019-11-12 NOTE — Telephone Encounter (Signed)
Pre-op covering staff, can you please let patient and requesting dentist office know that we will need updated clearance form whenever they know more about what specific procedures will need to be done (if any). Typically, we do not need to hold blood thinners if it is a simple tooth extraction but this also depends on how many teeth are needing to be extracted.  Thank you!

## 2019-12-10 ENCOUNTER — Telehealth: Payer: Self-pay | Admitting: Internal Medicine

## 2019-12-10 DIAGNOSIS — I509 Heart failure, unspecified: Secondary | ICD-10-CM

## 2019-12-10 DIAGNOSIS — I4821 Permanent atrial fibrillation: Secondary | ICD-10-CM

## 2019-12-10 NOTE — Telephone Encounter (Signed)
Spoke to [place] - he is to have Terrah re-fax clearance to [employer]. Thank you!

## 2019-12-10 NOTE — Telephone Encounter (Signed)
   Primary Cardiologist: Elouise Munroe, MD  Chart reviewed as part of pre-operative protocol coverage.   For cleaning, simple filling and simple dental extractions are considered low risk procedures per guidelines and generally do not require any specific cardiac clearance. It is also generally accepted that for simple extractions and dental cleanings, there is no need to interrupt blood thinner therapy.  We typically do not hold Eliquis for 1-2 teeth extraction or filling.   SBE prophylaxis is not required for the patient.  I will route this recommendation to the requesting party via Epic fax function and remove from pre-op pool.  Please call with questions.  Note, patient is overdue for follow up, callback pool to arrange Echocardiogram as previous recommended by Roderic Palau and Dr. Curt Bears in Jan 2021 and follow up with Dr. Margaretann Loveless as her heart rate was still not very well controlled while in afib. This does not need to be prior to the dental procedure given the low risk nature of the surgery.   [place], [place] 12/10/2019, 2:16 PM

## 2019-12-10 NOTE — Addendum Note (Signed)
Addended by: Jacqulynn Cadet on: 12/10/2019 02:49 PM   Modules accepted: Orders

## 2019-12-10 NOTE — Telephone Encounter (Signed)
1. What dental office are you calling from? [facility]   2. What is your office phone number? [phone]  3. What is your fax number? [phone]  4. What type of procedure is the patient having performed? Fillings, in the future in might be extractions.   5. What date is procedure scheduled or is the patient there now? 12/11/19 (if the patient is at the dentist's office question goes to their cardiologist if he/she is in the office.  If not, question should go to the DOD).   6. What is your question (ex. Antibiotics prior to procedure, holding medication-we need to know how long dentist wants pt to hold med)? They need to know list of medication, any allergies, any known medical conditions, and a list of any special precautions they should be aware of. Do you feel the patient can be safely treated during this dental procedure.

## 2019-12-10 NOTE — Telephone Encounter (Signed)
Called and left a voice message for the patient to give our office a call to get scheduled for a follow up appointment with Dr. Margaretann Loveless and for a repeat Echocardiogram.

## 2019-12-11 NOTE — Telephone Encounter (Signed)
Called [facility] and spoke with Miranda, she confirmed that their office did receive the clearance faxed to their office.

## 2020-01-02 ENCOUNTER — Other Ambulatory Visit (HOSPITAL_COMMUNITY): Payer: Medicare HMO

## 2020-01-03 ENCOUNTER — Ambulatory Visit (HOSPITAL_COMMUNITY): Payer: Medicare HMO | Attending: Cardiovascular Disease

## 2020-01-03 ENCOUNTER — Other Ambulatory Visit: Payer: Self-pay

## 2020-01-03 DIAGNOSIS — I509 Heart failure, unspecified: Secondary | ICD-10-CM | POA: Insufficient documentation

## 2020-01-03 DIAGNOSIS — I4821 Permanent atrial fibrillation: Secondary | ICD-10-CM | POA: Diagnosis not present

## 2020-01-03 LAB — ECHOCARDIOGRAM COMPLETE
MV M vel: 5.11 m/s
MV Peak grad: 104.4 mmHg
Radius: 0.7 cm
S' Lateral: 3.9 cm

## 2020-01-07 ENCOUNTER — Ambulatory Visit: Payer: Medicare HMO | Admitting: Physician Assistant

## 2020-01-07 ENCOUNTER — Encounter: Payer: Self-pay | Admitting: Physician Assistant

## 2020-01-07 ENCOUNTER — Other Ambulatory Visit: Payer: Self-pay

## 2020-01-07 VITALS — BP 108/80 | HR 91 | Ht 63.0 in | Wt 213.4 lb

## 2020-01-07 DIAGNOSIS — Z79899 Other long term (current) drug therapy: Secondary | ICD-10-CM

## 2020-01-07 DIAGNOSIS — I10 Essential (primary) hypertension: Secondary | ICD-10-CM | POA: Diagnosis not present

## 2020-01-07 DIAGNOSIS — I509 Heart failure, unspecified: Secondary | ICD-10-CM | POA: Diagnosis not present

## 2020-01-07 DIAGNOSIS — G4733 Obstructive sleep apnea (adult) (pediatric): Secondary | ICD-10-CM

## 2020-01-07 DIAGNOSIS — I2510 Atherosclerotic heart disease of native coronary artery without angina pectoris: Secondary | ICD-10-CM | POA: Diagnosis not present

## 2020-01-07 DIAGNOSIS — R931 Abnormal findings on diagnostic imaging of heart and coronary circulation: Secondary | ICD-10-CM

## 2020-01-07 DIAGNOSIS — I4821 Permanent atrial fibrillation: Secondary | ICD-10-CM | POA: Diagnosis not present

## 2020-01-07 NOTE — Progress Notes (Signed)
Cardiology Office Note:    Date:  01/09/2020   ID:  [name] [birth year], MRN [id number]  PCP:  Mardi Mainland, [facility] Cardiologist:  Elouise Munroe, MD  [place] Electrophysiologist:  None   Referring MD: Mardi Mainland,*   Chief Complaint  Patient presents with  . Follow-up    seen for Dr. Margaretann Loveless    History of Present Illness:    Rhonda [name] is a 64 y.o. female with a hx of CAD, ICM with improved EF, permanent atrial fibrillation on Eliquis, HTN, OSA and leg edema.  She had MRI at the age of 38.  Her anginal symptom was burning sensation in the throat.  She underwent a stent placement to the RCA, left circumflex and LAD disease in March 2011.  She was also in atrial fibrillation at the time, TEE was aborted due to intraprocedural desaturations.  At the time of cardiac catheterization, her EF dropped down to 30% along with moderate to severe MR, however EF later improved to normal range 65% by August 2015, repeat echocardiogram also showed MR has improved to mild to moderate range.  She previously lived in [place], later moved to [place] in 2017.  She was previously seen by Dr. Lynnae January MD with [facility]. She was admitted for heart failure exacerbation in 2017 and was seen by Dr. Johnsie Cancel at the time.  She later establish with Dr. Margaretann Loveless in 2019.  Echocardiogram obtained on 04/03/2018 showed EF 50 to 55%, mild MR, severe LAE.  She was hospitalized in January 2020 for pneumonia, she was seen by Dr. Curt Bears at the time who felt she likely has permanent atrial fibrillation.  She was diagnosed with severe obstructive sleep apnea on sleep study done by Dr. Claiborne Billings on 07/31/2018.  Patient was last seen by Dr. Margaretann Loveless on 05/16/2019, at the time she was in atrial flutter with heart rate of 126 bpm.  She was asymptomatic and only noticed occasional fatigue.  At the time, Dr. Margaretann Loveless was concerned that the  patient may be living heart rate consistently, her metoprolol was further increased to 150 mg twice daily.  Patient was referred to A. fib clinic to consider alternative therapy such as antiarrhythmic therapy to help slow down her heart rate. When she was seen in the A. fib clinic on 06/26/2019, her heart rates remain uncontrolled at 117 bpm.  She was felt not to be able to restore sinus rhythm even with antiarrhythmic therapy nor will she be ablation candidate given the severely dilated left atrium. Otherwise no medication adjustment was done during that visit.  Dr. Curt Bears did recommend rate control strategy for the time being and a repeat echocardiogram to make sure her EF is in the normal range.  Repeat echocardiogram performed on 01/03/2020 showed EF 50 to 55%, inferior basal hypokinesis, moderate MR with moderate to severe MS, TEE was recommended.  Patient presents today to discuss echocardiogram results.  On physical exam, she actually does not have any heart murmur.  Otherwise she has no exertional shortness of breath, chest pain, orthopnea or PND.  She has chronic lower extremity edema, however starting around 3 months ago she is noticed increasing left lower extremity swelling.  For the past month she also noticed right lower extremity swelling.  On exam, her lungs is clear.  As far as her leg edema, I recommended increase Lasix to 60 mg daily.  She will need a basic metabolic panel  in 5 to 7 days.  Otherwise I have reviewed her echocardiogram with Dr. Margaretann Loveless , we took a look at her raw image, it is very questionable whether or not she truly has aortic stenosis.  Given her asymptomatic nature, we recommend continued observation at this time and deferred the TEE.  Her chamber size of LV is normal.  I plan to set her up to see Dr. Margaretann Loveless in 1 to 3 months for follow-up.   Past Medical History:  Diagnosis Date  . Atrial fibrillation ([place])   . CAD (coronary artery disease) 08/2009   BMS to RCA,  non-obstructive in CX and LAD 2011  . CHF (congestive heart failure) ([place])   . Edema, peripheral   . Hypertension   . Nausea   . Pneumonia    January 2020  . Sleep apnea   . Trichomoniasis 06/05/2018    Past Surgical History:  Procedure Laterality Date  . CESAREAN SECTION     x 1. no BTL  . CORONARY ANGIOPLASTY WITH STENT PLACEMENT    . DILATION AND CURETTAGE, DIAGNOSTIC / THERAPEUTIC     for SAB  . HYSTEROSCOPY WITH D & C N/A 03/13/2019   Procedure: DILATATION AND CURETTAGE /HYSTEROSCOPY;  Surgeon: Aletha Halim, MD;  Location: [facility];  Service: Gynecology;  Laterality: N/A;    Current Medications: Current Meds  Medication Sig  . albuterol (PROVENTIL HFA;VENTOLIN HFA) 108 (90 Base) MCG/ACT inhaler Inhale 2 puffs into the lungs every 6 (six) hours as needed for wheezing or shortness of breath.  Marland Kitchen apixaban (ELIQUIS) 5 MG TABS tablet Take 1 tablet (5 mg total) by mouth 2 (two) times daily. Restart early afternoon on 03/14/2019  . atorvastatin (LIPITOR) 20 MG tablet Take 1 tablet (20 mg total) by mouth daily.  Marland Kitchen diltiazem (CARDIZEM CD) 360 MG 24 hr capsule Take 1 capsule (360 mg total) by mouth daily.  . furosemide (LASIX) 40 MG tablet Take 1 tablet (40 mg total) by mouth daily.  Marland Kitchen levocetirizine (XYZAL) 5 MG tablet SMARTSIG:1 Tablet(s) By Mouth Every Evening  . metoprolol tartrate (LOPRESSOR) 100 MG tablet Take 1.5 tablets (150 mg total) by mouth 2 (two) times daily.  Marland Kitchen spironolactone (ALDACTONE) 25 MG tablet Take 0.5 tablets (12.5 mg total) by mouth daily.     Allergies:   Ace inhibitors and Prednisone   Social History   Socioeconomic History  . Marital status: Divorced    Spouse name: Not on file  . Number of children: Not on file  . Years of education: Not on file  . Highest education level: Not on file  Occupational History  . Not on file  Tobacco Use  . Smoking status: Former Smoker    Packs/day: 0.20    Types: Cigarettes  . Smokeless tobacco:  Never Used  Vaping Use  . Vaping Use: Never used  Substance and Sexual Activity  . Alcohol use: Yes    Alcohol/week: 5.0 standard drinks    Types: 2 Cans of beer, 3 Standard drinks or equivalent per week    Comment: "drink at least 3 shots a day"  . Drug use: Yes    Types: Marijuana    Comment: nightly  . Sexual activity: Yes    Birth control/protection: Post-menopausal  Other Topics Concern  . Not on file  Social History Narrative  . Not on file   Social Determinants of Health   Financial Resource Strain:   . Difficulty of Paying Living Expenses:   Food Insecurity:   .  Worried About Charity fundraiser in the Last Year:   . Arboriculturist in the Last Year:   Transportation Needs:   . Film/video editor (Medical):   Marland Kitchen Lack of Transportation (Non-Medical):   Physical Activity:   . Days of Exercise per Week:   . Minutes of Exercise per Session:   Stress:   . Feeling of Stress :   Social Connections:   . Frequency of Communication with Friends and Family:   . Frequency of Social Gatherings with Friends and Family:   . Attends Religious Services:   . Active Member of Clubs or Organizations:   . Attends Archivist Meetings:   Marland Kitchen Marital Status:      Family History: The patient's family history includes CAD in her maternal grandmother and mother; Diabetes Mellitus II in her father; Hypertension in her sister. There is no history of Breast cancer.  ROS:   Please see the history of present illness.     All other systems reviewed and are negative.  EKGs/Labs/Other Studies Reviewed:    The following studies were reviewed today:  Echo 01/03/2020 IMPRESSIONS    1. Inferior basal hypokinesis. Left ventricular ejection fraction, by  estimation, is 50 to 55%. The left ventricle has low normal function. The  left ventricle demonstrates regional wall motion abnormalities (see  scoring diagram/findings for  description). The left ventricular internal cavity  size was mildly  dilated. Left ventricular diastolic parameters are indeterminate.  2. Right ventricular systolic function is normal. The right ventricular  size is normal. There is moderately elevated pulmonary artery systolic  pressure.  3. Left atrial size was severely dilated.  4. Right atrial size was moderately dilated.  5. MR eccentric posterior ? from atrial enlargement and type 3A  restricted posterior leaflet motion. Consider TEE for further assess  severity if clinically indicated . The mitral valve is normal in  structure. Moderate mitral valve regurgitation.  Moderate to severe mitral stenosis.  6. Tricuspid valve regurgitation is moderate.  7. The aortic valve is normal in structure. Aortic valve regurgitation is  not visualized. No aortic stenosis is present.  8. The inferior vena cava is normal in size with greater than 50%  respiratory variability, suggesting right atrial pressure of 3 mmHg.   Comparison(s): 04/03/18 EF 50-55%.   EKG:  EKG is ordered today.  The ekg ordered today demonstrates atrial fibrillation, heart rate 91, otherwise no significant ST-T wave changes.  Recent Labs: 03/08/2019: BUN 8; Creatinine, Ser 1.04; Potassium 3.6; Sodium 140  Recent Lipid Panel No results found for: CHOL, TRIG, HDL, CHOLHDL, VLDL, LDLCALC, LDLDIRECT  Physical Exam:    VS:  BP 108/80 (BP Location: Left Arm)   Pulse 91   Ht 5\' 3"  (1.6 m)   Wt 213 lb 6.4 oz (96.8 kg)   SpO2 97%   BMI 37.80 kg/m     Wt Readings from Last 3 Encounters:  01/07/20 213 lb 6.4 oz (96.8 kg)  06/26/19 209 lb 12.8 oz (95.2 kg)  05/16/19 209 lb 12.8 oz (95.2 kg)     GEN:  Well nourished, well developed in no acute distress HEENT: Normal NECK: No JVD; No carotid bruits LYMPHATICS: No lymphadenopathy CARDIAC: irregularly irregular, no murmurs, rubs, gallops RESPIRATORY:  Clear to auscultation without rales, wheezing or rhonchi  ABDOMEN: Soft, non-tender,  non-distended MUSCULOSKELETAL: Bilateral lower extremity edema; No deformity  SKIN: Warm and dry NEUROLOGIC:  Alert and oriented x 3 PSYCHIATRIC:  Normal affect  ASSESSMENT:    1. Acute on chronic congestive heart failure, unspecified heart failure type ([place])   2. Medication management   3. Permanent atrial fibrillation ([place])   4. Abnormal echocardiogram   5. Coronary artery disease involving native coronary artery of native heart without angina pectoris   6. Essential hypertension   7. OSA (obstructive sleep apnea)    PLAN:    In order of problems listed above:  1. Acute on chronic diastolic heart failure: Despite persistent atrial fibrillation, recent echocardiogram showed EF 50 to 55%.  She has had increasing bilateral lower extremity edema, I suspect she has a component of lymphedema in the lower extremity, however since the swelling is worse than usual, I recommend to increase the Lasix to 60 mg daily.  She will need a basic metabolic panel in 5 to 7 days to check renal function and electrolyte.  She is not on potassium supplement at home.  2. Permanent atrial fibrillation: She is rate controlled and on appropriate anticoagulation therapy.  Previously there was some concern that her heart rate is elevated during the previous office visit, however repeat echocardiogram showed EF is normal at 50 to 55% and her heart rate is fairly controlled during today's visit.  3. Abnormal echocardiogram: Recent echocardiogram obtained on 01/03/2020 showed questionable restricted posterior leaflet motion with moderate to severe MS.  Previous echocardiogram never mention any history of mitral stenosis and the patient is completely asymptomatic.  On physical exam, I do not hear any heart murmur either.  The echocardiogram report was reviewed by Dr. Margaretann Loveless during today's visit who felt the patient may not have a mitral stenosis.  She recommend observation for the time being and reassessment on the next  follow-up with her.  4. CAD: Denies any recent chest pain  5. Hypertension: Blood pressure stable  6. Obstructive sleep apnea: Compliant with CPAP therapy.   Medication Adjustments/Labs and Tests Ordered: Current medicines are reviewed at length with the patient today.  Concerns regarding medicines are outlined above.  Orders Placed This Encounter  Procedures  . Basic metabolic panel  . EKG 12-Lead   No orders of the defined types were placed in this encounter.   Patient Instructions  Medication Instructions:  INCREASE LASIX 60MG  DAILY *If you need a refill on your cardiac medications before your next appointment, please call your pharmacy*  Lab Work: BMET IN 5-7 DAYS  If you have labs (blood work) drawn today and your tests are completely normal, you will receive your results only by:  [place] (if you have MyChart) OR A paper copy in the mail.  If you have any lab test that is abnormal or we need to change your treatment, we will call you to review the results. You may go to any [facility] that is convenient for you however, we do have a lab in our office that is able to assist you. You DO NOT need an appointment for our lab. The lab is open 8:00am and closes at 4:00pm. Lunch 12:45 - 1:45pm.  Follow-Up: Your next appointment: 1- 3 month(s) In Person with Cherlynn Kaiser, MD  At [facility], you and your health needs are our priority.  As part of our continuing mission to provide you with exceptional heart care, we have created designated Provider Care Teams.  These Care Teams include your primary Cardiologist (physician) and Advanced Practice Providers (APPs -  Physician Assistants and Nurse Practitioners) who all work together to provide you with the care you need,  when you need it.  We recommend signing up for the patient portal called "MyChart".  Sign up information is provided on this After Visit Summary.  MyChart is used to connect with patients for Virtual Visits  (Telemedicine).  Patients are able to view lab/test results, encounter notes, upcoming appointments, etc.  Non-urgent messages can be sent to your provider as well.   To learn more about what you can do with MyChart, go to [email or web address].       Hilbert Corrigan, [place]  01/09/2020 11:43 PM    [facility]

## 2020-01-07 NOTE — Patient Instructions (Signed)
Medication Instructions:  INCREASE LASIX 60MG  DAILY *If you need a refill on your cardiac medications before your next appointment, please call your pharmacy*  Lab Work: BMET IN 5-7 DAYS  If you have labs (blood work) drawn today and your tests are completely normal, you will receive your results only by:  [place] (if you have MyChart) OR A paper copy in the mail.  If you have any lab test that is abnormal or we need to change your treatment, we will call you to review the results. You may go to any [facility] that is convenient for you however, we do have a lab in our office that is able to assist you. You DO NOT need an appointment for our lab. The lab is open 8:00am and closes at 4:00pm. Lunch 12:45 - 1:45pm.  Follow-Up: Your next appointment: 1- 3 month(s) In Person with Cherlynn Kaiser, MD  At [facility], you and your health needs are our priority.  As part of our continuing mission to provide you with exceptional heart care, we have created designated Provider Care Teams.  These Care Teams include your primary Cardiologist (physician) and Advanced Practice Providers (APPs -  Physician Assistants and Nurse Practitioners) who all work together to provide you with the care you need, when you need it.  We recommend signing up for the patient portal called "MyChart".  Sign up information is provided on this After Visit Summary.  MyChart is used to connect with patients for Virtual Visits (Telemedicine).  Patients are able to view lab/test results, encounter notes, upcoming appointments, etc.  Non-urgent messages can be sent to your provider as well.   To learn more about what you can do with MyChart, go to [email or web address].

## 2020-01-15 DIAGNOSIS — I509 Heart failure, unspecified: Secondary | ICD-10-CM | POA: Diagnosis not present

## 2020-01-15 DIAGNOSIS — Z79899 Other long term (current) drug therapy: Secondary | ICD-10-CM | POA: Diagnosis not present

## 2020-01-16 ENCOUNTER — Other Ambulatory Visit: Payer: Self-pay

## 2020-01-16 DIAGNOSIS — E876 Hypokalemia: Secondary | ICD-10-CM

## 2020-01-16 DIAGNOSIS — I509 Heart failure, unspecified: Secondary | ICD-10-CM

## 2020-01-16 DIAGNOSIS — Z79899 Other long term (current) drug therapy: Secondary | ICD-10-CM

## 2020-01-16 LAB — BASIC METABOLIC PANEL
BUN/Creatinine Ratio: 13 (ref 12–28)
BUN: 11 mg/dL (ref 8–27)
CO2: 30 mmol/L — ABNORMAL HIGH (ref 20–29)
Calcium: 9.6 mg/dL (ref 8.7–10.3)
Chloride: 95 mmol/L — ABNORMAL LOW (ref 96–106)
Creatinine, Ser: 0.85 mg/dL (ref 0.57–1.00)
GFR calc Af Amer: 85 mL/min/{1.73_m2} (ref >59)
GFR calc non Af Amer: 74 mL/min/{1.73_m2} (ref >59)
Glucose: 78 mg/dL (ref 65–99)
Potassium: 3.4 mmol/L — ABNORMAL LOW (ref 3.5–5.2)
Sodium: 140 mmol/L (ref 134–144)

## 2020-01-22 ENCOUNTER — Other Ambulatory Visit: Payer: Self-pay | Admitting: Internal Medicine

## 2020-01-27 ENCOUNTER — Other Ambulatory Visit: Payer: Self-pay

## 2020-01-27 ENCOUNTER — Telehealth: Payer: Self-pay | Admitting: Internal Medicine

## 2020-01-27 MED ORDER — METOPROLOL TARTRATE 100 MG PO TABS: 150.0000 mg | ORAL_TABLET | Freq: Two times a day (BID) | ORAL | 3 refills | Status: DC

## 2020-01-27 NOTE — Telephone Encounter (Signed)
*  STAT* If patient is at the pharmacy, call can be transferred to refill team.   1. Which medications need to be refilled? (please list name of each medication and dose if known)  metoprolol tartrate (LOPRESSOR) 100 MG tablet(Expired)  2. Which pharmacy/location (including street and city if local pharmacy) is medication to be sent to? [place] ([place]), [place] - [address]  3. Do they need a 30 day or 90 day supply? 90 day supply    Refill sent on 01/22/20 was not received.

## 2020-01-28 ENCOUNTER — Other Ambulatory Visit: Payer: Self-pay

## 2020-01-30 DIAGNOSIS — G4733 Obstructive sleep apnea (adult) (pediatric): Secondary | ICD-10-CM | POA: Diagnosis not present

## 2020-02-17 DIAGNOSIS — Z0100 Encounter for examination of eyes and vision without abnormal findings: Secondary | ICD-10-CM | POA: Diagnosis not present

## 2020-02-17 DIAGNOSIS — H52223 Regular astigmatism, bilateral: Secondary | ICD-10-CM | POA: Diagnosis not present

## 2020-02-17 DIAGNOSIS — Z9842 Cataract extraction status, left eye: Secondary | ICD-10-CM | POA: Diagnosis not present

## 2020-02-17 DIAGNOSIS — H0288A Meibomian gland dysfunction right eye, upper and lower eyelids: Secondary | ICD-10-CM | POA: Diagnosis not present

## 2020-02-17 DIAGNOSIS — H0288B Meibomian gland dysfunction left eye, upper and lower eyelids: Secondary | ICD-10-CM | POA: Diagnosis not present

## 2020-02-17 DIAGNOSIS — H524 Presbyopia: Secondary | ICD-10-CM | POA: Diagnosis not present

## 2020-02-17 DIAGNOSIS — H5203 Hypermetropia, bilateral: Secondary | ICD-10-CM | POA: Diagnosis not present

## 2020-02-17 DIAGNOSIS — H353121 Nonexudative age-related macular degeneration, left eye, early dry stage: Secondary | ICD-10-CM | POA: Diagnosis not present

## 2020-02-17 DIAGNOSIS — Z9841 Cataract extraction status, right eye: Secondary | ICD-10-CM | POA: Diagnosis not present

## 2020-02-24 ENCOUNTER — Ambulatory Visit: Payer: Medicare HMO | Admitting: Internal Medicine

## 2020-03-09 DIAGNOSIS — L821 Other seborrheic keratosis: Secondary | ICD-10-CM | POA: Diagnosis not present

## 2020-03-09 DIAGNOSIS — D225 Melanocytic nevi of trunk: Secondary | ICD-10-CM | POA: Diagnosis not present

## 2020-03-09 DIAGNOSIS — D2261 Melanocytic nevi of right upper limb, including shoulder: Secondary | ICD-10-CM | POA: Diagnosis not present

## 2020-03-09 DIAGNOSIS — D2262 Melanocytic nevi of left upper limb, including shoulder: Secondary | ICD-10-CM | POA: Diagnosis not present

## 2020-03-17 ENCOUNTER — Other Ambulatory Visit: Payer: Self-pay | Admitting: Internal Medicine

## 2020-03-19 ENCOUNTER — Encounter: Payer: Self-pay | Admitting: Internal Medicine

## 2020-03-19 ENCOUNTER — Ambulatory Visit: Payer: Medicare HMO | Admitting: Internal Medicine

## 2020-03-19 ENCOUNTER — Other Ambulatory Visit: Payer: Self-pay

## 2020-03-19 VITALS — BP 93/69 | HR 87 | Ht 63.0 in | Wt 213.6 lb

## 2020-03-19 DIAGNOSIS — G4733 Obstructive sleep apnea (adult) (pediatric): Secondary | ICD-10-CM | POA: Diagnosis not present

## 2020-03-19 DIAGNOSIS — Z79899 Other long term (current) drug therapy: Secondary | ICD-10-CM | POA: Diagnosis not present

## 2020-03-19 DIAGNOSIS — I10 Essential (primary) hypertension: Secondary | ICD-10-CM | POA: Diagnosis not present

## 2020-03-19 DIAGNOSIS — D6869 Other thrombophilia: Secondary | ICD-10-CM | POA: Diagnosis not present

## 2020-03-19 DIAGNOSIS — I4821 Permanent atrial fibrillation: Secondary | ICD-10-CM

## 2020-03-19 DIAGNOSIS — M7989 Other specified soft tissue disorders: Secondary | ICD-10-CM | POA: Diagnosis not present

## 2020-03-19 DIAGNOSIS — I2510 Atherosclerotic heart disease of native coronary artery without angina pectoris: Secondary | ICD-10-CM

## 2020-03-19 MED ORDER — METOPROLOL TARTRATE 100 MG PO TABS: 150.0000 mg | ORAL_TABLET | Freq: Two times a day (BID) | ORAL | 3 refills | Status: DC

## 2020-03-19 MED ORDER — SPIRONOLACTONE 25 MG PO TABS
12.5000 mg | ORAL_TABLET | Freq: Every day | ORAL | 3 refills | Status: DC
Start: 2020-03-19 — End: 2020-06-20

## 2020-03-19 MED ORDER — FUROSEMIDE 40 MG PO TABS
40.0000 mg | ORAL_TABLET | Freq: Every day | ORAL | 3 refills | Status: DC
Start: 2020-03-19 — End: 2020-03-24

## 2020-03-19 MED ORDER — APIXABAN 5 MG PO TABS: 5.0000 mg | ORAL_TABLET | Freq: Two times a day (BID) | ORAL | 1 refills | Status: DC

## 2020-03-19 MED ORDER — ATORVASTATIN CALCIUM 20 MG PO TABS: 20.0000 mg | ORAL_TABLET | Freq: Every day | ORAL | 3 refills | Status: DC

## 2020-03-19 MED ORDER — DILTIAZEM HCL ER COATED BEADS 360 MG PO CP24: 360.0000 mg | ORAL_CAPSULE | Freq: Every day | ORAL | 3 refills | Status: DC

## 2020-03-19 NOTE — Progress Notes (Signed)
Cardiology Office Note:    Date:  03/19/2020   ID:  Shirlee Latch, Alferd Apa Mar 13, 1956, MRN [id number]  PCP:  Mardi Mainland, FNP  Cardiologist:  Elouise Munroe, MD  Electrophysiologist:  None   Referring MD: Mardi Mainland   Chief Complaint/Reason for Referral: HFpEF, MR, permanent AF  History of Present Illness:    Rhonda [name] is a 64 y.o. female with a history of HFpEF, coronary artery disease with MI at age 38 and PCI with associated systolic heart failure with recovery of ejection fraction.  She also has hypertension, atrial fibrillation on Eliquis, and lower extremity edema. She presents today for follow-up. Recently saw my colleage Almyra Deforest PA, and we discussed her care in my role as DOD during that office visit. At that visit on 01/07/20 she had increasing LE swelling. Felt to be multifactorial lymphedema and possible LE swelling from HFpEF. She was otherwise asymptomatic. Recent echo reported mitral stenosis, however I have independently reviewed these images and note that based on valve morphology and CW Doppler interrogation, there is no mitral stenosis. There is mitral regurgitation noted.   Her main concern is LE swelling. She is otherwise well. We discussed that increase in lasix was only mildly helpful, suggesting a stronger component of venous insufficiency and possibly lymphedema. No CP, no SOB. Mildly low BP is asymptomatic. Rates better controlled today.   Past Medical History:  Diagnosis Date  . Atrial fibrillation ([place])   . CAD (coronary artery disease) 08/2009   BMS to RCA, non-obstructive in CX and LAD 2011  . CHF (congestive heart failure) ([place])   . Edema, peripheral   . Hypertension   . Nausea   . Pneumonia    January 2020  . Sleep apnea   . Trichomoniasis 06/05/2018    Past Surgical History:  Procedure Laterality Date  . CESAREAN SECTION     x 1. no BTL  . CORONARY ANGIOPLASTY WITH STENT PLACEMENT    . DILATION AND  CURETTAGE, DIAGNOSTIC / THERAPEUTIC     for SAB  . HYSTEROSCOPY WITH D & C N/A 03/13/2019   Procedure: DILATATION AND CURETTAGE /HYSTEROSCOPY;  Surgeon: Aletha Halim, MD;  Location: [facility];  Service: Gynecology;  Laterality: N/A;    Current Medications: Current Meds  Medication Sig  . albuterol (PROVENTIL HFA;VENTOLIN HFA) 108 (90 Base) MCG/ACT inhaler Inhale 2 puffs into the lungs every 6 (six) hours as needed for wheezing or shortness of breath.  Marland Kitchen apixaban (ELIQUIS) 5 MG TABS tablet Take 1 tablet (5 mg total) by mouth 2 (two) times daily. Restart early afternoon on 03/14/2019  . atorvastatin (LIPITOR) 20 MG tablet Take 1 tablet (20 mg total) by mouth daily.  Marland Kitchen diltiazem (CARDIZEM CD) 360 MG 24 hr capsule Take 1 capsule (360 mg total) by mouth daily.  . furosemide (LASIX) 40 MG tablet Take 1 tablet (40 mg total) by mouth daily.  Marland Kitchen levocetirizine (XYZAL) 5 MG tablet SMARTSIG:1 Tablet(s) By Mouth Every Evening  . metoprolol tartrate (LOPRESSOR) 100 MG tablet Take 1.5 tablets (150 mg total) by mouth 2 (two) times daily.  Marland Kitchen spironolactone (ALDACTONE) 25 MG tablet Take 0.5 tablets (12.5 mg total) by mouth daily.     Allergies:   Ace inhibitors and Prednisone   Social History   Tobacco Use  . Smoking status: Former Smoker    Packs/day: 0.20    Types: Cigarettes  . Smokeless tobacco: Never Used  Vaping Use  . Vaping Use: Never  used  Substance Use Topics  . Alcohol use: Yes    Alcohol/week: 5.0 standard drinks    Types: 2 Cans of beer, 3 Standard drinks or equivalent per week    Comment: "drink at least 3 shots a day"  . Drug use: Yes    Types: Marijuana    Comment: nightly     Family History: The patient's family history includes CAD in her maternal grandmother and mother; Diabetes Mellitus II in her father; Hypertension in her sister. There is no history of Breast cancer.  ROS:   Please see the history of present illness.    All other systems reviewed and  are negative.  EKGs/Labs/Other Studies Reviewed:    The following studies were reviewed today:  Recent Labs: 01/15/2020: BUN 11; Creatinine, Ser 0.85; Potassium 3.4; Sodium 140  Recent Lipid Panel No results found for: CHOL, TRIG, HDL, CHOLHDL, VLDL, LDLCALC, LDLDIRECT  Physical Exam:    VS:  BP 93/69   Pulse 87   Ht 5\' 3"  (1.6 m)   Wt 213 lb 9.6 oz (96.9 kg)   SpO2 95%   BMI 37.84 kg/m     Wt Readings from Last 5 Encounters:  03/19/20 213 lb 9.6 oz (96.9 kg)  01/07/20 213 lb 6.4 oz (96.8 kg)  06/26/19 209 lb 12.8 oz (95.2 kg)  05/16/19 209 lb 12.8 oz (95.2 kg)  04/04/19 203 lb (92.1 kg)    Constitutional: No acute distress Eyes: sclera non-icteric, normal conjunctiva and lids ENMT: normal dentition, moist mucous membranes Cardiovascular: irregular rhythm, normal rate, 2/6 HSM. Respiratory: clear to auscultation bilaterally GI : normal bowel sounds, soft and nontender. No distention.   MSK: extremities warm, well perfused. 2+ bilateral edema.  NEURO: grossly nonfocal exam, moves all extremities. PSYCH: alert and oriented x 3, normal mood and affect.   ASSESSMENT:    1. Leg swelling   2. Medication management   3. Permanent atrial fibrillation ([place])   4. Secondary hypercoagulable state ([place])   5. Coronary artery disease involving native coronary artery of native heart without angina pectoris   6. Essential hypertension   7. OSA (obstructive sleep apnea)    PLAN:    Leg swelling  - Plan: Compression stockings - will write script for compression stockings. Also recommend lymphedema clinic referral though this appears to be only accomplished by primary care, I have recommended the patient discuss this with her PCP. - resume lasix 40 mg daily with lasix 20 mg prn swelling or weight gain.  Medication management- refills provided.  Permanent atrial fibrillation (HCC) Secondary hypercoagulable state ([place]) - chads2vasc score is 4. Continue eliquis 5 mg BID. No  bleeding This patients CHA2DS2-VASc Score and unadjusted Ischemic Stroke Rate (% per year) is equal to 4.8 % stroke rate/year from a score of 4 Above score calculated as 1 point each if present [CHF, HTN, DM, Vascular=MI/PAD/Aortic Plaque, Age if 65-74, or Female] Above score calculated as 2 points each if present [Age > 75, or Stroke/TIA/TE] -rate controlled today on diltiazem 360 mg and metop tartrate 150 mg BID, continue.   Coronary artery disease involving native coronary artery of native heart without angina pectoris - on eliquis. No asa currently. - continue BB, atorvastatin 20 mg daily. Consider PCSK9I if intolerant of higher dose statin. Review at follow up.  Essential hypertension - continue diltiazem, metoprolol and spironolactone. Continue lasix.  OSA (obstructive sleep apnea) - needs to follow up with sleep medicine.  Total time of encounter: 33 minutes total  time of encounter, including 20 minutes spent in face-to-face patient care on the date of this encounter. This time includes coordination of care and counseling regarding above mentioned problem list. Remainder of non-face-to-face time involved reviewing chart documents/testing relevant to the patient encounter and documentation in the medical record. I have independently reviewed documentation from referring provider.   Cherlynn Kaiser, MD [place]  [facility]    Medication Adjustments/Labs and Tests Ordered: Current medicines are reviewed at length with the patient today.  Concerns regarding medicines are outlined above.   Orders Placed This Encounter  Procedures  . Compression stockings    Meds ordered this encounter  Medications  . apixaban (ELIQUIS) 5 MG TABS tablet    Sig: Take 1 tablet (5 mg total) by mouth 2 (two) times daily. Restart early afternoon on 03/14/2019    Dispense:  180 tablet    Refill:  1  . atorvastatin (LIPITOR) 20 MG tablet    Sig: Take 1 tablet (20 mg total) by mouth daily.     Dispense:  90 tablet    Refill:  3  . diltiazem (CARDIZEM CD) 360 MG 24 hr capsule    Sig: Take 1 capsule (360 mg total) by mouth daily.    Dispense:  90 capsule    Refill:  3  . DISCONTD: furosemide (LASIX) 40 MG tablet    Sig: Take 1 tablet (40 mg total) by mouth daily.    Dispense:  90 tablet    Refill:  3  . metoprolol tartrate (LOPRESSOR) 100 MG tablet    Sig: Take 1.5 tablets (150 mg total) by mouth 2 (two) times daily.    Dispense:  135 tablet    Refill:  3  . spironolactone (ALDACTONE) 25 MG tablet    Sig: Take 0.5 tablets (12.5 mg total) by mouth daily.    Dispense:  45 tablet    Refill:  3    Patient Instructions  Medication Instructions:  No Changes In Medications at this time.  *If you need a refill on your cardiac medications before your next appointment, please call your pharmacy*  Lab Work: None Ordered At This Time.  If you have labs (blood work) drawn today and your tests are completely normal, you will receive your results only by: Marland Kitchen MyChart Message (if you have MyChart) OR . A paper copy in the mail If you have any lab test that is abnormal or we need to change your treatment, we will call you to review the results.  Testing/Procedures: None Ordered At This Time.   Follow-Up: At [facility], you and your health needs are our priority.  As part of our continuing mission to provide you with exceptional heart care, we have created designated Provider Care Teams.  These Care Teams include your primary Cardiologist (physician) and Advanced Practice Providers (APPs -  Physician Assistants and Nurse Practitioners) who all work together to provide you with the care you need, when you need it.   Your next appointment:   3 month(s)  The format for your next appointment:   In Person  Provider:   Cherlynn Kaiser, MD  Other Instructions DR. Francheska Villeda WOULD LIKE FOR YOU TO WEAR COMPRESSION STOCKINGS. INFORMATION SHEET GIVEN WITH ADDRESS AND CONTACT INFORMATION  TO OBTAIN THESE. YOU WILL NEED 15-50mmhg. IF YOU HAVE ANY ISSUES GIVE OUR OFFICE A CALL [phone]

## 2020-03-19 NOTE — Patient Instructions (Signed)
Medication Instructions:  No Changes In Medications at this time.  *If you need a refill on your cardiac medications before your next appointment, please call your pharmacy*  Lab Work: None Ordered At This Time.  If you have labs (blood work) drawn today and your tests are completely normal, you will receive your results only by: Marland Kitchen MyChart Message (if you have MyChart) OR . A paper copy in the mail If you have any lab test that is abnormal or we need to change your treatment, we will call you to review the results.  Testing/Procedures: None Ordered At This Time.   Follow-Up: At [facility], you and your health needs are our priority.  As part of our continuing mission to provide you with exceptional heart care, we have created designated Provider Care Teams.  These Care Teams include your primary Cardiologist (physician) and Advanced Practice Providers (APPs -  Physician Assistants and Nurse Practitioners) who all work together to provide you with the care you need, when you need it.   Your next appointment:   3 month(s)  The format for your next appointment:   In Person  Provider:   Cherlynn Kaiser, MD  Other Instructions DR. ACHARYA WOULD LIKE FOR YOU TO WEAR COMPRESSION STOCKINGS. INFORMATION SHEET GIVEN WITH ADDRESS AND CONTACT INFORMATION TO OBTAIN THESE. YOU WILL NEED 15-22mmhg. IF YOU HAVE ANY ISSUES GIVE OUR OFFICE A CALL [phone]

## 2020-03-24 ENCOUNTER — Other Ambulatory Visit: Payer: Self-pay

## 2020-03-24 MED ORDER — FUROSEMIDE 40 MG PO TABS: 40.0000 mg | ORAL_TABLET | Freq: Every day | ORAL | 3 refills | Status: DC

## 2020-03-26 ENCOUNTER — Telehealth: Payer: Self-pay | Admitting: Internal Medicine

## 2020-03-26 MED ORDER — FUROSEMIDE 40 MG PO TABS: 40.0000 mg | ORAL_TABLET | Freq: Every day | ORAL | 3 refills | Status: DC

## 2020-03-26 NOTE — Telephone Encounter (Signed)
Pt called to report that she saw Dr. Margaretann Loveless on 03/19/20 and she had recommended that she increase her lasix to 60 mg a day and she needs an RX sent to her pharmacy... I advised her that I will need to check with Dr. Margaretann Loveless and be sure that is the dose she wants her to stay on... pt says that her edema is much improved and she has been wearing her new compression stockings daily.   Pt also asking about a referral to a "therapist" for her feet that she spoke with Dr. Margaretann Loveless about... she tried to get an appt with her PCP to ask her about it but she cannot get in with her until 05/09/20 so she asking if we can facilitate that referral for her so she does not have to wait.    Will forward to Dr. Margaretann Loveless for her review.

## 2020-03-26 NOTE — Telephone Encounter (Signed)
Spoke with Dr. Margaretann Loveless in clinic regarding patients lasix dosage. Per Dr. Margaretann Loveless patient should go back to 40mg  dosage of lasix daily and may take an extra 20mg  daily as needed for shortness of breath, swelling or weight gain.   Spoke with patient, advised patient of Dr. Delphina Cahill recommendation and advised patient to let us know if she starts having any issues. Patient verbalized understanding.   Prescription for lasix 40mg  daily refilled and sent in to patient preferred pharmacy.

## 2020-03-26 NOTE — Telephone Encounter (Signed)
Called and spoke with patient regarding lasix prescription. Our records indicate patient has been taking 40mg  of lasix daily but patient has been taking 60mg . Patient states that her edema in her legs is better and she has lost 6 pounds recently. Patient does state she has occasional shortness of breath with exertion. Patient states that her blood pressure today is 86/60 with HR in the 80's and has been running in that range the last several days. Patient denies any other symptoms at this time. Advised patient I would speak with Dr. Margaretann Loveless regarding lasix dosage at this time. Patient also asking about referral to lymphedema clinic as mentioned in last office visit. Per Dr. Margaretann Loveless patient needs to reach out to PCP regarding this referral. Advised patient I would forward message to Dr. Margaretann Loveless for review. Patient verbalized understanding.

## 2020-03-26 NOTE — Telephone Encounter (Signed)
Patient would like to talk with Dr. Margaretann Loveless or nurse. Please call

## 2020-05-07 ENCOUNTER — Telehealth: Payer: Self-pay | Admitting: Internal Medicine

## 2020-05-07 DIAGNOSIS — Z23 Encounter for immunization: Secondary | ICD-10-CM | POA: Diagnosis not present

## 2020-05-07 DIAGNOSIS — I509 Heart failure, unspecified: Secondary | ICD-10-CM | POA: Diagnosis not present

## 2020-05-07 DIAGNOSIS — Z79899 Other long term (current) drug therapy: Secondary | ICD-10-CM

## 2020-05-07 DIAGNOSIS — J449 Chronic obstructive pulmonary disease, unspecified: Secondary | ICD-10-CM | POA: Diagnosis not present

## 2020-05-07 NOTE — Telephone Encounter (Signed)
Rhonda Blade, Np called from the patient's PCP office. The patient was currently there with a 10 pound weight gain (current weight 218) in one week, shortness of breath, and +3 pitting edema. Her blood pressure was 112/77. The patient was currently taking Furosemide 40 mg once daily and spironolactone 12.5 mg once daily.   Per DOD, Dr. Sallyanne Kuster, the patient may take Furosemide 80 mg once daily and spironolactone 25 mg through the weekend. She is to call the office on Monday with an update. If she does not see any changes, she has been advised that she may call the on call provider over the weekend as well.

## 2020-05-13 NOTE — Telephone Encounter (Signed)
Returned call to patient to follow up. Patient states that she is slightly better than when she was in PCP office this past Friday. Patient states she took the 80 mg of furosemide and 25 mg of spironolactone over the weekend and went back to normal dosing yesterday. Patient states that her weight is currently 215 lb which she states is still the same per her home scale. Patient states that the swelling in her right leg and foot is a little better but her left leg and foot is still very swollen. Patient denies pain, redness or warmness to the touch to the left lower extremity. Patient states she is still slightly short of breath worse with exertion but states that it is slightly better than Friday at office visit with PCP. Patient states her blood pressure today is 96/57 with heart rate of 92. Patient states she is going to wear her compression stockings and states that she does notice the swelling is better when wearing these. Patient denies any other symptoms at this time and is not audibly short of breath on the phone.   Advised patient I would speak with Dr. Margaretann Loveless in regards to recommendations, possible lab work, and bringing patient in to be seen sooner than 1/19 appointment.

## 2020-05-13 NOTE — Telephone Encounter (Signed)
Spoke with Dr. Margaretann Loveless who would like to go ahead and order BMET. Also would like for patient to come in to be seen by an APP.   Returned call to patient. Advised patient to return to office this week to get blood work drawn for [employer]. Went over instructions for lab. Patient verbalized understanding.   Made patient an appointment to see Almyra Deforest PA-C on 12/16 at 3:15pm.   Advised patient to call back to office if any thing changes.   Patient verbalized understanding of all instructions.

## 2020-05-13 NOTE — Telephone Encounter (Signed)
Attempted to call patient to follow up. Unable to reach patient. Left message for patient to return call to the office when able.

## 2020-05-13 NOTE — Telephone Encounter (Signed)
[name] is returning Jenna's call. Please advise.

## 2020-05-14 ENCOUNTER — Other Ambulatory Visit: Payer: Self-pay

## 2020-05-14 DIAGNOSIS — Z79899 Other long term (current) drug therapy: Secondary | ICD-10-CM | POA: Diagnosis not present

## 2020-05-14 DIAGNOSIS — E876 Hypokalemia: Secondary | ICD-10-CM

## 2020-05-14 DIAGNOSIS — I509 Heart failure, unspecified: Secondary | ICD-10-CM

## 2020-05-15 LAB — BASIC METABOLIC PANEL
BUN/Creatinine Ratio: 12 (ref 12–28)
BUN: 11 mg/dL (ref 8–27)
CO2: 31 mmol/L — ABNORMAL HIGH (ref 20–29)
Calcium: 9.6 mg/dL (ref 8.7–10.3)
Chloride: 95 mmol/L — ABNORMAL LOW (ref 96–106)
Creatinine, Ser: 0.94 mg/dL (ref 0.57–1.00)
GFR calc Af Amer: 75 mL/min/{1.73_m2} (ref >59)
GFR calc non Af Amer: 65 mL/min/{1.73_m2} (ref >59)
Glucose: 116 mg/dL — ABNORMAL HIGH (ref 65–99)
Potassium: 3.8 mmol/L (ref 3.5–5.2)
Sodium: 138 mmol/L (ref 134–144)

## 2020-05-21 ENCOUNTER — Ambulatory Visit: Payer: Medicare HMO | Admitting: Physician Assistant

## 2020-05-26 NOTE — Telephone Encounter (Signed)
Spoke with Dr. Margaretann Loveless. Patient advised to take 80mg  of Lasix through Friday, resume 40mg  on Saturday. Patient to leave spironolactone at 12.5mg . Patient will call back on Monday to update staff with weight and symptoms.

## 2020-05-26 NOTE — Telephone Encounter (Signed)
Spoke with patient. She reports she weighs 223lbs today, same as yesterday. She reports due to the shortness of breath and swelling she took 80mg  of lasix yesterday and today. She took 25mg  of spironolactone yesterday and 12.5mg  of spironolactone today. She reports she is urinating more and her shortness of breath has improved. She wants to know if she should continue the current dose of lasix and spironolactone or stay on the increased dose. Will route to MD for review.   BP 109/67, HR 64

## 2020-06-01 DIAGNOSIS — G4733 Obstructive sleep apnea (adult) (pediatric): Secondary | ICD-10-CM | POA: Diagnosis not present

## 2020-06-01 NOTE — Telephone Encounter (Signed)
Attempted to call patient, left message for patient to return call to office. Will send patient a MyChart message as well to see how she is feeling.

## 2020-06-01 NOTE — Telephone Encounter (Signed)
Spoke with patient who states that her swelling is slightly better with the 40mg  of lasix compared to the 20mg . Patient states that she is now able to get her shoes on. Patient states that her shortness of breath is intermittent with exertion. Patient states that her weight has remained the same. Patient states that her blood pressure today was 117/74 with heart rate of 74.   Spoke with Dr. who states that patient can remain on 40mg  lasix until her appointment with Dr. 1/4. Per Dr. Jacques Navy patient can take 80mg  lasix if patient very uncomfortable.   Advised patient of Dr. recommendations. Advised patient to continue to monitor weight daily as well as blood pressure and heart rate. Advised patient to call back to office if there are any issues, questions, or concerns. Advised patient to keep follow up appointment with Dr. Jacques Navy scheduled 1/4 at 3:40pm.   Patient verbalized understanding of all instructions.

## 2020-06-03 DIAGNOSIS — G4733 Obstructive sleep apnea (adult) (pediatric): Secondary | ICD-10-CM | POA: Diagnosis not present

## 2020-06-09 ENCOUNTER — Ambulatory Visit: Payer: Medicare HMO | Admitting: Internal Medicine

## 2020-06-09 ENCOUNTER — Encounter: Payer: Self-pay | Admitting: Internal Medicine

## 2020-06-09 ENCOUNTER — Other Ambulatory Visit: Payer: Self-pay

## 2020-06-09 VITALS — BP 138/72 | HR 59 | Ht 63.0 in | Wt 225.0 lb

## 2020-06-09 DIAGNOSIS — I10 Essential (primary) hypertension: Secondary | ICD-10-CM | POA: Diagnosis not present

## 2020-06-09 DIAGNOSIS — Z79899 Other long term (current) drug therapy: Secondary | ICD-10-CM

## 2020-06-09 DIAGNOSIS — R188 Other ascites: Secondary | ICD-10-CM

## 2020-06-09 DIAGNOSIS — Z01812 Encounter for preprocedural laboratory examination: Secondary | ICD-10-CM | POA: Diagnosis not present

## 2020-06-09 DIAGNOSIS — G4733 Obstructive sleep apnea (adult) (pediatric): Secondary | ICD-10-CM | POA: Diagnosis not present

## 2020-06-09 DIAGNOSIS — I340 Nonrheumatic mitral (valve) insufficiency: Secondary | ICD-10-CM | POA: Diagnosis not present

## 2020-06-09 DIAGNOSIS — I5033 Acute on chronic diastolic (congestive) heart failure: Secondary | ICD-10-CM | POA: Diagnosis not present

## 2020-06-09 DIAGNOSIS — I4811 Longstanding persistent atrial fibrillation: Secondary | ICD-10-CM

## 2020-06-09 DIAGNOSIS — R600 Localized edema: Secondary | ICD-10-CM | POA: Diagnosis not present

## 2020-06-09 MED ORDER — TORSEMIDE 20 MG PO TABS: 40.0000 mg | ORAL_TABLET | Freq: Every day | ORAL | 3 refills | Status: DC

## 2020-06-09 NOTE — Patient Instructions (Addendum)
Medication Instructions:  STOP: LASIX START: TORSEMIDE- 40mg  DAILY  *If you need a refill on your cardiac medications before your next appointment, please call your pharmacy*  Lab Work: CMP and CBC- TODAY  If you have labs (blood work) drawn today and your tests are completely normal, you will receive your results only by: MyChart Message (if you have MyChart) OR . A paper copy in the mail If you have any lab test that is abnormal or we need to change your treatment, we will call you to review the results.  Testing/Procedures: Your physician has requested that you have a TEE. During a TEE, sound waves are used to create images of your heart. It provides your doctor with information about the size and shape of your heart and how well your heart's chambers and valves are working. In this test, a transducer is attached to the end of a flexible tube that's guided down your throat and into your esophagus (the tube leading from you mouth to your stomach) to get a more detailed image of your heart. You are not awake for the procedure. Please see the instruction sheet given to you today. For further information please visit Marland Kitchen.  Follow-Up: At [facility], you and your health needs are our priority.  As part of our continuing mission to provide you with exceptional heart care, we have created designated Provider Care Teams.  These Care Teams include your primary Cardiologist (physician) and Advanced Practice Providers (APPs -  Physician Assistants and Nurse Practitioners) who all work together to provide you with the care you need, when you need it.  Your next appointment:   KEEP FOLLOW UP APPOINTMENT 06/24/20  The format for your next appointment:   In Person  Provider:   06/26/20, MD  Other Instructions Dear Rhonda [name],   You are scheduled for a TEE on Wednesday January 12th with Dr. January 14.  Please arrive at the [facility] (Main Entrance A) at [facility]: [address] [place], [place] at 9:30 am. (1 hour prior to procedure unless lab work is needed; if lab work is needed arrive 1.5 hours ahead)  DIET: Nothing to eat or drink after midnight except a sip of water with medications (see medication instructions below)  Medication Instructions: Continue your anticoagulant: Eliquis You will need to continue your anticoagulant after your procedure until you  are told by your  Provider that it is safe to stop  You must have a responsible person to drive you home and stay in the waiting area during your procedure. Failure to do so could result in cancellation.  Bring your insurance cards.  *Special Note: Every effort is made to have your procedure done on time. Occasionally there are emergencies that occur at the hospital that may cause delays. Please be patient if a delay does occur.

## 2020-06-09 NOTE — Progress Notes (Signed)
Cardiology Office Note:    Date:  06/09/2020   ID:  Rhonda Rhonda 10-08-1955, MRN [id number]  PCP:  Rhonda Rhonda, Octavia  Cardiologist:  Rhonda Munroe, MD  Electrophysiologist:  None   Referring MD: Rhonda Rhonda   Chief Complaint/Reason for Referral: LE swelling and ascites, atrial fibrillation  History of Present Illness:    Rhonda Rhonda is a 65 y.o. female with a history of HFpEF, coronary artery disease with MI at age 49 and PCI with associated systolic heart failure with recovery of ejection fraction.  She also has hypertension, atrial fibrillation on Eliquis, and lower extremity edema. She presents today for follow-up. Recently saw my colleage Rhonda Deforest PA on 01/07/20 where she had increasing LE swelling. Felt to be multifactorial lymphedema and possible LE swelling from HFpEF. She was otherwise asymptomatic. Recent echo reported mitral stenosis, however I have independently reviewed these images and note that based on valve morphology and CW Doppler interrogation, there is no mitral stenosis. There is mitral regurgitation noted, and on my review it is at least moderate-severe, with some splay artifact, suggesting possible severe MR. The mechanism may be posterior leaflet restriction. LV function is normal while in atrial fibrillation at that time.   I last saw her on 03/19/20 with continued LE swelling. Initially I was concerned for lymphedema, and we have been trying to get her set up with lymphedema clinic to no avail for leg wrapping. She has had a modest response to increasing doses of diuretic. She will go for labs today at the end of our visit. She has been taking 80 mg of lasix without benefit, and now has evidence of ascites with bendopnea and inability to button pants, she also has some early satiety.  Denies chest pain. She does have DOE that is worsening. No palpitations. I suspected she had permanent AF however today she appears to be in  sinus bradycardia.   With new finding of ascites, she notes that she has a previously heavy alcohol use history but not currently.   I am very concerned about her today and have expressed my wish that she have a low threshold to go to the ED.    Past Medical History:  Diagnosis Date   Atrial fibrillation ([place])    CAD (coronary artery disease) 08/2009   BMS to RCA, non-obstructive in CX and LAD 2011   CHF (congestive heart failure) ([facility])    Edema, peripheral    Hypertension    Nausea    Pneumonia    January 2020   Sleep apnea    Trichomoniasis 06/05/2018    Past Surgical History:  Procedure Laterality Date   CESAREAN SECTION     x 1. no BTL   CORONARY ANGIOPLASTY WITH STENT PLACEMENT     DILATION AND CURETTAGE, DIAGNOSTIC / THERAPEUTIC     for SAB   HYSTEROSCOPY WITH D & C N/A 03/13/2019   Procedure: DILATATION AND CURETTAGE /HYSTEROSCOPY;  Surgeon: Aletha Halim, MD;  Location: [facility];  Service: Gynecology;  Laterality: N/A;    Current Medications: Current Meds  Medication Sig   albuterol (PROVENTIL HFA;VENTOLIN HFA) 108 (90 Base) MCG/ACT inhaler Inhale 2 puffs into the lungs every 6 (six) hours as needed for wheezing or shortness of breath.   apixaban (ELIQUIS) 5 MG TABS tablet Take 1 tablet (5 mg total) by mouth 2 (two) times daily. Restart early afternoon on 03/14/2019   atorvastatin (LIPITOR) 20 MG tablet Take 1  tablet (20 mg total) by mouth daily.   diltiazem (CARDIZEM CD) 360 MG 24 hr capsule Take 1 capsule (360 mg total) by mouth daily.   furosemide (LASIX) 40 MG tablet Take 1 tablet (40 mg total) by mouth daily.   levocetirizine (XYZAL) 5 MG tablet SMARTSIG:1 Tablet(s) By Mouth Every Evening   metoprolol tartrate (LOPRESSOR) 100 MG tablet Take 1.5 tablets (150 mg total) by mouth 2 (two) times daily.   spironolactone (ALDACTONE) 25 MG tablet Take 0.5 tablets (12.5 mg total) by mouth daily.     Allergies:   Ace inhibitors  and Prednisone   Social History   Tobacco Use   Smoking status: Former Smoker    Packs/day: 0.20    Types: Cigarettes   Smokeless tobacco: Never Used  Vaping Use   Vaping Use: Never used  Substance Use Topics   Alcohol use: Yes    Alcohol/[name]: 5.0 Rhonda Rhonda    Types: 2 Cans of beer, 3 Rhonda Rhonda    Comment: "drink at least 3 shots a day"   Drug use: Yes    Types: Marijuana    Comment: nightly     Family History: The patient's family history includes CAD in her maternal grandmother and mother; Diabetes Mellitus II in her father; Hypertension in her sister. There is no history of Breast cancer.  ROS:   Please see the history of present illness.    All other systems reviewed and are negative.  EKGs/Labs/Other Studies Reviewed:    The following studies were reviewed today:  EKG:  Sinus bradycardia rate 59, anterior infarct pattern/poor R wave progression.  Recent Labs: 05/14/2020: BUN 11; Creatinine, Ser 0.94; Potassium 3.8; Sodium 138  Recent Lipid Panel No results found for: CHOL, TRIG, HDL, CHOLHDL, VLDL, LDLCALC, LDLDIRECT  Physical Exam:    VS:  BP 138/72    Pulse (!) 59    Ht 5\' 3"  (1.6 m)    Wt 225 lb (102.1 kg)    SpO2 91%    BMI 39.86 kg/m     Wt Readings from Last 5 Encounters:  06/09/20 225 lb (102.1 kg)  03/19/20 213 lb 9.6 oz (96.9 kg)  01/07/20 213 lb 6.4 oz (96.8 kg)  06/26/19 209 lb 12.8 oz (95.2 kg)  05/16/19 209 lb 12.8 oz (95.2 kg)    Constitutional: No acute distress Eyes: sclera non-icteric, normal conjunctiva and lids ENMT: normal dentition, moist mucous membranes Cardiovascular: regular rhythm, normal rate, 3/6 HSM in axilla. V wave noted in JVP sitting upright, JVP just above clavicle. Respiratory: clear to auscultation bilaterally GI : distended but not tense, nontender MSK: extremities warm. 2+ edema.  NEURO: grossly nonfocal exam, moves all extremities. PSYCH: alert and oriented x 3, normal mood  and affect.   ASSESSMENT:    1. Ascites   2. Lower extremity edema   3. Pre-procedure lab exam   4. Essential hypertension   5. Medication management   6. Acute on chronic diastolic congestive heart failure ([place])   7. Longstanding persistent atrial fibrillation ([place])   8. Nonrheumatic mitral valve regurgitation   9. OSA (obstructive sleep apnea)    PLAN:    Ascites, new LE Edema - worsening Acute on chronic diastolic HF - she is having more difficulty with volume management and has ascites, which is worrisome for worsening HF. She is hemodynamically stable at the moment and warm on exam, we can attempt to optimize at home and will start torsemide instead of lasix  for better bioavailability. Torsemide 40 mg daily.  -I am concerned about about hepatic dysfunction with a history of heavy alcohol use. Recommend ASAP GI referral.   AF - presumed permanent but she appears to be in sinus at the moment. Will monitor closely. Can continue Eliquis for now.  MR - reviewing her echo, I suspect severe MR however will need to quantitate further with better imaging. Will perform TEE.   ADDENDUM 06/15/20:  I have been following lab work on Rhonda Rhonda closely over the last [name]. One initial concern I had was for pericardial constriction given history of prior MI and diuretic resistant diastolic heart failure. Initial plan was for R/L HC. However, with a significant drop in hemoglobin, currently 9.2 and previously normal, and INR 1.4, my concern for a hepatic/GI etiology for her decompensation has come to the forefront. It may not be safe at this time to do a TEE (for concern of GI bleed or esophageal varices) and may not be the appropriate time for RHC given likely need for aggressive volume management.   I have discussed in detail with Bea Laura RN and we have been in contact with the patient. Recommend presentation to the ED for admission to medicine service and consultation with cardiology and  GI. I am quite concerned about her at this point.   Weston Brass, MD [place]   [facility]    Medication Adjustments/Labs and Tests Ordered: Current medicines are reviewed at length with the patient today.  Concerns regarding medicines are outlined above.   Orders Placed This Encounter  Procedures   Comprehensive metabolic panel   CBC   Ambulatory referral to Gastroenterology   EKG 12-Lead     Meds ordered this encounter  Medications   torsemide (DEMADEX) 20 MG tablet    Sig: Take 2 tablets (40 mg total) by mouth daily.    Dispense:  180 tablet    Refill:  3    Patient Instructions  Medication Instructions:  STOP: LASIX START: TORSEMIDE- 40mg  DAILY  *If you need a refill on your cardiac medications before your next appointment, please call your pharmacy*  Lab Work: CMP and CBC- TODAY  If you have labs (blood work) drawn today and your tests are completely normal, you will receive your results only by:  MyChart Message (if you have MyChart) OR  A paper copy in the mail If you have any lab test that is abnormal or we need to change your treatment, we will call you to review the results.  Testing/Procedures: Your physician has requested that you have a TEE. During a TEE, sound waves are used to create images of your heart. It provides your doctor with information about the size and shape of your heart and how well your hearts chambers and valves are working. In this test, a transducer is attached to the end of a flexible tube thats guided down your throat and into your esophagus (the tube leading from you mouth to your stomach) to get a more detailed image of your heart. You are not awake for the procedure. Please see the instruction sheet given to you today. For further information please visit .  Follow-Up: At [facility], you and your health needs are our priority.  As part of our continuing mission to provide you with exceptional  heart care, we have created designated Provider Care Teams.  These Care Teams include your primary Cardiologist (physician) and Advanced Practice Providers (APPs -  Physician Assistants and Nurse  Practitioners) who all work together to provide you with the care you need, when you need it.  Your next appointment:   KEEP FOLLOW UP APPOINTMENT 06/24/20  The format for your next appointment:   In Person  Provider:   Cherlynn Kaiser, MD  Other Instructions Dear Rhonda Rhonda,   You are scheduled for a TEE on Wednesday January 12th with Dr. Margaretann Loveless.  Please arrive at the [facility] (Main Entrance A) at [facility]: [address] [place],  [address] at 9:30 am. (1 hour prior to procedure unless lab work is needed; if lab work is needed arrive 1.5 hours ahead)  DIET: Nothing to eat or drink after midnight except a sip of water with medications (see medication instructions below)  Medication Instructions: Continue your anticoagulant: Eliquis You will need to continue your anticoagulant after your procedure until you  are told by your  Provider that it is safe to stop  You must have a responsible person to drive you home and stay in the waiting area during your procedure. Failure to do so could result in cancellation.  Bring your insurance cards.  *Special Note: Every effort is made to have your procedure done on time. Occasionally there are emergencies that occur at the hospital that may cause delays. Please be patient if a delay does occur.

## 2020-06-10 LAB — COMPREHENSIVE METABOLIC PANEL
ALT: 10 IU/L (ref 0–32)
AST: 13 IU/L (ref 0–40)
Albumin/Globulin Ratio: 1.4 (ref 1.2–2.2)
Albumin: 3.8 g/dL (ref 3.8–4.8)
Alkaline Phosphatase: 92 IU/L (ref 44–121)
BUN/Creatinine Ratio: 12 (ref 12–28)
BUN: 20 mg/dL (ref 8–27)
Bilirubin Total: 2.7 mg/dL — ABNORMAL HIGH (ref 0.0–1.2)
CO2: 30 mmol/L — ABNORMAL HIGH (ref 20–29)
Calcium: 9.6 mg/dL (ref 8.7–10.3)
Chloride: 90 mmol/L — ABNORMAL LOW (ref 96–106)
Creatinine, Ser: 1.65 mg/dL — ABNORMAL HIGH (ref 0.57–1.00)
GFR calc Af Amer: 38 mL/min/{1.73_m2} — ABNORMAL LOW (ref >59)
GFR calc non Af Amer: 33 mL/min/{1.73_m2} — ABNORMAL LOW (ref >59)
Globulin, Total: 2.7 g/dL (ref 1.5–4.5)
Glucose: 120 mg/dL — ABNORMAL HIGH (ref 65–99)
Potassium: 3.8 mmol/L (ref 3.5–5.2)
Sodium: 136 mmol/L (ref 134–144)
Total Protein: 6.5 g/dL (ref 6.0–8.5)

## 2020-06-10 LAB — CBC
Hematocrit: 29.2 % — ABNORMAL LOW (ref 34.0–46.6)
Hemoglobin: 9.2 g/dL — ABNORMAL LOW (ref 11.1–15.9)
MCH: 27.1 pg (ref 26.6–33.0)
MCHC: 31.5 g/dL (ref 31.5–35.7)
MCV: 86 fL (ref 79–97)
Platelets: 209 10*3/uL (ref 150–450)
RBC: 3.39 x10E6/uL — ABNORMAL LOW (ref 3.77–5.28)
RDW: 14.6 % (ref 11.7–15.4)
WBC: 9.1 10*3/uL (ref 3.4–10.8)

## 2020-06-11 ENCOUNTER — Telehealth: Payer: Self-pay

## 2020-06-11 DIAGNOSIS — R188 Other ascites: Secondary | ICD-10-CM

## 2020-06-11 DIAGNOSIS — I509 Heart failure, unspecified: Secondary | ICD-10-CM

## 2020-06-11 DIAGNOSIS — D649 Anemia, unspecified: Secondary | ICD-10-CM

## 2020-06-11 NOTE — Telephone Encounter (Signed)
Attempted to call patient x2 today to go over recent lab results and the following instructions from Dr. Jacques Navy. Left messages to call back to office when able. MyChart message sent to patient as well.    Parke Poisson, MD        Creatinine is elevated from prior, this may be cardiorenal physiology or may be due to increased diuretic use. Would recommend rechecking labs on Friday and reviewing at that time what the plan will need to be going forward with diuretics. When she comes in for labs on Friday, please order BMET, BNP, and PT/INR.   Unexplained new anemia - please expedite GI referral - concern is possible liver dysfunction vs luminal etiology for anemia.  Please cancel her TEE until GI eval is complete. Please order a TTE instead, first available (as soon as possible).  With new ascites and signs of worsening diastolic HF, we will need to evaluate for constriction and reevaluate coronary arteries. Please arrange for [facility] on Wed the 12th with Dr. Excell Seltzer if available.

## 2020-06-12 ENCOUNTER — Telehealth: Payer: Self-pay

## 2020-06-12 ENCOUNTER — Encounter: Payer: Self-pay | Admitting: Internal Medicine

## 2020-06-12 ENCOUNTER — Encounter: Payer: Self-pay | Admitting: Physician Assistant

## 2020-06-12 DIAGNOSIS — R188 Other ascites: Secondary | ICD-10-CM | POA: Diagnosis not present

## 2020-06-12 DIAGNOSIS — I509 Heart failure, unspecified: Secondary | ICD-10-CM | POA: Diagnosis not present

## 2020-06-12 NOTE — Telephone Encounter (Signed)
Spoke with pt, aware of lab results. Orders for labs placed and patient aware to get those drawn today if possible. TEE cancelled. Order placed for echo and urgent referral to GI. She will get her covid testing Monday as scheduled. R&L cath scheduled for Wednesday 06/17/20 @ 4 pm with dr cooper. The cath lab will call back if time needs to be changed. Patient will need to be called by dr Margaretann Loveless on Tuesday to discuss the risks of cardiac craterization. The patient will also need to be called with instructions regarding cath.

## 2020-06-12 NOTE — Addendum Note (Signed)
Addended by: Cristopher Estimable on: 06/12/2020 12:57 PM   Modules accepted: Orders

## 2020-06-12 NOTE — Telephone Encounter (Signed)
Received a call from Santiago Glad in Cardiac Cath lab calling to let Dr.Acharya know she had to change cath to [place] instead of Dr.Cooper on 06/17/20..Stated Dr.Cooper already has 4 caths scheduled. Patient will have to move to another day if Dr.Acharya wants Dr.Cooper.Message sent to Dr.Acharya and her RN.Patient was called and made aware.Stated she is scheduled for covid test 06/15/20.

## 2020-06-12 NOTE — Telephone Encounter (Signed)
Pt returning call

## 2020-06-13 LAB — BASIC METABOLIC PANEL
BUN/Creatinine Ratio: 18 (ref 12–28)
BUN: 29 mg/dL — ABNORMAL HIGH (ref 8–27)
CO2: 30 mmol/L — ABNORMAL HIGH (ref 20–29)
Calcium: 9.9 mg/dL (ref 8.7–10.3)
Chloride: 92 mmol/L — ABNORMAL LOW (ref 96–106)
Creatinine, Ser: 1.65 mg/dL — ABNORMAL HIGH (ref 0.57–1.00)
GFR calc Af Amer: 38 mL/min/{1.73_m2} — ABNORMAL LOW (ref >59)
GFR calc non Af Amer: 33 mL/min/{1.73_m2} — ABNORMAL LOW (ref >59)
Glucose: 117 mg/dL — ABNORMAL HIGH (ref 65–99)
Potassium: 4 mmol/L (ref 3.5–5.2)
Sodium: 138 mmol/L (ref 134–144)

## 2020-06-13 LAB — PROTIME-INR
INR: 1.4 — ABNORMAL HIGH (ref 0.9–1.2)
Prothrombin Time: 14 s — ABNORMAL HIGH (ref 9.1–12.0)

## 2020-06-13 LAB — PRO B NATRIURETIC PEPTIDE: NT-Pro BNP: 732 pg/mL — ABNORMAL HIGH (ref 0–287)

## 2020-06-15 ENCOUNTER — Telehealth: Payer: Self-pay

## 2020-06-15 ENCOUNTER — Encounter (HOSPITAL_COMMUNITY): Payer: Self-pay | Admitting: Emergency Medicine

## 2020-06-15 ENCOUNTER — Emergency Department (HOSPITAL_COMMUNITY): Payer: Medicare HMO

## 2020-06-15 ENCOUNTER — Inpatient Hospital Stay (HOSPITAL_COMMUNITY)
Admission: EM | Admit: 2020-06-15 | Discharge: 2020-06-20 | DRG: 291 | Disposition: A | Discharge status: Home / Self Care | Payer: Medicare HMO | Attending: Internal Medicine | Admitting: Internal Medicine

## 2020-06-15 ENCOUNTER — Other Ambulatory Visit: Payer: Self-pay

## 2020-06-15 ENCOUNTER — Other Ambulatory Visit (HOSPITAL_COMMUNITY)
Admission: RE | Admit: 2020-06-15 | Discharge: 2020-06-15 | Disposition: A | Discharge status: Home / Self Care | Payer: Medicare HMO | Source: Ambulatory Visit | Attending: Cardiovascular Disease | Admitting: Cardiovascular Disease

## 2020-06-15 DIAGNOSIS — Z7289 Other problems related to lifestyle: Secondary | ICD-10-CM

## 2020-06-15 DIAGNOSIS — F1020 Alcohol dependence, uncomplicated: Secondary | ICD-10-CM | POA: Diagnosis present

## 2020-06-15 DIAGNOSIS — I4821 Permanent atrial fibrillation: Secondary | ICD-10-CM | POA: Diagnosis present

## 2020-06-15 DIAGNOSIS — I110 Hypertensive heart disease with heart failure: Principal | ICD-10-CM | POA: Diagnosis present

## 2020-06-15 DIAGNOSIS — D509 Iron deficiency anemia, unspecified: Secondary | ICD-10-CM | POA: Diagnosis present

## 2020-06-15 DIAGNOSIS — F17210 Nicotine dependence, cigarettes, uncomplicated: Secondary | ICD-10-CM | POA: Diagnosis present

## 2020-06-15 DIAGNOSIS — E876 Hypokalemia: Secondary | ICD-10-CM | POA: Diagnosis present

## 2020-06-15 DIAGNOSIS — R0602 Shortness of breath: Secondary | ICD-10-CM | POA: Diagnosis not present

## 2020-06-15 DIAGNOSIS — I509 Heart failure, unspecified: Secondary | ICD-10-CM

## 2020-06-15 DIAGNOSIS — K7031 Alcoholic cirrhosis of liver with ascites: Secondary | ICD-10-CM | POA: Diagnosis present

## 2020-06-15 DIAGNOSIS — Z79899 Other long term (current) drug therapy: Secondary | ICD-10-CM

## 2020-06-15 DIAGNOSIS — U071 COVID-19: Secondary | ICD-10-CM | POA: Diagnosis not present

## 2020-06-15 DIAGNOSIS — I5033 Acute on chronic diastolic (congestive) heart failure: Secondary | ICD-10-CM | POA: Diagnosis present

## 2020-06-15 DIAGNOSIS — R011 Cardiac murmur, unspecified: Secondary | ICD-10-CM | POA: Diagnosis present

## 2020-06-15 DIAGNOSIS — Z888 Allergy status to other drugs, medicaments and biological substances status: Secondary | ICD-10-CM

## 2020-06-15 DIAGNOSIS — Z01812 Encounter for preprocedural laboratory examination: Secondary | ICD-10-CM | POA: Insufficient documentation

## 2020-06-15 DIAGNOSIS — Z955 Presence of coronary angioplasty implant and graft: Secondary | ICD-10-CM

## 2020-06-15 DIAGNOSIS — Z20822 Contact with and (suspected) exposure to covid-19: Secondary | ICD-10-CM | POA: Insufficient documentation

## 2020-06-15 DIAGNOSIS — R195 Other fecal abnormalities: Secondary | ICD-10-CM | POA: Diagnosis present

## 2020-06-15 DIAGNOSIS — D689 Coagulation defect, unspecified: Secondary | ICD-10-CM | POA: Diagnosis not present

## 2020-06-15 DIAGNOSIS — I081 Rheumatic disorders of both mitral and tricuspid valves: Secondary | ICD-10-CM | POA: Diagnosis present

## 2020-06-15 DIAGNOSIS — Z789 Other specified health status: Secondary | ICD-10-CM

## 2020-06-15 DIAGNOSIS — R040 Epistaxis: Secondary | ICD-10-CM | POA: Diagnosis not present

## 2020-06-15 DIAGNOSIS — E8770 Fluid overload, unspecified: Secondary | ICD-10-CM

## 2020-06-15 DIAGNOSIS — I272 Pulmonary hypertension, unspecified: Secondary | ICD-10-CM | POA: Diagnosis present

## 2020-06-15 DIAGNOSIS — Z8249 Family history of ischemic heart disease and other diseases of the circulatory system: Secondary | ICD-10-CM

## 2020-06-15 DIAGNOSIS — I255 Ischemic cardiomyopathy: Secondary | ICD-10-CM | POA: Diagnosis present

## 2020-06-15 DIAGNOSIS — G4733 Obstructive sleep apnea (adult) (pediatric): Secondary | ICD-10-CM | POA: Diagnosis present

## 2020-06-15 DIAGNOSIS — I517 Cardiomegaly: Secondary | ICD-10-CM | POA: Diagnosis not present

## 2020-06-15 DIAGNOSIS — N179 Acute kidney failure, unspecified: Secondary | ICD-10-CM | POA: Diagnosis present

## 2020-06-15 DIAGNOSIS — I493 Ventricular premature depolarization: Secondary | ICD-10-CM | POA: Diagnosis present

## 2020-06-15 DIAGNOSIS — Z7901 Long term (current) use of anticoagulants: Secondary | ICD-10-CM

## 2020-06-15 DIAGNOSIS — I2510 Atherosclerotic heart disease of native coronary artery without angina pectoris: Secondary | ICD-10-CM | POA: Diagnosis present

## 2020-06-15 LAB — CBC
HCT: 29.6 % — ABNORMAL LOW (ref 36.0–46.0)
Hemoglobin: 8.8 g/dL — ABNORMAL LOW (ref 12.0–15.0)
MCH: 26.5 pg (ref 26.0–34.0)
MCHC: 29.7 g/dL — ABNORMAL LOW (ref 30.0–36.0)
MCV: 89.2 fL (ref 80.0–100.0)
Platelets: 255 10*3/uL (ref 150–400)
RBC: 3.32 MIL/uL — ABNORMAL LOW (ref 3.87–5.11)
RDW: 16.6 % — ABNORMAL HIGH (ref 11.5–15.5)
WBC: 7.8 10*3/uL (ref 4.0–10.5)
nRBC: 0 % (ref 0.0–0.2)

## 2020-06-15 LAB — BASIC METABOLIC PANEL
Anion gap: 12 (ref 5–15)
BUN: 33 mg/dL — ABNORMAL HIGH (ref 8–23)
CO2: 31 mmol/L (ref 22–32)
Calcium: 9.3 mg/dL (ref 8.9–10.3)
Chloride: 91 mmol/L — ABNORMAL LOW (ref 98–111)
Creatinine, Ser: 1.76 mg/dL — ABNORMAL HIGH (ref 0.44–1.00)
GFR, Estimated: 32 mL/min — ABNORMAL LOW (ref >60)
Glucose, Bld: 131 mg/dL — ABNORMAL HIGH (ref 70–99)
Potassium: 3.3 mmol/L — ABNORMAL LOW (ref 3.5–5.1)
Sodium: 134 mmol/L — ABNORMAL LOW (ref 135–145)

## 2020-06-15 LAB — PROTIME-INR
INR: 1.9 — ABNORMAL HIGH (ref 0.8–1.2)
Prothrombin Time: 21.1 seconds — ABNORMAL HIGH (ref 11.4–15.2)

## 2020-06-15 LAB — BRAIN NATRIURETIC PEPTIDE: B Natriuretic Peptide: 609 pg/mL — ABNORMAL HIGH (ref 0.0–100.0)

## 2020-06-15 NOTE — Telephone Encounter (Signed)
Agree with documentation from Rexanne Mano, RN.  I am concerned about worsening volume overload and decreasing response to diuretics. I think she will need IV diuresis and close monitoring of urine output.   She reports a prior history of heavy alcohol use. She now has ascites by exam last week. I am concerned about hepatic dysfunction (elevated INR and Tbili), whether this is primary liver dysfunction vs secondary from worsening heart failure (HFpEF) and hepatic congestion. She reports itching all over. She will eventually need an evaluation for pericardial constriction when she is better compensated and we can ensure she is not at a high bleeding risk for cath.   She has recently developed acute anemia, and may have signs of synthetic dysfunction with an INR of 1.4. Of note, she does take Eliquis for persistant atrial fibrillation and I have reviewed briefly with CV pharmacist that it is possible that the INR may be slightly elevated in the setting of use of Eliquis.   Strongly recommend ED presentation, with admission to Internal Medicine and recommend both cardiology and GI consultations on presentation to ED.  Patient has acknowledged and will present to ED per J.Bullins RN conversation.

## 2020-06-15 NOTE — Telephone Encounter (Addendum)
Cancelled R/L Heart Cath- per Dr. Marisa Hua and spoke with patient- patient made aware of labs and recommendations from Dr. Margaretann Loveless. Patient to see GI on Jan. 28th. Patient states that she has been increasingly swollen, with a large decrease in urine output despite 40 mg of torsemide. Patient states that she is no longer sleeping and is having a hard time getting around due to the increase in swelling and shortness of breath with exertion. Patient states that she is also itching all over her body and is having trouble sleeping due to that as well.   Reviewed with Dr. Margaretann Loveless patient is currently taking Eliquis. Per Dr. Margaretann Loveless- would like for patient to hold Eliquis. Dr Margaretann Loveless also recommends patient report to the ED for evaluation .  Patient made aware and will hold Eliquis and also will report to the ED for evaluation.   Will forward to MD to make aware.   ----- Message from Rhonda Munroe, MD sent at 06/15/2020  5:02 PM EST ----- BNP is elevated.  Cr elevated but stable from a few days ago.  INR is elevated despite no anticoagulation suggesting probable hepatic dysfunction - continue with GI referral as soon as possible.  Please cancel right and left heart cath until GI is able to evaluate. She should have a low threshold for hospital presentation (to ED) if SOB, increasing swelling, or any concerns for lightheaded/dizzy or bleeding.  Patient needs acute PCP follow up as well.  I have tried to CC PCP but I am unable to do so.

## 2020-06-15 NOTE — Telephone Encounter (Signed)
Please see telephone encounter from 1/10. Cath cancelled.

## 2020-06-15 NOTE — ED Triage Notes (Signed)
Pt sent by cards for further eval of fluid volume overload, pt c/o increased swelling to abdomen/lower extremities. Work of breathing increased, decreased output. Pt is on Eliquis.

## 2020-06-16 ENCOUNTER — Inpatient Hospital Stay (HOSPITAL_COMMUNITY): Payer: Medicare HMO

## 2020-06-16 DIAGNOSIS — R011 Cardiac murmur, unspecified: Secondary | ICD-10-CM | POA: Diagnosis present

## 2020-06-16 DIAGNOSIS — F1020 Alcohol dependence, uncomplicated: Secondary | ICD-10-CM | POA: Diagnosis present

## 2020-06-16 DIAGNOSIS — Z7289 Other problems related to lifestyle: Secondary | ICD-10-CM | POA: Diagnosis not present

## 2020-06-16 DIAGNOSIS — E8770 Fluid overload, unspecified: Secondary | ICD-10-CM | POA: Diagnosis not present

## 2020-06-16 DIAGNOSIS — I493 Ventricular premature depolarization: Secondary | ICD-10-CM | POA: Diagnosis present

## 2020-06-16 DIAGNOSIS — D689 Coagulation defect, unspecified: Secondary | ICD-10-CM | POA: Diagnosis not present

## 2020-06-16 DIAGNOSIS — I255 Ischemic cardiomyopathy: Secondary | ICD-10-CM | POA: Diagnosis present

## 2020-06-16 DIAGNOSIS — Z955 Presence of coronary angioplasty implant and graft: Secondary | ICD-10-CM | POA: Diagnosis not present

## 2020-06-16 DIAGNOSIS — I340 Nonrheumatic mitral (valve) insufficiency: Secondary | ICD-10-CM

## 2020-06-16 DIAGNOSIS — I509 Heart failure, unspecified: Secondary | ICD-10-CM

## 2020-06-16 DIAGNOSIS — I4821 Permanent atrial fibrillation: Secondary | ICD-10-CM | POA: Diagnosis not present

## 2020-06-16 DIAGNOSIS — R195 Other fecal abnormalities: Secondary | ICD-10-CM | POA: Diagnosis not present

## 2020-06-16 DIAGNOSIS — I2510 Atherosclerotic heart disease of native coronary artery without angina pectoris: Secondary | ICD-10-CM | POA: Diagnosis present

## 2020-06-16 DIAGNOSIS — U071 COVID-19: Secondary | ICD-10-CM | POA: Diagnosis not present

## 2020-06-16 DIAGNOSIS — Z79899 Other long term (current) drug therapy: Secondary | ICD-10-CM | POA: Diagnosis not present

## 2020-06-16 DIAGNOSIS — I272 Pulmonary hypertension, unspecified: Secondary | ICD-10-CM | POA: Diagnosis present

## 2020-06-16 DIAGNOSIS — R040 Epistaxis: Secondary | ICD-10-CM | POA: Diagnosis not present

## 2020-06-16 DIAGNOSIS — F17210 Nicotine dependence, cigarettes, uncomplicated: Secondary | ICD-10-CM | POA: Diagnosis present

## 2020-06-16 DIAGNOSIS — I5033 Acute on chronic diastolic (congestive) heart failure: Secondary | ICD-10-CM

## 2020-06-16 DIAGNOSIS — K8020 Calculus of gallbladder without cholecystitis without obstruction: Secondary | ICD-10-CM | POA: Diagnosis not present

## 2020-06-16 DIAGNOSIS — R188 Other ascites: Secondary | ICD-10-CM | POA: Diagnosis not present

## 2020-06-16 DIAGNOSIS — G4733 Obstructive sleep apnea (adult) (pediatric): Secondary | ICD-10-CM | POA: Diagnosis present

## 2020-06-16 DIAGNOSIS — D649 Anemia, unspecified: Secondary | ICD-10-CM | POA: Diagnosis not present

## 2020-06-16 DIAGNOSIS — Z8249 Family history of ischemic heart disease and other diseases of the circulatory system: Secondary | ICD-10-CM | POA: Diagnosis not present

## 2020-06-16 DIAGNOSIS — I4819 Other persistent atrial fibrillation: Secondary | ICD-10-CM

## 2020-06-16 DIAGNOSIS — K7031 Alcoholic cirrhosis of liver with ascites: Secondary | ICD-10-CM | POA: Diagnosis present

## 2020-06-16 DIAGNOSIS — I081 Rheumatic disorders of both mitral and tricuspid valves: Secondary | ICD-10-CM | POA: Diagnosis not present

## 2020-06-16 DIAGNOSIS — Z7901 Long term (current) use of anticoagulants: Secondary | ICD-10-CM | POA: Diagnosis not present

## 2020-06-16 DIAGNOSIS — N179 Acute kidney failure, unspecified: Secondary | ICD-10-CM

## 2020-06-16 DIAGNOSIS — I110 Hypertensive heart disease with heart failure: Secondary | ICD-10-CM | POA: Diagnosis not present

## 2020-06-16 DIAGNOSIS — E876 Hypokalemia: Secondary | ICD-10-CM | POA: Diagnosis present

## 2020-06-16 DIAGNOSIS — D509 Iron deficiency anemia, unspecified: Secondary | ICD-10-CM | POA: Diagnosis not present

## 2020-06-16 DIAGNOSIS — R69 Illness, unspecified: Secondary | ICD-10-CM | POA: Diagnosis not present

## 2020-06-16 LAB — COMPREHENSIVE METABOLIC PANEL
ALT: 18 U/L (ref 0–44)
AST: 22 U/L (ref 15–41)
Albumin: 3.4 g/dL — ABNORMAL LOW (ref 3.5–5.0)
Alkaline Phosphatase: 79 U/L (ref 38–126)
Anion gap: 12 (ref 5–15)
BUN: 31 mg/dL — ABNORMAL HIGH (ref 8–23)
CO2: 31 mmol/L (ref 22–32)
Calcium: 9.4 mg/dL (ref 8.9–10.3)
Chloride: 93 mmol/L — ABNORMAL LOW (ref 98–111)
Creatinine, Ser: 1.63 mg/dL — ABNORMAL HIGH (ref 0.44–1.00)
GFR, Estimated: 35 mL/min — ABNORMAL LOW (ref >60)
Glucose, Bld: 106 mg/dL — ABNORMAL HIGH (ref 70–99)
Potassium: 3.3 mmol/L — ABNORMAL LOW (ref 3.5–5.1)
Sodium: 136 mmol/L (ref 135–145)
Total Bilirubin: 2.3 mg/dL — ABNORMAL HIGH (ref 0.3–1.2)
Total Protein: 6.5 g/dL (ref 6.5–8.1)

## 2020-06-16 LAB — HEPATITIS A ANTIBODY, IGM: Hep A IgM: NONREACTIVE

## 2020-06-16 LAB — HEPATITIS B SURFACE ANTIGEN: Hepatitis B Surface Ag: NONREACTIVE

## 2020-06-16 LAB — IRON AND TIBC
Iron: 27 ug/dL — ABNORMAL LOW (ref 28–170)
Saturation Ratios: 6 % — ABNORMAL LOW (ref 10.4–31.8)
TIBC: 477 ug/dL — ABNORMAL HIGH (ref 250–450)
UIBC: 450 ug/dL

## 2020-06-16 LAB — RETICULOCYTES
Immature Retic Fract: 28.2 % — ABNORMAL HIGH (ref 2.3–15.9)
RBC.: 3.42 MIL/uL — ABNORMAL LOW (ref 3.87–5.11)
Retic Count, Absolute: 79.3 10*3/uL (ref 19.0–186.0)
Retic Ct Pct: 2.3 % (ref 0.4–3.1)

## 2020-06-16 LAB — LACTATE DEHYDROGENASE: LDH: 134 U/L (ref 98–192)

## 2020-06-16 LAB — MAGNESIUM: Magnesium: 1.9 mg/dL (ref 1.7–2.4)

## 2020-06-16 LAB — RESP PANEL BY RT-PCR (FLU A&B, COVID) ARPGX2
Influenza A by PCR: NEGATIVE
Influenza B by PCR: NEGATIVE
SARS Coronavirus 2 by RT PCR: POSITIVE — AB

## 2020-06-16 LAB — APTT: aPTT: 55 seconds — ABNORMAL HIGH (ref 24–36)

## 2020-06-16 LAB — HEPATITIS C ANTIBODY: HCV Ab: NONREACTIVE

## 2020-06-16 LAB — HIV ANTIBODY (ROUTINE TESTING W REFLEX): HIV Screen 4th Generation wRfx: NONREACTIVE

## 2020-06-16 LAB — FOLATE: Folate: 9.6 ng/mL (ref >5.9)

## 2020-06-16 LAB — VITAMIN B12: Vitamin B-12: 623 pg/mL (ref 180–914)

## 2020-06-16 LAB — SARS CORONAVIRUS 2 (TAT 6-24 HRS): SARS Coronavirus 2: NEGATIVE

## 2020-06-16 LAB — HEMOGLOBIN AND HEMATOCRIT, BLOOD
HCT: 29.2 % — ABNORMAL LOW (ref 36.0–46.0)
Hemoglobin: 9 g/dL — ABNORMAL LOW (ref 12.0–15.0)

## 2020-06-16 LAB — FERRITIN: Ferritin: 12 ng/mL (ref 11–307)

## 2020-06-16 MED ORDER — FUROSEMIDE 10 MG/ML IJ SOLN
40.0000 mg | Freq: Two times a day (BID) | INTRAMUSCULAR | Status: DC
  Filled 2020-06-16: qty 4

## 2020-06-16 MED ORDER — POTASSIUM CHLORIDE CRYS ER 20 MEQ PO TBCR
40.0000 meq | EXTENDED_RELEASE_TABLET | Freq: Once | ORAL | Status: AC
  Administered 2020-06-16: 40 meq via ORAL
  Filled 2020-06-16: qty 2

## 2020-06-16 MED ORDER — ALBUTEROL SULFATE HFA 108 (90 BASE) MCG/ACT IN AERS
2.0000 | INHALATION_SPRAY | Freq: Four times a day (QID) | RESPIRATORY_TRACT | Status: DC | PRN
  Filled 2020-06-16: qty 6.7

## 2020-06-16 MED ORDER — FUROSEMIDE 10 MG/ML IJ SOLN
60.0000 mg | Freq: Once | INTRAMUSCULAR | Status: AC
  Administered 2020-06-16: 60 mg via INTRAVENOUS
  Filled 2020-06-16: qty 6

## 2020-06-16 MED ORDER — HEPARIN BOLUS VIA INFUSION
4000.0000 [IU] | Freq: Once | INTRAVENOUS | Status: AC
  Administered 2020-06-16: 4000 [IU] via INTRAVENOUS
  Filled 2020-06-16: qty 4000

## 2020-06-16 MED ORDER — APIXABAN 5 MG PO TABS: 5.0000 mg | ORAL_TABLET | Freq: Two times a day (BID) | ORAL | Status: DC

## 2020-06-16 MED ORDER — DILTIAZEM HCL 60 MG PO TABS
60.0000 mg | ORAL_TABLET | Freq: Four times a day (QID) | ORAL | Status: DC
  Administered 2020-06-16 – 2020-06-17 (×4): 60 mg via ORAL
  Filled 2020-06-16 (×7): qty 1

## 2020-06-16 MED ORDER — HEPARIN (PORCINE) 25000 UT/250ML-% IV SOLN
1250.0000 [IU]/h | INTRAVENOUS | Status: DC
  Administered 2020-06-16: 1100 [IU]/h via INTRAVENOUS
  Administered 2020-06-17 (×2): 1250 [IU]/h via INTRAVENOUS
  Filled 2020-06-16 (×3): qty 250

## 2020-06-16 MED ORDER — METOPROLOL TARTRATE 100 MG PO TABS
100.0000 mg | ORAL_TABLET | Freq: Two times a day (BID) | ORAL | Status: DC
  Administered 2020-06-16 – 2020-06-20 (×6): 100 mg via ORAL
  Filled 2020-06-16 (×8): qty 1
  Filled 2020-06-16: qty 4

## 2020-06-16 MED ORDER — FUROSEMIDE 10 MG/ML IJ SOLN: 20.0000 mg | Freq: Two times a day (BID) | INTRAMUSCULAR | Status: DC

## 2020-06-16 NOTE — Progress Notes (Signed)
ANTICOAGULATION CONSULT NOTE - Follow Up Consult  Pharmacy Consult for IV heparin Indication: atrial fibrillation  Allergies  Allergen Reactions  . Ace Inhibitors Cough  . Prednisone Itching and Swelling    Itching on leg and arms swelling from knee to ankles and felt restless    Patient Measurements: Height: 5\' 3"  (160 cm) Weight: 103.3 kg (227 lb 11.8 oz) IBW/kg (Calculated) : 52.4 Heparin Dosing Weight: 76.5 kg  Vital Signs: Temp: 98.2 F (36.8 C) (01/11 2040) Temp Source: Oral (01/11 2040) BP: 115/58 (01/11 2231) Pulse Rate: 98 (01/11 2231)  Labs: Recent Labs    06/15/20 2215 06/16/20 1240 06/16/20 2128  HGB 8.8* 9.0*  --   HCT 29.6* 29.2*  --   PLT 255  --   --   APTT  --   --  55*  LABPROT 21.1*  --   --   INR 1.9*  --   --   CREATININE 1.76* 1.63*  --     Estimated Creatinine Clearance: 41.1 mL/min (A) (by C-G formula based on SCr of 1.63 mg/dL (H)).   Medical History: Past Medical History:  Diagnosis Date  . Atrial fibrillation ([place])   . CAD (coronary artery disease) 08/2009   BMS to RCA, non-obstructive in CX and LAD 2011  . CHF (congestive heart failure) ([place])   . Edema, peripheral   . Hypertension   . Nausea   . Pneumonia    January 2020  . Sleep apnea   . Trichomoniasis 06/05/2018    Medications:  Medications Prior to Admission  Medication Sig Dispense Refill Last Dose  . albuterol (PROVENTIL HFA;VENTOLIN HFA) 108 (90 Base) MCG/ACT inhaler Inhale 2 puffs into the lungs every 6 (six) hours as needed for wheezing or shortness of breath. 1 Inhaler 2 06/15/2020 at Unknown time  . apixaban (ELIQUIS) 5 MG TABS tablet Take 1 tablet (5 mg total) by mouth 2 (two) times daily. Restart early afternoon on 03/14/2019 (Patient taking differently: Take 5 mg by mouth 2 (two) times daily.) 180 tablet 1 06/15/2020 at 0900  . atorvastatin (LIPITOR) 20 MG tablet Take 1 tablet (20 mg total) by mouth daily. (Patient taking differently: Take 20 mg by mouth at  bedtime.) 90 tablet 3 06/14/2020  . diltiazem (CARDIZEM CD) 360 MG 24 hr capsule Take 1 capsule (360 mg total) by mouth daily. 90 capsule 3 06/15/2020 at Unknown time  . levocetirizine (XYZAL) 5 MG tablet Take 5 mg by mouth every evening.   06/14/2020  . metoprolol tartrate (LOPRESSOR) 100 MG tablet Take 1.5 tablets (150 mg total) by mouth 2 (two) times daily. (Patient taking differently: Take 100-150 mg by mouth See admin instructions. 150mg  in the am 100mg  at bedtime.) 135 tablet 3 06/15/2020 at 0900  . spironolactone (ALDACTONE) 25 MG tablet Take 0.5 tablets (12.5 mg total) by mouth daily. 45 tablet 3 06/15/2020 at Unknown time  . torsemide (DEMADEX) 20 MG tablet Take 2 tablets (40 mg total) by mouth daily. 180 tablet 3 06/15/2020 at Unknown time    Assessment: 65 year old female with PMH of heart failure, CAD with prior MI, Afib on Eliquis, hx of heavy alcohol use, and lower extremity edema, admitted for heart failure exacerbation. Pharmacy consulted to start IV heparin for atrial fibrillation.   Hgb low at 9.0 but improved/stable from 8.8 yesterday, platelets wnl. Per chart review, considering GI consult for concerns of cirrhosis and anemia. No s/sx bleeding reported.  Prior to admission Eliquis 5 mg BID for afib,  last dose reported yesterday 1/10 at 0900.   Anticipate heparin level will be falsely elevated, will adjust doses based on aPTT until heparin level correlates with aPTT.  Heparin drip 1100 uts/hr aptt 55sec < goal.    Goal of Therapy:  Heparin level 0.3-0.7 units/ml aPTT 66-102 seconds Monitor platelets by anticoagulation protocol: Yes   Plan:  Increase  heparin infusion at 1100 units/hr  Daily aPTT, heparin level, CBC Monitor s/sx bleeding   Bonnita Nasuti Pharm.D. CPP, BCPS Clinical Pharmacist [phone] 06/16/2020 10:48 PM   Please see AMION for all pharmacy numbers

## 2020-06-16 NOTE — Consult Note (Addendum)
Cardiology Consultation:   Patient ID: Rhonda [name] MRN: [id number]; DOB: 31-Aug-1955  Admit date: 06/15/2020 Date of Consult: 06/16/2020  Primary Care Provider: Mardi [name], [facility] Cardiologist: Rhonda Munroe, MD  [place] Electrophysiologist:  None   Patient Profile:   Rhonda [name] is a 65 y.o. female with a hx of chronic systolic and diastolic heart failure, CAD with prior MI, HTN, Afib on eliquis, hx of heavy alcohol use, and lower extremity edema who is being seen today for the evaluation of volume overload at the request of Dr. Roslynn [name].  History of Present Illness:   Rhonda [name] has been followed by Dr. Margaretann [name] and was seen in clinic 06/09/20. She has a history of ischemic cardiomyopathy following MI at age 77 (BMS to RCA, nonobstructive CAD in Cx and LAD - 2011). She fortunately regained function and EF is now low-normal at 50-55%, with presumed diastolic dysfunction. She saw Rhonda [name] in August 2021 with lower extremity swelling felt to be multifactorial due to HFpEF and lymphedema. Echocardiogram with EF 50-55% and no mitral stenosis, but moderate to severe mitral regurgitation (see Dr. Delphina [name] note dated 06/09/20 - MR likely severe, question posterior leaflet restriction).  She was seen again 03/19/20 with ongoing leg edema. Only modest response to lasix 80 mg. Referral to lymphedema clinic for leg wrapping. She was seen again by Dr. Margaretann [name] 06/09/20. she had ascites and bendopnea with increased abdominal girth and weight gain. She also reported DOE. She has persistent Afib, but was in sinus bradycardia at that visit.  Dr. Margaretann [name] adjusted her diuretic to torsemide with plans to evaluate possible pericardial constriction with R/L HC and TEE to examine MR. However, labs since that visit have continued to worsen and there was concern for hepatic dysfunction. She was sent to the ER for further evaluation.   On arrival: INR elevated to  1.9 sCr elevated to 1.79 Hb 8.8 BNP 609 K 3.3  Pt was seen in abdominal ultrasound. She reports a 25 lb weight gain over the last month. She has noted increased fluid over the past 2-3 weeks, including lower extremity swelling, abdominal swelling, and orthopnea. She is primarily short of breath during exertion and feels better when sitting up rather than lying flat. She denies chest pain. She has greatly reduced smoking behavior to 1 cigarette daily. She has a history of heavy alcohol use, reporting as much as a fifth of liquor daily for about 1.5 years. She has an 38yr period of abstinence, but other times has been drinking one pint daily.   Past Medical History:  Diagnosis Date  . Atrial fibrillation ([place])   . CAD (coronary artery disease) 08/2009   BMS to RCA, non-obstructive in CX and LAD 2011  . CHF (congestive heart failure) ([place])   . Edema, peripheral   . Hypertension   . Nausea   . Pneumonia    January 2020  . Sleep apnea   . Trichomoniasis 06/05/2018    Past Surgical History:  Procedure Laterality Date  . CESAREAN SECTION     x 1. no BTL  . CORONARY ANGIOPLASTY WITH STENT PLACEMENT    . DILATION AND CURETTAGE, DIAGNOSTIC / THERAPEUTIC     for SAB  . HYSTEROSCOPY WITH D & C N/A 03/13/2019   Procedure: DILATATION AND CURETTAGE /HYSTEROSCOPY;  Surgeon: Rhonda Halim, MD;  Location: [facility];  Service: Gynecology;  Laterality: N/A;     Home Medications:  Prior to Admission medications  Medication Sig Start Date End Date Taking? Authorizing Provider  albuterol (PROVENTIL HFA;VENTOLIN HFA) 108 (90 Base) MCG/ACT inhaler Inhale 2 puffs into the lungs every 6 (six) hours as needed for wheezing or shortness of breath. 06/24/18  Yes Rhonda Lesches, MD  apixaban (ELIQUIS) 5 MG TABS tablet Take 1 tablet (5 mg total) by mouth 2 (two) times daily. Restart early afternoon on 03/14/2019 Patient taking differently: Take 5 mg by mouth 2 (two) times daily. 03/19/20   Yes Rhonda Munroe, MD  atorvastatin (LIPITOR) 20 MG tablet Take 1 tablet (20 mg total) by mouth daily. Patient taking differently: Take 20 mg by mouth at bedtime. 03/19/20  Yes Rhonda Munroe, MD  diltiazem (CARDIZEM CD) 360 MG 24 hr capsule Take 1 capsule (360 mg total) by mouth daily. 03/19/20  Yes Rhonda Munroe, MD  levocetirizine (XYZAL) 5 MG tablet Take 5 mg by mouth every evening. 12/13/19  Yes [provider]  metoprolol tartrate (LOPRESSOR) 100 MG tablet Take 1.5 tablets (150 mg total) by mouth 2 (two) times daily. Patient taking differently: Take 100-150 mg by mouth See admin instructions. 150mg  in the am 100mg  at bedtime. 03/19/20 06/17/20 Yes Rhonda Munroe, MD  spironolactone (ALDACTONE) 25 MG tablet Take 0.5 tablets (12.5 mg total) by mouth daily. 03/19/20  Yes Rhonda Munroe, MD  torsemide (DEMADEX) 20 MG tablet Take 2 tablets (40 mg total) by mouth daily. 06/09/20 09/07/20 Yes Rhonda Munroe, MD    Inpatient Medications: Scheduled Meds: . apixaban  5 mg Oral BID   Continuous Infusions:  PRN Meds:   Allergies:    Allergies  Allergen Reactions  . Ace Inhibitors Cough  . Prednisone Itching and Swelling    Itching on leg and arms swelling from knee to ankles and felt restless    Social History:   Social History   Socioeconomic History  . Marital status: Divorced    Spouse name: Not on file  . Number of children: Not on file  . Years of education: Not on file  . Highest education level: Not on file  Occupational History  . Not on file  Tobacco Use  . Smoking status: Former Smoker    Packs/day: 0.20    Types: Cigarettes  . Smokeless tobacco: Never Used  Vaping Use  . Vaping Use: Never used  Substance and Sexual Activity  . Alcohol use: Yes    Alcohol/week: 5.0 standard drinks    Types: 2 Cans of beer, 3 Standard drinks or equivalent per week    Comment: "drink at least 3 shots a day"  . Drug use: Yes    Types: Marijuana     Comment: nightly  . Sexual activity: Yes    Birth control/protection: Post-menopausal  Other Topics Concern  . Not on file  Social History Narrative  . Not on file   Social Determinants of Health   Financial Resource Strain: Not on file  Food Insecurity: Not on file  Transportation Needs: Not on file  Physical Activity: Not on file  Stress: Not on file  Social Connections: Not on file  Intimate Partner Violence: Not on file    Family History:    Family History  Problem Relation Age of Onset  . CAD Mother   . Diabetes Mellitus II Father   . Hypertension Sister   . CAD Maternal Grandmother   . Breast cancer Neg Hx      ROS:  Please see the history of present illness.  All other ROS reviewed and negative.     Physical Exam/Data:   Vitals:   06/16/20 0911 06/16/20 1039 06/16/20 1218 06/16/20 1238  BP: (!) 103/59 (!) 91/52 98/63 (!) 91/55  Pulse: (!) 55 (!) 55 74 90  Resp: 17 17 17 18   Temp: 97.9 F (36.6 C)     TempSrc: Oral     SpO2: 94% 90% 94% 92%  Weight:      Height:       No intake or output data in the 24 hours ending 06/16/20 1347 Last 3 Weights 06/15/2020 06/09/2020 03/19/2020  Weight (lbs) 224 lb 13.9 oz 225 lb 213 lb 9.6 oz  Weight (kg) 102 kg 102.059 kg 96.888 kg     Body mass index is 39.83 kg/m.  General:  Mildly obese female in NAD HEENT: normal Neck: + JVD Vascular: No carotid bruits  Cardiac:  normal S1, S2; RRR; no murmur  Lungs:  Respirations unlabored, diminished in bases Abd: distended, consistent with ascites Ext: 3+ B LE edema up through abdomen and back Musculoskeletal:  No deformities, BUE and BLE strength normal and equal Skin: warm and dry  Neuro:  CNs 2-12 intact, no focal abnormalities noted Psych:  Normal affect   EKG:  The EKG was personally reviewed and demonstrates:  Atrial fibrillation with ventricular rate 57, PVC Telemetry:  Telemetry was personally reviewed and demonstrates:    Relevant CV Studies:  Echo  pending   Echo 01/03/20: 1. Inferior basal hypokinesis. Left ventricular ejection fraction, by  estimation, is 50 to 55%. The left ventricle has low normal function. The  left ventricle demonstrates regional wall motion abnormalities (see  scoring diagram/findings for  description). The left ventricular internal cavity size was mildly  dilated. Left ventricular diastolic parameters are indeterminate.  2. Right ventricular systolic function is normal. The right ventricular  size is normal. There is moderately elevated pulmonary artery systolic  pressure.  3. Left atrial size was severely dilated.  4. Right atrial size was moderately dilated.  5. MR eccentric posterior ? from atrial enlargement and type 3A  restricted posterior leaflet motion. Consider TEE for further assess  severity if clinically indicated . The mitral valve is normal in  structure. Moderate mitral valve regurgitation.  Moderate to severe mitral stenosis.  6. Tricuspid valve regurgitation is moderate.  7. The aortic valve is normal in structure. Aortic valve regurgitation is  not visualized. No aortic stenosis is present.  8. The inferior vena cava is normal in size with greater than 50%  respiratory variability, suggesting right atrial pressure of 3 mmHg.    Laboratory Data:  High Sensitivity Troponin:  No results for input(s): TROPONINIHS in the last 720 hours.   Chemistry Recent Labs  Lab 06/09/20 1630 06/12/20 1454 06/15/20 2215  NA 136 138 134*  K 3.8 4.0 3.3*  CL 90* 92* 91*  CO2 30* 30* 31  GLUCOSE 120* 117* 131*  BUN 20 29* 33*  CREATININE 1.65* 1.65* 1.76*  CALCIUM 9.6 9.9 9.3  GFRNONAA 33* 33* 32*  GFRAA 38* 38*  --   ANIONGAP  --   --  12    Recent Labs  Lab 06/09/20 1630  PROT 6.5  ALBUMIN 3.8  AST 13  ALT 10  ALKPHOS 92  BILITOT 2.7*   Hematology Recent Labs  Lab 06/09/20 1630 06/15/20 2215 06/16/20 1240  WBC 9.1 7.8  --   RBC 3.39* 3.32*  --   HGB 9.2* 8.8* 9.0*   HCT  29.2* 29.6* 29.2*  MCV 86 89.2  --   MCH 27.1 26.5  --   MCHC 31.5 29.7*  --   RDW 14.6 16.6*  --   PLT 209 255  --    BNP Recent Labs  Lab 06/12/20 1454 06/15/20 2215  BNP  --  609.0*  PROBNP 732*  --     DDimer No results for input(s): DDIMER in the last 168 hours.   Radiology/Studies:  DG Chest 2 View  Result Date: 06/15/2020 CLINICAL DATA:  Shortness of breath, congestion EXAM: CHEST - 2 VIEW COMPARISON:  06/20/2018 FINDINGS: Cardiomegaly, vascular congestion. Patchy opacity at the right lung base. Left lung clear. No effusions or acute bony abnormality. IMPRESSION: Cardiomegaly, vascular congestion. Right basilar atelectasis or developing infiltrate. Electronically Signed   By: Rolm Baptise M.D.   On: 06/15/2020 22:39   US Abdomen Limited RUQ (LIVER/GB)  Result Date: 06/16/2020 CLINICAL DATA:  Alcohol abuse. EXAM: ULTRASOUND ABDOMEN LIMITED RIGHT UPPER QUADRANT COMPARISON:  None. FINDINGS: Gallbladder: No gallstones are noted, with severe gallbladder wall thickening is noted measured at 10 mm. No sonographic Murphy sign noted by sonographer. Common bile duct: Diameter: 4 mm which is within normal limits. Liver: No focal lesion identified. Heterogeneous echotexture of hepatic parenchyma is noted with nodular contours suggesting hepatic cirrhosis. Portal vein is patent on color Doppler imaging with normal direction of blood flow towards the liver. Other: Mild ascites is noted. IMPRESSION: Findings consistent with hepatic cirrhosis. Mild ascites is noted around the liver. No definite focal sonographic hepatic abnormality is noted. However, severe gallbladder wall thickening is noted without cholelithiasis. This may be due to surrounding ascites, but cholecystitis cannot be excluded. Electronically Signed   By: Marijo Conception M.D.   On: 06/16/2020 13:36     Assessment and Plan:   Acute on chronic diastolic heart failure - hx of systolic dysfunction which improved to low-normal  EF, suspected diastolic dysfunction - BNP has been steadily climbing - now 609 - echo pending   Liver dysfunction - INR 1.9, total bili pending - abdominal ultrasound completed, read pending - appreciate GI input - consider 100 mg spironolactone with 40 mg lasix   AKI - sCr continues to rise - now 1.76 (1.65) - K 3.3 - continue daily BMP   Anemia - Hb drifting down 8.8 (9.2) - consider GI consult - will hold off on TEE until cleared by GI   Persistent AFib Chronic anticoagulation - has been on eliquis - this is on hold given her anemia and INR - continue cardizem  - home lopressor dose is 150 mg BID    Severe Mitral Regurgitation - Echo 01/03/20 - repeat echo pending, but will likely need TEE to evaluate once more euvolemic   Complex situation. Question contribution of heart failure vs liver dysfunction to her volume overload.  Consider GI consult to evaluate cirrhosis and anemia.       New York Heart Association (NYHA) Functional Class NYHA Class III   CHA2DS2-VASc Score = 4  This indicates a 4.8% annual risk of stroke. The patient's score is based upon: CHF History: Yes HTN History: Yes Diabetes History: No Stroke History: No Vascular Disease History: Yes Age Score: 0 Gender Score: 1    For questions or updates, please contact [place] Please consult [email or web address] for contact info under    Signed, Ledora Bottcher, PA  06/16/2020 1:47 PM As above, patient seen and examined.  Briefly she is a 65 year old female with  past medical history of chronic diastolic congestive heart failure, coronary artery disease with prior PCI, hypertension, persistent atrial fibrillation, history of heavy alcohol abuse admitted with acute on chronic diastolic congestive heart failure and possible cirrhosis.  Last echocardiogram July 2021 showed ejection fraction 50 to 55%, mild left ventricular enlargement, moderate pulmonary hypertension, biatrial enlargement,  restricted posterior mitral valve leaflet with moderate to severe mitral regurgitation, moderate tricuspid regurgitation.  Patient states that over the past 6 months she has had progressive worsening lower extremity edema, increasing abdominal girth, weight gain of 25 pounds and increasing dyspnea on exertion.  Also with orthopnea.  She denies chest pain, fevers, hemoptysis, melena, hematochezia or hematemesis.  She has been treated with diuretics without improvement and she has been been admitted and cardiology asked to evaluate. On physical exam patient is markedly volume overloaded.  She has presacral edema, ascites, abdominal wall edema and 2-3+ bilateral lower extremity edema.  She has a 2/6 to 3/6 systolic murmur at the apex.   Laboratories show potassium 3.3, creatinine 1.76, BNP 609, hemoglobin 8.8 with MCV 89.2, INR 1.9.  Electrocardiogram shows atrial fibrillation with PVCs or aberrantly conducted beats  1 acute on chronic diastolic congestive heart failure-patient is markedly volume overloaded.  However predominant issue appears to be cirrhosis.  Blood pressure is borderline.  We will begin diuresis with Lasix 40 mg twice daily and follow renal function closely.  We will repeat echocardiogram to reassess LV function and mitral regurgitation.  2 probable cirrhosis-Patient with history of alcohol abuse.  Abdominal ultrasound shows cirrhosis.  INR also increasing.  However albumin 3.8.  We will ask gastroenterology to evaluate.  May need paracentesis.  May also need to consider addition of spironolactone.  3 permanent atrial fibrillation-heart rate mildly decreased and blood pressure borderline.  Decrease metoprolol to 100 mg twice daily and Cardizem to 60 mg every 6 hours.  Follow on telemetry and adjust regimen as needed.  Hold apixaban and treat with IV heparin as patient may require procedures.  4 mitral regurgitation-moderate to severe on most recent transthoracic echocardiogram.  May need  transesophageal echocardiogram to further assess pending above evaluation and response to therapy.  May also require right and left cardiac catheterization though the predominant issue at this point appears to be cirrhosis.  5 chronic stage III kidney disease-follow renal function closely with diuresis.  6 normocytic anemia-as above gastroenterology to evaluate  Further anemia evaluation per primary care.  Kirk Ruths

## 2020-06-16 NOTE — Hospital Course (Addendum)
Admitted 06/15/2020  Allergies: Ace inhibitors and Prednisone Pertinent Hx: HFpEF, alcohol use, Afib on Eliquis  65 y.o. female p/w volume overload  * Volume overload: ascitics, 3+LEE, BNP 609. CHF exacerbation +/- undiagnosed cirrhosis w Hx of heavy alcohol use. Received 60 mg IV lasix in ED. Will need more diuresis. Pending cardiology rec.  *Ascitics, elevated INR and T Bil: Getting RUQ [place].  *AKI: Likely in setting of volume overload. Diuresing and monitor BMP and U/output.  *Alcohol use disorder: On CIWA w/o Ativan (last drink was 10 days ago)  *Normocytic anemia: Hb 8.8. (Was 14 a year ago). Might be chronic but ordering some labs for work up. No evidence of acute bleeding.   *Afib: Continue Eliquis. Will hold if sign of active bleeding.  Consults: Cardiology Meds: lasix VTE ppx: IVF: none Diet: HHCM  []  f/u cardiology rec for   O/N events:     HPI:

## 2020-06-16 NOTE — ED Provider Notes (Signed)
[facility] EMERGENCY DEPARTMENT Provider Note   CSN: [id number] Arrival date & time: 06/15/20  2003     History Chief Complaint  Patient presents with  . Shortness of Breath    Rhonda [name] is a 65 y.o. female.  65 year old female with history of afib (on Eliquis), CHF, sent by cardiology (Dr. Margaretann Loveless) for worsening shortness of breath, edema (reports 25lb weight gain over 3-4 weeks), concern for ascites (history of heavy drinking, had cut back leading into the holidays, increased alcohol use through the holidays, last had alcohol on 06/06/20). Plan was for TEE and cardiac cath however with worsening labs, patient was send to the ED for admission to hospitalist service with request for GI and cards consults. Patient reports abdominal distention without abdominal pain, decreased oral intake due to feeling full after eating small amounts, increased number of pillows for orthopnea however abdomen makes it difficult to get comfortable, also complains of generalized body itching without rash. Denies dark or bloody stools, no history of GI bleed, had a normal cologuard in 04/2020. Denies fevers, chills, congestion or other sick symptoms. Is wearing compression socks without improvement in her lower extremity edema.         Past Medical History:  Diagnosis Date  . Atrial fibrillation ([place])   . CAD (coronary artery disease) 08/2009   BMS to RCA, non-obstructive in CX and LAD 2011  . CHF (congestive heart failure) ([place])   . Edema, peripheral   . Hypertension   . Nausea   . Pneumonia    January 2020  . Sleep apnea   . Trichomoniasis 06/05/2018    Patient Active Problem List   Diagnosis Date Noted  . Acute exacerbation of CHF (congestive heart failure) ([place]) 06/16/2020  . Pneumonia 06/20/2018  . Postmenopausal bleeding 05/29/2018  . Acute on chronic diastolic CHF (congestive heart failure) ([place])   . Acute on chronic congestive heart failure ([place]) 02/03/2016   . CHF (congestive heart failure), NYHA class I ([place]) 02/03/2016  . Essential hypertension 02/03/2016  . Atrial fibrillation ([place]) 02/03/2016  . Atrial flutter [facility])     Past Surgical History:  Procedure Laterality Date  . CESAREAN SECTION     x 1. no BTL  . CORONARY ANGIOPLASTY WITH STENT PLACEMENT    . DILATION AND CURETTAGE, DIAGNOSTIC / THERAPEUTIC     for SAB  . HYSTEROSCOPY WITH D & C N/A 03/13/2019   Procedure: DILATATION AND CURETTAGE /HYSTEROSCOPY;  Surgeon: Aletha Halim, MD;  Location: [place];  Service: Gynecology;  Laterality: N/A;     OB History    Gravida  4   Para  3   Term  3   Preterm      AB  1   Living  3     SAB  1   IAB      Ectopic      Multiple      Live Births  3        Obstetric Comments  c-section x 1. D&C x 1        Family History  Problem Relation Age of Onset  . CAD Mother   . Diabetes Mellitus II Father   . Hypertension Sister   . CAD Maternal Grandmother   . Breast cancer Neg Hx     Social History   Tobacco Use  . Smoking status: Former Smoker    Packs/day: 0.20    Types: Cigarettes  . Smokeless  tobacco: Never Used  Vaping Use  . Vaping Use: Never used  Substance Use Topics  . Alcohol use: Yes    Alcohol/week: 5.0 standard drinks    Types: 2 Cans of beer, 3 Standard drinks or equivalent per week    Comment: "drink at least 3 shots a day"  . Drug use: Yes    Types: Marijuana    Comment: nightly    Home Medications Prior to Admission medications   Medication Sig Start Date End Date Taking? Authorizing Provider  albuterol (PROVENTIL HFA;VENTOLIN HFA) 108 (90 Base) MCG/ACT inhaler Inhale 2 puffs into the lungs every 6 (six) hours as needed for wheezing or shortness of breath. 06/24/18  Yes Epifanio Lesches, MD  apixaban (ELIQUIS) 5 MG TABS tablet Take 1 tablet (5 mg total) by mouth 2 (two) times daily. Restart early afternoon on 03/14/2019 Patient taking differently: Take 5 mg by mouth  2 (two) times daily. 03/19/20  Yes Elouise Munroe, MD  atorvastatin (LIPITOR) 20 MG tablet Take 1 tablet (20 mg total) by mouth daily. Patient taking differently: Take 20 mg by mouth at bedtime. 03/19/20  Yes Elouise Munroe, MD  diltiazem (CARDIZEM CD) 360 MG 24 hr capsule Take 1 capsule (360 mg total) by mouth daily. 03/19/20  Yes Elouise Munroe, MD  levocetirizine (XYZAL) 5 MG tablet Take 5 mg by mouth every evening. 12/13/19  Yes [provider]  metoprolol tartrate (LOPRESSOR) 100 MG tablet Take 1.5 tablets (150 mg total) by mouth 2 (two) times daily. Patient taking differently: Take 100-150 mg by mouth See admin instructions. 150mg  in the am 100mg  at bedtime. 03/19/20 06/17/20 Yes Elouise Munroe, MD  spironolactone (ALDACTONE) 25 MG tablet Take 0.5 tablets (12.5 mg total) by mouth daily. 03/19/20  Yes Elouise Munroe, MD  torsemide (DEMADEX) 20 MG tablet Take 2 tablets (40 mg total) by mouth daily. 06/09/20 09/07/20 Yes Elouise Munroe, MD    Allergies    Ace inhibitors and Prednisone  Review of Systems   Review of Systems  Constitutional: Positive for appetite change and unexpected weight change. Negative for chills, diaphoresis, fatigue and fever.  Respiratory: Positive for cough and shortness of breath. Negative for wheezing.   Cardiovascular: Positive for leg swelling. Negative for chest pain and palpitations.  Gastrointestinal: Positive for abdominal distention. Negative for abdominal pain, blood in stool, constipation, diarrhea, nausea and vomiting.  Genitourinary: Negative for decreased urine volume, difficulty urinating and dyspareunia.  Musculoskeletal: Negative for arthralgias and myalgias.  Skin: Negative for rash and wound.  Neurological: Negative for dizziness and weakness.  Hematological: Does not bruise/bleed easily.  Psychiatric/Behavioral: Negative for confusion.  All other systems reviewed and are negative.   Physical Exam Updated Vital  Signs BP 98/63 (BP Location: Right Arm)   Pulse 74   Temp 97.9 F (36.6 C) (Oral)   Resp 17   Ht 5\' 3"  (1.6 m)   Wt 102 kg   SpO2 94%   BMI 39.83 kg/m   Physical Exam Vitals and nursing note reviewed.  Constitutional:      General: She is not in acute distress.    Appearance: She is well-developed and well-nourished. She is obese. She is not diaphoretic.  HENT:     Head: Normocephalic and atraumatic.  Cardiovascular:     Rate and Rhythm: Normal rate and regular rhythm.     Heart sounds: Normal heart sounds. No murmur heard.   Pulmonary:     Breath sounds: Examination of the  right-lower field reveals decreased breath sounds. Examination of the left-lower field reveals decreased breath sounds. Decreased breath sounds present.     Comments: Speaks in short sentences  Chest:     Chest wall: No tenderness.  Abdominal:     General: There is distension.     Palpations: Abdomen is soft.     Tenderness: There is no abdominal tenderness.  Musculoskeletal:     Right lower leg: Edema present.     Left lower leg: Edema present.  Skin:    General: Skin is warm and dry.     Findings: No erythema or rash.  Neurological:     Mental Status: She is alert and oriented to person, place, and time.  Psychiatric:        Mood and Affect: Mood and affect normal.        Behavior: Behavior normal.     ED Results / Procedures / Treatments   Labs (all labs ordered are listed, but only abnormal results are displayed) Labs Reviewed  BASIC METABOLIC PANEL - Abnormal; Notable for the following components:      Result Value   Sodium 134 (*)    Potassium 3.3 (*)    Chloride 91 (*)    Glucose, Bld 131 (*)    BUN 33 (*)    Creatinine, Ser 1.76 (*)    GFR, Estimated 32 (*)    All other components within normal limits  CBC - Abnormal; Notable for the following components:   RBC 3.32 (*)    Hemoglobin 8.8 (*)    HCT 29.6 (*)    MCHC 29.7 (*)    RDW 16.6 (*)    All other components within  normal limits  PROTIME-INR - Abnormal; Notable for the following components:   Prothrombin Time 21.1 (*)    INR 1.9 (*)    All other components within normal limits  BRAIN NATRIURETIC PEPTIDE - Abnormal; Notable for the following components:   B Natriuretic Peptide 609.0 (*)    All other components within normal limits  RESP PANEL BY RT-PCR (FLU A&B, COVID) ARPGX2  COMPREHENSIVE METABOLIC PANEL  HEMOGLOBIN AND HEMATOCRIT, BLOOD  HIV ANTIBODY (ROUTINE TESTING W REFLEX)  MAGNESIUM  RETICULOCYTES  FOLATE  VITAMIN B12    EKG EKG Interpretation  Date/Time:  Monday June 15 2020 22:04:46 EST Ventricular Rate:  57 PR Interval:    QRS Duration: 96 QT Interval:  500 QTC Calculation: 486 R Axis:   92 Text Interpretation: Atrial fibrillation with slow ventricular response with premature ventricular or aberrantly conducted complexes Rightward axis Nonspecific ST and T wave abnormality Abnormal ECG When compared with ECG of 06/26/2019, HEART RATE has decreased Nonspecific ST and T wave abnormality is now present Premature ventricular complexes are now present Confirmed by Delora Fuel ([id number]) on 06/16/2020 1:58:45 AM   Radiology DG Chest 2 View  Result Date: 06/15/2020 CLINICAL DATA:  Shortness of breath, congestion EXAM: CHEST - 2 VIEW COMPARISON:  06/20/2018 FINDINGS: Cardiomegaly, vascular congestion. Patchy opacity at the right lung base. Left lung clear. No effusions or acute bony abnormality. IMPRESSION: Cardiomegaly, vascular congestion. Right basilar atelectasis or developing infiltrate. Electronically Signed   By: Rolm Baptise M.D.   On: 06/15/2020 22:39    Procedures Procedures (including critical care time)  Medications Ordered in ED Medications  furosemide (LASIX) injection 60 mg (has no administration in time range)  potassium chloride SA (KLOR-CON) CR tablet 40 mEq (has no administration in time range)    ED  Course  I have reviewed the triage vital signs and the  nursing notes.  Pertinent labs & imaging results that were available during my care of the patient were reviewed by me and considered in my medical decision making (see chart for details).  Clinical Course as of 06/16/20 1226  Tue Jan 11, 444  3057 65 year old female sent by cardiology for medical admission, concern for CHF exacerbation vs or complicated by cirrhosis/ascites.  On exam, patient is obese, pleasant, speaks in short sentences but able to carry a conversation. Lungs diminished in the bases, abdomen distended, lower extremity edema. [LM]  1043 Labs obtained in triage include BMP (K 3.3), Cr 1.76 (progressive increase over the past month, 0.96 one month ago). CMP ordered for recheck as well as obtain hepatic function in regards to concern for ascites/cirrhosis.  CBC with hgb 8.8 (9.2 1 week ago, however 14.2 one year ago), denies dark/bloody stools. Awaiting repeat H&H.  INR 1.9, review of cards note, possibly related to Eliquis vs cirrhosis.  BNP 609, given 60mg  Lasix while in the ER.  [LM]  1052 Discussed with Dr. Roslynn Amble, ER attending, no further ER work up needed at this time, patient can be admitted and managed by hospitalist.  Discussed with cardiology RN, patient placed on list for cards to see. Discussed with internal medicine who will consult for admission. [LM]    Clinical Course User Index [LM] Roque Lias   MDM Rules/Calculators/A&P                          Final Clinical Impression(s) / ED Diagnoses Final diagnoses:  Acute on chronic congestive heart failure, unspecified heart failure type ([place])  Alcohol use  Volume overload    Rx / DC Orders ED Discharge Orders    None       Tacy Learn, PA-C 06/16/20 1226    Lucrezia Starch, MD 06/16/20 1243

## 2020-06-16 NOTE — Progress Notes (Signed)
Pt arrives to the unit alert and orientated times four. Heparin bolus and dose is given. BP meds not given due to pt being hypotensive. TELE bos is confirmed. Pt's needs are met and safety measures are maintained.

## 2020-06-16 NOTE — Progress Notes (Addendum)
ANTICOAGULATION CONSULT NOTE - Initial Consult  Pharmacy Consult for IV heparin Indication: atrial fibrillation  Allergies  Allergen Reactions  . Ace Inhibitors Cough  . Prednisone Itching and Swelling    Itching on leg and arms swelling from knee to ankles and felt restless    Patient Measurements: Height: 5\' 3"  (160 cm) Weight: 102 kg (224 lb 13.9 oz) IBW/kg (Calculated) : 52.4 Heparin Dosing Weight: 76.5 kg  Vital Signs: Temp: 97.9 F (36.6 C) (01/11 0911) Temp Source: Oral (01/11 0911) BP: 91/55 (01/11 1238) Pulse Rate: 90 (01/11 1238)  Labs: Recent Labs    06/15/20 2215 06/16/20 1240  HGB 8.8* 9.0*  HCT 29.6* 29.2*  PLT 255  --   LABPROT 21.1*  --   INR 1.9*  --   CREATININE 1.76*  --     Estimated Creatinine Clearance: 37.8 mL/min (A) (by C-G formula based on SCr of 1.76 mg/dL (H)).   Medical History: Past Medical History:  Diagnosis Date  . Atrial fibrillation ([place])   . CAD (coronary artery disease) 08/2009   BMS to RCA, non-obstructive in CX and LAD 2011  . CHF (congestive heart failure) ([place])   . Edema, peripheral   . Hypertension   . Nausea   . Pneumonia    January 2020  . Sleep apnea   . Trichomoniasis 06/05/2018    Medications:  (Not in a hospital admission)   Assessment: 65 year old female with PMH of heart failure, CAD with prior MI, Afib on Eliquis, hx of heavy alcohol use, and lower extremity edema, admitted for heart failure exacerbation. Pharmacy consulted to start IV heparin for atrial fibrillation.   Hgb low at 9.0 but improved/stable from 8.8 yesterday, platelets wnl. Per chart review, considering GI consult for concerns of cirrhosis and anemia. No s/sx bleeding reported.  Prior to admission Eliquis 5 mg BID for afib, last dose reported yesterday 1/10 at 0900.   Anticipate heparin level will be falsely elevated, will adjust doses based on aPTT until heparin level correlates with aPTT.  Goal of Therapy:  Heparin level 0.3-0.7  units/ml aPTT 66-102 seconds Monitor platelets by anticoagulation protocol: Yes   Plan:  Give 4000 units bolus x 1 Start heparin infusion at 1100 units/hr  APTT in 6 hours Daily aPTT, heparin level, CBC Monitor s/sx bleeding  Fara Olden, PharmD PGY-1 Pharmacy Resident 06/16/2020 2:18 PM Please see AMION for all pharmacy numbers

## 2020-06-16 NOTE — ED Notes (Signed)
Lunch Tray Ordered @ [id number].

## 2020-06-16 NOTE — H&P (Signed)
Date: 06/16/2020               Patient Name:  Rhonda [name] MRN: [id number]  DOB: 04-26-1956 Age / Sex: 65 y.o., female   PCP: Mardi Mainland, FNP         Medical Service: Internal Medicine Teaching Service         Attending Physician: Dr. Lucious Groves, DO    First Contact: Dr. Shon Baton Pager: [phone]  Second Contact: Dr. Gilford Rile Pager: [phone]       After Hours (After 5p/  First Contact Pager: [phone]  weekends / holidays): Second Contact Pager: [phone]   Chief Complaint: Shortness of breath, lower extremity and abdominal swelling  History of Present Illness: 65 year old female with HFpEF, history of heavy alcohol use, Afib on Eliquis Who was sent to emergency department by her cardiologist-(Dr. Margaretann Loveless) for increased work of breathing, volume overload and lower extremity swelling and as she did not have enough diuresis on p.o lasix.  She was seen in her cardiologist office and the plan was performing a TEE and cardiac cath, however she found her to have abnormal labs, volume overload and exam and recommended to come to ED for admission and further management and work-up. Patient reports 25 pound weight gain over the last month.  Also noticed worsening of lower extremity and abdominal swelling.  She used compression stocking for her leg swelling but it did not help that much.  She is on torsemide 40 mg a day and reports compliance to all of her medication as well as low-salt diet. She uses 2-3 pillows at night. Her leg and abdominal swelling and shortness of breath has been going on for past few months but got worse over past few weeks. Even walking at home sometimes make her SOB and she needs to lean to the counter to get some rest before walk more. She is able to do grocery shopping but stops multiple times to rest. She also reports itching No abdominal pain and generalized body itching but denies any rash. She denies any chest pain, palpitation. No bleeding on  anticoagulation.  She denies any melena, rectal bleeding or other type of bleeding. She mentions that she was a heavy alcohol user but it is a while that she has cut down.  She states that she has a valve disease and was supposed to get a heart ultrasound and cath by her cardiologist on Wednesday but pre procedure labs were abnormal and she was told to come to the hospital for admission.  ED course: No respiratory distress. She was very volume overloaded on exam. Labs on arrival: K 3.3, BNP 690 , INR 1.4, total bili 2.3 hemoglobin 8.8 (was 14 about a year ago).  Creatinine 1.76 (was 0.9 a month ago), chest x-ray with vascular congestion and cardiomegaly, right lower patchy opacity.  Received 60 mg of IV Lasix in ED, cardiology was notified and IMTS consulted for admission.  Meds: Home medications: albuterol, Eliquis, Lipitor action y, diltiazem, metoprolol titrate scratch it,, spironolactone, torsemide  Current Meds  Medication Sig  . albuterol (PROVENTIL HFA;VENTOLIN HFA) 108 (90 Base) MCG/ACT inhaler Inhale 2 puffs into the lungs every 6 (six) hours as needed for wheezing or shortness of breath.  Marland Kitchen apixaban (ELIQUIS) 5 MG TABS tablet Take 1 tablet (5 mg total) by mouth 2 (two) times daily. Restart early afternoon on 03/14/2019 (Patient taking differently: Take 5 mg by mouth 2 (two) times daily.)  . atorvastatin (LIPITOR) 20  MG tablet Take 1 tablet (20 mg total) by mouth daily. (Patient taking differently: Take 20 mg by mouth at bedtime.)  . diltiazem (CARDIZEM CD) 360 MG 24 hr capsule Take 1 capsule (360 mg total) by mouth daily.  Marland Kitchen levocetirizine (XYZAL) 5 MG tablet Take 5 mg by mouth every evening.  . metoprolol tartrate (LOPRESSOR) 100 MG tablet Take 1.5 tablets (150 mg total) by mouth 2 (two) times daily. (Patient taking differently: Take 100-150 mg by mouth See admin instructions. 150mg  in the am 100mg  at bedtime.)  . spironolactone (ALDACTONE) 25 MG tablet Take 0.5 tablets (12.5 mg total) by  mouth daily.  Marland Kitchen torsemide (DEMADEX) 20 MG tablet Take 2 tablets (40 mg total) by mouth daily.     Allergies: Allergies as of 06/15/2020 - Review Complete 06/15/2020  Allergen Reaction Noted  . Ace inhibitors Cough 02/04/2016  . Prednisone Itching and Swelling 07/03/2018   Past Medical History:  Diagnosis Date  . Atrial fibrillation ([place])   . CAD (coronary artery disease) 08/2009   BMS to RCA, non-obstructive in CX and LAD 2011  . CHF (congestive heart failure) ([place])   . Edema, peripheral   . Hypertension   . Nausea   . Pneumonia    January 2020  . Sleep apnea   . Trichomoniasis 06/05/2018    Family History:  Family History  Problem Relation Age of Onset  . CAD Mother   . Diabetes Mellitus II Father   . Hypertension Sister   . CAD Maternal Grandmother   . Breast cancer Neg Hx     Social History: Patient lives with his husband.   History of heavy alcohol use.  Last alcohol was on 06/06/2020.  Review of Systems: A complete ROS was negative except as per HPI.   Physical Exam: Blood pressure (!) 91/55, pulse 90, temperature 97.9 F (36.6 C), temperature source Oral, resp. rate 18, height 5\' 3"  (1.6 m), weight 102 kg, SpO2 92 %. Physical Exam HENT:     Head: Normocephalic and atraumatic.  Neck:     Vascular: JVD present.  Cardiovascular:     Rate and Rhythm: Normal rate. Rhythm irregular.     Heart sounds: Murmur heard.    Pulmonary:     Effort: Pulmonary effort is normal.     Breath sounds: Normal breath sounds.  Abdominal:     Palpations: Abdomen is soft.     Tenderness: There is no abdominal tenderness. There is no rebound.     Comments: Abdomen is distended.  Fluid wave  Musculoskeletal:     Right lower leg: Edema present.     Left lower leg: Edema present.  Neurological:     Mental Status: She is oriented to person, place, and time.    EKG: personally reviewed my interpretation is A. fib with heart rate of 57-60s  CXR: personally reviewed my  interpretation is vascular congestion and cardiomegaly, patchy opacity at right lower lobe.  Assessment & Plan by Problem: Active Problems:   Acute exacerbation of CHF (congestive heart failure) ([facility])   Volume overload  Volume overload: Acute on chronic HFpEF and probable cirrhosis:  -On Torsemide at home p.o. at home.  But reports decreased urination recently and admitted for IV diuresis and to monitor urinary output. -BNP on arrival 609 -Creatinine 1.76 on arrival (was 0.24-month ago) -Status post 60 mg IV Lasix this morning in ED.  -Monitor urine output -Cardiology consulted.  Appreciate recommendation>PO lasix 40 mg BID per cardiology -Strict I and  O's -Weight daily -Fluid restriction -Supplemental oxygen as needed to keep O2 saturation more than 96% -On Metoprolol 150 mg BID at home>100 mg BID here per cards given soft BP -On Diltiazem at home>Continued by cards -K was 3.3 on arrival.  Replaced in ED -Checking magnesium -BMP magnesium daily and replace electrolytes as needed -May consider paracentesis if needed after interferences and having ultrasound results  Abnormal INR, total Bil  Ascitics Probable cirrhosis AHx of heavy alcohol use: She reports many years history of heavy alcohol use.  She now drinks one alcoholic day sometimes.  She sometimes drinks more (about three beverages) per day on holidays.  She mentioned that her last drink was a new year.  She did not have any withdrawal when off of alcohol recently.  INR 1.9  Total bilirubin 2.7 AST ALT and alkaline phosphatase are normal.  -Right upper quadrant ultrasound ordered -GI consult (by cards).  Appreciate recommendations -Last drink was 06/06/2020 -CIWA without Ativan -CMP and bilirubin daily -Hepatitis panel -If cirrhosis, may need to increase spironolactone dose if Bp allows  Acute normocytic anemia Hemoglobin 8.8, MCV 89 on arrival. Hemoglobin was 14.2 a year ago. Platelets normal at 255.  Leukocyte  normal at 7.8  -No evidnece of bleeding. Denies GI bleedinng.  Asymptomati. Starting workk up with labs. Will likely need colonoscopy. GI consulted per cards.  -Reticulocyte count, LDH  -Ferritin, iron panel  -Folate and B 12 level -PBS -Per cardiology note, they will talk to GI -On IV heparin for Afib. No active bleeding.  A. fib on Eliquis:  -No RVR on current EKG -On Eliquis at home. [place] that and start on Heparine per cardiology as she may need procedure   Moderate to severe mitral regurgitation: Plan was out patient TEE and heart cath on Wednesday (Held due to volume overload and plan for admission). Now getting TTE. Cardiology is on board for next plan.  Diet: HHCM IV fluid: No VTE ppx: IV Hep Code status: Full   Dispo: Admit patient to Inpatient with expected length of stay greater than 2 midnights.  SignedDewayne Hatch, MD 06/16/2020, 3:32 PM  Pager: [phone] After 5pm on weekdays and 1pm on weekends: On Call pager: [phone]

## 2020-06-17 ENCOUNTER — Inpatient Hospital Stay (HOSPITAL_COMMUNITY): Payer: Medicare HMO

## 2020-06-17 ENCOUNTER — Encounter (HOSPITAL_COMMUNITY): Admission: RE | Payer: Self-pay | Source: Home / Self Care

## 2020-06-17 ENCOUNTER — Ambulatory Visit (HOSPITAL_COMMUNITY): Admission: RE | Admit: 2020-06-17 | Payer: Medicare HMO | Source: Home / Self Care | Admitting: Internal Medicine

## 2020-06-17 ENCOUNTER — Ambulatory Visit (HOSPITAL_COMMUNITY): Admission: RE | Admit: 2020-06-17 | Payer: Medicare HMO | Source: Home / Self Care | Admitting: Cardiovascular Disease

## 2020-06-17 DIAGNOSIS — K7031 Alcoholic cirrhosis of liver with ascites: Secondary | ICD-10-CM | POA: Diagnosis not present

## 2020-06-17 DIAGNOSIS — I4819 Other persistent atrial fibrillation: Secondary | ICD-10-CM | POA: Diagnosis not present

## 2020-06-17 DIAGNOSIS — I5033 Acute on chronic diastolic (congestive) heart failure: Secondary | ICD-10-CM

## 2020-06-17 LAB — CBC
HCT: 26.7 % — ABNORMAL LOW (ref 36.0–46.0)
Hemoglobin: 8 g/dL — ABNORMAL LOW (ref 12.0–15.0)
MCH: 26.6 pg (ref 26.0–34.0)
MCHC: 30 g/dL (ref 30.0–36.0)
MCV: 88.7 fL (ref 80.0–100.0)
Platelets: 178 10*3/uL (ref 150–400)
RBC: 3.01 MIL/uL — ABNORMAL LOW (ref 3.87–5.11)
RDW: 16.8 % — ABNORMAL HIGH (ref 11.5–15.5)
WBC: 5.5 10*3/uL (ref 4.0–10.5)
nRBC: 0 % (ref 0.0–0.2)

## 2020-06-17 LAB — PROTIME-INR
INR: 1.7 — ABNORMAL HIGH (ref 0.8–1.2)
Prothrombin Time: 19.7 seconds — ABNORMAL HIGH (ref 11.4–15.2)

## 2020-06-17 LAB — APTT: aPTT: 70 seconds — ABNORMAL HIGH (ref 24–36)

## 2020-06-17 LAB — ECHOCARDIOGRAM LIMITED
Height: 63 in
MV M vel: 4.75 m/s
MV Peak grad: 90.3 mmHg
S' Lateral: 4.1 cm
Weight: 3679.04 oz

## 2020-06-17 LAB — COMPREHENSIVE METABOLIC PANEL
ALT: 17 U/L (ref 0–44)
AST: 19 U/L (ref 15–41)
Albumin: 2.9 g/dL — ABNORMAL LOW (ref 3.5–5.0)
Alkaline Phosphatase: 75 U/L (ref 38–126)
Anion gap: 11 (ref 5–15)
BUN: 23 mg/dL (ref 8–23)
CO2: 32 mmol/L (ref 22–32)
Calcium: 8.9 mg/dL (ref 8.9–10.3)
Chloride: 93 mmol/L — ABNORMAL LOW (ref 98–111)
Creatinine, Ser: 1.38 mg/dL — ABNORMAL HIGH (ref 0.44–1.00)
GFR, Estimated: 43 mL/min — ABNORMAL LOW (ref >60)
Glucose, Bld: 93 mg/dL (ref 70–99)
Potassium: 3.4 mmol/L — ABNORMAL LOW (ref 3.5–5.1)
Sodium: 136 mmol/L (ref 135–145)
Total Bilirubin: 1.9 mg/dL — ABNORMAL HIGH (ref 0.3–1.2)
Total Protein: 5.7 g/dL — ABNORMAL LOW (ref 6.5–8.1)

## 2020-06-17 LAB — HEPARIN LEVEL (UNFRACTIONATED): Heparin Unfractionated: 2.18 IU/mL — ABNORMAL HIGH (ref 0.30–0.70)

## 2020-06-17 SURGERY — RIGHT/LEFT HEART CATH AND CORONARY ANGIOGRAPHY
Anesthesia: LOCAL

## 2020-06-17 SURGERY — ECHOCARDIOGRAM, TRANSESOPHAGEAL
Anesthesia: Monitor Anesthesia Care

## 2020-06-17 MED ORDER — SPIRONOLACTONE 25 MG PO TABS
200.0000 mg | ORAL_TABLET | Freq: Every day | ORAL | Status: DC
  Administered 2020-06-18 – 2020-06-20 (×3): 200 mg via ORAL
  Filled 2020-06-17 (×3): qty 8

## 2020-06-17 MED ORDER — POTASSIUM CHLORIDE CRYS ER 20 MEQ PO TBCR
40.0000 meq | EXTENDED_RELEASE_TABLET | Freq: Once | ORAL | Status: AC
  Administered 2020-06-17: 40 meq via ORAL
  Filled 2020-06-17: qty 2

## 2020-06-17 MED ORDER — SODIUM CHLORIDE 0.9 % IV SOLN
250.0000 mg | Freq: Once | INTRAVENOUS | Status: AC
  Administered 2020-06-17: 250 mg via INTRAVENOUS
  Filled 2020-06-17: qty 20

## 2020-06-17 MED ORDER — FUROSEMIDE 10 MG/ML IJ SOLN
40.0000 mg | Freq: Two times a day (BID) | INTRAMUSCULAR | Status: DC
  Administered 2020-06-18 – 2020-06-20 (×5): 40 mg via INTRAVENOUS
  Filled 2020-06-17 (×6): qty 4

## 2020-06-17 NOTE — Progress Notes (Addendum)
[facility] Consult: 9:59 AM 06/17/2020  LOS: 1 day    Referring Provider: Dr Heber Trafford  Primary Care Physician:  Mardi Mainland, [place] Primary Gastroenterologist:  unassigned    Reason for Consultation: New diagnosis of cirrhosis of the liver in a patient with history of alcohol abuse.   HPI: Rhonda [name] is a 65 y.o. female.  PMH CAD. CHF w preserved EF. MI age 62.. Bare-metal cardiac stent 2011. Chronic Eliquis. OSA. CKD? 12/2019 Echo: LVEF 50 to 55%, severe left atrial, moderate right atrial dilation. Moderate to severe mitral stenosis, moderate tricuspid regurg. Eccentric MR 06/17/2020 echocardiogram completed, not yet read Stage III CKD.  Endometrial polyp. Previous C-section, D&C, hysteroscopy. No prior EGD, colonoscopy.    Current diuretics consist of Aldactone 12.5 mg daily and 40 mg Demadex daily.   Patient says she has not drank alcohol since June 06, 2020, 11 days ago. For the past year consumed up to a pint of vodka daily but not every day. Higher to a year ago she drank more frequently.  In mid November she developed abdominal swelling, early satiety and her p.o. intake diminished. No nausea, vomiting. Normally would have daily morning bowel movements but in recent weeks she is more constipated and having to strain. Stools are brown to light brown. Does not see blood or black/tarry stools. Metamucil worked once but afterwards did not help much with the constipation. Chronic pedal edema progressed to lower leg edema in the last few months, has trouble fitting into her shoes now.  Regarding the lower extremity edema she had been seen at the cardiologist office and a right and left cardiac cath planned for 1/12 with Dr. Burt Knack.  Med changed from furosemide to torsemide did not improve her  edema.  Also planned urgent referral to GI for anemia, Hgb 9.2. He has appointment with Apolonio Schneiders on 07/01/2020  Currently admitted for evaluation of volume overload and abdominal swelling, DOE. Abdominal ultrasound shows cirrhosis, mild perihepatic ascites. Nonspecific, severe gallbladder wall thickening may be due to surrounding ascites, unable to R/oh cholecystitis. No cholelithiasis. CXR shows vascular congestion, cardiomegaly, right basilar ATX versus developing infiltrate. WBC is normal. Hgb 8. Was 9.2 a week ago, 14.2 one year ago. MCV 88 Platelets 178 Low iron, low iron sats, ferritin 12 (normal 11-307). B12, folate okay. INR 1.9 >> 0.7. LFTs normal now and dating back to 2017.  Sodium normal 136. Potassium low 3.3. BUN/creatinine 31/1.6 >> 23/1.3. BNP 609.  Hep A IgM, HCV Ab, Hep B surf Ag all non-reactive/negative.       Past Medical History:  Diagnosis Date  . Atrial fibrillation ([place])   . CAD (coronary artery disease) 08/2009   BMS to RCA, non-obstructive in CX and LAD 2011  . CHF (congestive heart failure) ([place])   . Edema, peripheral   . Hypertension   . Nausea   . Pneumonia    January 2020  . Sleep apnea   . Trichomoniasis 06/05/2018    Past Surgical History:  Procedure Laterality Date  . CESAREAN SECTION  x 1. no BTL  . CORONARY ANGIOPLASTY WITH STENT PLACEMENT    . DILATION AND CURETTAGE, DIAGNOSTIC / THERAPEUTIC     for SAB  . HYSTEROSCOPY WITH D & C N/A 03/13/2019   Procedure: DILATATION AND CURETTAGE /HYSTEROSCOPY;  Surgeon: Aletha Halim, MD;  Location: [facility];  Service: Gynecology;  Laterality: N/A;    Prior to Admission medications   Medication Sig Start Date End Date Taking? Authorizing Provider  albuterol (PROVENTIL HFA;VENTOLIN HFA) 108 (90 Base) MCG/ACT inhaler Inhale 2 puffs into the lungs every 6 (six) hours as needed for wheezing or shortness of breath. 06/24/18  Yes Epifanio Lesches, MD  apixaban (ELIQUIS) 5 MG  TABS tablet Take 1 tablet (5 mg total) by mouth 2 (two) times daily. Restart early afternoon on 03/14/2019 Patient taking differently: Take 5 mg by mouth 2 (two) times daily. 03/19/20  Yes Elouise Munroe, MD  atorvastatin (LIPITOR) 20 MG tablet Take 1 tablet (20 mg total) by mouth daily. Patient taking differently: Take 20 mg by mouth at bedtime. 03/19/20  Yes Elouise Munroe, MD  diltiazem (CARDIZEM CD) 360 MG 24 hr capsule Take 1 capsule (360 mg total) by mouth daily. 03/19/20  Yes Elouise Munroe, MD  levocetirizine (XYZAL) 5 MG tablet Take 5 mg by mouth every evening. 12/13/19  Yes [provider]  metoprolol tartrate (LOPRESSOR) 100 MG tablet Take 1.5 tablets (150 mg total) by mouth 2 (two) times daily. Patient taking differently: Take 100-150 mg by mouth See admin instructions. 150mg  in the am 100mg  at bedtime. 03/19/20 06/17/20 Yes Elouise Munroe, MD  spironolactone (ALDACTONE) 25 MG tablet Take 0.5 tablets (12.5 mg total) by mouth daily. 03/19/20  Yes Elouise Munroe, MD  torsemide (DEMADEX) 20 MG tablet Take 2 tablets (40 mg total) by mouth daily. 06/09/20 09/07/20 Yes Elouise Munroe, MD    Scheduled Meds: . diltiazem  60 mg Oral Q6H  . furosemide  40 mg Intravenous BID  . metoprolol tartrate  100 mg Oral BID  . potassium chloride  40 mEq Oral Once   Infusions: . heparin 1,250 Units/hr (06/17/20 0247)   PRN Meds: albuterol   Allergies as of 06/15/2020 - Review Complete 06/15/2020  Allergen Reaction Noted  . Ace inhibitors Cough 02/04/2016  . Prednisone Itching and Swelling 07/03/2018    Family History  Problem Relation Age of Onset  . CAD Mother   . Diabetes Mellitus II Father   . Hypertension Sister   . CAD Maternal Grandmother   . Breast cancer Neg Hx     Social History   Socioeconomic History  . Marital status: Divorced    Spouse name: Not on file  . Number of children: Not on file  . Years of education: Not on file  . Highest education  level: Not on file  Occupational History  . Not on file  Tobacco Use  . Smoking status: Former Smoker    Packs/day: 0.20    Types: Cigarettes  . Smokeless tobacco: Never Used  Vaping Use  . Vaping Use: Never used  Substance and Sexual Activity  . Alcohol use: Yes    Alcohol/week: 5.0 standard drinks    Types: 2 Cans of beer, 3 Standard drinks or equivalent per week    Comment: "drink at least 3 shots a day"  . Drug use: Yes    Types: Marijuana    Comment: nightly  . Sexual activity: Yes    Birth control/protection: Post-menopausal  Other  Topics Concern  . Not on file  Social History Narrative  . Not on file   Social Determinants of Health   Financial Resource Strain: Not on file  Food Insecurity: Not on file  Transportation Needs: Not on file  Physical Activity: Not on file  Stress: Not on file  Social Connections: Not on file  Intimate Partner Violence: Not on file    REVIEW OF SYSTEMS: Constitutional: Fatigue, some weakness ENT:  No nose bleeds Pulm: Dyspnea on exertion and orthopnea.  Feels more comfortable breathing with her head elevated on pillows. CV:  No palpitations, no LE edema.  Chest pressure/angina GU:  No hematuria, no frequency GI: See HPI. Heme: Denies excessive or unusual bleeding or bruising. Transfusions: Has never had transfusion with blood products. Neuro:  No headaches, no peripheral tingling or numbness.  No syncope, no seizures, no visual hallucinations. Derm:  No itching, no rash or sores.  Endocrine:  No sweats or chills.  No polyuria or dysuria Immunization: Vaccinated x2 for COVID-19. Travel:  None beyond local counties in last few months.    PHYSICAL EXAM: Vital signs in last 24 hours: Vitals:   06/17/20 0609 06/17/20 0757  BP: (!) 93/55 110/64  Pulse: 93   Resp: 18 20  Temp:  99.3 F (37.4 C)  SpO2: 93%    Wt Readings from Last 3 Encounters:  06/17/20 104.3 kg  06/09/20 102.1 kg  03/19/20 96.9 kg    General: Obese,  somewhat pale/ashen colored and mildly chronically ill-appearing.  Comfortable, pleasant, good historian Head: No facial asymmetry or swelling.  No signs of head trauma. Eyes: No conjunctival pallor.  No scleral icterus.  EOMI Ears: Not hard of hearing Nose: No congestion or discharge Mouth: Fair dentition.  Tongue midline.  Mucosa pink, moist, clear. Neck: No JVD, no masses, no thyromegaly Lungs: Diminished breath sounds at the bases.  No labored breathing or cough. Heart: Irregularly irregular.  2/6 to 3/6 systolic murmur.  S1, S2 present Abdomen: Soft without tenderness.  Active bowel sounds.  Slight distention.  No fluid wave.  Fullness in the left upper quadrant without distinct splenomegaly..   Rectal: Deferred Musc/Skeltl: No joint redness, swelling or gross deformity. Extremities: 3-4+ pedal edema, 1-2+ lower leg edema. Neurologic: Oriented x3.  Moves all 4 limbs without tremor or weakness. Skin: No telangiectasia Tattoos: On her feet, lower extremity Nodes: No cervical adenopathy Psych: Calm, pleasant, cooperative, fluid speech.  Intake/Output from previous day: 01/11 0701 - 01/12 0700 In: 120 [P.O.:120] Out: 700 [Urine:700] Intake/Output this shift: No intake/output data recorded.  LAB RESULTS: Recent Labs    06/15/20 2215 06/16/20 1240 06/17/20 0353  WBC 7.8  --  5.5  HGB 8.8* 9.0* 8.0*  HCT 29.6* 29.2* 26.7*  PLT 255  --  178   BMET Lab Results  Component Value Date   NA 136 06/17/2020   NA 136 06/16/2020   NA 134 (L) 06/15/2020   K 3.4 (L) 06/17/2020   K 3.3 (L) 06/16/2020   K 3.3 (L) 06/15/2020   CL 93 (L) 06/17/2020   CL 93 (L) 06/16/2020   CL 91 (L) 06/15/2020   CO2 32 06/17/2020   CO2 31 06/16/2020   CO2 31 06/15/2020   GLUCOSE 93 06/17/2020   GLUCOSE 106 (H) 06/16/2020   GLUCOSE 131 (H) 06/15/2020   BUN 23 06/17/2020   BUN 31 (H) 06/16/2020   BUN 33 (H) 06/15/2020   CREATININE 1.38 (H) 06/17/2020   CREATININE 1.63 (H) 06/16/2020  CREATININE 1.76 (H) 06/15/2020   CALCIUM 8.9 06/17/2020   CALCIUM 9.4 06/16/2020   CALCIUM 9.3 06/15/2020   LFT Recent Labs    06/16/20 1240 06/17/20 0353  PROT 6.5 5.7*  ALBUMIN 3.4* 2.9*  AST 22 19  ALT 18 17  ALKPHOS 79 75  BILITOT 2.3* 1.9*   PT/INR Lab Results  Component Value Date   INR 1.7 (H) 06/17/2020   INR 1.9 (H) 06/15/2020   INR 1.4 (H) 06/12/2020   Hepatitis Panel Recent Labs    06/16/20 2128  HEPBSAG NON REACTIVE  HCVAB NON REACTIVE  HEPAIGM NON REACTIVE   C-Diff No components found for: CDIFF Lipase  No results found for: LIPASE  Drugs of Abuse  No results found for: LABOPIA, COCAINSCRNUR, LABBENZ, AMPHETMU, THCU, LABBARB   RADIOLOGY STUDIES: DG Chest 2 View  Result Date: 06/15/2020 CLINICAL DATA:  Shortness of breath, congestion EXAM: CHEST - 2 VIEW COMPARISON:  06/20/2018 FINDINGS: Cardiomegaly, vascular congestion. Patchy opacity at the right lung base. Left lung clear. No effusions or acute bony abnormality. IMPRESSION: Cardiomegaly, vascular congestion. Right basilar atelectasis or developing infiltrate. Electronically Signed   By: Rolm Baptise M.D.   On: 06/15/2020 22:39   US Abdomen Limited RUQ (LIVER/GB)  Result Date: 06/16/2020 CLINICAL DATA:  Alcohol abuse. EXAM: ULTRASOUND ABDOMEN LIMITED RIGHT UPPER QUADRANT COMPARISON:  None. FINDINGS: Gallbladder: No gallstones are noted, with severe gallbladder wall thickening is noted measured at 10 mm. No sonographic Murphy sign noted by sonographer. Common bile duct: Diameter: 4 mm which is within normal limits. Liver: No focal lesion identified. Heterogeneous echotexture of hepatic parenchyma is noted with nodular contours suggesting hepatic cirrhosis. Portal vein is patent on color Doppler imaging with normal direction of blood flow towards the liver. Other: Mild ascites is noted. IMPRESSION: Findings consistent with hepatic cirrhosis. Mild ascites is noted around the liver. No definite focal  sonographic hepatic abnormality is noted. However, severe gallbladder wall thickening is noted without cholelithiasis. This may be due to surrounding ascites, but cholecystitis cannot be excluded. Electronically Signed   By: Marijo Conception M.D.   On: 06/16/2020 13:36     IMPRESSION:   *    New diagnosis of cirrhosis of the liver in an alcoholic.  Hep ABC serologies negative.  No thrombocytopenia.  Elevated INR hard to interpret in setting of chronic Eliquis  *    Iron deficiency anemia.  FOBT status not yet determined.   No previous EGD or colonoscopy.  *   Acute on chronic diastolic. CHF with preserved EF. Atrial fibrillation. Hx MI age 61, bare-metal stent, chronic Eliquis. Last dose of Eliquis 1/10.  Acute heart failure likely secondary to pre-existing diastolic dysfunction and cirrhosis. Await reading of today's echocardiogram. Currently on IV heparin  *   COVID-19 positive.  Breakthrough in patient previously vaccinated but not boosted.  CXR with possible developing right basilar infiltrate. Not on any COVID-19 medications  *    Hypokalemia.   PLAN:     *   Eventually needs colonoscopy and upper endoscopy to evaluate the anemia, and screen for portal gastropathy, varices.  *    Today's cardiac cath is postponed, not clear when this will be performed.  *    Send stool for FOBT testing.     Azucena Freed  06/17/2020, 9:59 AM Phone [phone]   ________________________________________________________________________  [facility]ler [facility] MD note:  I personally examined the patient, reviewed the data and agree with the assessment and plan described  above.  65 yo woman with newly diagnosed cirrhosis. This is likely from chronic alcoholism as she used to drink a fifth of vodka daily for many years.  Lately a bit less and completely stopped as of 10 days ago.  She does not get DTs when she stops. We are going to check for other possible causes of cirrhosis, so far she is negative for Hep  A/B/C. She's had NO overt bleeding of any kind (despite eliquis as outpatient and now IV heprain), has noticed increase abd girth and lower extremity edema as well.  She is responding clinically to IV lasix.  I am going to add aldactone at 200mg  daily starting today.  She will need eventual EGD to screen for varices (probably less likely to have varices given normal plts). She is uptodate on colon cancer screening with a negative cologuard test 3-4 months ago.  May still need colonoscopy for anemia workup however. She would be at high risk for egd/colonoscopy given her CHF, cirrhosis and covid infection.  Would hope that these could be done as outpatient in the next several weeks. Will follow along.   Owens Loffler, MD [facility] Pager [phone]

## 2020-06-17 NOTE — Progress Notes (Signed)
Progress Note  Patient Name: Rhonda [name] Date of Encounter: 06/17/2020  [facility] Cardiologist: Elouise Munroe, MD   Subjective   No CP; dyspnea somewhat improved  Inpatient Medications    Scheduled Meds: . diltiazem  60 mg Oral Q6H  . furosemide  40 mg Intravenous BID  . metoprolol tartrate  100 mg Oral BID  . potassium chloride  40 mEq Oral Once   Continuous Infusions: . heparin 1,250 Units/hr (06/17/20 0247)   PRN Meds: albuterol   Vital Signs    Vitals:   06/17/20 0259 06/17/20 0415 06/17/20 0609 06/17/20 0757  BP:  95/66 (!) 93/55 110/64  Pulse:  (!) 125 93   Resp:  20 18 20   Temp:    99.3 F (37.4 C)  TempSrc:    Oral  SpO2:  98% 93%   Weight: 104.3 kg     Height:        Intake/Output Summary (Last 24 hours) at 06/17/2020 0950 Last data filed at 06/17/2020 0700 Gross per 24 hour  Intake 120 ml  Output 700 ml  Net -580 ml   Last 3 Weights 06/17/2020 06/16/2020 06/15/2020  Weight (lbs) 229 lb 15 oz 227 lb 11.8 oz 224 lb 13.9 oz  Weight (kg) 104.3 kg 103.3 kg 102 kg      Telemetry    Atrial fibrillation; rate controlled- Personally Reviewed  Physical Exam   GEN: No acute distress.   Neck: supple Cardiac: irregular; 3/6 systolic murmur apex Respiratory: Diminished BS bases GI: Soft, + ascites MS: 2+ edema Neuro:  Nonfocal  Psych: Normal affect   Labs    Chemistry Recent Labs  Lab 06/12/20 1454 06/15/20 2215 06/16/20 1240 06/17/20 0353  NA 138 134* 136 136  K 4.0 3.3* 3.3* 3.4*  CL 92* 91* 93* 93*  CO2 30* 31 31 32  GLUCOSE 117* 131* 106* 93  BUN 29* 33* 31* 23  CREATININE 1.65* 1.76* 1.63* 1.38*  CALCIUM 9.9 9.3 9.4 8.9  PROT  --   --  6.5 5.7*  ALBUMIN  --   --  3.4* 2.9*  AST  --   --  22 19  ALT  --   --  18 17  ALKPHOS  --   --  79 75  BILITOT  --   --  2.3* 1.9*  GFRNONAA 33* 32* 35* 43*  GFRAA 38*  --   --   --   ANIONGAP  --  12 12 11      Hematology Recent Labs  Lab 06/15/20 2215 06/16/20 1240  06/17/20 0353  WBC 7.8  --  5.5  RBC 3.32* 3.42* 3.01*  HGB 8.8* 9.0* 8.0*  HCT 29.6* 29.2* 26.7*  MCV 89.2  --  88.7  MCH 26.5  --  26.6  MCHC 29.7*  --  30.0  RDW 16.6*  --  16.8*  PLT 255  --  178    BNP Recent Labs  Lab 06/12/20 1454 06/15/20 2215  BNP  --  609.0*  PROBNP 732*  --       Radiology    DG Chest 2 View  Result Date: 06/15/2020 CLINICAL DATA:  Shortness of breath, congestion EXAM: CHEST - 2 VIEW COMPARISON:  06/20/2018 FINDINGS: Cardiomegaly, vascular congestion. Patchy opacity at the right lung base. Left lung clear. No effusions or acute bony abnormality. IMPRESSION: Cardiomegaly, vascular congestion. Right basilar atelectasis or developing infiltrate. Electronically Signed   By: Rolm Baptise M.D.   On:  06/15/2020 22:39   US Abdomen Limited RUQ (LIVER/GB)  Result Date: 06/16/2020 CLINICAL DATA:  Alcohol abuse. EXAM: ULTRASOUND ABDOMEN LIMITED RIGHT UPPER QUADRANT COMPARISON:  None. FINDINGS: Gallbladder: No gallstones are noted, with severe gallbladder wall thickening is noted measured at 10 mm. No sonographic Murphy sign noted by sonographer. Common bile duct: Diameter: 4 mm which is within normal limits. Liver: No focal lesion identified. Heterogeneous echotexture of hepatic parenchyma is noted with nodular contours suggesting hepatic cirrhosis. Portal vein is patent on color Doppler imaging with normal direction of blood flow towards the liver. Other: Mild ascites is noted. IMPRESSION: Findings consistent with hepatic cirrhosis. Mild ascites is noted around the liver. No definite focal sonographic hepatic abnormality is noted. However, severe gallbladder wall thickening is noted without cholelithiasis. This may be due to surrounding ascites, but cholecystitis cannot be excluded. Electronically Signed   By: Marijo Conception M.D.   On: 06/16/2020 13:36    Patient Profile     65 year old female with past medical history of chronic diastolic congestive heart  failure, coronary artery disease with prior PCI, hypertension, persistent atrial fibrillation, history of heavy alcohol abuse admitted with acute on chronic diastolic congestive heart failure and possible cirrhosis.  Last echocardiogram July 2021 showed ejection fraction 50 to 55%, mild left ventricular enlargement, moderate pulmonary hypertension, biatrial enlargement, restricted posterior mitral valve leaflet with moderate to severe mitral regurgitation, moderate tricuspid regurgitation.    Assessment & Plan    1 acute on chronic diastolic congestive heart failure-I/O-580.  Patient remains volume overloaded.  We will continue Lasix at present dose.  Follow renal function closely.  Volume overload likely secondary to combination of diastolic congestive heart failure and cirrhosis.  Await echocardiogram for LV function and severity of mitral regurgitation.    2 probable cirrhosis-patient with long history of alcohol abuse and abdominal ultrasound shows cirrhosis.  INR was also elevated at time of admission.  Gastroenterology to evaluate.  Patient may need paracentesis and potentially the addition of spironolactone.  3 permanent atrial fibrillation-heart rate appears to be controlled.  Continue metoprolol and Cardizem at present dose.  Apixaban on hold as patient may require procedures.  Continue IV heparin.    4 mitral regurgitation-moderate to severe on most recent transthoracic echocardiogram.  May need transesophageal echocardiogram to further assess pending above evaluation and response to therapy.  May also require right and left cardiac catheterization though the predominant issue at this point appears to be cirrhosis.  5 chronic stage III kidney disease-renal function unchanged with diuresis.  We will continue to follow.  6 normocytic anemia-as above gastroenterology to evaluate  Further anemia evaluation per primary care.  7 hypokalemia-supplement  For questions or updates, please  contact [facility] Please consult [email or web address] for contact info under        Signed, Kirk Ruths, MD  06/17/2020, 9:50 AM

## 2020-06-17 NOTE — Progress Notes (Signed)
HD#1 Subjective:   No acute events overnight.  Ms. Rhonda [name] was seen at bedside this AM. She states that she is doing better than yesterday. She states that her [facility] is better and that her stomach is not as "stiff" as yesterday. She has not had a colonoscopy, she states that she did have home test, which was negative Nov 2021.   She was surprised to that her COVID test was +. She has had both vaccines as well as the booster, and was negative on 1/10. She has not been exposed to any sick contacts. She has not had sore throat, cough, fevers, or runny nose. Does not that her husband recently complained of a new headache.  Objective:   Vital signs in last 24 hours: Vitals:   06/17/20 0017 06/17/20 0259 06/17/20 0415 06/17/20 0609  BP: (!) 115/56  95/66 (!) 93/55  Pulse: 93  (!) 125 93  Resp: 20  20 18   Temp: 99.5 F (37.5 C)     TempSrc: Oral     SpO2: 97%  98% 93%  Weight:  104.3 kg    Height:       Physical Exam Constitutional: pleasant and well-appearing woman sitting on edge of bed, in no acute distress Cardiovascular: irregularly irregular rhythm, no m/r/g Pulmonary/Chest: normal work of breathing on 2 L [place], lungs with mild right basilar crackles otherwise clear Abdominal: soft, distended, no obvious fluid wave Neurological: alert & oriented x 3, no asterixis Skin: warm and dry; no spider angiomata, possible palmar erythema  Filed Weights   06/15/20 2151 06/16/20 1642 06/17/20 0259  Weight: 102 kg 103.3 kg 104.3 kg    Pertinent Labs: CBC Latest Ref Rng & Units 06/17/2020 06/16/2020 06/15/2020  WBC 4.0 - 10.5 K/uL 5.5 - 7.8  Hemoglobin 12.0 - 15.0 g/dL 8.0(L) 9.0(L) 8.8(L)  Hematocrit 36.0 - 46.0 % 26.7(L) 29.2(L) 29.6(L)  Platelets 150 - 400 K/uL 178 - 255    CMP Latest Ref Rng & Units 06/17/2020 06/16/2020 06/15/2020  Glucose 70 - 99 mg/dL 93 106(H) 131(H)  BUN 8 - 23 mg/dL 23 31(H) 33(H)  Creatinine 0.44 - 1.00 mg/dL 1.38(H) 1.63(H) 1.76(H)  Sodium 135 - 145  mmol/L 136 136 134(L)  Potassium 3.5 - 5.1 mmol/L 3.4(L) 3.3(L) 3.3(L)  Chloride 98 - 111 mmol/L 93(L) 93(L) 91(L)  CO2 22 - 32 mmol/L 32 31 31  Calcium 8.9 - 10.3 mg/dL 8.9 9.4 9.3  Total Protein 6.5 - 8.1 g/dL 5.7(L) 6.5 -  Total Bilirubin 0.3 - 1.2 mg/dL 1.9(H) 2.3(H) -  Alkaline Phos 38 - 126 U/L 75 79 -  AST 15 - 41 U/L 19 22 -  ALT 0 - 44 U/L 17 18 -    Imaging: US Abdomen Limited RUQ (LIVER/GB)  Result Date: 06/16/2020 CLINICAL DATA:  Alcohol abuse. EXAM: ULTRASOUND ABDOMEN LIMITED RIGHT UPPER QUADRANT COMPARISON:  None. FINDINGS: Gallbladder: No gallstones are noted, with severe gallbladder wall thickening is noted measured at 10 mm. No sonographic Murphy sign noted by sonographer. Common bile duct: Diameter: 4 mm which is within normal limits. Liver: No focal lesion identified. Heterogeneous echotexture of hepatic parenchyma is noted with nodular contours suggesting hepatic cirrhosis. Portal vein is patent on color Doppler imaging with normal direction of blood flow towards the liver. Other: Mild ascites is noted. IMPRESSION: Findings consistent with hepatic cirrhosis. Mild ascites is noted around the liver. No definite focal sonographic hepatic abnormality is noted. However, severe gallbladder wall thickening is noted without cholelithiasis.  This may be due to surrounding ascites, but cholecystitis cannot be excluded. Electronically Signed   By: Marijo Conception M.D.   On: 06/16/2020 13:36    Assessment/Plan:   Active Problems:   Acute exacerbation of CHF (congestive heart failure) (HCC)   Volume overload   Patient Summary: Rhonda [name] is a 65 y.o. woman with history of HFpEF, history of heavy alcohol use, permanent atrial fibrillation on Eliquis who presented with increased work of breathing and sign of volume overload and admitted due to concern for acute exacerbation of congestive heart failure and possible cirrhosis.  This is hospital day 1.  Acute on chronic heart  failure with preserved ejection fraction possibly complicated by probably alcoholic cirrhosis Patient reports improvement in dyspnea and abdominal distension with IV diuresis.  - Cardiology consulted, appreciate their recommendations - Continue diuresis - Patient continued on heparin due to possibility of cardiac intervention  Probable alcoholic cirrhosis RUQ ultrasound with changes consistent with cirrhosis and mild perihepatic ascites. On exam, no asterixis or spider angiomata. Possible palmar erythema.  - GI consulted, recommend addition of spironolactone - Viral hepatology panel negative  Iron deficiency anemia Labs consistent with IDA. Will administer IV iron due to concomitant heart failure. - IV iron infusion  COVID-19 positive in setting of vaccinated and boosted Patient has dyspnea however this is more likely secondary to heart failure exacerbation. In addition, she is vaccinated and boosted. Will not treat with remdesivir at this time.  Atrial fibrillation on Eliquis No RVR. - Holding home Eliquis; heparin per cardiology   Moderate to severe mitral regurgitation Repeat Echo stable from prior.  Diet: heart healthy, carb modified IVF: None VTE: Heparin Code: Full   Please contact the on call pager after 5 pm and on weekends at [phone].  [place] Lodge, MD PGY-1 Internal Medicine Teaching Service Pager: [phone] 06/17/2020

## 2020-06-17 NOTE — Progress Notes (Signed)
Patient's Diltiazem held due to BP 93/55, MAP 65. Dr. Alfonse Spruce notified via [facility] notification.

## 2020-06-17 NOTE — Progress Notes (Addendum)
ANTICOAGULATION CONSULT NOTE - Follow Up Consult  Pharmacy Consult for IV heparin Indication: atrial fibrillation  Allergies  Allergen Reactions  . Ace Inhibitors Cough  . Prednisone Itching and Swelling    Itching on leg and arms swelling from knee to ankles and felt restless    Patient Measurements: Height: 5\' 3"  (160 cm) Weight: 104.3 kg (229 lb 15 oz) IBW/kg (Calculated) : 52.4 Heparin Dosing Weight: 77 kg  Vital Signs: Temp: 99.3 F (37.4 C) (01/12 0757) Temp Source: Oral (01/12 0757) BP: 110/64 (01/12 0757) Pulse Rate: 93 (01/12 0609)  Labs: Recent Labs    06/15/20 2215 06/16/20 1240 06/16/20 2128 06/17/20 0353  HGB 8.8* 9.0*  --  8.0*  HCT 29.6* 29.2*  --  26.7*  PLT 255  --   --  178  APTT  --   --  55* 70*  LABPROT 21.1*  --   --  19.7*  INR 1.9*  --   --  1.7*  HEPARINUNFRC  --   --   --  2.18*  CREATININE 1.76* 1.63*  --  1.38*    Estimated Creatinine Clearance: 48.8 mL/min (A) (by C-G formula based on SCr of 1.38 mg/dL (H)).    Assessment: 65 year old W with PTA apixaban for Afib held, LD 1/10 @ 9am. Pharmacy consulted to start IV heparin for atrial fibrillation.   HL and aPTT not correlating. APTT at goal. H/H 8 slight down but may be due to blood draws, plt stable. No reported bleeding.  Addendum 1300: notified by RN that IV infiltrated, IV team replaced IV and restarted heparin. Unclear how long it was held. Will f/u aPTT/HL tomorrow   Goal of Therapy:  Heparin level 0.3-0.7 units/ml aPTT 66-102 seconds Monitor platelets by anticoagulation protocol: Yes   Plan:  Continue heparin at 1250 units/hr  F/u aPTT until correlates with heparin level  Daily aPTT, heparin level, CBC Monitor s/sx bleeding   Benetta Spar, PharmD, BCPS, BCCP Clinical Pharmacist  Please check AMION for all [place] phone numbers After 10:00 PM, call [place]

## 2020-06-17 NOTE — Progress Notes (Addendum)
Progress Note  Due to the COVID-19 pandemic, this visit was completed with telemedicine (audio/video) technology to reduce patient and provider exposure as well as to preserve personal protective equipment.   Patient Name: Rhonda [name] Date of Encounter: 06/17/2020  Primary Cardiologist: Elouise Munroe, MD   Subjective   Moderately hypotensive therefore diltiazem was held. COVID testing came back positive>>currently on appropriate precautions   Inpatient Medications    Scheduled Meds: . diltiazem  60 mg Oral Q6H  . furosemide  40 mg Intravenous BID  . metoprolol tartrate  100 mg Oral BID   Continuous Infusions: . heparin 1,250 Units/hr (06/17/20 0247)   PRN Meds: albuterol   Vital Signs    Vitals:   06/17/20 0259 06/17/20 0415 06/17/20 0609 06/17/20 0757  BP:  95/66 (!) 93/55 110/64  Pulse:  (!) 125 93   Resp:  20 18 20   Temp:    99.3 F (37.4 C)  TempSrc:    Oral  SpO2:  98% 93%   Weight: 104.3 kg     Height:        Intake/Output Summary (Last 24 hours) at 06/17/2020 0807 Last data filed at 06/16/2020 2300 Gross per 24 hour  Intake 120 ml  Output 300 ml  Net -180 ml   Last 3 Weights 06/17/2020 06/16/2020 06/15/2020  Weight (lbs) 229 lb 15 oz 227 lb 11.8 oz 224 lb 13.9 oz  Weight (kg) 104.3 kg 103.3 kg 102 kg     Telemetry    06/17/20 AF with rates in the 90-100 range - Personally Reviewed  ECG    No new tracing as of 06/17/20- Personally Reviewed  Physical Exam   VITAL SIGNS:  reviewed GEN:  no acute distress NEURO:  alert and oriented x 3, no obvious focal deficit PSYCH:  normal affect  Labs    Chemistry Recent Labs  Lab 06/12/20 1454 06/15/20 2215 06/16/20 1240 06/17/20 0353  NA 138 134* 136 136  K 4.0 3.3* 3.3* 3.4*  CL 92* 91* 93* 93*  CO2 30* 31 31 32  GLUCOSE 117* 131* 106* 93  BUN 29* 33* 31* 23  CREATININE 1.65* 1.76* 1.63* 1.38*  CALCIUM 9.9 9.3 9.4 8.9  PROT  --   --  6.5 5.7*  ALBUMIN  --   --  3.4* 2.9*  AST   --   --  22 19  ALT  --   --  18 17  ALKPHOS  --   --  79 75  BILITOT  --   --  2.3* 1.9*  GFRNONAA 33* 32* 35* 43*  GFRAA 38*  --   --   --   ANIONGAP  --  12 12 11      Hematology Recent Labs  Lab 06/15/20 2215 06/16/20 1240 06/17/20 0353  WBC 7.8  --  5.5  RBC 3.32* 3.42* 3.01*  HGB 8.8* 9.0* 8.0*  HCT 29.6* 29.2* 26.7*  MCV 89.2  --  88.7  MCH 26.5  --  26.6  MCHC 29.7*  --  30.0  RDW 16.6*  --  16.8*  PLT 255  --  178    Cardiac EnzymesNo results for input(s): TROPONINI in the last 168 hours. No results for input(s): TROPIPOC in the last 168 hours.   BNP Recent Labs  Lab 06/12/20 1454 06/15/20 2215  BNP  --  609.0*  PROBNP 732*  --      DDimer No results for input(s): DDIMER in the last  168 hours.   Radiology    DG Chest 2 View  Result Date: 06/15/2020 CLINICAL DATA:  Shortness of breath, congestion EXAM: CHEST - 2 VIEW COMPARISON:  06/20/2018 FINDINGS: Cardiomegaly, vascular congestion. Patchy opacity at the right lung base. Left lung clear. No effusions or acute bony abnormality. IMPRESSION: Cardiomegaly, vascular congestion. Right basilar atelectasis or developing infiltrate. Electronically Signed   By: Rolm Baptise M.D.   On: 06/15/2020 22:39   US Abdomen Limited RUQ (LIVER/GB)  Result Date: 06/16/2020 CLINICAL DATA:  Alcohol abuse. EXAM: ULTRASOUND ABDOMEN LIMITED RIGHT UPPER QUADRANT COMPARISON:  None. FINDINGS: Gallbladder: No gallstones are noted, with severe gallbladder wall thickening is noted measured at 10 mm. No sonographic Murphy sign noted by sonographer. Common bile duct: Diameter: 4 mm which is within normal limits. Liver: No focal lesion identified. Heterogeneous echotexture of hepatic parenchyma is noted with nodular contours suggesting hepatic cirrhosis. Portal vein is patent on color Doppler imaging with normal direction of blood flow towards the liver. Other: Mild ascites is noted. IMPRESSION: Findings consistent with hepatic cirrhosis. Mild  ascites is noted around the liver. No definite focal sonographic hepatic abnormality is noted. However, severe gallbladder wall thickening is noted without cholelithiasis. This may be due to surrounding ascites, but cholecystitis cannot be excluded. Electronically Signed   By: Marijo Conception M.D.   On: 06/16/2020 13:36   Cardiac Studies   Echo 01/03/20: 1. Inferior basal hypokinesis. Left ventricular ejection fraction, by  estimation, is 50 to 55%. The left ventricle has low normal function. The  left ventricle demonstrates regional wall motion abnormalities (see  scoring diagram/findings for  description). The left ventricular internal cavity size was mildly  dilated. Left ventricular diastolic parameters are indeterminate.  2. Right ventricular systolic function is normal. The right ventricular  size is normal. There is moderately elevated pulmonary artery systolic  pressure.  3. Left atrial size was severely dilated.  4. Right atrial size was moderately dilated.  5. MR eccentric posterior ? from atrial enlargement and type 3A  restricted posterior leaflet motion. Consider TEE for further assess  severity if clinically indicated . The mitral valve is normal in  structure. Moderate mitral valve regurgitation.  Moderate to severe mitral stenosis.  6. Tricuspid valve regurgitation is moderate.  7. The aortic valve is normal in structure. Aortic valve regurgitation is  not visualized. No aortic stenosis is present.  8. The inferior vena cava is normal in size with greater than 50%  respiratory variability, suggesting right atrial pressure of 3 mmHg.   Patient Profile     65 y.o. female with a hx of chronic systolic and diastolic heart failure, CAD with prior MI, HTN, Afib on eliquis, hx of heavy alcohol use, and lower extremity edemawho is being seen today for the evaluation of volume overloadat the request of Dr. Roslynn Amble.  Assessment & Plan    1. Acute on chronic diastolic  heart failure: -Pt presented with marked fluid volume overload however appears to be mostly secondary to cirrhosis  -IV Lasix 40mg  BID started 06/16/20>>poor UO>>will increase to 60mg  BID and follow  -Weight, 229lb with an admission weight at 224lb  -I&O, net negative 17ml  -Echo pending today  -Prior hx of systolic dysfunction which improved to low-normal EF  2. Liver dysfunction likely secondary to cirrhosis: -Has hx of alcohol abuse who presented with acute volume overload and ascites with a 25lb weight gain -Albumin worse at 2.9 from 3.4 -INR 1.7 -Abdominal US performed yesterday with findings  consistent with hepatic cirrhosis  -Management per IM  -Given relatively poor response to current Lasix dose, would consider increasing and following response from there   3. Permanent atrial fibrillation: -Eliquis on hold due to anemia and elevated INR in the setting of acute liver dysfunction  -Metoprolol decreased to 100mg  BID and diltiazem to 60mg  Q6H for rate control 06/16/20 -Telemetry review today shows AF with rates more controlled in the 90-100 range  -AM diltiazem held due to hypotension>>could consider adding amiodarone  versus digoxin for rate control if BP continues to be an issue    4. Mod to Severe MR: -Per prior echocardiogram with pending repeat echo this admission  -May require further evalution with TEE   5. CKD stage III: -Cr today improved to 1.38 from 1.63 yesterday  -Follow with daily BMET -Baseline appears to be in the 0. 8-0.9 range   6. COVID-19 infection: -Resulted at 1235 on 06/16/20 -Management per IM     For questions or updates, please contact [place] Please consult [email or web address] for contact info under  Signed, Kathyrn Drown, NP  06/17/2020, 8:07 AM     See progress notes [place]

## 2020-06-17 NOTE — Progress Notes (Signed)
  Echocardiogram 2D Echocardiogram has been performed.  Rhonda [name] 06/17/2020, 11:48 AM

## 2020-06-17 NOTE — Progress Notes (Signed)
Patient's Diltiazem held at this time due to BP 93/55, MAP 65. Dr. Cline Cools notified via [place] notification.

## 2020-06-17 NOTE — Progress Notes (Signed)
Pt does not c/o any pain. Her appetite is adequate and oral intake as well. Pt is compliant with IS. Pt did not receive any of her BP medication or diuretics due to being hypotensive during the entire shift. Pt's needs are met and safety & isolation measures are maintained. Heparin is infusing on the left upper extremity.

## 2020-06-18 DIAGNOSIS — I4819 Other persistent atrial fibrillation: Secondary | ICD-10-CM | POA: Diagnosis not present

## 2020-06-18 DIAGNOSIS — E8770 Fluid overload, unspecified: Secondary | ICD-10-CM

## 2020-06-18 DIAGNOSIS — K7031 Alcoholic cirrhosis of liver with ascites: Secondary | ICD-10-CM | POA: Diagnosis not present

## 2020-06-18 DIAGNOSIS — I5033 Acute on chronic diastolic (congestive) heart failure: Secondary | ICD-10-CM | POA: Diagnosis not present

## 2020-06-18 LAB — BASIC METABOLIC PANEL
Anion gap: 8 (ref 5–15)
BUN: 17 mg/dL (ref 8–23)
CO2: 34 mmol/L — ABNORMAL HIGH (ref 22–32)
Calcium: 9.1 mg/dL (ref 8.9–10.3)
Chloride: 97 mmol/L — ABNORMAL LOW (ref 98–111)
Creatinine, Ser: 1.12 mg/dL — ABNORMAL HIGH (ref 0.44–1.00)
GFR, Estimated: 56 mL/min — ABNORMAL LOW (ref >60)
Glucose, Bld: 95 mg/dL (ref 70–99)
Potassium: 4.5 mmol/L (ref 3.5–5.1)
Sodium: 139 mmol/L (ref 135–145)

## 2020-06-18 LAB — CBC
HCT: 26.5 % — ABNORMAL LOW (ref 36.0–46.0)
Hemoglobin: 8.1 g/dL — ABNORMAL LOW (ref 12.0–15.0)
MCH: 27.4 pg (ref 26.0–34.0)
MCHC: 30.6 g/dL (ref 30.0–36.0)
MCV: 89.5 fL (ref 80.0–100.0)
Platelets: 186 10*3/uL (ref 150–400)
RBC: 2.96 MIL/uL — ABNORMAL LOW (ref 3.87–5.11)
RDW: 17 % — ABNORMAL HIGH (ref 11.5–15.5)
WBC: 5.3 10*3/uL (ref 4.0–10.5)
nRBC: 0 % (ref 0.0–0.2)

## 2020-06-18 LAB — HEPARIN LEVEL (UNFRACTIONATED)
Heparin Unfractionated: 1.28 IU/mL — ABNORMAL HIGH (ref 0.30–0.70)
Heparin Unfractionated: 0.61 IU/mL (ref 0.30–0.70)

## 2020-06-18 LAB — OCCULT BLOOD X 1 CARD TO LAB, STOOL: Fecal Occult Bld: POSITIVE — AB

## 2020-06-18 LAB — APTT
aPTT: 155 seconds — ABNORMAL HIGH (ref 24–36)
aPTT: 98 seconds — ABNORMAL HIGH (ref 24–36)

## 2020-06-18 MED ORDER — HEPARIN (PORCINE) 25000 UT/250ML-% IV SOLN
1200.0000 [IU]/h | INTRAVENOUS | Status: DC
  Administered 2020-06-18: 1050 [IU]/h via INTRAVENOUS
  Filled 2020-06-18 (×3): qty 250

## 2020-06-18 MED ORDER — DIGOXIN 125 MCG PO TABS
0.1250 mg | ORAL_TABLET | Freq: Every day | ORAL | Status: DC
  Administered 2020-06-19 – 2020-06-20 (×2): 0.125 mg via ORAL
  Filled 2020-06-18 (×2): qty 1

## 2020-06-18 MED ORDER — DIGOXIN 0.25 MG/ML IJ SOLN
0.2500 mg | Freq: Once | INTRAMUSCULAR | Status: AC
  Administered 2020-06-18: 0.25 mg via INTRAVENOUS
  Filled 2020-06-18: qty 2

## 2020-06-18 MED ORDER — SODIUM CHLORIDE 0.9 % IV SOLN
250.0000 mg | Freq: Once | INTRAVENOUS | Status: AC
  Administered 2020-06-18: 250 mg via INTRAVENOUS
  Filled 2020-06-18: qty 20

## 2020-06-18 NOTE — Progress Notes (Signed)
Metoprolol held due to BP 86/58. MAP 68. HR 96. Dr. Alfonse Spruce notified via [facility] communication.

## 2020-06-18 NOTE — Progress Notes (Signed)
ANTICOAGULATION CONSULT NOTE - Follow Up Consult  Pharmacy Consult for IV heparin Indication: atrial fibrillation  Allergies  Allergen Reactions  . Ace Inhibitors Cough  . Prednisone Itching and Swelling    Itching on leg and arms swelling from knee to ankles and felt restless    Patient Measurements: Height: 5\' 3"  (160 cm) Weight: 104.1 kg (229 lb 8 oz) (scale C) IBW/kg (Calculated) : 52.4 Heparin Dosing Weight: 77 kg  Vital Signs: Temp: 98 F (36.7 C) (01/13 0610) Temp Source: Oral (01/13 0532) BP: 93/70 (01/13 0610) Pulse Rate: 57 (01/13 0610)  Labs: Recent Labs    06/15/20 2215 06/16/20 1240 06/16/20 2128 06/17/20 0353 06/18/20 0333  HGB 8.8* 9.0*  --  8.0* 8.1*  HCT 29.6* 29.2*  --  26.7* 26.5*  PLT 255  --   --  178 186  APTT  --   --  55* 70*  --   LABPROT 21.1*  --   --  19.7*  --   INR 1.9*  --   --  1.7*  --   HEPARINUNFRC  --   --   --  2.18* 1.28*  CREATININE 1.76* 1.63*  --  1.38* 1.12*    Estimated Creatinine Clearance: 60.1 mL/min (A) (by C-G formula based on SCr of 1.12 mg/dL (H)).    Assessment: 65 year old W with PTA apixaban for Afib held, LD 1/10 @ 9am. Pharmacy consulted to start IV heparin for atrial fibrillation.   First APTT level > 200 but HL was reasonable at 1.28 and downtrending. Repeat APTT ordered and confirmed drawn from R arm where no IVs are running. APTT is still supratherapeutic at 155. HL and aPTT may be correlating. Noted that heparin was held 1/12 when IV line infiltrated for unknown amount of time. H/H & plt stable. Noted patient had a small nose bleed 1/13 AM that is resolved, also FOBT positive, pending GI recs.    Goal of Therapy:  Heparin level 0.3-0.7 units/ml aPTT 66-102 seconds Monitor platelets by anticoagulation protocol: Yes   Plan:  Hold heparin x 30 min and restart at 1050 units/hr  F/u 6hr HL/APTT F/u aPTT until correlates with heparin level  Daily aPTT, heparin level, CBC Monitor s/sx  bleeding   Benetta Spar, PharmD, BCPS, BCCP Clinical Pharmacist  Please check AMION for all [place] phone numbers After 10:00 PM, call [place] [phone]   Phlebotomy CODE, 2H, 546M, 446M, 13M, 62M, 62MP [phone]  2W, 5W, 3W, 4W, 46M [phone] or [phone]  2C, 6C, 4N, 4NP, 5N, 6N, 5C [phone] or [phone]  4E, Karleen Dolphin, [place]

## 2020-06-18 NOTE — Plan of Care (Signed)

## 2020-06-18 NOTE — Progress Notes (Signed)
Progress Note  Patient Name: Rhonda [name] Date of Encounter: 06/18/2020  [facility] Cardiologist: Elouise Munroe, MD   Subjective   No CP; remains dyspneic  Inpatient Medications    Scheduled Meds: . diltiazem  60 mg Oral Q6H  . furosemide  40 mg Intravenous BID  . metoprolol tartrate  100 mg Oral BID  . spironolactone  200 mg Oral Daily   Continuous Infusions: . ferric gluconate (FERRLECIT/NULECIT) IV    . heparin 1,250 Units/hr (06/17/20 1827)   PRN Meds: albuterol   Vital Signs    Vitals:   06/18/20 0048 06/18/20 0300 06/18/20 0532 06/18/20 0610  BP: 96/66 (!) 100/57 (!) 93/59 93/70  Pulse: 99  80 (!) 57  Resp: 20  (!) 22 18  Temp: 97.7 F (36.5 C) 98.5 F (36.9 C) 98.3 F (36.8 C) 98 F (36.7 C)  TempSrc: Oral Oral Oral   SpO2: 96% 96% 94% 93%  Weight:  104.1 kg    Height:        Intake/Output Summary (Last 24 hours) at 06/18/2020 0905 Last data filed at 06/18/2020 0406 Gross per 24 hour  Intake 835.72 ml  Output 1201 ml  Net -365.28 ml   Last 3 Weights 06/18/2020 06/17/2020 06/16/2020  Weight (lbs) 229 lb 8 oz 229 lb 15 oz 227 lb 11.8 oz  Weight (kg) 104.101 kg 104.3 kg 103.3 kg      Telemetry    Atrial fibrillation with PVCs or aberrantly conducted beats; rate controlled- Personally Reviewed  Physical Exam   GEN: WD NAD  Neck: supple; JVP difficult to assess Cardiac: irregular; 3/6 systolic murmur apex; no gallop Respiratory: Diminished BS bases; no wheeze GI: Soft, + ascites, no masses MS: 2+ edema Neuro:  Grossly intact Psych: Normal affect   Labs    Chemistry Recent Labs  Lab 06/12/20 1454 06/15/20 2215 06/16/20 1240 06/17/20 0353 06/18/20 0333  NA 138   < > 136 136 139  K 4.0   < > 3.3* 3.4* 4.5  CL 92*   < > 93* 93* 97*  CO2 30*   < > 31 32 34*  GLUCOSE 117*   < > 106* 93 95  BUN 29*   < > 31* 23 17  CREATININE 1.65*   < > 1.63* 1.38* 1.12*  CALCIUM 9.9   < > 9.4 8.9 9.1  PROT  --   --  6.5 5.7*  --    ALBUMIN  --   --  3.4* 2.9*  --   AST  --   --  22 19  --   ALT  --   --  18 17  --   ALKPHOS  --   --  79 75  --   BILITOT  --   --  2.3* 1.9*  --   GFRNONAA 33*   < > 35* 43* 56*  GFRAA 38*  --   --   --   --   ANIONGAP  --    < > 12 11 8    < > = values in this interval not displayed.     Hematology Recent Labs  Lab 06/15/20 2215 06/16/20 1240 06/17/20 0353 06/18/20 0333  WBC 7.8  --  5.5 5.3  RBC 3.32* 3.42* 3.01* 2.96*  HGB 8.8* 9.0* 8.0* 8.1*  HCT 29.6* 29.2* 26.7* 26.5*  MCV 89.2  --  88.7 89.5  MCH 26.5  --  26.6 27.4  MCHC 29.7*  --  30.0 30.6  RDW 16.6*  --  16.8* 17.0*  PLT 255  --  178 186    BNP Recent Labs  Lab 06/12/20 1454 06/15/20 2215  BNP  --  609.0*  PROBNP 732*  --       Radiology    ECHOCARDIOGRAM LIMITED  Result Date: 06/17/2020    ECHOCARDIOGRAM LIMITED REPORT   Patient Name:   Rhonda [name] Date of Exam: 06/17/2020 Medical Rec #:  [id number]            Height:       63.0 in Accession #:    [id number]           Weight:       229.9 lb Date of Birth:  02/04/1956            BSA:          2.052 m Patient Age:    65 years             BP:           110/64 mmHg Patient Gender: F                    HR:           93 bpm. Exam Location:  Inpatient Procedure: Limited Echo, Limited Color Doppler and Cardiac Doppler Indications:    Congestive Heart Failure  History:        Patient has prior history of Echocardiogram examinations, most                 recent 01/03/2020. CAD, COVID-19 Positive; Risk                 Factors:Hypertension.  Sonographer:    Mikki Santee RDCS (AE) Referring Phys: [id number] [place]  1. Left ventricular ejection fraction, by estimation, is 50 to 55%. The left ventricle has low normal function. The left ventricle has no regional wall motion abnormalities. There is mild left ventricular hypertrophy.  2. Right ventricular systolic function is moderately reduced. The right ventricular size is moderately enlarged.  There is moderately elevated pulmonary artery systolic pressure.  3. Left atrial size was severely dilated.  4. Right atrial size was severely dilated.  5. Eccentric posteriorly directed MR likely related to restricted posterior leaflet motion and atrial enlargement . The mitral valve is abnormal. Moderate mitral valve regurgitation. No evidence of mitral stenosis.  6. Tricuspid valve regurgitation is severe.  7. The aortic valve is normal in structure. Aortic valve regurgitation is not visualized. Mild aortic valve sclerosis is present, with no evidence of aortic valve stenosis.  8. The inferior vena cava is dilated in size with <50% respiratory variability, suggesting right atrial pressure of 15 mmHg. FINDINGS  Left Ventricle: Left ventricular ejection fraction, by estimation, is 50 to 55%. The left ventricle has low normal function. The left ventricle has no regional wall motion abnormalities. The left ventricular internal cavity size was normal in size. There is mild left ventricular hypertrophy. Right Ventricle: The right ventricular size is moderately enlarged. Right vetricular wall thickness was not assessed. Right ventricular systolic function is moderately reduced. There is moderately elevated pulmonary artery systolic pressure. The tricuspid regurgitant velocity is 3.32 m/s, and with an assumed right atrial pressure of 8 mmHg, the estimated right ventricular systolic pressure is 123XX123 mmHg. Left Atrium: Left atrial size was severely dilated. Right Atrium: Right atrial size was severely dilated. Pericardium: There is no evidence of  pericardial effusion. Mitral Valve: Eccentric posteriorly directed MR likely related to restricted posterior leaflet motion and atrial enlargement. The mitral valve is abnormal. There is mild thickening of the mitral valve leaflet(s). There is mild calcification of the mitral  valve leaflet(s). Moderate mitral valve regurgitation. No evidence of mitral valve stenosis. Tricuspid  Valve: The tricuspid valve is normal in structure. Tricuspid valve regurgitation is severe. No evidence of tricuspid stenosis. Aortic Valve: The aortic valve is normal in structure. Aortic valve regurgitation is not visualized. Mild aortic valve sclerosis is present, with no evidence of aortic valve stenosis. Pulmonic Valve: The pulmonic valve was normal in structure. Pulmonic valve regurgitation is not visualized. No evidence of pulmonic stenosis. Aorta: The aortic root is normal in size and structure. Venous: The inferior vena cava is dilated in size with less than 50% respiratory variability, suggesting right atrial pressure of 15 mmHg. IAS/Shunts: No atrial level shunt detected by color flow Doppler. LEFT VENTRICLE PLAX 2D LVIDd:         5.90 cm LVIDs:         4.10 cm LV PW:         1.20 cm LV IVS:        1.20 cm LVOT diam:     2.30 cm LVOT Area:     4.15 cm  LEFT ATRIUM              Index       RIGHT ATRIUM           Index LA diam:        6.60 cm  3.22 cm/m  RA Area:     28.60 cm LA Vol (A2C):   142.0 ml 69.21 ml/m RA Volume:   91.40 ml  44.55 ml/m LA Vol (A4C):   178.0 ml 86.76 ml/m LA Biplane Vol: 162.0 ml 78.96 ml/m   AORTA Ao Root diam: 2.90 cm MR Peak grad: 90.2 mmHg   TRICUSPID VALVE MR Mean grad: 57.0 mmHg   TR Peak grad:   44.1 mmHg MR Vmax:      475.00 cm/s TR Vmax:        332.00 cm/s MR Vmean:     356.0 cm/s                           SHUNTS                           Systemic Diam: 2.30 cm Jenkins Rouge MD Electronically signed by Jenkins Rouge MD Signature Date/Time: 06/17/2020/12:06:13 PM    Final    US Abdomen Limited RUQ (LIVER/GB)  Result Date: 06/16/2020 CLINICAL DATA:  Alcohol abuse. EXAM: ULTRASOUND ABDOMEN LIMITED RIGHT UPPER QUADRANT COMPARISON:  None. FINDINGS: Gallbladder: No gallstones are noted, with severe gallbladder wall thickening is noted measured at 10 mm. No sonographic Murphy sign noted by sonographer. Common bile duct: Diameter: 4 mm which is within normal limits. Liver:  No focal lesion identified. Heterogeneous echotexture of hepatic parenchyma is noted with nodular contours suggesting hepatic cirrhosis. Portal vein is patent on color Doppler imaging with normal direction of blood flow towards the liver. Other: Mild ascites is noted. IMPRESSION: Findings consistent with hepatic cirrhosis. Mild ascites is noted around the liver. No definite focal sonographic hepatic abnormality is noted. However, severe gallbladder wall thickening is noted without cholelithiasis. This may be due to surrounding ascites, but cholecystitis cannot be excluded. Electronically Signed  By: Marijo Conception M.D.   On: 06/16/2020 13:36    Patient Profile     65 year old female with past medical history of chronic diastolic congestive heart failure, coronary artery disease with prior PCI, hypertension, persistent atrial fibrillation, history of heavy alcohol abuse admitted with acute on chronic diastolic congestive heart failure and possible cirrhosis.  Last echocardiogram July 2021 showed ejection fraction 50 to 55%, mild left ventricular enlargement, moderate pulmonary hypertension, biatrial enlargement, restricted posterior mitral valve leaflet with moderate to severe mitral regurgitation, moderate tricuspid regurgitation.    Assessment & Plan    1 acute on chronic diastolic congestive heart failure-diuresis has been limited by blood pressure.  We will continue Lasix and spironolactone as tolerated.  She remains volume overloaded.  Follow renal function with diuresis.  Echocardiogram shows preserved LV function and moderate mitral regurgitation.  Volume excess is felt secondary to a combination of diastolic congestive heart failure and cirrhosis.  2 cirrhosis-patient with long history of alcohol abuse and abdominal ultrasound shows cirrhosis.  INR was also elevated at time of admission.  Gastroenterology has evaluated and spironolactone has been added.  Further work-up at their discretion.  3  permanent atrial fibrillation-heart rate has been controlled but blood pressure borderline limiting ability to diurese.  We will continue metoprolol but discontinue Cardizem.  Add digoxin.  Follow heart rate and adjust regimen as needed.  Apixaban on hold as patient may require procedures.  Continue IV heparin.    4 mitral regurgitation-moderate on most recent transthoracic echocardiogram.  May need transesophageal echocardiogram to further assess pending above evaluation and response to therapy.  May also require right and left cardiac catheterization though the predominant issue at this point appears to be cirrhosis.  Can consider above evaluation once volume status improves.  5 chronic stage III kidney disease-creatinine improving.  6 normocytic anemia-gastroenterology plans EGD and colonoscopy in several weeks after COVID infection resolved.  7 COVID-management per primary care.  For questions or updates, please contact [place] Please consult [email or web address] for contact info under        Signed, Kirk Ruths, MD  06/18/2020, 9:05 AM

## 2020-06-18 NOTE — Progress Notes (Signed)
[facility] Progress Note    Since last GI note: Diuretics held yesterday for low SBP. She did receive some lasix this mronign. I cannot tell if she also received aldactone.  Objective: Vital signs in last 24 hours: Temp:  [97.7 F (36.5 C)-98.5 F (36.9 C)] 98 F (36.7 C) (01/13 0610) Pulse Rate:  [57-99] 57 (01/13 0610) Resp:  [18-22] 18 (01/13 0610) BP: (88-110)/(57-70) 93/70 (01/13 0610) SpO2:  [93 %-99 %] 93 % (01/13 0610) Weight:  [104.1 kg] 104.1 kg (01/13 0300) Last BM Date: 06/17/20 General: alert and oriented times 3 Heart: regular rate and rythm Abdomen: soft, non-tender, non-distended, normal bowel sounds Her weight (per epic reports) is 0.3kg lower today than it was yesterday 2+ pitting edema ankles bilaterally  Lab Results: Recent Labs    06/15/20 2215 06/16/20 1240 06/17/20 0353 06/18/20 0333  WBC 7.8  --  5.5 5.3  HGB 8.8* 9.0* 8.0* 8.1*  PLT 255  --  178 186  MCV 89.2  --  88.7 89.5   Recent Labs    06/16/20 1240 06/17/20 0353 06/18/20 0333  NA 136 136 139  K 3.3* 3.4* 4.5  CL 93* 93* 97*  CO2 31 32 34*  GLUCOSE 106* 93 95  BUN 31* 23 17  CREATININE 1.63* 1.38* 1.12*  CALCIUM 9.4 8.9 9.1   Recent Labs    06/16/20 1240 06/17/20 0353  PROT 6.5 5.7*  ALBUMIN 3.4* 2.9*  AST 22 19  ALT 18 17  ALKPHOS 79 75  BILITOT 2.3* 1.9*   Recent Labs    06/15/20 2215 06/17/20 0353  INR 1.9* 1.7*    Medications: Scheduled Meds: . [START ON 06/19/2020] digoxin  0.125 mg Oral Daily  . furosemide  40 mg Intravenous BID  . metoprolol tartrate  100 mg Oral BID  . spironolactone  200 mg Oral Daily   Continuous Infusions: . ferric gluconate (FERRLECIT/NULECIT) IV    . heparin     PRN Meds:.albuterol  Assessment/Plan: 65 y.o. female with newly diagnosed cirrhosis.  Likely due to in part to long standing alcoholism, last drink 2 weeks ago. Severe tricuspid regurgitation on recent echo and so  there is probably a component of  'cardiogenic' cirrhosis as well.  Hep A/B/C negative. We have drawn some autoimmune markers as well.  I think main goal currently is getting her edema under control.  She is currently ordered for lasix 40mg  BID IV and aldactone 200mg  PO daily.  Should use SBP of 90 as a cutoff to hold diuretics rather than 100.   She has heme + stool and IDA.  This will need further workup with colonoscopy and EGd.  I prefer that her fluid status be a bit more stable and that she recover from her Covid infection before proceeding with invasive GI testing.  I think it is safe to resume her eliquis from my perspective, she is not having any overt GI bleeding.  I will communicate with cardiology about the acuity of their likely testing and could consider sooner invasive GI testing if that is necessary.  Milus Banister, MD  06/18/2020, 1:10 PM [facility] Pager [phone]

## 2020-06-18 NOTE — Progress Notes (Signed)
ANTICOAGULATION CONSULT NOTE - Follow Up Consult  Pharmacy Consult for IV heparin Indication: atrial fibrillation  Allergies  Allergen Reactions  . Ace Inhibitors Cough  . Prednisone Itching and Swelling    Itching on leg and arms swelling from knee to ankles and felt restless    Patient Measurements: Height: 5\' 3"  (160 cm) Weight: 104.1 kg (229 lb 8 oz) (scale C) IBW/kg (Calculated) : 52.4 Heparin Dosing Weight: 77 kg  Vital Signs: BP: 108/78 (01/13 1200)  Labs: Recent Labs    06/15/20 2215 06/16/20 1240 06/16/20 2128 06/17/20 0353 06/18/20 0333 06/18/20 1012 06/18/20 1816  HGB 8.8* 9.0*  --  8.0* 8.1*  --   --   HCT 29.6* 29.2*  --  26.7* 26.5*  --   --   PLT 255  --   --  178 186  --   --   APTT  --   --    < > 70*  --  155* 98*  LABPROT 21.1*  --   --  19.7*  --   --   --   INR 1.9*  --   --  1.7*  --   --   --   HEPARINUNFRC  --   --   --  2.18* 1.28*  --  0.61  CREATININE 1.76* 1.63*  --  1.38* 1.12*  --   --    < > = values in this interval not displayed.    Estimated Creatinine Clearance: 60.1 mL/min (A) (by C-G formula based on SCr of 1.12 mg/dL (H)).    Assessment: 65 year old W with PTA apixaban for Afib held, LD 1/10 @ 9am. Pharmacy consulted to start IV heparin for atrial fibrillation.   APTT 98 and HL 0.61 - appear to be correlating and therapeutic for goal on rate of 1050 units/hr. H/H & plt stable. Noted patient had a small nose bleed 1/13 AM that is resolved, also FOBT positive, pending GI recs. Will follow only heparin levels going forward.   Goal of Therapy:  Heparin level 0.3-0.7 units/ml aPTT 66-102 seconds Monitor platelets by anticoagulation protocol: Yes   Plan:  Continue IV Heparin at 1050 units/hr  Daily  heparin level, CBC Monitor s/sx bleeding  Sloan Leiter, PharmD, BCPS, BCCCP Clinical Pharmacist  Please check AMION for all [place] phone numbers After 10:00 PM, call [place] [phone]

## 2020-06-18 NOTE — Progress Notes (Signed)
Patient's stool hemoccult positive. Dr. Alfonse Spruce notified via [facility] communication.

## 2020-06-18 NOTE — Progress Notes (Signed)
HD#2 Subjective:   No acute events overnight.  Ms. Rhonda [name] was seen at bedside this AM. She states that she currently is having a bloody nose and cough. Her nose started bleeding several minutes ago, and she has been intermittently holding her nose.   She states she saw the cardiologists this morning who recommended removing fluid. She states she did not receive her medications for her probable liver cirrhosis, but knows she will get it today.   Objective:   Vital signs in last 24 hours: Vitals:   06/18/20 0048 06/18/20 0300 06/18/20 0532 06/18/20 0610  BP: 96/66 (!) 100/57 (!) 93/59 93/70  Pulse: 99  80 (!) 57  Resp: 20  (!) 22 18  Temp: 97.7 F (36.5 C) 98.5 F (36.9 C) 98.3 F (36.8 C) 98 F (36.7 C)  TempSrc: Oral Oral Oral   SpO2: 96% 96% 94% 93%  Weight:  104.1 kg    Height:       Physical Exam Constitutional: pleasant and well-appearing woman sitting on edge of bed, in no acute distress Cardiovascular: irregularly irregular rhythm, no m/r/g; 3+ pitting edema to the ankles, SCDs in place Pulmonary/Chest: normal work of breathing on 2 L [place] Abdominal: less soft than yesterday, more distended than yesterday, no obvious fluid wave Neurological: alert & oriented x 3  Filed Weights   06/16/20 1642 06/17/20 0259 06/18/20 0300  Weight: 103.3 kg 104.3 kg 104.1 kg    Pertinent Labs: CBC Latest Ref Rng & Units 06/18/2020 06/17/2020 06/16/2020  WBC 4.0 - 10.5 K/uL 5.3 5.5 -  Hemoglobin 12.0 - 15.0 g/dL 8.1(L) 8.0(L) 9.0(L)  Hematocrit 36.0 - 46.0 % 26.5(L) 26.7(L) 29.2(L)  Platelets 150 - 400 K/uL 186 178 -    CMP Latest Ref Rng & Units 06/18/2020 06/17/2020 06/16/2020  Glucose 70 - 99 mg/dL 95 93 106(H)  BUN 8 - 23 mg/dL 17 23 31(H)  Creatinine 0.44 - 1.00 mg/dL 1.12(H) 1.38(H) 1.63(H)  Sodium 135 - 145 mmol/L 139 136 136  Potassium 3.5 - 5.1 mmol/L 4.5 3.4(L) 3.3(L)  Chloride 98 - 111 mmol/L 97(L) 93(L) 93(L)  CO2 22 - 32 mmol/L 34(H) 32 31  Calcium 8.9 - 10.3  mg/dL 9.1 8.9 9.4  Total Protein 6.5 - 8.1 g/dL - 5.7(L) 6.5  Total Bilirubin 0.3 - 1.2 mg/dL - 1.9(H) 2.3(H)  Alkaline Phos 38 - 126 U/L - 75 79  AST 15 - 41 U/L - 19 22  ALT 0 - 44 U/L - 17 18    Imaging: ECHOCARDIOGRAM LIMITED  Result Date: 06/17/2020    ECHOCARDIOGRAM LIMITED REPORT   Patient Name:   Rhonda [name] Date of Exam: 06/17/2020 Medical Rec #:  [id number]            Height:       63.0 in Accession #:    [id number]           Weight:       229.9 lb Date of Birth:  03/21/1956            BSA:          2.052 m Patient Age:    65 years             BP:           110/64 mmHg Patient Gender: F                    HR:  93 bpm. Exam Location:  Inpatient Procedure: Limited Echo, Limited Color Doppler and Cardiac Doppler Indications:    Congestive Heart Failure  History:        Patient has prior history of Echocardiogram examinations, most                 recent 01/03/2020. CAD, COVID-19 Positive; Risk                 Factors:Hypertension.  Sonographer:    Mikki Santee RDCS (AE) Referring Phys: [id number] [place]  1. Left ventricular ejection fraction, by estimation, is 50 to 55%. The left ventricle has low normal function. The left ventricle has no regional wall motion abnormalities. There is mild left ventricular hypertrophy.  2. Right ventricular systolic function is moderately reduced. The right ventricular size is moderately enlarged. There is moderately elevated pulmonary artery systolic pressure.  3. Left atrial size was severely dilated.  4. Right atrial size was severely dilated.  5. Eccentric posteriorly directed MR likely related to restricted posterior leaflet motion and atrial enlargement . The mitral valve is abnormal. Moderate mitral valve regurgitation. No evidence of mitral stenosis.  6. Tricuspid valve regurgitation is severe.  7. The aortic valve is normal in structure. Aortic valve regurgitation is not visualized. Mild aortic valve sclerosis is  present, with no evidence of aortic valve stenosis.  8. The inferior vena cava is dilated in size with <50% respiratory variability, suggesting right atrial pressure of 15 mmHg. FINDINGS  Left Ventricle: Left ventricular ejection fraction, by estimation, is 50 to 55%. The left ventricle has low normal function. The left ventricle has no regional wall motion abnormalities. The left ventricular internal cavity size was normal in size. There is mild left ventricular hypertrophy. Right Ventricle: The right ventricular size is moderately enlarged. Right vetricular wall thickness was not assessed. Right ventricular systolic function is moderately reduced. There is moderately elevated pulmonary artery systolic pressure. The tricuspid regurgitant velocity is 3.32 m/s, and with an assumed right atrial pressure of 8 mmHg, the estimated right ventricular systolic pressure is 81.8 mmHg. Left Atrium: Left atrial size was severely dilated. Right Atrium: Right atrial size was severely dilated. Pericardium: There is no evidence of pericardial effusion. Mitral Valve: Eccentric posteriorly directed MR likely related to restricted posterior leaflet motion and atrial enlargement. The mitral valve is abnormal. There is mild thickening of the mitral valve leaflet(s). There is mild calcification of the mitral  valve leaflet(s). Moderate mitral valve regurgitation. No evidence of mitral valve stenosis. Tricuspid Valve: The tricuspid valve is normal in structure. Tricuspid valve regurgitation is severe. No evidence of tricuspid stenosis. Aortic Valve: The aortic valve is normal in structure. Aortic valve regurgitation is not visualized. Mild aortic valve sclerosis is present, with no evidence of aortic valve stenosis. Pulmonic Valve: The pulmonic valve was normal in structure. Pulmonic valve regurgitation is not visualized. No evidence of pulmonic stenosis. Aorta: The aortic root is normal in size and structure. Venous: The inferior vena  cava is dilated in size with less than 50% respiratory variability, suggesting right atrial pressure of 15 mmHg. IAS/Shunts: No atrial level shunt detected by color flow Doppler. LEFT VENTRICLE PLAX 2D LVIDd:         5.90 cm LVIDs:         4.10 cm LV PW:         1.20 cm LV IVS:        1.20 cm LVOT diam:     2.30 cm  LVOT Area:     4.15 cm  LEFT ATRIUM              Index       RIGHT ATRIUM           Index LA diam:        6.60 cm  3.22 cm/m  RA Area:     28.60 cm LA Vol (A2C):   142.0 ml 69.21 ml/m RA Volume:   91.40 ml  44.55 ml/m LA Vol (A4C):   178.0 ml 86.76 ml/m LA Biplane Vol: 162.0 ml 78.96 ml/m   AORTA Ao Root diam: 2.90 cm MR Peak grad: 90.2 mmHg   TRICUSPID VALVE MR Mean grad: 57.0 mmHg   TR Peak grad:   44.1 mmHg MR Vmax:      475.00 cm/s TR Vmax:        332.00 cm/s MR Vmean:     356.0 cm/s                           SHUNTS                           Systemic Diam: 2.30 cm Jenkins Rouge MD Electronically signed by Jenkins Rouge MD Signature Date/Time: 06/17/2020/12:06:13 PM    Final     Assessment/Plan:   Active Problems:   Acute exacerbation of CHF (congestive heart failure) (HCC)   Volume overload   Alcoholic cirrhosis of liver with ascites [facility])   Patient Summary: Khristian Seals is a 65 y.o. woman with history of HFpEF, history of heavy alcohol use, permanent atrial fibrillation on Eliquis who presented with increased work of breathing and sign of volume overload and admitted due to concern for acute exacerbation of congestive heart failure and possible cirrhosis.  This is hospital day 2.  Acute on chronic heart failure with preserved ejection fraction possibly complicated by probably alcoholic cirrhosis Patient reports improvement in dyspnea and abdominal distension with initial IV diuresis. Lasix and spironolactone were held yesterday due to patient's soft BP (80s-110s/50s-70).  Medications should be given today.   - Unna boots for compression - Cardiology consulted,  appreciate their recommendations - Continue diuresis: Lasix 40 mg IV BID, spironolactone 200 mg daily - Patient continued on heparin due to possibility of cardiac intervention once volume status improves  Probable alcoholic cirrhosis RUQ ultrasound with changes consistent with cirrhosis and mild perihepatic ascites. On exam, no asterixis or spider angiomata. Possible palmar erythema. Per GI, there is likely a component of 'cardiogenic' cirrhosis given her severe tricuspid regurgitation.  - GI consulted, recommend addition of spironolactone as above - Use SBP of 90 as a cutoff to hold diuretics rather than 100 - Viral hepatology panel negative - Autoimmune markers pending  Iron deficiency anemia Heme positive stool Labs consistent with IDA. Will administer IV iron due to concomitant heart failure. GI recommend further workup with colonoscopy and EGD, however prefer fluid status to be more stable and recovered from COVID. - IV iron infusion: Nulecit 250 mg x 3 d (Day 1: 06/17/20)  COVID-19 positive in setting of vaccinated and boosted Patient has dyspnea however this is more likely secondary to heart failure exacerbation. In addition, she is vaccinated and boosted. Will not treat with remdesivir or steroids at this time.  Atrial fibrillation on Eliquis No RVR. Per cardiology, continue metoprolol but discontinue Cardizem. Add digoxin. - Holding home Eliquis; heparin per cardiology  -  metoprolol 100 mg BID - digoxin 0.125 mg daily  Moderate to severe mitral regurgitation Repeat Echo stable from prior. - Per cards, may need TEE to further assess pending improvement in volume status  CKD stage III sCr 1.63->1.38->1.12 this morning. - Continue to monitor  Diet: heart healthy, carb modified IVF: None VTE: Heparin Code: Full   Please contact the on call pager after 5 pm and on weekends at [phone].  Alexandria Lodge, MD PGY-1 Internal Medicine Teaching Service Pager:  [phone] 06/18/2020

## 2020-06-19 ENCOUNTER — Other Ambulatory Visit: Payer: Self-pay

## 2020-06-19 ENCOUNTER — Telehealth: Payer: Self-pay

## 2020-06-19 DIAGNOSIS — I5033 Acute on chronic diastolic (congestive) heart failure: Secondary | ICD-10-CM

## 2020-06-19 DIAGNOSIS — I4819 Other persistent atrial fibrillation: Secondary | ICD-10-CM | POA: Diagnosis not present

## 2020-06-19 DIAGNOSIS — K7031 Alcoholic cirrhosis of liver with ascites: Secondary | ICD-10-CM

## 2020-06-19 LAB — BASIC METABOLIC PANEL
Anion gap: 10 (ref 5–15)
BUN: 14 mg/dL (ref 8–23)
CO2: 32 mmol/L (ref 22–32)
Calcium: 9.1 mg/dL (ref 8.9–10.3)
Chloride: 94 mmol/L — ABNORMAL LOW (ref 98–111)
Creatinine, Ser: 0.96 mg/dL (ref 0.44–1.00)
GFR, Estimated: >60 mL/min (ref >60)
Glucose, Bld: 82 mg/dL (ref 70–99)
Potassium: 3.9 mmol/L (ref 3.5–5.1)
Sodium: 136 mmol/L (ref 135–145)

## 2020-06-19 LAB — CBC
HCT: 29 % — ABNORMAL LOW (ref 36.0–46.0)
Hemoglobin: 8.4 g/dL — ABNORMAL LOW (ref 12.0–15.0)
MCH: 26.5 pg (ref 26.0–34.0)
MCHC: 29 g/dL — ABNORMAL LOW (ref 30.0–36.0)
MCV: 91.5 fL (ref 80.0–100.0)
Platelets: 190 10*3/uL (ref 150–400)
RBC: 3.17 MIL/uL — ABNORMAL LOW (ref 3.87–5.11)
RDW: 16.7 % — ABNORMAL HIGH (ref 11.5–15.5)
WBC: 5.7 10*3/uL (ref 4.0–10.5)
nRBC: 0 % (ref 0.0–0.2)

## 2020-06-19 LAB — ANTI-SMOOTH MUSCLE ANTIBODY, IGG: F-Actin IgG: 9 Units (ref 0–19)

## 2020-06-19 LAB — MAGNESIUM: Magnesium: 1.7 mg/dL (ref 1.7–2.4)

## 2020-06-19 LAB — HEPARIN LEVEL (UNFRACTIONATED): Heparin Unfractionated: 0.37 IU/mL (ref 0.30–0.70)

## 2020-06-19 LAB — MITOCHONDRIAL ANTIBODIES: Mitochondrial M2 Ab, IgG: <20 Units (ref 0.0–20.0)

## 2020-06-19 MED ORDER — MAGNESIUM SULFATE 2 GM/50ML IV SOLN
2.0000 g | Freq: Once | INTRAVENOUS | Status: AC
  Administered 2020-06-19: 2 g via INTRAVENOUS
  Filled 2020-06-19: qty 50

## 2020-06-19 NOTE — Progress Notes (Addendum)
Daily Rounding Note  06/19/2020, 11:53 AM  LOS: 3 days   SUBJECTIVE:   Chief complaint: New diagnosis of cirrhosis.  FOBT positive anemia.  Spoke with patient on the phone.  She is having no abdominal discomfort, the sense of bloating has resolved.  Eating well.  No nausea or vomiting.  No dark stools.  OBJECTIVE:         Vital signs in last 24 hours:    Temp:  [98.1 F (36.7 C)-98.9 F (37.2 C)] 98.7 F (37.1 C) (01/14 1100) Pulse Rate:  [63-106] 98 (01/14 1100) Resp:  [18-22] 20 (01/14 1100) BP: (86-115)/(58-83) 115/83 (01/14 1100) SpO2:  [92 %-99 %] 98 % (01/14 1100) Last BM Date: 06/27/20 Filed Weights   06/16/20 1642 06/17/20 0259 06/18/20 0300  Weight: 103.3 kg 104.3 kg 104.1 kg   Patient was not reexamined.  Did speak with her on the phone.  Not confused, fluid speech.  Good historian.  Intake/Output from previous day: 01/13 0701 - 01/14 0700 In: 1090.3 [P.O.:960; I.V.:21.2; IV Piggyback:109.1] Out: 5000 [Urine:5000]  Intake/Output this shift: No intake/output data recorded.  Lab Results: Recent Labs    06/17/20 0353 06/18/20 0333 06/19/20 0417  WBC 5.5 5.3 5.7  HGB 8.0* 8.1* 8.4*  HCT 26.7* 26.5* 29.0*  PLT 178 186 190   BMET Recent Labs    06/17/20 0353 06/18/20 0333 06/19/20 0417  NA 136 139 136  K 3.4* 4.5 3.9  CL 93* 97* 94*  CO2 32 34* 32  GLUCOSE 93 95 82  BUN 23 17 14   CREATININE 1.38* 1.12* 0.96  CALCIUM 8.9 9.1 9.1   LFT Recent Labs    06/16/20 1240 06/17/20 0353  PROT 6.5 5.7*  ALBUMIN 3.4* 2.9*  AST 22 19  ALT 18 17  ALKPHOS 79 75  BILITOT 2.3* 1.9*   PT/INR Recent Labs    06/17/20 0353  LABPROT 19.7*  INR 1.7*   Hepatitis Panel Recent Labs    06/16/20 2128  HEPBSAG NON REACTIVE  HCVAB NON REACTIVE  HEPAIGM NON REACTIVE    Studies/Results: No results found.   Scheduled Meds: . digoxin  0.125 mg Oral Daily  . furosemide  40 mg Intravenous BID   . metoprolol tartrate  100 mg Oral BID  . spironolactone  200 mg Oral Daily   Continuous Infusions: . heparin 1,050 Units/hr (06/18/20 1459)  . magnesium sulfate bolus IVPB 2 g (06/19/20 1059)   PRN Meds:.albuterol   ASSESMENT:   *   Diagnosis of cirrhosis, likely due to chronic alcoholism.  Last EtOH 06/06/2020. Severe tricuspid regurgitation on echo, raises the possibility of cardiogenic component to her cirrhosis. Hep ABC serologies negative. ANA, mitochondrial antibodies, smooth muscle antibodies all pending to assess for autoimmune component to liver disease.    *   FOBT + w/o overt bleeding.  *    Normocytic anemia.  Stable Hgb, 8 >> 8.4 over the last 3 days IDA w low iron, low iron saturation, ferritin 12.  Elevated TIBC.  Normal folate.  , Normal B12. Received ferric gluconate infusion 1/13.  *    Acute on chronic diastolic heart failure.  On Hihg dose IV Lasix, po  spironolactone. Echocardiogram with preserved LV function, moderate mitral regurgitation, severe tricuspid regurgitation.  *    Permanent atrial fibrillation.  Apixaban on hold, IV heparin in place  *    COVID-19 positive.  Developing Right basilar infiltrate vs right basilar atelectasis on  06/15/2020 CXR.   Not received remdesivir or steroids.  *    Coagulopathy.  INR 1.9 >> 1.7.  Difficult to gauge coagulopathy in the presence of apixaban.  *   Abdominal swelling, progressive lower extremity edema.  Mild perihepatic ascites on 06/16/2020 abdominal ultrasound   PLAN   *   Unless urgently required, plan EGD and colonoscopy as outpatient.  Need to allowed her time to recover from COVID-19 infection.  *    GI signing off.  I spoke with patient and she has a pre-existing appointment with Apolonio Schneiders on 1/26, and she is aware that she should keep this appointment.  From that visit future EGD and likely colonoscopy (average risk screening) will be scheduled.     Rhonda [name]  06/19/2020, 11:53 AM Phone [phone]  [phone]  ________________________________________________________________________  Velora Heckler GI MD note:  I reviewed the data and agree with the assessment and plan described above.  She has appt with [place] in about 2 weeks in our office. Will make sure labs are checked shortly before that appointment. Most important is getting her fluid, edema under better control.  Should eventually send her home on lasix 80mg  daily and aldactone 200mg  daily.   Owens Loffler, MD [facility] Pager [phone]

## 2020-06-19 NOTE — Plan of Care (Signed)
  Problem: Education: Goal: Knowledge of General Education information will improve Description: Including pain rating scale, medication(s)/side effects and non-pharmacologic comfort measures Outcome: Progressing   Problem: Clinical Measurements: Goal: Ability to maintain clinical measurements within normal limits will improve Outcome: Progressing Goal: Will remain free from infection Outcome: Progressing Goal: Diagnostic test results will improve Outcome: Progressing Goal: Respiratory complications will improve Outcome: Progressing   Problem: Activity: Goal: Risk for activity intolerance will decrease Outcome: Progressing   Problem: Coping: Goal: Level of anxiety will decrease Outcome: Progressing   Problem: Elimination: Goal: Will not experience complications related to bowel motility Outcome: Progressing Goal: Will not experience complications related to urinary retention Outcome: Progressing   Problem: Pain Managment: Goal: General experience of comfort will improve Outcome: Progressing   Problem: Safety: Goal: Ability to remain free from injury will improve Outcome: Progressing   Problem: Skin Integrity: Goal: Risk for impaired skin integrity will decrease Outcome: Progressing   Problem: Education: Goal: Knowledge of risk factors and measures for prevention of condition will improve Outcome: Progressing   Problem: Coping: Goal: Psychosocial and spiritual needs will be supported Outcome: Progressing   Problem: Respiratory: Goal: Will maintain a patent airway Outcome: Progressing Goal: Complications related to the disease process, condition or treatment will be avoided or minimized Outcome: Progressing

## 2020-06-19 NOTE — Telephone Encounter (Signed)
-----   Message from Milus Banister, MD sent at 06/19/2020  1:28 PM EST ----- She is seeing jennifer in a couple weeks in office.  She needs cbc, cmet, inr a few days prior.  Thanks

## 2020-06-19 NOTE — Progress Notes (Signed)
Progress Note  Patient Name: Rhonda [name] Date of Encounter: 06/19/2020  [facility] Cardiologist: Elouise Munroe, MD   Subjective   Some improvement in dyspnea; no CP  Inpatient Medications    Scheduled Meds: . digoxin  0.125 mg Oral Daily  . furosemide  40 mg Intravenous BID  . metoprolol tartrate  100 mg Oral BID  . spironolactone  200 mg Oral Daily   Continuous Infusions: . heparin 1,050 Units/hr (06/18/20 1459)   PRN Meds: albuterol   Vital Signs    Vitals:   06/18/20 2211 06/19/20 0353 06/19/20 0800 06/19/20 0803  BP: (!) 86/58 111/71  99/69  Pulse: 92 (!) 106 63   Resp: 20 20 18 18   Temp:  98.1 F (36.7 C)  98.9 F (37.2 C)  TempSrc:  Oral  Oral  SpO2: 96% 99% 92%   Weight:      Height:        Intake/Output Summary (Last 24 hours) at 06/19/2020 0909 Last data filed at 06/19/2020 0400 Gross per 24 hour  Intake 850.26 ml  Output 5000 ml  Net -4149.74 ml   Last 3 Weights 06/18/2020 06/17/2020 06/16/2020  Weight (lbs) 229 lb 8 oz 229 lb 15 oz 227 lb 11.8 oz  Weight (kg) 104.101 kg 104.3 kg 103.3 kg      Telemetry    Atrial fibrillation with PVCs or aberrantly conducted beats; rate upper normal to mildly elevated- Personally Reviewed  Physical Exam   GEN: NAD, obese  Neck: supple Cardiac: irregular; 3/6 systolic murmur apex; mildly tachycardic Respiratory: CTA GI: Soft, + ascites MS: 2+ edema worse at ankles Neuro:  No focal findings Psych: Normal affect   Labs    Chemistry Recent Labs  Lab 06/12/20 1454 06/15/20 2215 06/16/20 1240 06/17/20 0353 06/18/20 0333 06/19/20 0417  NA 138   < > 136 136 139 136  K 4.0   < > 3.3* 3.4* 4.5 3.9  CL 92*   < > 93* 93* 97* 94*  CO2 30*   < > 31 32 34* 32  GLUCOSE 117*   < > 106* 93 95 82  BUN 29*   < > 31* 23 17 14   CREATININE 1.65*   < > 1.63* 1.38* 1.12* 0.96  CALCIUM 9.9   < > 9.4 8.9 9.1 9.1  PROT  --   --  6.5 5.7*  --   --   ALBUMIN  --   --  3.4* 2.9*  --   --   AST  --    --  22 19  --   --   ALT  --   --  18 17  --   --   ALKPHOS  --   --  79 75  --   --   BILITOT  --   --  2.3* 1.9*  --   --   GFRNONAA 33*   < > 35* 43* 56* >60  GFRAA 38*  --   --   --   --   --   ANIONGAP  --    < > 12 11 8 10    < > = values in this interval not displayed.     Hematology Recent Labs  Lab 06/17/20 0353 06/18/20 0333 06/19/20 0417  WBC 5.5 5.3 5.7  RBC 3.01* 2.96* 3.17*  HGB 8.0* 8.1* 8.4*  HCT 26.7* 26.5* 29.0*  MCV 88.7 89.5 91.5  MCH 26.6 27.4 26.5  MCHC 30.0 30.6  29.0*  RDW 16.8* 17.0* 16.7*  PLT 178 186 190    BNP Recent Labs  Lab 06/12/20 1454 06/15/20 2215  BNP  --  609.0*  PROBNP 732*  --       Radiology    ECHOCARDIOGRAM LIMITED  Result Date: 06/17/2020    ECHOCARDIOGRAM LIMITED REPORT   Patient Name:   [name] Date of Exam: 06/17/2020 Medical Rec #:  [id number]            Height:       63.0 in Accession #:    [id number]           Weight:       229.9 lb Date of Birth:  02/19/1956            BSA:          2.052 m Patient Age:    60 years             BP:           110/64 mmHg Patient Gender: F                    HR:           93 bpm. Exam Location:  Inpatient Procedure: Limited Echo, Limited Color Doppler and Cardiac Doppler Indications:    Congestive Heart Failure  History:        Patient has prior history of Echocardiogram examinations, most                 recent 01/03/2020. CAD, COVID-19 Positive; Risk                 Factors:Hypertension.  Sonographer:    Mikki Santee RDCS (AE) Referring Phys: [id number] [place]  1. Left ventricular ejection fraction, by estimation, is 50 to 55%. The left ventricle has low normal function. The left ventricle has no regional wall motion abnormalities. There is mild left ventricular hypertrophy.  2. Right ventricular systolic function is moderately reduced. The right ventricular size is moderately enlarged. There is moderately elevated pulmonary artery systolic pressure.  3. Left  atrial size was severely dilated.  4. Right atrial size was severely dilated.  5. Eccentric posteriorly directed MR likely related to restricted posterior leaflet motion and atrial enlargement . The mitral valve is abnormal. Moderate mitral valve regurgitation. No evidence of mitral stenosis.  6. Tricuspid valve regurgitation is severe.  7. The aortic valve is normal in structure. Aortic valve regurgitation is not visualized. Mild aortic valve sclerosis is present, with no evidence of aortic valve stenosis.  8. The inferior vena cava is dilated in size with <50% respiratory variability, suggesting right atrial pressure of 15 mmHg. FINDINGS  Left Ventricle: Left ventricular ejection fraction, by estimation, is 50 to 55%. The left ventricle has low normal function. The left ventricle has no regional wall motion abnormalities. The left ventricular internal cavity size was normal in size. There is mild left ventricular hypertrophy. Right Ventricle: The right ventricular size is moderately enlarged. Right vetricular wall thickness was not assessed. Right ventricular systolic function is moderately reduced. There is moderately elevated pulmonary artery systolic pressure. The tricuspid regurgitant velocity is 3.32 m/s, and with an assumed right atrial pressure of 8 mmHg, the estimated right ventricular systolic pressure is 123XX123 mmHg. Left Atrium: Left atrial size was severely dilated. Right Atrium: Right atrial size was severely dilated. Pericardium: There is no evidence of pericardial effusion. Mitral Valve: Eccentric posteriorly directed  MR likely related to restricted posterior leaflet motion and atrial enlargement. The mitral valve is abnormal. There is mild thickening of the mitral valve leaflet(s). There is mild calcification of the mitral  valve leaflet(s). Moderate mitral valve regurgitation. No evidence of mitral valve stenosis. Tricuspid Valve: The tricuspid valve is normal in structure. Tricuspid valve  regurgitation is severe. No evidence of tricuspid stenosis. Aortic Valve: The aortic valve is normal in structure. Aortic valve regurgitation is not visualized. Mild aortic valve sclerosis is present, with no evidence of aortic valve stenosis. Pulmonic Valve: The pulmonic valve was normal in structure. Pulmonic valve regurgitation is not visualized. No evidence of pulmonic stenosis. Aorta: The aortic root is normal in size and structure. Venous: The inferior vena cava is dilated in size with less than 50% respiratory variability, suggesting right atrial pressure of 15 mmHg. IAS/Shunts: No atrial level shunt detected by color flow Doppler. LEFT VENTRICLE PLAX 2D LVIDd:         5.90 cm LVIDs:         4.10 cm LV PW:         1.20 cm LV IVS:        1.20 cm LVOT diam:     2.30 cm LVOT Area:     4.15 cm  LEFT ATRIUM              Index       RIGHT ATRIUM           Index LA diam:        6.60 cm  3.22 cm/m  RA Area:     28.60 cm LA Vol (A2C):   142.0 ml 69.21 ml/m RA Volume:   91.40 ml  44.55 ml/m LA Vol (A4C):   178.0 ml 86.76 ml/m LA Biplane Vol: 162.0 ml 78.96 ml/m   AORTA Ao Root diam: 2.90 cm MR Peak grad: 90.2 mmHg   TRICUSPID VALVE MR Mean grad: 57.0 mmHg   TR Peak grad:   44.1 mmHg MR Vmax:      475.00 cm/s TR Vmax:        332.00 cm/s MR Vmean:     356.0 cm/s                           SHUNTS                           Systemic Diam: 2.30 cm Jenkins Rouge MD Electronically signed by Jenkins Rouge MD Signature Date/Time: 06/17/2020/12:06:13 PM    Final     Patient Profile     65 year old female with past medical history of chronic diastolic congestive heart failure, coronary artery disease with prior PCI, hypertension, persistent atrial fibrillation, history of heavy alcohol abuse admitted with acute on chronic diastolic congestive heart failure and possible cirrhosis.  Last echocardiogram July 2021 showed ejection fraction 50 to 55%, mild left ventricular enlargement, moderate pulmonary hypertension, biatrial  enlargement, restricted posterior mitral valve leaflet with moderate to severe mitral regurgitation, moderate tricuspid regurgitation.    Assessment & Plan    1 acute on chronic diastolic congestive heart failure-[id number]. Continue lasix and spironolactone at present dose. Follow renal function with diuresis.  Echocardiogram shows preserved LV function and moderate mitral regurgitation.  Volume excess is felt secondary to a combination of diastolic congestive heart failure and cirrhosis.  2 cirrhosis-patient with long history of alcohol abuse and abdominal ultrasound shows cirrhosis.  INR was also elevated at time of admission.  Gastroenterology has evaluated and spironolactone has been added.  Further work-up planned as outpt.  3 permanent atrial fibrillation-Cardizem discontinued previously as blood pressure was borderline.  Continue metoprolol.  Digoxin has also been added.  May need to resume low-dose Cardizem pending follow-up heart rate.  Continue heparin.  Resume apixaban when it is clear all procedures complete.     4 mitral regurgitation-moderate on most recent transthoracic echocardiogram.  May need transesophageal echocardiogram to further assess pending above evaluation and response to therapy.  May also require right and left cardiac catheterization though the predominant issue at this point appears to be cirrhosis.  Can consider above evaluation once volume status improves.  5 chronic stage III kidney disease-creatinine improving with diuresis; continue to follow.  6 normocytic anemia-gastroenterology plans EGD and colonoscopy in several weeks after COVID infection resolved.  7 COVID-management per primary care.  For questions or updates, please contact [place] Please consult [email or web address] for contact info under        Signed, Kirk Ruths, MD  06/19/2020, 9:09 AM

## 2020-06-19 NOTE — Progress Notes (Signed)
HD#3 Subjective:   No acute overnight events.  Reports feeling good today. Still no symptoms from COVID infection.  Discussed plan going forward, including GI and cardio recommendations. Patient agrees with plan. All questions and concerns addressed.  Objective:   Vital signs in last 24 hours: Vitals:   06/19/20 0800 06/19/20 0803 06/19/20 1051 06/19/20 1100  BP:  99/69 106/75 115/83  Pulse: 63  (!) 102 98  Resp: 18 18 (!) 22 20  Temp:  98.9 F (37.2 C) 98.3 F (36.8 C) 98.7 F (37.1 C)  TempSrc:  Oral Oral   SpO2: 92%  97% 98%  Weight:      Height:       Physical Exam General: pleasant and well-appearing woman, sitting on edge of bed, NAD. CV: irregularly irregular rhythm, no m/r/g, 2+ pitting edema to ankles bilaterally. SCDs in place. Pulm: CTABL, no adventitious sounds noted. Abdomen: soft, moderately distended, nontender. +BS. No noticeable fluid wave on exam. Neuro: AAOx3   Filed Weights   06/16/20 1642 06/17/20 0259 06/18/20 0300  Weight: 103.3 kg 104.3 kg 104.1 kg    Pertinent Labs: CBC Latest Ref Rng & Units 06/19/2020 06/18/2020 06/17/2020  WBC 4.0 - 10.5 K/uL 5.7 5.3 5.5  Hemoglobin 12.0 - 15.0 g/dL 8.4(L) 8.1(L) 8.0(L)  Hematocrit 36.0 - 46.0 % 29.0(L) 26.5(L) 26.7(L)  Platelets 150 - 400 K/uL 190 186 178    CMP Latest Ref Rng & Units 06/19/2020 06/18/2020 06/17/2020  Glucose 70 - 99 mg/dL 82 95 93  BUN 8 - 23 mg/dL 14 17 23   Creatinine 0.44 - 1.00 mg/dL 0.96 1.12(H) 1.38(H)  Sodium 135 - 145 mmol/L 136 139 136  Potassium 3.5 - 5.1 mmol/L 3.9 4.5 3.4(L)  Chloride 98 - 111 mmol/L 94(L) 97(L) 93(L)  CO2 22 - 32 mmol/L 32 34(H) 32  Calcium 8.9 - 10.3 mg/dL 9.1 9.1 8.9  Total Protein 6.5 - 8.1 g/dL - - 5.7(L)  Total Bilirubin 0.3 - 1.2 mg/dL - - 1.9(H)  Alkaline Phos 38 - 126 U/L - - 75  AST 15 - 41 U/L - - 19  ALT 0 - 44 U/L - - 17    Imaging: No results found.  Assessment/Plan:   Active Problems:   Acute exacerbation of CHF (congestive  heart failure) (HCC)   Volume overload   Alcoholic cirrhosis of liver with ascites [facility])   Patient Summary: Rhonda [name] is a 65 y.o. woman with history of HFpEF, history of heavy alcohol use, permanent atrial fibrillation on Eliquis who presented with increased work of breathing and sign of volume overload and admitted due to concern for acute exacerbation of congestive heart failure and alcoholic cirrhosis.  This is hospital day 3.  AoC HFpEF (EF 51-70%) Alcoholic cirrhosis +/- possible cardiogenic cirrhosis (given severe TR regurg) Moderate to severe mitral regurgitation Hx of chronic alcoholism, last drink on 06/06/20. GI and cardiology following, greatly appreciate recommendations. On IV lasix 40mg  BID and spironolactone 200mg  daily. ECHO showing EF 50-55%, moderate MR, and severe TR. Cardiology to consider TEE and left and right heart catheterization once patient's fluid status stabilizes for further evaluation of mitral regurg. RUQ U/S showing findings consistent with cirrhosis and mild perihepatic ascites. Given severe TR, there may be a cardiogenic component to cirrhosis along with known long term alcohol use.  - Awaiting UNNA boot placement for compression - Continue lasix and spiro at current dose, but hold for SBP < 90 - Mg 1.7 this AM, will replete  with 2g - Patient continued on IV heparin due to possibility of cardiac intervention once volume status improves - Autoimmune markers pending - viral serologies negative  AKI - resolving Renal function significantly improved since admission. - sCr 1.63 > 1.38 > 1.12 > 0.96 - monitor renal function  Iron deficiency anemia Heme positive stool Labs consistent with IDA. Will administer IV iron due to concomitant heart failure. GI recommending further workup with colonoscopy and EGD, but after patient's fluid status is more stable and she has recovered from [place]. -on day 3 of IV Nulecit 250mg  (last day today) -Hgb stable, 8.1 >  8.4  COVID-19 positive in setting of vaccinated and boosted Patient has dyspnea however this is more likely secondary to heart failure exacerbation. In addition, she is vaccinated and boosted. Will not treat with remdesivir or steroids at this time.  Atrial fibrillation on Eliquis No RVR. Per cardiology, continue metoprolol but discontinue Cardizem. Added digoxin. - Holding home Eliquis; heparin per cardiology  - metoprolol 100 mg BID - digoxin 0.125 mg daily   Diet: heart healthy, carb modified IVF: None VTE: Heparin Code: Full   Please contact the on call pager after 5 pm and on weekends at [phone].  Virl Axe, MD  PGY-1, IMTS 06/19/2020, 1:10 PM

## 2020-06-19 NOTE — Telephone Encounter (Signed)
Labs have been entered and pt aware come for labs prior to the upcoming appt with Janett Billow.

## 2020-06-19 NOTE — Progress Notes (Signed)
ANTICOAGULATION CONSULT NOTE - Follow Up Consult  Pharmacy Consult for IV heparin Indication: atrial fibrillation  Allergies  Allergen Reactions  . Ace Inhibitors Cough  . Prednisone Itching and Swelling    Itching on leg and arms swelling from knee to ankles and felt restless    Patient Measurements: Height: 5\' 3"  (160 cm) Weight: 104.1 kg (229 lb 8 oz) (scale C) IBW/kg (Calculated) : 52.4 Heparin Dosing Weight: 77 kg  Vital Signs: Temp: 98.9 F (37.2 C) (01/14 0803) Temp Source: Oral (01/14 0803) BP: 99/69 (01/14 0803) Pulse Rate: 63 (01/14 0800)  Labs: Recent Labs    06/17/20 0353 06/18/20 0333 06/18/20 1012 06/18/20 1816 06/19/20 0417  HGB 8.0* 8.1*  --   --  8.4*  HCT 26.7* 26.5*  --   --  29.0*  PLT 178 186  --   --  190  APTT 70*  --  155* 98*  --   LABPROT 19.7*  --   --   --   --   INR 1.7*  --   --   --   --   HEPARINUNFRC 2.18* 1.28*  --  0.61 0.37  CREATININE 1.38* 1.12*  --   --  0.96    Estimated Creatinine Clearance: 70.1 mL/min (by C-G formula based on SCr of 0.96 mg/dL).    Assessment: 65 year old W with PTA apixaban for Afib held, LD 1/10 @ 9am. Pharmacy consulted to start IV heparin for atrial fibrillation.   Heparin level is therapeutic at 0.37, on 1050 units/hr. hgb 8.4, plt 190. No s/sx of bleeding or infusion issues. Was FOBT positive - GI following, nose bleed yesterday but resolved.  Goal of Therapy:  Heparin level 0.3-0.7 units/ml Monitor platelets by anticoagulation protocol: Yes   Plan:  Continue heparin infusion at 1050 units/hr  Daily heparin level, CBC Monitor s/sx bleeding   Antonietta Jewel, PharmD, [place] Clinical Pharmacist  Phone: [phone] 06/19/2020 9:25 AM  Please check AMION for all [place] phone numbers After 10:00 PM, call [place] [phone]

## 2020-06-20 DIAGNOSIS — I5033 Acute on chronic diastolic (congestive) heart failure: Secondary | ICD-10-CM | POA: Diagnosis not present

## 2020-06-20 LAB — CBC
HCT: 26.2 % — ABNORMAL LOW (ref 36.0–46.0)
Hemoglobin: 7.6 g/dL — ABNORMAL LOW (ref 12.0–15.0)
MCH: 26 pg (ref 26.0–34.0)
MCHC: 29 g/dL — ABNORMAL LOW (ref 30.0–36.0)
MCV: 89.7 fL (ref 80.0–100.0)
Platelets: 193 10*3/uL (ref 150–400)
RBC: 2.92 MIL/uL — ABNORMAL LOW (ref 3.87–5.11)
RDW: 16.9 % — ABNORMAL HIGH (ref 11.5–15.5)
WBC: 5.5 10*3/uL (ref 4.0–10.5)
nRBC: 0 % (ref 0.0–0.2)

## 2020-06-20 LAB — BASIC METABOLIC PANEL
Anion gap: 10 (ref 5–15)
BUN: 15 mg/dL (ref 8–23)
CO2: 33 mmol/L — ABNORMAL HIGH (ref 22–32)
Calcium: 9.2 mg/dL (ref 8.9–10.3)
Chloride: 93 mmol/L — ABNORMAL LOW (ref 98–111)
Creatinine, Ser: 0.91 mg/dL (ref 0.44–1.00)
GFR, Estimated: >60 mL/min (ref >60)
Glucose, Bld: 81 mg/dL (ref 70–99)
Potassium: 3.9 mmol/L (ref 3.5–5.1)
Sodium: 136 mmol/L (ref 135–145)

## 2020-06-20 LAB — HEPARIN LEVEL (UNFRACTIONATED): Heparin Unfractionated: 0.19 IU/mL — ABNORMAL LOW (ref 0.30–0.70)

## 2020-06-20 LAB — MAGNESIUM: Magnesium: 1.7 mg/dL (ref 1.7–2.4)

## 2020-06-20 LAB — ANTINUCLEAR ANTIBODIES, IFA: ANA Ab, IFA: NEGATIVE

## 2020-06-20 MED ORDER — DIGOXIN 125 MCG PO TABS: 0.1250 mg | ORAL_TABLET | Freq: Every day | ORAL | 0 refills | Status: DC

## 2020-06-20 MED ORDER — FUROSEMIDE 80 MG PO TABS: 80.0000 mg | ORAL_TABLET | Freq: Two times a day (BID) | ORAL | 0 refills | Status: DC

## 2020-06-20 MED ORDER — MAGNESIUM SULFATE 2 GM/50ML IV SOLN
2.0000 g | Freq: Once | INTRAVENOUS | Status: AC
  Administered 2020-06-20: 2 g via INTRAVENOUS
  Filled 2020-06-20: qty 50

## 2020-06-20 MED ORDER — SPIRONOLACTONE 100 MG PO TABS: 200.0000 mg | ORAL_TABLET | Freq: Every day | ORAL | 0 refills | Status: DC

## 2020-06-20 NOTE — TOC Transition Note (Signed)
Transition of Care [facility]) - CM/SW Discharge Note   Patient Details  Name: Rhonda [name] MRN: [id number] Date of Birth: [birth year]  Transition of Care [facility] CM/SW Contact:  Zenon Mayo, RN Phone Number: 06/20/2020, 1:51 PM   Clinical Narrative:    Per Staff RN patient will need transport home, NCM will assist with [facility] when patient is ready.    Final next level of care: Home/Self Care Barriers to Discharge: No Barriers Identified   Patient Goals and CMS Choice        Discharge Placement                       Discharge Plan and Services                                     Social Determinants of Health (SDOH) Interventions     Readmission Risk Interventions No flowsheet data found.

## 2020-06-20 NOTE — Discharge Summary (Signed)
Name: Rhonda [name] MRN: [id number] DOB: [birth year] 65 y.o. PCP: Mardi Mainland, FNP  Date of Admission: 06/15/2020  9:39 PM Date of Discharge: 06/20/2020 Attending Physician: Lucious Groves, DO  Discharge Diagnosis: 1. AoC HFpEF (EF 67-34%) Alcoholic cirrhosis +/- possible cardiogenic cirrhosis (given severe TR regurg) Moderate to severe mitral regurgitation  2. Iron deficiency anemia Heme positive stool  3. COVID-19 positive in setting of vaccinated and boosted  4. Atrial fibrillation on Eliquis  Discharge Medications: Allergies as of 06/20/2020      Reactions   Ace Inhibitors Cough   Prednisone Itching, Swelling   Itching on leg and arms swelling from knee to ankles and felt restless      Medication List    STOP taking these medications   torsemide 20 MG tablet Commonly known as: DEMADEX     TAKE these medications   albuterol 108 (90 Base) MCG/ACT inhaler Commonly known as: VENTOLIN HFA Inhale 2 puffs into the lungs every 6 (six) hours as needed for wheezing or shortness of breath.   apixaban 5 MG Tabs tablet Commonly known as: Eliquis Take 1 tablet (5 mg total) by mouth 2 (two) times daily. Restart early afternoon on 03/14/2019 What changed: additional instructions   atorvastatin 20 MG tablet Commonly known as: LIPITOR Take 1 tablet (20 mg total) by mouth daily. What changed: when to take this   digoxin 0.125 MG tablet Commonly known as: LANOXIN Take 1 tablet (0.125 mg total) by mouth daily. Start taking on: June 21, 2020   diltiazem 360 MG 24 hr capsule Commonly known as: CARDIZEM CD Take 1 capsule (360 mg total) by mouth daily.   furosemide 80 MG tablet Commonly known as: Lasix Take 1 tablet (80 mg total) by mouth 2 (two) times daily.   levocetirizine 5 MG tablet Commonly known as: XYZAL Take 5 mg by mouth every evening.   metoprolol tartrate 100 MG tablet Commonly known as: LOPRESSOR Take 1.5 tablets (150 mg total) by mouth  2 (two) times daily. What changed:   how much to take  when to take this  additional instructions   spironolactone 100 MG tablet Commonly known as: ALDACTONE Take 2 tablets (200 mg total) by mouth daily. Start taking on: June 21, 2020 What changed:   medication strength  how much to take       Disposition and follow-up:   Rhonda [name] was discharged from [facility] in Stable condition.  At the hospital follow up visit please address:  1. Acute on Chronic HFpEF (EF 19-37%) Alcoholic cirrhosis +/- possible cardiogenic cirrhosis    2. Iron deficiency anemia Heme positive stool  3. COVID-19 positive in setting of vaccinated and boosted  4. Atrial fibrillation on Eliquis  Labs / imaging needed at time of follow-up: none  Pending labs/ test needing follow-up: CBC, BMP  Follow-up Appointments:  Follow-up Information    Mardi Mainland, FNP. Schedule an appointment as soon as possible for a visit in 1 week(s).   Specialty: Nurse Practitioner Contact information: [place] [address] [phone]        Elouise Munroe, MD. Call in 1 week(s).   Specialties: Cardiology, Radiology Contact information: [address] [place] [address] [phone]        Milus Banister, MD. Call in 1 week(s).   Specialty: Gastroenterology Contact information: [address] [place] [address] [phone]  Hospital Course by problem list: 1. AoC HFpEF (EF 99991111) Alcoholic cirrhosis +/- possible cardiogenic cirrhosis (given severe TR regurg) Moderate to severe mitral regurgitation Patient with a Hx of chronic alcoholism, with her last drink on 06/06/20, presenting with an Acute on chronic HFpEF in the setting of Alcoholic cirrhosis. GI and cardiology were consulted for her cirrhosis and HF exacerbation.   RUQ U/S showing findings consistent with cirrhosis and mild  perihepatic ascites. Other etiologies were ruled out for Rhonda [name]'s cirrhosis. She was started on 40 mg IV Lasix and 200 mg spironolactone daily with improvement of her hypervolemia.   ECHO showed an EF 50-55%, moderate MR, and severe TR. Cardiology to consider TEE and left and right heart catheterization in the outpatient setting for further evaluation of mitral regurgitation, once she is properly diuresed.   2. Iron Deficiency Anemia Heme Positive Stool Patient arrived with labs consistent with iron deficiency anemia. GI was consulted for endoscopy and management of her cirrhosis. Due to patient's acute on chronic heart failure, it was deemed that she should be seen in the outpatient when she was stable. Patient received IV Nulecit during her hospitalization. She was discharged in stable condition with follow up with GI.   3. COVID-19 Positive in Setting of Vaccinated and Boosted Patient found to have an incidental COVID + test. She did have dyspnea, but her symptoms were likely secondary to her heart failure exacerbation. Remdesivir and decadron were not initiated during this hospital stay. She was discharged in stable condition.   4. Atrial fibrillation on Eliquis Patient presents with chronic A. Fib, on Eliquis. Patient's Eliquis was held at admission and she was placed on heparin in anticipation for GI and cardiology interventions. She was deemed stable enough follow up with GI and cardiology in the outpatient setting. She was discharged back on her Eliquis, metoprolol, and digoxin.   Discharge Vitals:   BP 100/77 (BP Location: Left Arm)   Pulse 93   Temp 99.2 F (37.3 C) (Oral)   Resp 20   Ht 5\' 3"  (1.6 m)   Wt 98.9 kg   SpO2 99%   BMI 38.63 kg/m   Pertinent Labs, Studies, and Procedures:  EXAM: CHEST - 2 VIEW  COMPARISON:  06/20/2018  FINDINGS: Cardiomegaly, vascular congestion. Patchy opacity at the right lung base. Left lung clear. No effusions or acute bony  abnormality.  IMPRESSION: Cardiomegaly, vascular congestion.  Right basilar atelectasis or developing infiltrate.  EXAM: ULTRASOUND ABDOMEN LIMITED RIGHT UPPER QUADRANT  COMPARISON:  None.  FINDINGS: Gallbladder:  No gallstones are noted, with severe gallbladder wall thickening is noted measured at 10 mm. No sonographic Murphy sign noted by sonographer.  Common bile duct:  Diameter: 4 mm which is within normal limits.  Liver:  No focal lesion identified. Heterogeneous echotexture of hepatic parenchyma is noted with nodular contours suggesting hepatic cirrhosis. Portal vein is patent on color Doppler imaging with normal direction of blood flow towards the liver.  Other: Mild ascites is noted.  IMPRESSION: Findings consistent with hepatic cirrhosis. Mild ascites is noted around the liver. No definite focal sonographic hepatic abnormality is noted.  However, severe gallbladder wall thickening is noted without cholelithiasis. This may be due to surrounding ascites, but cholecystitis cannot be excluded.  BMP Latest Ref Rng & Units 06/20/2020 06/19/2020 06/18/2020  Glucose 70 - 99 mg/dL 81 82 95  BUN 8 - 23 mg/dL 15 14 17   Creatinine 0.44 - 1.00 mg/dL 0.91 0.96 1.12(H)  BUN/Creat Ratio 12 -  28 - - -  Sodium 135 - 145 mmol/L 136 136 139  Potassium 3.5 - 5.1 mmol/L 3.9 3.9 4.5  Chloride 98 - 111 mmol/L 93(L) 94(L) 97(L)  CO2 22 - 32 mmol/L 33(H) 32 34(H)  Calcium 8.9 - 10.3 mg/dL 9.2 9.1 9.1    CBC Latest Ref Rng & Units 06/20/2020 06/19/2020 06/18/2020  WBC 4.0 - 10.5 K/uL 5.5 5.7 5.3  Hemoglobin 12.0 - 15.0 g/dL 7.6(L) 8.4(L) 8.1(L)  Hematocrit 36.0 - 46.0 % 26.2(L) 29.0(L) 26.5(L)  Platelets 150 - 400 K/uL 193 190 186   Component Ref Range & Units 7 d ago  (06/18/20) 7 d ago  (06/18/20) 8 d ago  (06/17/20) 9 d ago  (06/16/20)  aPTT 24 - 36 seconds 98High  155High CM  70High CM  55High CM   Discharge Instructions: Discharge Instructions     (HEART FAILURE PATIENTS) Call MD:  Anytime you have any of the following symptoms: 1) 3 pound weight gain in 24 hours or 5 pounds in 1 week 2) shortness of breath, with or without a dry hacking cough 3) swelling in the hands, feet or stomach 4) if you have to sleep on extra pillows at night in order to breathe.   Complete by: As directed    Call MD for:  persistant dizziness or light-headedness   Complete by: As directed    Call MD for:  persistant nausea and vomiting   Complete by: As directed    Call MD for:  redness, tenderness, or signs of infection (pain, swelling, redness, odor or green/yellow discharge around incision site)   Complete by: As directed    Call MD for:  severe uncontrolled pain   Complete by: As directed    Diet - low sodium heart healthy   Complete by: As directed    Increase activity slowly   Complete by: As directed      Iron 28 - 170 ug/dL 27Low   TIBC 250 - 450 ug/dL 477High   Saturation Ratios 10.4 - 31.8 % 6Low   UIBC ug/dL 450     Ref Range & Units 9 d ago  Vitamin B-12 180 - 914 pg/mL 623     Ref Range & Units 9 d ago  Folate >5.9 ng/mL 9.6     Ref Range & Units 13 d ago  NT-Pro BNP 0 - 287 pg/mL 732High      Ref Range & Units 9 d ago  Hepatitis B Surface Ag NON REACTIVE NON REACTIVE      Ref Range & Units 9 d ago  HCV Ab NON REACTIVE NON REACTIVE     Ref Range & Units 9 d ago  Hep A IgM NON REACTIVE NON REACTIVE    Component 7 d ago  ANA Ab, IFA Negative   Comment: (NOTE)                    Negative  <1:80                    Borderline 1:80                    Positive  >1:80     Ref Range & Units 7 d ago  Mitochondrial M2 Ab, IgG 0.0 - 20.0 Units <20.0   Comment: (NOTE)                 Negative  0.0 - 20.0  Equivocal 20.1 - 24.9                 Positive     >24.9    Ref Range & Units 7 d ago   F-Actin IgG 0 - 19 Units 9   Comment: (NOTE)          Negative           0 - 19          Weak positive        20 - 30          Moderate to strong positive   >30      Ref Range & Units 7 d ago  Fecal Occult Bld NEGATIVE POSITIVEAbnormal        Signed: Maudie Mercury, MD 06/20/2020, 12:01 PM   Pager: [phone]

## 2020-06-20 NOTE — Progress Notes (Signed)
Progress Note  Patient Name: Rhonda [name] Date of Encounter: 06/20/2020  [facility] Cardiologist: Elouise Munroe, MD   Subjective   Dyspnea improving; no chest pain  Inpatient Medications    Scheduled Meds: . digoxin  0.125 mg Oral Daily  . furosemide  40 mg Intravenous BID  . metoprolol tartrate  100 mg Oral BID  . spironolactone  200 mg Oral Daily   Continuous Infusions: . heparin 1,200 Units/hr (06/20/20 0544)   PRN Meds: albuterol   Vital Signs    Vitals:   06/20/20 0004 06/20/20 0305 06/20/20 0426 06/20/20 0804  BP:  120/75  100/77  Pulse:    93  Resp:  (!) 21  20  Temp:    99.2 F (37.3 C)  TempSrc:    Oral  SpO2:    99%  Weight: 98.9 kg  98.9 kg   Height:        Intake/Output Summary (Last 24 hours) at 06/20/2020 1006 Last data filed at 06/20/2020 0600 Gross per 24 hour  Intake 477 ml  Output 4500 ml  Net -4023 ml   Last 3 Weights 06/20/2020 06/20/2020 06/18/2020  Weight (lbs) 218 lb 1.6 oz 218 lb 1.6 oz 229 lb 8 oz  Weight (kg) 98.93 kg 98.93 kg 104.101 kg      Telemetry    Atrial fibrillation rate upper normal- Personally Reviewed  Physical Exam   GEN: WD, NAD  Neck: supple, JVP difficult to assess Cardiac: irregular; 3/6 systolic murmur  Respiratory: CTA; no wheeze GI: Soft, + ascites, no masses MS: 2+ edema worse at ankles Neuro:  Grossly intact Psych: Normal affect   Labs    Chemistry Recent Labs  Lab 06/16/20 1240 06/17/20 0353 06/18/20 0333 06/19/20 0417 06/20/20 0353  NA 136 136 139 136 136  K 3.3* 3.4* 4.5 3.9 3.9  CL 93* 93* 97* 94* 93*  CO2 31 32 34* 32 33*  GLUCOSE 106* 93 95 82 81  BUN 31* 23 17 14 15   CREATININE 1.63* 1.38* 1.12* 0.96 0.91  CALCIUM 9.4 8.9 9.1 9.1 9.2  PROT 6.5 5.7*  --   --   --   ALBUMIN 3.4* 2.9*  --   --   --   AST 22 19  --   --   --   ALT 18 17  --   --   --   ALKPHOS 79 75  --   --   --   BILITOT 2.3* 1.9*  --   --   --   GFRNONAA 35* 43* 56* >60 >60  ANIONGAP 12 11 8  10 10      Hematology Recent Labs  Lab 06/18/20 0333 06/19/20 0417 06/20/20 0353  WBC 5.3 5.7 5.5  RBC 2.96* 3.17* 2.92*  HGB 8.1* 8.4* 7.6*  HCT 26.5* 29.0* 26.2*  MCV 89.5 91.5 89.7  MCH 27.4 26.5 26.0  MCHC 30.6 29.0* 29.0*  RDW 17.0* 16.7* 16.9*  PLT 186 190 193    BNP Recent Labs  Lab 06/15/20 2215  BNP 609.0*      Patient Profile     65 year old female with past medical history of chronic diastolic congestive heart failure, coronary artery disease with prior PCI, hypertension, persistent atrial fibrillation, history of heavy alcohol abuse admitted with acute on chronic diastolic congestive heart failure and possible cirrhosis.   Assessment & Plan    1 acute on chronic diastolic congestive heart failure-[id number].  However remains volume overloaded.  Continue lasix  and spironolactone at present dose. Follow renal function with diuresis.  Echocardiogram shows preserved LV function and moderate mitral regurgitation (felt to be moderate to severe on previous echocardiogram).  Volume excess is felt secondary to a combination of diastolic congestive heart failure, MR and cirrhosis.  2 cirrhosis-patient with long history of alcohol abuse and abdominal ultrasound shows cirrhosis.  INR was also elevated at time of admission.  Gastroenterology has evaluated and spironolactone has been added.  Further work-up planned as outpt.  3 permanent atrial fibrillation-continue metoprolol and digoxin for rate control.  Continue heparin.  Resume apixaban once all procedures complete.  4 mitral regurgitation-moderate on most recent transthoracic echocardiogram but may be underestimated.    We will plan right and left cardiac catheterization and TEE as an outpatient once she recovers from [place].  She also needs GI evaluation to rule out varices and to evaluate anemia prior to cardiac procedures.  Predominant issue appears to be cirrhosis.  5 chronic stage III kidney disease-renal function has  normalized with diuresis.  6 normocytic anemia-gastroenterology plans EGD and colonoscopy in several weeks after COVID infection resolved.  7 COVID-management per primary care.  For questions or updates, please contact [place] Please consult [email or web address] for contact info under        Signed, Kirk Ruths, MD  06/20/2020, 10:06 AM

## 2020-06-20 NOTE — Plan of Care (Signed)
  Problem: Clinical Measurements: Goal: Ability to maintain clinical measurements within normal limits will improve Outcome: Progressing Goal: Will remain free from infection Outcome: Progressing Goal: Diagnostic test results will improve Outcome: Progressing   Problem: Activity: Goal: Risk for activity intolerance will decrease Outcome: Progressing   Problem: Nutrition: Goal: Adequate nutrition will be maintained Outcome: Progressing   Problem: Coping: Goal: Level of anxiety will decrease Outcome: Progressing   Problem: Elimination: Goal: Will not experience complications related to bowel motility Outcome: Progressing Goal: Will not experience complications related to urinary retention Outcome: Progressing   Problem: Pain Managment: Goal: General experience of comfort will improve Outcome: Progressing   Problem: Clinical Measurements: Goal: Respiratory complications will improve Outcome: Not Progressing

## 2020-06-20 NOTE — Discharge Instructions (Signed)
To Ms. Rhonda [name],   It was a pleasure taking care of you during your hospital stay. During your stay we have been removing fluid in order to prepare you for your procedures with the cardiologist and gastroenterologist. Please take the medications that are prescribed at discharge. Please follow up with cardiology, gastroenterologist, and primary care provider.

## 2020-06-20 NOTE — Progress Notes (Addendum)
HD#4 Subjective:   No acute overnight events.  Patient seen and evaluated at bedside. States that she is doing well, abdominal distension is improving. She denies SHOB, chest pain, or diarrhea. Continue to having BMs and urinating well.   Objective:   Vital signs in last 24 hours: Vitals:   06/20/20 0000 06/20/20 0004 06/20/20 0305 06/20/20 0426  BP: 116/73  120/75   Pulse: 95     Resp: (!) 21  (!) 21   Temp:      TempSrc:      SpO2: 100%     Weight:  98.9 kg  98.9 kg  Height:       Physical Exam Constitutional:      Appearance: She is well-developed.  Cardiovascular:     Rate and Rhythm: Normal rate. Rhythm irregular.     Heart sounds: No murmur heard. No friction rub. No gallop.   Pulmonary:     Effort: Pulmonary effort is normal.     Breath sounds: Normal breath sounds. No decreased breath sounds, wheezing, rhonchi or rales.  Abdominal:     General: Bowel sounds are normal.     Palpations: Abdomen is soft.     Comments: Abdomen distended, improved from yesterday.   Neurological:     Mental Status: She is alert and oriented to person, place, and time.  Psychiatric:        Behavior: Behavior normal.      Intake/Output Summary (Last 24 hours) at 06/20/2020 0619 Last data filed at 06/19/2020 2354 Gross per 24 hour  Intake 717 ml  Output 4000 ml  Net -3283 ml    Filed Weights   06/18/20 0300 06/20/20 0004 06/20/20 0426  Weight: 104.1 kg 98.9 kg 98.9 kg    Pertinent Labs: CBC Latest Ref Rng & Units 06/20/2020 06/19/2020 06/18/2020  WBC 4.0 - 10.5 K/uL 5.5 5.7 5.3  Hemoglobin 12.0 - 15.0 g/dL 7.6(L) 8.4(L) 8.1(L)  Hematocrit 36.0 - 46.0 % 26.2(L) 29.0(L) 26.5(L)  Platelets 150 - 400 K/uL 193 190 186    CMP Latest Ref Rng & Units 06/20/2020 06/19/2020 06/18/2020  Glucose 70 - 99 mg/dL 81 82 95  BUN 8 - 23 mg/dL 15 14 17   Creatinine 0.44 - 1.00 mg/dL 0.91 0.96 1.12(H)  Sodium 135 - 145 mmol/L 136 136 139  Potassium 3.5 - 5.1 mmol/L 3.9 3.9 4.5  Chloride  98 - 111 mmol/L 93(L) 94(L) 97(L)  CO2 22 - 32 mmol/L 33(H) 32 34(H)  Calcium 8.9 - 10.3 mg/dL 9.2 9.1 9.1  Total Protein 6.5 - 8.1 g/dL - - -  Total Bilirubin 0.3 - 1.2 mg/dL - - -  Alkaline Phos 38 - 126 U/L - - -  AST 15 - 41 U/L - - -  ALT 0 - 44 U/L - - -    Imaging: No results found.  Assessment/Plan:   Active Problems:   Acute exacerbation of CHF (congestive heart failure) (HCC)   Volume overload   Alcoholic cirrhosis of liver with ascites [facility])   Patient Summary: Rhonda [name] is a 65 y.o. woman with history of HFpEF, history of heavy alcohol use, permanent atrial fibrillation on Eliquis who presented with increased work of breathing and sign of volume overload and admitted due to concern for acute exacerbation of congestive heart failure and alcoholic cirrhosis.  This is hospital day 4.  AoC HFpEF (EF 16-10%) Alcoholic cirrhosis +/- possible cardiogenic cirrhosis (given severe TR regurg) Moderate to severe mitral regurgitation Hx  of chronic alcoholism, last drink on 06/06/20. GI and cardiology following, greatly appreciate recommendations. On IV lasix 40mg  BID and spironolactone 200mg  daily. ECHO showing EF 50-55%, moderate MR, and severe TR. Cardiology to follow with a TEE and left and right heart catheterization in the outpatient setting.  - Continue lasix and spiro at current dose, but hold for SBP < 90 - Mg 1.7 this AM, will replete with 2g - Patient continued on IV heparin due to possibility of cardiac intervention once volume status improves - Anti-smooth muscles: Negative - Mitochondrial antibodies: Negative - ANA, IFA (w/ reflex): Pending - viral serologies negative  AKI - resolving Renal function significantly improved since admission. - sCr 1.63 > 1.38 > 1.12 > 0.96> 0.91 - monitor renal function  Iron deficiency anemia Heme positive stool Labs consistent with IDA. Will administer IV iron due to concomitant heart failure. GI recommending further  workup with colonoscopy and EGD In the outpatient setting in 2 weeks. -on day 3 of IV Nulecit 250mg  (last day today) -Hgb stable, 7.6  COVID-19 positive in setting of vaccinated and boosted Continuing to hold remdesivir or steroids at this time, could consider if patient begins to require increased O2 demand, as she is diuresing well.   Atrial fibrillation on Eliquis No RVR. Per cardiology, continue metoprolol but discontinue Cardizem. Added digoxin. - Holding home Eliquis; heparin per cardiology  - metoprolol 100 mg BID - digoxin 0.125 mg daily   Diet: heart healthy, carb modified IVF: None VTE: Heparin Code: Full DISCHARGE: Discharge today to follow up with cardiology and gastroenterologist.   Please contact the on call pager after 5 pm and on weekends at [phone].  Maudie Mercury, MD  PGY-2, IMTS [phone] 06/20/2020, 6:16 AM

## 2020-06-20 NOTE — Progress Notes (Signed)
[place] for heparin Indication: atrial fibrillation  Allergies  Allergen Reactions  . Ace Inhibitors Cough  . Prednisone Itching and Swelling    Itching on leg and arms swelling from knee to ankles and felt restless    Patient Measurements: Height: 5\' 3"  (160 cm) Weight: 98.9 kg (218 lb 1.6 oz) IBW/kg (Calculated) : 52.4 Heparin Dosing Weight: 77 kg  Vital Signs: Temp: 98.2 F (36.8 C) (01/14 2352) Temp Source: Oral (01/14 2352) BP: 120/75 (01/15 0305) Pulse Rate: 95 (01/15 0000)  Labs: Recent Labs    06/18/20 0333 06/18/20 1012 06/18/20 1816 06/19/20 0417 06/20/20 0353  HGB 8.1*  --   --  8.4* 7.6*  HCT 26.5*  --   --  29.0* 26.2*  PLT 186  --   --  190 193  APTT  --  155* 98*  --   --   HEPARINUNFRC 1.28*  --  0.61 0.37 0.19*  CREATININE 1.12*  --   --  0.96 0.91    Estimated Creatinine Clearance: 71.8 mL/min (by C-G formula based on SCr of 0.91 mg/dL).    Assessment: 65 y.o. female with h/o Afib, Eliquis on hold, for heparin  Goal of Therapy:  Heparin level 0.3-0.7 units/ml Monitor platelets by anticoagulation protocol: Yes   Plan: Increase Heparin  1200 units/hr Check heparin level in 8 hours.  Phillis Knack, PharmD, BCPS

## 2020-06-22 ENCOUNTER — Ambulatory Visit: Payer: Medicare HMO | Admitting: Student

## 2020-06-24 ENCOUNTER — Ambulatory Visit (INDEPENDENT_AMBULATORY_CARE_PROVIDER_SITE_OTHER): Payer: Medicare HMO | Admitting: Internal Medicine

## 2020-06-24 ENCOUNTER — Other Ambulatory Visit: Payer: Self-pay

## 2020-06-24 ENCOUNTER — Other Ambulatory Visit: Payer: Self-pay | Admitting: *Deleted

## 2020-06-24 VITALS — BP 96/54 | HR 66 | Ht 63.0 in | Wt 215.0 lb

## 2020-06-24 DIAGNOSIS — I340 Nonrheumatic mitral (valve) insufficiency: Secondary | ICD-10-CM

## 2020-06-24 DIAGNOSIS — I5041 Acute combined systolic (congestive) and diastolic (congestive) heart failure: Secondary | ICD-10-CM

## 2020-06-24 DIAGNOSIS — I4811 Longstanding persistent atrial fibrillation: Secondary | ICD-10-CM

## 2020-06-24 DIAGNOSIS — I10 Essential (primary) hypertension: Secondary | ICD-10-CM

## 2020-06-24 DIAGNOSIS — D649 Anemia, unspecified: Secondary | ICD-10-CM | POA: Diagnosis not present

## 2020-06-24 DIAGNOSIS — R600 Localized edema: Secondary | ICD-10-CM | POA: Diagnosis not present

## 2020-06-24 DIAGNOSIS — I5033 Acute on chronic diastolic (congestive) heart failure: Secondary | ICD-10-CM

## 2020-06-24 DIAGNOSIS — G4733 Obstructive sleep apnea (adult) (pediatric): Secondary | ICD-10-CM | POA: Diagnosis not present

## 2020-06-24 DIAGNOSIS — Z79899 Other long term (current) drug therapy: Secondary | ICD-10-CM

## 2020-06-24 DIAGNOSIS — R69 Illness, unspecified: Secondary | ICD-10-CM | POA: Diagnosis not present

## 2020-06-24 DIAGNOSIS — K7031 Alcoholic cirrhosis of liver with ascites: Secondary | ICD-10-CM

## 2020-06-24 NOTE — Patient Instructions (Signed)
Medication Instructions:  No Changes In Medications at this time.  *If you need a refill on your cardiac medications before your next appointment, please call your pharmacy*  Lab Work: Digoxin Level, Mag- PLEASE HAVE THIS DONE NEXT WEEK  If you have labs (blood work) drawn today and your tests are completely normal, you will receive your results only by: Marland Kitchen MyChart Message (if you have MyChart) OR . A paper copy in the mail If you have any lab test that is abnormal or we need to change your treatment, we will call you to review the results.  Follow-Up: At [facility], you and your health needs are our priority.  As part of our continuing mission to provide you with exceptional heart care, we have created designated Provider Care Teams.  These Care Teams include your primary Cardiologist (physician) and Advanced Practice Providers (APPs -  Physician Assistants and Nurse Practitioners) who all work together to provide you with the care you need, when you need it.  Your next appointment:   3 week(s)  The format for your next appointment:   In Person  Provider:   Cherlynn Kaiser, MD

## 2020-06-24 NOTE — Progress Notes (Signed)
Cardiology Office Note:    Date:  06/24/2020   ID:  Shirlee Latch, Alferd Apa 1955/12/13, MRN [id number]  PCP:  Mardi Mainland, [place]  Cardiologist:  Elouise Munroe, MD  Electrophysiologist:  None   Referring MD: Mardi Mainland   Chief Complaint/Reason for Referral: HFpEF, cirrhosis, Afib  History of Present Illness:    Rhonda [name] is a 65 y.o. female with a history of chronic diastolic congestive heart failure, coronary artery disease with prior PCI, hypertension, persistent atrial fibrillation, history of heavy alcohol abuse just discharged from hospital with acute on chronic diastolic congestive heart failure and cirrhosis.   No ETOH since Jan 1. Overall recovering from hospitalization. Still with some LE edema but improved. Tolerating digoxin but level needs to be checked.   Just discharged from hospital, overall doing ok. No CP or SOB.   Past Medical History:  Diagnosis Date  . Atrial fibrillation ([place])   . CAD (coronary artery disease) 08/2009   BMS to RCA, non-obstructive in CX and LAD 2011  . CHF (congestive heart failure) ([place])   . Edema, peripheral   . Hypertension   . Nausea   . Pneumonia    January 2020  . Sleep apnea   . Trichomoniasis 06/05/2018    Past Surgical History:  Procedure Laterality Date  . CESAREAN SECTION     x 1. no BTL  . CORONARY ANGIOPLASTY WITH STENT PLACEMENT    . DILATION AND CURETTAGE, DIAGNOSTIC / THERAPEUTIC     for SAB  . HYSTEROSCOPY WITH D & C N/A 03/13/2019   Procedure: DILATATION AND CURETTAGE /HYSTEROSCOPY;  Surgeon: Aletha Halim, MD;  Location: [facility];  Service: Gynecology;  Laterality: N/A;    Current Medications: Current Meds  Medication Sig  . albuterol (PROVENTIL HFA;VENTOLIN HFA) 108 (90 Base) MCG/ACT inhaler Inhale 2 puffs into the lungs every 6 (six) hours as needed for wheezing or shortness of breath.  Marland Kitchen apixaban (ELIQUIS) 5 MG TABS tablet Take 1 tablet (5 mg total)  by mouth 2 (two) times daily. Restart early afternoon on 03/14/2019 (Patient taking differently: Take 5 mg by mouth 2 (two) times daily.)  . atorvastatin (LIPITOR) 20 MG tablet Take 1 tablet (20 mg total) by mouth daily. (Patient taking differently: Take 20 mg by mouth at bedtime.)  . digoxin (LANOXIN) 0.125 MG tablet Take 1 tablet (0.125 mg total) by mouth daily.  Marland Kitchen diltiazem (CARDIZEM CD) 360 MG 24 hr capsule Take 1 capsule (360 mg total) by mouth daily.  . furosemide (LASIX) 80 MG tablet Take 1 tablet (80 mg total) by mouth 2 (two) times daily.  Marland Kitchen levocetirizine (XYZAL) 5 MG tablet Take 5 mg by mouth every evening.  Marland Kitchen spironolactone (ALDACTONE) 100 MG tablet Take 2 tablets (200 mg total) by mouth daily.     Allergies:   Ace inhibitors and Prednisone   Social History   Tobacco Use  . Smoking status: Former Smoker    Packs/day: 0.20    Types: Cigarettes  . Smokeless tobacco: Never Used  Vaping Use  . Vaping Use: Never used  Substance Use Topics  . Alcohol use: Yes    Alcohol/week: 5.0 standard drinks    Types: 2 Cans of beer, 3 Standard drinks or equivalent per week    Comment: "drink at least 3 shots a day"  . Drug use: Yes    Types: Marijuana    Comment: nightly     Family History: The patient's family history includes  CAD in her maternal grandmother and mother; Diabetes Mellitus II in her father; Hypertension in her sister. There is no history of Breast cancer.  ROS:   Please see the history of present illness.    All other systems reviewed and are negative.  EKGs/Labs/Other Studies Reviewed:    The following studies were reviewed today:  EKG:  afib rate 66  Recent Labs: 06/12/2020: NT-Pro BNP 732 06/15/2020: B Natriuretic Peptide 609.0 06/17/2020: ALT 17 06/20/2020: BUN 15; Creatinine, Ser 0.91; Hemoglobin 7.6; Magnesium 1.7; Platelets 193; Potassium 3.9; Sodium 136  Recent Lipid Panel No results found for: CHOL, TRIG, HDL, CHOLHDL, VLDL, LDLCALC, LDLDIRECT  Physical  Exam:    VS:  BP (!) 96/54   Pulse 66   Ht 5\' 3"  (1.6 m)   Wt 215 lb (97.5 kg)   BMI 38.09 kg/m     Wt Readings from Last 5 Encounters:  06/24/20 215 lb (97.5 kg)  06/20/20 218 lb 1.6 oz (98.9 kg)  06/09/20 225 lb (102.1 kg)  03/19/20 213 lb 9.6 oz (96.9 kg)  01/07/20 213 lb 6.4 oz (96.8 kg)    Constitutional: No acute distress Eyes: sclera non-icteric, normal conjunctiva and lids ENMT: normal dentition, moist mucous membranes Cardiovascular: regular rhythm, normal rate, 2/6 HSM. S1 and S2 normal. Radial pulses normal bilaterally. No jugular venous distention.  Respiratory: clear to auscultation bilaterally GI : normal bowel sounds, soft and nontender. No distention.   MSK: extremities warm, well perfused. No edema.  NEURO: grossly nonfocal exam, moves all extremities. PSYCH: alert and oriented x 3, normal mood and affect.   ASSESSMENT:    1. Acute on chronic diastolic CHF (congestive heart failure) ([place])   2. Lower extremity edema   3. Alcoholic cirrhosis of liver with ascites ([place])   4. Anemia, unspecified type   5. Essential hypertension   6. Medication management   7. Longstanding persistent atrial fibrillation ([place])   8. Nonrheumatic mitral valve regurgitation   9. OSA (obstructive sleep apnea)    PLAN:    Acute on chronic diastolic CHF (congestive heart failure) (HCC) - Plan: EKG 12-Lead, Digoxin level, Magnesium Lower extremity edema Cirrhosis - continue lasix and spironolactone.  -volume status improved.  Anemia, unspecified type - needs outpatient GI eval - pending.   Essential hypertension - continue current therapy, hypotensive but asymptomatic.  Medication management - needs dig monitoring  Longstanding persistent atrial fibrillation (HCC) - continue digoxin, diltiazem, and metoprolol -continue eliquis  Nonrheumatic mitral valve regurgitation -may have improved with volume management. Not urgent to address with need for GI workup.   OSA  (obstructive sleep apnea) - encourage CPAP.  Total time of encounter: 30 minutes total time of encounter, including 20 minutes spent in face-to-face patient care on the date of this encounter. This time includes coordination of care and counseling regarding above mentioned problem list. Remainder of non-face-to-face time involved reviewing chart documents/testing relevant to the patient encounter and documentation in the medical record. I have independently reviewed documentation from referring provider.   Cherlynn Kaiser, MD [place]  [facility]    Medication Adjustments/Labs and Tests Ordered: Current medicines are reviewed at length with the patient today.  Concerns regarding medicines are outlined above.   Orders Placed This Encounter  Procedures  . Digoxin level  . Magnesium  . EKG 12-Lead    No orders of the defined types were placed in this encounter.   Patient Instructions  Medication Instructions:  No Changes In Medications at this time.  *  If you need a refill on your cardiac medications before your next appointment, please call your pharmacy*  Lab Work: Digoxin Level, Mag- PLEASE HAVE THIS DONE NEXT WEEK  If you have labs (blood work) drawn today and your tests are completely normal, you will receive your results only by: Marland Kitchen MyChart Message (if you have MyChart) OR . A paper copy in the mail If you have any lab test that is abnormal or we need to change your treatment, we will call you to review the results.  Follow-Up: At [facility], you and your health needs are our priority.  As part of our continuing mission to provide you with exceptional heart care, we have created designated Provider Care Teams.  These Care Teams include your primary Cardiologist (physician) and Advanced Practice Providers (APPs -  Physician Assistants and Nurse Practitioners) who all work together to provide you with the care you need, when you need it.  Your next appointment:   3  week(s)  The format for your next appointment:   In Person  Provider:   Cherlynn Kaiser, MD

## 2020-06-24 NOTE — Patient Outreach (Signed)
[facility]) Care Management  06/24/2020  Makaia Rappa 1956-01-16 [id number]   EMMI-GENERAL DISCHARGE-RESOLVED RED ON EMMI ALERT Day #1 Date: 06/22/2020 Red Alert Reason: UNFILLED PRESCRIPTIONS  OUTREACH#1 RN spoke with pt today concerning the above emmi. Pt states this issues have been resolved as pt has all her medications filled and currently in route to her provider's office for her follow up appointment. No additional needs to address at this time.  Case closure with no additional needs.  Raina Mina, RN Care Management Coordinator [place] Office [phone]

## 2020-06-29 DIAGNOSIS — I5041 Acute combined systolic (congestive) and diastolic (congestive) heart failure: Secondary | ICD-10-CM | POA: Diagnosis not present

## 2020-06-30 LAB — MAGNESIUM: Magnesium: 1.8 mg/dL (ref 1.6–2.3)

## 2020-06-30 LAB — DIGOXIN LEVEL: Digoxin, Serum: 1.3 ng/mL — ABNORMAL HIGH (ref 0.5–0.9)

## 2020-07-01 ENCOUNTER — Ambulatory Visit (INDEPENDENT_AMBULATORY_CARE_PROVIDER_SITE_OTHER): Payer: Medicare HMO | Admitting: Physician Assistant

## 2020-07-01 ENCOUNTER — Encounter: Payer: Self-pay | Admitting: Physician Assistant

## 2020-07-01 ENCOUNTER — Other Ambulatory Visit (INDEPENDENT_AMBULATORY_CARE_PROVIDER_SITE_OTHER): Payer: Medicare HMO

## 2020-07-01 ENCOUNTER — Telehealth: Payer: Self-pay

## 2020-07-01 VITALS — BP 90/60 | HR 68 | Ht 62.5 in | Wt 218.0 lb

## 2020-07-01 DIAGNOSIS — D509 Iron deficiency anemia, unspecified: Secondary | ICD-10-CM

## 2020-07-01 DIAGNOSIS — R69 Illness, unspecified: Secondary | ICD-10-CM | POA: Diagnosis not present

## 2020-07-01 DIAGNOSIS — Z7901 Long term (current) use of anticoagulants: Secondary | ICD-10-CM | POA: Diagnosis not present

## 2020-07-01 DIAGNOSIS — K7031 Alcoholic cirrhosis of liver with ascites: Secondary | ICD-10-CM

## 2020-07-01 LAB — COMPREHENSIVE METABOLIC PANEL
ALT: 7 U/L (ref 0–35)
AST: 11 U/L (ref 0–37)
Albumin: 3.9 g/dL (ref 3.5–5.2)
Alkaline Phosphatase: 64 U/L (ref 39–117)
BUN: 23 mg/dL (ref 6–23)
CO2: 30 mEq/L (ref 19–32)
Calcium: 9.9 mg/dL (ref 8.4–10.5)
Chloride: 99 mEq/L (ref 96–112)
Creatinine, Ser: 1.49 mg/dL — ABNORMAL HIGH (ref 0.40–1.20)
GFR: 37.3 mL/min — ABNORMAL LOW (ref >60.00)
Glucose, Bld: 121 mg/dL — ABNORMAL HIGH (ref 70–99)
Potassium: 4.1 mEq/L (ref 3.5–5.1)
Sodium: 138 mEq/L (ref 135–145)
Total Bilirubin: 1.7 mg/dL — ABNORMAL HIGH (ref 0.2–1.2)
Total Protein: 6.9 g/dL (ref 6.0–8.3)

## 2020-07-01 LAB — CBC
HCT: 29.9 % — ABNORMAL LOW (ref 36.0–46.0)
Hemoglobin: 9.6 g/dL — ABNORMAL LOW (ref 12.0–15.0)
MCHC: 32.1 g/dL (ref 30.0–36.0)
MCV: 85.4 fl (ref 78.0–100.0)
Platelets: 196 10*3/uL (ref 150.0–400.0)
RBC: 3.51 Mil/uL — ABNORMAL LOW (ref 3.87–5.11)
RDW: 20.4 % — ABNORMAL HIGH (ref 11.5–15.5)
WBC: 6.9 10*3/uL (ref 4.0–10.5)

## 2020-07-01 LAB — PROTIME-INR
INR: 3 ratio — ABNORMAL HIGH (ref 0.8–1.0)
Prothrombin Time: 33.7 s — ABNORMAL HIGH (ref 9.6–13.1)

## 2020-07-01 MED ORDER — PLENVU 140 G PO SOLR: 1.0000 | ORAL | 0 refills | Status: DC

## 2020-07-01 NOTE — Telephone Encounter (Signed)
Request for surgical clearance:     Endoscopy Procedure  What type of surgery is being performed?  Colon/Endo   When is this surgery scheduled?     08/05/20  What type of clearance is required ?   Pharmacy/ Cardiac Clearance   Are there any medications that need to be held prior to surgery and how long? Eliquis x2 days prior to procedure. Please also advise if patient is cleared by cardiology to proceed with Colon/Endo.   Practice name and name of physician performing surgery?      [facility]-Dr. Ardis Hughs  What is your office phone and fax number?      Phone- [phone]  [phone]  Anesthesia type (None, local, MAC, general) ?       MAC

## 2020-07-01 NOTE — Patient Instructions (Signed)
You have been scheduled for an endoscopy and colonoscopy. Please follow the written instructions given to you at your visit today. Please pick up your prep supplies at the pharmacy within the next 1-3 days. If you use inhalers (even only as needed), please bring them with you on the day of your procedure.  We have sent the following medications to your pharmacy for you to pick up at your convenience: Plenvu  Your provider has requested that you go to the basement level for lab work before leaving today. Press "B" on the elevator. The lab is located at the first door on the left as you exit the elevator.  You will be contaced by our office prior to your procedure for directions on holding your Eliquis.  If you do not hear from our office 1 week prior to your scheduled procedure, please call [phone] to discuss.  If you are age 69 or younger, your body mass index should be between 19-25. Your Body mass index is 39.24 kg/m. If this is out of the aformentioned range listed, please consider follow up with your Primary Care Provider.   Thank you for choosing me and [facility].  Rhonda [name]

## 2020-07-01 NOTE — Progress Notes (Signed)
Chief Complaint: Anemia  HPI:    [name] is a 65 year old female with a past medical history of stage III CKD, CAD on Eliquis (06/17/2020 echo with LVEF 50-55%), CHF and others listed below, who was referred to me by Rhonda [name],* for a complaint of anemia.      Patient admitted to the hospital 06/15/2020-06/20/2020 for iron deficiency anemia with heme positive stool as well as alcoholic cirrhosis plus/minus possible cardiogenic cirrhosis and COVID-19.  That time discussion had a history of chronic alcoholism and presented with an acute on chronic HFpEF in the setting of alcoholic cirrhosis.  Right upper quadrant ultrasound showed findings consistent with cirrhosis and mild perihepatic ascites.  She is started on Lasix and spironolactone.  In the hospital we recommended the patient be seen in out patient setting when she was stable from her heart failure standpoint for procedures.    We were consulted 06/17/2020 for new diagnosis of cirrhosis of the liver and IDA.  Hep A, B, and C serologies were negative, no thrombocytopenia, elevated INR in the setting of chronic Eliquis.  It was discussed that patient cirrhosis was likely from chronic alcoholism and she reported drinking 1/5 of vodka daily for many years, had decreased a bit lately.  She was started on Aldactone 200 mg daily.  Recommended an EGD and colonoscopy as an outpatient.  There was severe tricuspid GERD rotation on echo which raised the possibility of cardiogenic component to her cirrhosis.  ANA, mitochondrial antibodies, smooth muscle antibodies were all pending (have since resulted normal).  For her anemia received ferric gluconate infusion 1/13.  She was discharged on Lasix 80 mg and Aldactone 200 daily.    06/16/2020 iron studies with an iron low at 27, TIBC elevated at 477 and percent saturation 6 with a ferritin of 12.  Folate 9.6.    06/19/2020 Dr. Ardis [name] recommended she have a CBC, c-Met and INR.  These were never completed.     06/20/2020 CBC with a hemoglobin of 7.6 (8.4 on 1/14, 8.1 on 1/13, 9.2 on 1/4).    Today, the patient presents to clinic and tells me that she was able to lose a lot of "fluid weight" after she left the hospital but does weigh herself regularly and over the past couple of days she has gained about 3 pounds.  Also describes some shortness of breath when up and moving around.  Does tell me her leg edema is seeming to get better, but remains worse in the left than the right.  She continues Lasix 80 mg twice a day and Aldactone 200 mg daily.  Denies abdominal pain.    Patient has followed with cardiology since being seen here and they have increased her metoprolol.  Patient tells me that due to this she has been having a decrease in blood pressure, she is going to call and let them know.    Patient is completely stopped drinking alcohol, in fact she had started her abstinence prior to hospital admission and has not started again.    Denies fever, chills, heartburn or reflux.     Past Medical History:  Diagnosis Date  . Atrial fibrillation ([place])   . CAD (coronary artery disease) 08/2009   BMS to RCA, non-obstructive in CX and LAD 2011  . CHF (congestive heart failure) ([place])   . Edema, peripheral   . Hypertension   . Nausea   . Pneumonia    January 2020  . Sleep apnea   .  Trichomoniasis 06/05/2018    Past Surgical History:  Procedure Laterality Date  . CESAREAN SECTION     x 1. no BTL  . CORONARY ANGIOPLASTY WITH STENT PLACEMENT    . DILATION AND CURETTAGE, DIAGNOSTIC / THERAPEUTIC     for SAB  . HYSTEROSCOPY WITH D & C N/A 03/13/2019   Procedure: DILATATION AND CURETTAGE /HYSTEROSCOPY;  Surgeon: Rhonda Halim, MD;  Location: [place];  Service: Gynecology;  Laterality: N/A;    Current Outpatient Medications  Medication Sig Dispense Refill  . albuterol (PROVENTIL HFA;VENTOLIN HFA) 108 (90 Base) MCG/ACT inhaler Inhale 2 puffs into the lungs every 6 (six) hours as needed  for wheezing or shortness of breath. 1 Inhaler 2  . apixaban (ELIQUIS) 5 MG TABS tablet Take 1 tablet (5 mg total) by mouth 2 (two) times daily. Restart early afternoon on 03/14/2019 (Patient taking differently: Take 5 mg by mouth 2 (two) times daily.) 180 tablet 1  . atorvastatin (LIPITOR) 20 MG tablet Take 1 tablet (20 mg total) by mouth daily. (Patient taking differently: Take 20 mg by mouth at bedtime.) 90 tablet 3  . digoxin (LANOXIN) 0.125 MG tablet Take 1 tablet (0.125 mg total) by mouth daily. 30 tablet 0  . diltiazem (CARDIZEM CD) 360 MG 24 hr capsule Take 1 capsule (360 mg total) by mouth daily. 90 capsule 3  . furosemide (LASIX) 80 MG tablet Take 1 tablet (80 mg total) by mouth 2 (two) times daily. 60 tablet 0  . levocetirizine (XYZAL) 5 MG tablet Take 5 mg by mouth every evening.    . metoprolol tartrate (LOPRESSOR) 100 MG tablet Take 1.5 tablets (150 mg total) by mouth 2 (two) times daily. (Patient taking differently: Take 100-150 mg by mouth See admin instructions. 174m in the am 1037mat bedtime.) 135 tablet 3  . spironolactone (ALDACTONE) 100 MG tablet Take 2 tablets (200 mg total) by mouth daily. 60 tablet 0   No current facility-administered medications for this visit.    Allergies as of 07/01/2020 - Review Complete 06/24/2020  Allergen Reaction Noted  . Ace inhibitors Cough 02/04/2016  . Prednisone Itching and Swelling 07/03/2018    Family History  Problem Relation Age of Onset  . CAD Mother   . Diabetes Mellitus II Father   . Hypertension Sister   . CAD Maternal Grandmother   . Breast cancer Neg Hx     Social History   Socioeconomic History  . Marital status: Divorced    Spouse name: Not on file  . Number of children: Not on file  . Years of education: Not on file  . Highest education level: Not on file  Occupational History  . Not on file  Tobacco Use  . Smoking status: Former Smoker    Packs/day: 0.20    Types: Cigarettes  . Smokeless tobacco: Never  Used  Vaping Use  . Vaping Use: Never used  Substance and Sexual Activity  . Alcohol use: Yes    Alcohol/week: 5.0 standard drinks    Types: 2 Cans of beer, 3 Standard drinks or equivalent per week    Comment: "drink at least 3 shots a day"  . Drug use: Yes    Types: Marijuana    Comment: nightly  . Sexual activity: Yes    Birth control/protection: Post-menopausal  Other Topics Concern  . Not on file  Social History Narrative  . Not on file   Social Determinants of Health   Financial Resource Strain: Not on file  Food Insecurity: Not on file  Transportation Needs: Not on file  Physical Activity: Not on file  Stress: Not on file  Social Connections: Not on file  Intimate Partner Violence: Not on file    Review of Systems:    Constitutional: No weight loss, fever or chills Cardiovascular: No chest pain  Respiratory: No cough Gastrointestinal: See HPI and otherwise negative   Physical Exam:  Vital signs: BP 90/60 (BP Location: Left Arm, Patient Position: Sitting, Cuff Size: Normal)   Pulse 68 Comment: irregular  Ht 5' 2.5" (1.588 m) Comment: height measured without shoes  Wt 218 lb (98.9 kg)   BMI 39.24 kg/m   Constitutional:   Pleasant obese Caucasian female appears to be in NAD, Well developed, Well nourished, alert and cooperative Head:  Normocephalic and atraumatic. Eyes:   PEERL, EOMI. No icterus. Conjunctiva pink. Ears:  Normal auditory acuity. Neck:  Supple Throat: Oral cavity and pharynx without inflammation, swelling or lesion.  Respiratory: Respirations even and unlabored. Lungs clear to auscultation bilaterally.   No wheezes, crackles, or rhonchi.  Cardiovascular: Normal S1, S2. No MRG. Regular rate and rhythm. 3+ pitting edema to the level of the knee- left leg worse than right Gastrointestinal:  Soft, nondistended, nontender. No rebound or guarding. Normal bowel sounds. No appreciable masses or hepatomegaly. Rectal:  Not performed.  Psychiatric:  Demonstrates good judgement and reason without abnormal affect or behaviors.  RELEVANT LABS AND IMAGING: CBC    Component Value Date/Time   WBC 5.5 06/20/2020 0353   RBC 2.92 (L) 06/20/2020 0353   HGB 7.6 (L) 06/20/2020 0353   HGB 9.2 (L) 06/09/2020 1630   HCT 26.2 (L) 06/20/2020 0353   HCT 29.2 (L) 06/09/2020 1630   PLT 193 06/20/2020 0353   PLT 209 06/09/2020 1630   MCV 89.7 06/20/2020 0353   MCV 86 06/09/2020 1630   MCH 26.0 06/20/2020 0353   MCHC 29.0 (L) 06/20/2020 0353   RDW 16.9 (H) 06/20/2020 0353   RDW 14.6 06/09/2020 1630   LYMPHSABS 1.5 06/20/2018 1554   LYMPHSABS 2.3 03/29/2018 1023   MONOABS 0.8 06/20/2018 1554   EOSABS 0.0 06/20/2018 1554   EOSABS 0.1 03/29/2018 1023   BASOSABS 0.0 06/20/2018 1554   BASOSABS 0.0 03/29/2018 1023    CMP     Component Value Date/Time   NA 136 06/20/2020 0353   NA 138 06/12/2020 1454   K 3.9 06/20/2020 0353   CL 93 (L) 06/20/2020 0353   CO2 33 (H) 06/20/2020 0353   GLUCOSE 81 06/20/2020 0353   BUN 15 06/20/2020 0353   BUN 29 (H) 06/12/2020 1454   CREATININE 0.91 06/20/2020 0353   CALCIUM 9.2 06/20/2020 0353   PROT 5.7 (L) 06/17/2020 0353   PROT 6.5 06/09/2020 1630   ALBUMIN 2.9 (L) 06/17/2020 0353   ALBUMIN 3.8 06/09/2020 1630   AST 19 06/17/2020 0353   ALT 17 06/17/2020 0353   ALKPHOS 75 06/17/2020 0353   BILITOT 1.9 (H) 06/17/2020 0353   BILITOT 2.7 (H) 06/09/2020 1630   GFRNONAA >60 06/20/2020 0353   GFRAA 38 (L) 06/12/2020 1454    Assessment: 1.  Alcoholic cirrhosis with ascites: Recent admission to the hospital as above with acute on chronic heart failure, trouble with fluid on high-dose diuretics there was some question about a cardiac component to her cirrhosis 2.  IDA: Discovered at time of admission; consider GI source versus other 3.  CAD on chronic anticoagulation: With Eliquis  Plan: 1.  Repeat labs today  including CBC, CMP and PT/INR 2.  Patient was scheduled for an EGD and colonoscopy at Dr. Ardis [name]  next available which is March 2.  Did provide the patient with a detailed list of risk for the procedures and she agrees to proceed.  Will discuss timing with Dr. Ardis [name], if he would like this sooner we will try and schedule her with another provider. 3.  Patient was advised to hold her Eliquis for 2 days prior to time of procedures.  We will communicate with her cardiologist to get clearance for her Eliquis as well as cardiac clearance given recent heart failure issues. 4.  Continue Lasix 80 mg twice daily and Spironolactone 100 mg twice daily. 5.  Patient to follow in clinic per recommendations from Dr. Ardis [name] after time of procedures.  Ellouise Newer, PA-C [facility] 07/01/2020, 11:31 AM  Cc: Rhonda [name],*

## 2020-07-01 NOTE — Telephone Encounter (Signed)
   Primary Cardiologist: Elouise Munroe, MD  Chart reviewed as part of pre-operative protocol coverage.   Patient with diagnosis of afib on Eliquis for anticoagulation.    Procedure: colonoscopy/endoscopy Date of procedure: 08/05/20  CHA2DS2-VASc Score = 4  This indicates a 4.8% annual risk of stroke. The patient's score is based upon: CHF History: Yes HTN History: Yes Diabetes History: No Stroke History: No Vascular Disease History: Yes Age Score: 0 Gender Score: 1   CrCl 117mL/min Platelet count 193K  Per office protocol, patient can hold Eliquis for 2 days prior to procedure.  I will route this recommendation to the requesting party via Epic fax function and remove from pre-op pool.  Please call with questions.  Jossie Ng. Lailana Shira NP-C    07/01/2020, 2:38 PM [facility] [place] [address] Office [phone] Fax [phone]

## 2020-07-01 NOTE — Telephone Encounter (Signed)
Patient with diagnosis of afib on Eliquis for anticoagulation.    Procedure: colonoscopy/endoscopy Date of procedure: 08/05/20  CHA2DS2-VASc Score = 4  This indicates a 4.8% annual risk of stroke. The patient's score is based upon: CHF History: Yes HTN History: Yes Diabetes History: No Stroke History: No Vascular Disease History: Yes Age Score: 0 Gender Score: 1   CrCl 14mL/min Platelet count 193K  Per office protocol, patient can hold Eliquis for 2 days prior to procedure.

## 2020-07-03 ENCOUNTER — Other Ambulatory Visit: Payer: Self-pay

## 2020-07-03 ENCOUNTER — Other Ambulatory Visit (HOSPITAL_COMMUNITY): Payer: Medicare HMO

## 2020-07-03 DIAGNOSIS — K7031 Alcoholic cirrhosis of liver with ascites: Secondary | ICD-10-CM

## 2020-07-03 DIAGNOSIS — Z79899 Other long term (current) drug therapy: Secondary | ICD-10-CM

## 2020-07-03 DIAGNOSIS — D509 Iron deficiency anemia, unspecified: Secondary | ICD-10-CM

## 2020-07-03 NOTE — Progress Notes (Signed)
I agree with the above note, plan 

## 2020-07-10 DIAGNOSIS — Z79899 Other long term (current) drug therapy: Secondary | ICD-10-CM | POA: Diagnosis not present

## 2020-07-11 LAB — DIGOXIN LEVEL: Digoxin, Serum: 1 ng/mL — ABNORMAL HIGH (ref 0.5–0.9)

## 2020-07-15 ENCOUNTER — Other Ambulatory Visit: Payer: Self-pay

## 2020-07-15 DIAGNOSIS — Z79899 Other long term (current) drug therapy: Secondary | ICD-10-CM

## 2020-07-16 ENCOUNTER — Ambulatory Visit: Payer: Medicare HMO | Admitting: Internal Medicine

## 2020-07-16 ENCOUNTER — Other Ambulatory Visit: Payer: Medicare HMO

## 2020-07-16 ENCOUNTER — Encounter: Payer: Self-pay | Admitting: Internal Medicine

## 2020-07-16 ENCOUNTER — Other Ambulatory Visit: Payer: Self-pay

## 2020-07-16 VITALS — BP 102/66 | HR 67 | Ht 62.5 in | Wt 205.2 lb

## 2020-07-16 DIAGNOSIS — G4733 Obstructive sleep apnea (adult) (pediatric): Secondary | ICD-10-CM

## 2020-07-16 DIAGNOSIS — I340 Nonrheumatic mitral (valve) insufficiency: Secondary | ICD-10-CM | POA: Diagnosis not present

## 2020-07-16 DIAGNOSIS — I5032 Chronic diastolic (congestive) heart failure: Secondary | ICD-10-CM

## 2020-07-16 DIAGNOSIS — I10 Essential (primary) hypertension: Secondary | ICD-10-CM | POA: Diagnosis not present

## 2020-07-16 DIAGNOSIS — D649 Anemia, unspecified: Secondary | ICD-10-CM

## 2020-07-16 DIAGNOSIS — D509 Iron deficiency anemia, unspecified: Secondary | ICD-10-CM

## 2020-07-16 DIAGNOSIS — D6869 Other thrombophilia: Secondary | ICD-10-CM | POA: Diagnosis not present

## 2020-07-16 DIAGNOSIS — R69 Illness, unspecified: Secondary | ICD-10-CM | POA: Diagnosis not present

## 2020-07-16 DIAGNOSIS — Z79899 Other long term (current) drug therapy: Secondary | ICD-10-CM

## 2020-07-16 DIAGNOSIS — I2510 Atherosclerotic heart disease of native coronary artery without angina pectoris: Secondary | ICD-10-CM

## 2020-07-16 DIAGNOSIS — R600 Localized edema: Secondary | ICD-10-CM | POA: Diagnosis not present

## 2020-07-16 DIAGNOSIS — I4811 Longstanding persistent atrial fibrillation: Secondary | ICD-10-CM | POA: Diagnosis not present

## 2020-07-16 DIAGNOSIS — K7031 Alcoholic cirrhosis of liver with ascites: Secondary | ICD-10-CM

## 2020-07-16 MED ORDER — SPIRONOLACTONE 100 MG PO TABS: 200.0000 mg | ORAL_TABLET | Freq: Every day | ORAL | 3 refills | Status: DC

## 2020-07-16 MED ORDER — DILTIAZEM HCL ER COATED BEADS 360 MG PO CP24: 360.0000 mg | ORAL_CAPSULE | Freq: Every day | ORAL | 3 refills | Status: DC

## 2020-07-16 MED ORDER — DIGOXIN 62.5 MCG PO TABS: 0.0625 mg | ORAL_TABLET | Freq: Every day | ORAL | 3 refills | Status: DC

## 2020-07-16 MED ORDER — ATORVASTATIN CALCIUM 20 MG PO TABS: 20.0000 mg | ORAL_TABLET | Freq: Every day | ORAL | 3 refills | Status: DC

## 2020-07-16 MED ORDER — FUROSEMIDE 80 MG PO TABS
80.0000 mg | ORAL_TABLET | Freq: Two times a day (BID) | ORAL | 3 refills | Status: DC
Start: 2020-07-16 — End: 2020-09-30

## 2020-07-16 MED ORDER — APIXABAN 5 MG PO TABS: 5.0000 mg | ORAL_TABLET | Freq: Two times a day (BID) | ORAL | 3 refills | Status: DC

## 2020-07-16 MED ORDER — METOPROLOL TARTRATE 100 MG PO TABS: 150.0000 mg | ORAL_TABLET | Freq: Two times a day (BID) | ORAL | 3 refills | Status: DC

## 2020-07-16 NOTE — Patient Instructions (Addendum)
Medication Instructions:  Your physician recommends that you continue on your current medications as directed. Please refer to the Current Medication list given to you today.  *If you need a refill on your cardiac medications before your next appointment, please call your pharmacy*   Lab Work: Your physician recommends that you return for lab work in: digoxin level  If you have labs (blood work) drawn today and your tests are completely normal, you will receive your results only by: Marland Kitchen MyChart Message (if you have MyChart) OR . A paper copy in the mail If you have any lab test that is abnormal or we need to change your treatment, we will call you to review the results.   Follow-Up: At [facility], you and your health needs are our priority.  As part of our continuing mission to provide you with exceptional heart care, we have created designated Provider Care Teams.  These Care Teams include your primary Cardiologist (physician) and Advanced Practice Providers (APPs -  Physician Assistants and Nurse Practitioners) who all work together to provide you with the care you need, when you need it.  We recommend signing up for the patient portal called "MyChart".  Sign up information is provided on this After Visit Summary.  MyChart is used to connect with patients for Virtual Visits (Telemedicine).  Patients are able to view lab/test results, encounter notes, upcoming appointments, etc.  Non-urgent messages can be sent to your provider as well.   To learn more about what you can do with MyChart, go to [email or web address].    Your next appointment:   2-3 month(s)  The format for your next appointment:   In Person  Provider:   Cherlynn Kaiser, MD   Other Instructions -Please give our office a call if the leg redness/pain has not resolved in 2-3 days.

## 2020-07-16 NOTE — Addendum Note (Signed)
Addended by: Jacob Moores on: 07/16/2020 01:59 PM   Modules accepted: Orders

## 2020-07-16 NOTE — H&P (View-Only) (Signed)
Cardiology Office Note:    Date:  07/16/2020   ID:  [name] 06-Feb-1956, MRN [id number]  PCP:  Mardi Mainland, [place]  Cardiologist:  Elouise Munroe, MD  Electrophysiologist:  None   Referring MD: Mardi Mainland   Chief Complaint/Reason for Referral: HFpEF, afib, CAD, cirrhosis  History of Present Illness:    Rhonda [name] is a 65 y.o. female with a history of chronic diastolic congestive heart failure, coronary artery disease with prior PCI, hypertension, persistent atrial fibrillation, history of heavy alcohol abuse recently hospitalized in January '22 with acute on chronic diastolic congestive heart failure and cirrhosis.   She is doing much better. She is awaiting EGD/Colo with GI on 08/05/20 for anemia and cirrhosis.   She feels her weight has decreased 30 lbs. Objectively weights have at least decreased 20 lbs. This is likely fluid weight and she is no longer consuming alcohol and is watching her diet closely.   HF and hepatic regimen includes lasix and spironolactone.   Afib rates responded well to digoxin, and she continues on diltiazem, metoprolol and eliquis for anticoagulation.   She had a fall on the ice last week and has a red, puff R lower extremity anteriorly. Erythematous and tender, no skin wound seen. Trauma most likely cause, DVT vs cellulitis less likely but possible. She will monitor for clinical improvement, if no improvement we can perform LE doppler on R leg. Consider call to PCP for possible cellulitis.  Past Medical History:  Diagnosis Date  . Atrial fibrillation ([place])   . CAD (coronary artery disease) 08/2009   BMS to RCA, non-obstructive in CX and LAD 2011  . CHF (congestive heart failure) ([place])   . Edema, peripheral   . Hypertension   . Nausea   . Pneumonia    January 2020  . Sleep apnea   . Trichomoniasis 06/05/2018    Past Surgical History:  Procedure Laterality Date  . CESAREAN SECTION     x 1. no  BTL  . CORONARY ANGIOPLASTY WITH STENT PLACEMENT    . DILATION AND CURETTAGE, DIAGNOSTIC / THERAPEUTIC     for SAB  . HYSTEROSCOPY WITH D & C N/A 03/13/2019   Procedure: DILATATION AND CURETTAGE /HYSTEROSCOPY;  Surgeon: Aletha Halim, MD;  Location: [place];  Service: Gynecology;  Laterality: N/A;    Current Medications: Current Meds  Medication Sig  . albuterol (PROVENTIL HFA;VENTOLIN HFA) 108 (90 Base) MCG/ACT inhaler Inhale 2 puffs into the lungs every 6 (six) hours as needed for wheezing or shortness of breath.  . levocetirizine (XYZAL) 5 MG tablet Take 5 mg by mouth every evening.  Marland Kitchen PEG-KCl-NaCl-NaSulf-Na Asc-C (PLENVU) 140 g SOLR Take 1 kit by mouth as directed. Use coupon: BIN: 245809 PNC: CNRX Group: [id number] ID: [id number]  . [DISCONTINUED] apixaban (ELIQUIS) 5 MG TABS tablet Take 1 tablet (5 mg total) by mouth 2 (two) times daily. Restart early afternoon on 03/14/2019  . [DISCONTINUED] atorvastatin (LIPITOR) 20 MG tablet Take 1 tablet (20 mg total) by mouth daily.  . [DISCONTINUED] digoxin (LANOXIN) 0.125 MG tablet Take 1 tablet (0.125 mg total) by mouth daily.  . [DISCONTINUED] diltiazem (CARDIZEM CD) 360 MG 24 hr capsule Take 1 capsule (360 mg total) by mouth daily.  . [DISCONTINUED] furosemide (LASIX) 80 MG tablet Take 1 tablet (80 mg total) by mouth 2 (two) times daily.  . [DISCONTINUED] metoprolol tartrate (LOPRESSOR) 100 MG tablet Take 1.5 tablets (150 mg total) by mouth 2 (two)  times daily. (Patient taking differently: Take 150 mg by mouth 2 (two) times daily. Take 1.5 in morning and 1 at night.)  . [DISCONTINUED] spironolactone (ALDACTONE) 100 MG tablet Take 2 tablets (200 mg total) by mouth daily.     Allergies:   Ace inhibitors and Prednisone   Social History   Tobacco Use  . Smoking status: Former Smoker    Packs/day: 0.20    Types: Cigarettes  . Smokeless tobacco: Never Used  Vaping Use  . Vaping Use: Never used  Substance Use Topics  .  Alcohol use: Yes    Alcohol/week: 5.0 standard drinks    Types: 2 Cans of beer, 3 Standard drinks or equivalent per week    Comment: "drink at least 3 shots a day"  . Drug use: Yes    Types: Marijuana    Comment: nightly     Family History: The patient's family history includes CAD in her maternal grandmother and mother; Diabetes Mellitus II in her father; Hypertension in her sister. There is no history of Breast cancer.  ROS:   Please see the history of present illness.    All other systems reviewed and are negative.  EKGs/Labs/Other Studies Reviewed:    The following studies were reviewed today:  EKG:  Afib, nonspecific ST T wave abnl.   Recent Labs: 06/12/2020: NT-Pro BNP 732 06/15/2020: B Natriuretic Peptide 609.0 06/29/2020: Magnesium 1.8 07/01/2020: ALT 7; BUN 23; Creatinine, Ser 1.49; Hemoglobin 9.6; Platelets 196.0; Potassium 4.1; Sodium 138  Recent Lipid Panel No results found for: CHOL, TRIG, HDL, CHOLHDL, VLDL, LDLCALC, LDLDIRECT  Physical Exam:    VS:  BP 102/66   Pulse 67   Ht 5' 2.5" (1.588 m)   Wt 205 lb 3.2 oz (93.1 kg)   BMI 36.93 kg/m     Wt Readings from Last 5 Encounters:  07/16/20 205 lb 3.2 oz (93.1 kg)  07/01/20 218 lb (98.9 kg)  06/24/20 215 lb (97.5 kg)  06/20/20 218 lb 1.6 oz (98.9 kg)  06/09/20 225 lb (102.1 kg)    Constitutional: No acute distress Eyes: sclera non-icteric, normal conjunctiva and lids ENMT: normal dentition, moist mucous membranes Cardiovascular: irregular rhythm, normal rate, 2/6 HSM. S1 and S2 normal. Radial pulses normal bilaterally. No jugular venous distention.  Respiratory: clear to auscultation bilaterally GI : normal bowel sounds, soft and nontender. No distention.   MSK: extremities warm, well perfused. 1 + dorsal foot edema.  NEURO: grossly nonfocal exam, moves all extremities. PSYCH: alert and oriented x 3, normal mood and affect.   ASSESSMENT:    1. Longstanding persistent atrial fibrillation ([place])   2.  Secondary hypercoagulable state ([place])   3. Medication management   4. Chronic diastolic HF (heart failure) ([place])   5. Ascites due to alcoholic cirrhosis ([place])   6. Anemia, unspecified type   7. Lower extremity edema   8. Essential hypertension   9. Nonrheumatic mitral valve regurgitation   10. OSA (obstructive sleep apnea)   11. Coronary artery disease involving native coronary artery of native heart without angina pectoris    PLAN:    AF - longstanding persistent.  - continue digoxin, needs level drawn today. Will obtain with her other labs for GI.  - continue diltiazem, metoprolol, and eliquis.   Chronic diastolic HF - class I-II symptoms. Currently stable. - continue lasix and spironolactone, continue metoprolol.   Cirrhosis -ascites improved.  - continue spironolactone. - may need to transition BB after EGD, will await recommendations from GI -  last drink Jun 06, 2020.   HTN - low BP with medications therapy. Monitor.  Anemia - awaiting GI workup.  - ok to hold Eliquis for 2 days prior to procedure with CHADS2VASC of 4. Resume as soon as safe from bleeding perspective.  MR - will reassess when GI situation is fully evaluated. No current clinical indication for TEE.   CAD - on eliquis, statin, BB. No CP. Stable.    Total time of encounter: 30 minutes total time of encounter, including 25 minutes spent in face-to-face patient care on the date of this encounter. This time includes coordination of care and counseling regarding above mentioned problem list. Remainder of non-face-to-face time involved reviewing chart documents/testing relevant to the patient encounter and documentation in the medical record. I have independently reviewed documentation from referring provider.   Cherlynn Kaiser, MD [place]  [facility]    Medication Adjustments/Labs and Tests Ordered: Current medicines are reviewed at length with the patient today.  Concerns regarding medicines are  outlined above.   Orders Placed This Encounter  Procedures  . Digoxin level  . EKG 12-Lead     Meds ordered this encounter  Medications  . apixaban (ELIQUIS) 5 MG TABS tablet    Sig: Take 1 tablet (5 mg total) by mouth 2 (two) times daily. Restart early afternoon on 03/14/2019    Dispense:  180 tablet    Refill:  3  . atorvastatin (LIPITOR) 20 MG tablet    Sig: Take 1 tablet (20 mg total) by mouth daily.    Dispense:  90 tablet    Refill:  3  . digoxin 62.5 MCG TABS    Sig: Take 0.0625 mg by mouth daily.    Dispense:  90 tablet    Refill:  3  . diltiazem (CARDIZEM CD) 360 MG 24 hr capsule    Sig: Take 1 capsule (360 mg total) by mouth daily.    Dispense:  90 capsule    Refill:  3  . furosemide (LASIX) 80 MG tablet    Sig: Take 1 tablet (80 mg total) by mouth 2 (two) times daily.    Dispense:  200 tablet    Refill:  3  . metoprolol tartrate (LOPRESSOR) 100 MG tablet    Sig: Take 1.5 tablets (150 mg total) by mouth 2 (two) times daily. Take 1.5 in morning and 1 at night.    Dispense:  275 tablet    Refill:  3  . spironolactone (ALDACTONE) 100 MG tablet    Sig: Take 2 tablets (200 mg total) by mouth daily.    Dispense:  180 tablet    Refill:  3    Patient Instructions  Medication Instructions:  Your physician recommends that you continue on your current medications as directed. Please refer to the Current Medication list given to you today.  *If you need a refill on your cardiac medications before your next appointment, please call your pharmacy*   Lab Work: Your physician recommends that you return for lab work in: digoxin level  If you have labs (blood work) drawn today and your tests are completely normal, you will receive your results only by: Marland Kitchen MyChart Message (if you have MyChart) OR . A paper copy in the mail If you have any lab test that is abnormal or we need to change your treatment, we will call you to review the results.   Follow-Up: At [facility], you and your health needs are our priority.  As  part of our continuing mission to provide you with exceptional heart care, we have created designated Provider Care Teams.  These Care Teams include your primary Cardiologist (physician) and Advanced Practice Providers (APPs -  Physician Assistants and Nurse Practitioners) who all work together to provide you with the care you need, when you need it.  We recommend signing up for the patient portal called "MyChart".  Sign up information is provided on this After Visit Summary.  MyChart is used to connect with patients for Virtual Visits (Telemedicine).  Patients are able to view lab/test results, encounter notes, upcoming appointments, etc.  Non-urgent messages can be sent to your provider as well.   To learn more about what you can do with MyChart, go to [email or web address].    Your next appointment:   2-3 month(s)  The format for your next appointment:   In Person  Provider:   Cherlynn Kaiser, MD   Other Instructions -Please give our office a call if the leg redness/pain has not resolved in 2-3 days.

## 2020-07-16 NOTE — Progress Notes (Signed)
Cardiology Office Note:    Date:  07/16/2020   ID:  Rhonda [name] April 19, 1956, MRN [id number]  PCP:  Mardi Mainland, [place]  Cardiologist:  Elouise Munroe, MD  Electrophysiologist:  None   Referring MD: Mardi Mainland   Chief Complaint/Reason for Referral: HFpEF, afib, CAD, cirrhosis  History of Present Illness:    Rhonda [name] is a 65 y.o. female with a history of chronic diastolic congestive heart failure, coronary artery disease with prior PCI, hypertension, persistent atrial fibrillation, history of heavy alcohol abuse recently hospitalized in January '22 with acute on chronic diastolic congestive heart failure and cirrhosis.   She is doing much better. She is awaiting EGD/Colo with GI on 08/05/20 for anemia and cirrhosis.   She feels her weight has decreased 30 lbs. Objectively weights have at least decreased 20 lbs. This is likely fluid weight and she is no longer consuming alcohol and is watching her diet closely.   HF and hepatic regimen includes lasix and spironolactone.   Afib rates responded well to digoxin, and she continues on diltiazem, metoprolol and eliquis for anticoagulation.   She had a fall on the ice last week and has a red, puff R lower extremity anteriorly. Erythematous and tender, no skin wound seen. Trauma most likely cause, DVT vs cellulitis less likely but possible. She will monitor for clinical improvement, if no improvement we can perform LE doppler on R leg. Consider call to PCP for possible cellulitis.  Past Medical History:  Diagnosis Date  . Atrial fibrillation ([place])   . CAD (coronary artery disease) 08/2009   BMS to RCA, non-obstructive in CX and LAD 2011  . CHF (congestive heart failure) ([place])   . Edema, peripheral   . Hypertension   . Nausea   . Pneumonia    January 2020  . Sleep apnea   . Trichomoniasis 06/05/2018    Past Surgical History:  Procedure Laterality Date  . CESAREAN SECTION     x 1. no  BTL  . CORONARY ANGIOPLASTY WITH STENT PLACEMENT    . DILATION AND CURETTAGE, DIAGNOSTIC / THERAPEUTIC     for SAB  . HYSTEROSCOPY WITH D & C N/A 03/13/2019   Procedure: DILATATION AND CURETTAGE /HYSTEROSCOPY;  Surgeon: Aletha Halim, MD;  Location: [place];  Service: Gynecology;  Laterality: N/A;    Current Medications: Current Meds  Medication Sig  . albuterol (PROVENTIL HFA;VENTOLIN HFA) 108 (90 Base) MCG/ACT inhaler Inhale 2 puffs into the lungs every 6 (six) hours as needed for wheezing or shortness of breath.  . levocetirizine (XYZAL) 5 MG tablet Take 5 mg by mouth every evening.  Marland Kitchen PEG-KCl-NaCl-NaSulf-Na Asc-C (PLENVU) 140 g SOLR Take 1 kit by mouth as directed. Use coupon: BIN: 300762 PNC: CNRX Group: [id number] ID: [id number]  . [DISCONTINUED] apixaban (ELIQUIS) 5 MG TABS tablet Take 1 tablet (5 mg total) by mouth 2 (two) times daily. Restart early afternoon on 03/14/2019  . [DISCONTINUED] atorvastatin (LIPITOR) 20 MG tablet Take 1 tablet (20 mg total) by mouth daily.  . [DISCONTINUED] digoxin (LANOXIN) 0.125 MG tablet Take 1 tablet (0.125 mg total) by mouth daily.  . [DISCONTINUED] diltiazem (CARDIZEM CD) 360 MG 24 hr capsule Take 1 capsule (360 mg total) by mouth daily.  . [DISCONTINUED] furosemide (LASIX) 80 MG tablet Take 1 tablet (80 mg total) by mouth 2 (two) times daily.  . [DISCONTINUED] metoprolol tartrate (LOPRESSOR) 100 MG tablet Take 1.5 tablets (150 mg total) by mouth 2 (two)  times daily. (Patient taking differently: Take 150 mg by mouth 2 (two) times daily. Take 1.5 in morning and 1 at night.)  . [DISCONTINUED] spironolactone (ALDACTONE) 100 MG tablet Take 2 tablets (200 mg total) by mouth daily.     Allergies:   Ace inhibitors and Prednisone   Social History   Tobacco Use  . Smoking status: Former Smoker    Packs/day: 0.20    Types: Cigarettes  . Smokeless tobacco: Never Used  Vaping Use  . Vaping Use: Never used  Substance Use Topics  .  Alcohol use: Yes    Alcohol/week: 5.0 standard drinks    Types: 2 Cans of beer, 3 Standard drinks or equivalent per week    Comment: "drink at least 3 shots a day"  . Drug use: Yes    Types: Marijuana    Comment: nightly     Family History: The patient's family history includes CAD in her maternal grandmother and mother; Diabetes Mellitus II in her father; Hypertension in her sister. There is no history of Breast cancer.  ROS:   Please see the history of present illness.    All other systems reviewed and are negative.  EKGs/Labs/Other Studies Reviewed:    The following studies were reviewed today:  EKG:  Afib, nonspecific ST T wave abnl.   Recent Labs: 06/12/2020: NT-Pro BNP 732 06/15/2020: B Natriuretic Peptide 609.0 06/29/2020: Magnesium 1.8 07/01/2020: ALT 7; BUN 23; Creatinine, Ser 1.49; Hemoglobin 9.6; Platelets 196.0; Potassium 4.1; Sodium 138  Recent Lipid Panel No results found for: CHOL, TRIG, HDL, CHOLHDL, VLDL, LDLCALC, LDLDIRECT  Physical Exam:    VS:  BP 102/66   Pulse 67   Ht 5' 2.5" (1.588 m)   Wt 205 lb 3.2 oz (93.1 kg)   BMI 36.93 kg/m     Wt Readings from Last 5 Encounters:  07/16/20 205 lb 3.2 oz (93.1 kg)  07/01/20 218 lb (98.9 kg)  06/24/20 215 lb (97.5 kg)  06/20/20 218 lb 1.6 oz (98.9 kg)  06/09/20 225 lb (102.1 kg)    Constitutional: No acute distress Eyes: sclera non-icteric, normal conjunctiva and lids ENMT: normal dentition, moist mucous membranes Cardiovascular: irregular rhythm, normal rate, 2/6 HSM. S1 and S2 normal. Radial pulses normal bilaterally. No jugular venous distention.  Respiratory: clear to auscultation bilaterally GI : normal bowel sounds, soft and nontender. No distention.   MSK: extremities warm, well perfused. 1 + dorsal foot edema.  NEURO: grossly nonfocal exam, moves all extremities. PSYCH: alert and oriented x 3, normal mood and affect.   ASSESSMENT:    1. Longstanding persistent atrial fibrillation ([place])   2.  Secondary hypercoagulable state ([place])   3. Medication management   4. Chronic diastolic HF (heart failure) ([place])   5. Ascites due to alcoholic cirrhosis ([place])   6. Anemia, unspecified type   7. Lower extremity edema   8. Essential hypertension   9. Nonrheumatic mitral valve regurgitation   10. OSA (obstructive sleep apnea)   11. Coronary artery disease involving native coronary artery of native heart without angina pectoris    PLAN:    AF - longstanding persistent.  - continue digoxin, needs level drawn today. Will obtain with her other labs for GI.  - continue diltiazem, metoprolol, and eliquis.   Chronic diastolic HF - class I-II symptoms. Currently stable. - continue lasix and spironolactone, continue metoprolol.   Cirrhosis -ascites improved.  - continue spironolactone. - may need to transition BB after EGD, will await recommendations from GI -  last drink Jun 06, 2020.   HTN - low BP with medications therapy. Monitor.  Anemia - awaiting GI workup.  - ok to hold Eliquis for 2 days prior to procedure with CHADS2VASC of 4. Resume as soon as safe from bleeding perspective.  MR - will reassess when GI situation is fully evaluated. No current clinical indication for TEE.   CAD - on eliquis, statin, BB. No CP. Stable.    Total time of encounter: 30 minutes total time of encounter, including 25 minutes spent in face-to-face patient care on the date of this encounter. This time includes coordination of care and counseling regarding above mentioned problem list. Remainder of non-face-to-face time involved reviewing chart documents/testing relevant to the patient encounter and documentation in the medical record. I have independently reviewed documentation from referring provider.   Cherlynn Kaiser, MD [place]  [facility]    Medication Adjustments/Labs and Tests Ordered: Current medicines are reviewed at length with the patient today.  Concerns regarding medicines are  outlined above.   Orders Placed This Encounter  Procedures  . Digoxin level  . EKG 12-Lead     Meds ordered this encounter  Medications  . apixaban (ELIQUIS) 5 MG TABS tablet    Sig: Take 1 tablet (5 mg total) by mouth 2 (two) times daily. Restart early afternoon on 03/14/2019    Dispense:  180 tablet    Refill:  3  . atorvastatin (LIPITOR) 20 MG tablet    Sig: Take 1 tablet (20 mg total) by mouth daily.    Dispense:  90 tablet    Refill:  3  . digoxin 62.5 MCG TABS    Sig: Take 0.0625 mg by mouth daily.    Dispense:  90 tablet    Refill:  3  . diltiazem (CARDIZEM CD) 360 MG 24 hr capsule    Sig: Take 1 capsule (360 mg total) by mouth daily.    Dispense:  90 capsule    Refill:  3  . furosemide (LASIX) 80 MG tablet    Sig: Take 1 tablet (80 mg total) by mouth 2 (two) times daily.    Dispense:  200 tablet    Refill:  3  . metoprolol tartrate (LOPRESSOR) 100 MG tablet    Sig: Take 1.5 tablets (150 mg total) by mouth 2 (two) times daily. Take 1.5 in morning and 1 at night.    Dispense:  275 tablet    Refill:  3  . spironolactone (ALDACTONE) 100 MG tablet    Sig: Take 2 tablets (200 mg total) by mouth daily.    Dispense:  180 tablet    Refill:  3    Patient Instructions  Medication Instructions:  Your physician recommends that you continue on your current medications as directed. Please refer to the Current Medication list given to you today.  *If you need a refill on your cardiac medications before your next appointment, please call your pharmacy*   Lab Work: Your physician recommends that you return for lab work in: digoxin level  If you have labs (blood work) drawn today and your tests are completely normal, you will receive your results only by: Marland Kitchen MyChart Message (if you have MyChart) OR . A paper copy in the mail If you have any lab test that is abnormal or we need to change your treatment, we will call you to review the results.   Follow-Up: At [facility], you and your health needs are our priority.  As  part of our continuing mission to provide you with exceptional heart care, we have created designated Provider Care Teams.  These Care Teams include your primary Cardiologist (physician) and Advanced Practice Providers (APPs -  Physician Assistants and Nurse Practitioners) who all work together to provide you with the care you need, when you need it.  We recommend signing up for the patient portal called "MyChart".  Sign up information is provided on this After Visit Summary.  MyChart is used to connect with patients for Virtual Visits (Telemedicine).  Patients are able to view lab/test results, encounter notes, upcoming appointments, etc.  Non-urgent messages can be sent to your provider as well.   To learn more about what you can do with MyChart, go to [email or web address].    Your next appointment:   2-3 month(s)  The format for your next appointment:   In Person  Provider:   Cherlynn Kaiser, MD   Other Instructions -Please give our office a call if the leg redness/pain has not resolved in 2-3 days.

## 2020-07-16 NOTE — Telephone Encounter (Signed)
Pt called and states she receievd message

## 2020-07-16 NOTE — Telephone Encounter (Signed)
Called and left detailed message- Okay to hold Eliquis x2 days prior to procedure. I have ask patient to return call to confirm that she did receive our message.

## 2020-07-17 DIAGNOSIS — R69 Illness, unspecified: Secondary | ICD-10-CM | POA: Diagnosis not present

## 2020-07-17 DIAGNOSIS — D509 Iron deficiency anemia, unspecified: Secondary | ICD-10-CM | POA: Diagnosis not present

## 2020-07-17 DIAGNOSIS — Z79899 Other long term (current) drug therapy: Secondary | ICD-10-CM | POA: Diagnosis not present

## 2020-07-18 LAB — PROTIME-INR
INR: 1.3 — ABNORMAL HIGH (ref 0.9–1.2)
Prothrombin Time: 13 s — ABNORMAL HIGH (ref 9.1–12.0)

## 2020-07-18 LAB — BASIC METABOLIC PANEL
BUN/Creatinine Ratio: 13 (ref 12–28)
BUN: 18 mg/dL (ref 8–27)
CO2: 24 mmol/L (ref 20–29)
Calcium: 10 mg/dL (ref 8.7–10.3)
Chloride: 93 mmol/L — ABNORMAL LOW (ref 96–106)
Creatinine, Ser: 1.42 mg/dL — ABNORMAL HIGH (ref 0.57–1.00)
GFR calc Af Amer: 46 mL/min/{1.73_m2} — ABNORMAL LOW (ref >59)
GFR calc non Af Amer: 40 mL/min/{1.73_m2} — ABNORMAL LOW (ref >59)
Glucose: 106 mg/dL — ABNORMAL HIGH (ref 65–99)
Potassium: 4.8 mmol/L (ref 3.5–5.2)
Sodium: 136 mmol/L (ref 134–144)

## 2020-07-18 LAB — CBC WITH DIFFERENTIAL/PLATELET
Basophils Absolute: 0 10*3/uL (ref 0.0–0.2)
Basos: 0 %
EOS (ABSOLUTE): 0.1 10*3/uL (ref 0.0–0.4)
Eos: 1 %
Hematocrit: 30.9 % — ABNORMAL LOW (ref 34.0–46.6)
Hemoglobin: 9.8 g/dL — ABNORMAL LOW (ref 11.1–15.9)
Immature Grans (Abs): 0 10*3/uL (ref 0.0–0.1)
Immature Granulocytes: 0 %
Lymphocytes Absolute: 1.4 10*3/uL (ref 0.7–3.1)
Lymphs: 19 %
MCH: 27.3 pg (ref 26.6–33.0)
MCHC: 31.7 g/dL (ref 31.5–35.7)
MCV: 86 fL (ref 79–97)
Monocytes Absolute: 0.8 10*3/uL (ref 0.1–0.9)
Monocytes: 12 %
Neutrophils Absolute: 4.9 10*3/uL (ref 1.4–7.0)
Neutrophils: 68 %
Platelets: 227 10*3/uL (ref 150–450)
RBC: 3.59 x10E6/uL — ABNORMAL LOW (ref 3.77–5.28)
RDW: 16.4 % — ABNORMAL HIGH (ref 11.7–15.4)
WBC: 7.3 10*3/uL (ref 3.4–10.8)

## 2020-07-18 LAB — DIGOXIN LEVEL: Digoxin, Serum: 1 ng/mL — ABNORMAL HIGH (ref 0.5–0.9)

## 2020-07-20 ENCOUNTER — Other Ambulatory Visit: Payer: Self-pay

## 2020-07-20 ENCOUNTER — Telehealth: Payer: Self-pay

## 2020-07-20 DIAGNOSIS — K7031 Alcoholic cirrhosis of liver with ascites: Secondary | ICD-10-CM

## 2020-07-20 DIAGNOSIS — D509 Iron deficiency anemia, unspecified: Secondary | ICD-10-CM

## 2020-07-20 NOTE — Telephone Encounter (Signed)
Called pt back and left message informing pt that her medication digoxin was sent to her pharmacy as requested on 07/16/20 with a year supply, dispensing 90 tablets with 3 refills. I advised pt that if she has any other problems, questions or concerns, to give our office a call back.

## 2020-07-23 ENCOUNTER — Other Ambulatory Visit: Payer: Self-pay

## 2020-07-23 DIAGNOSIS — Z79899 Other long term (current) drug therapy: Secondary | ICD-10-CM

## 2020-07-30 NOTE — Progress Notes (Signed)
Attempted to obtain medical history via telephone, unable to reach at this time. I left a voicemail to return pre surgical testing department's phone call.  

## 2020-07-31 ENCOUNTER — Other Ambulatory Visit: Payer: Self-pay

## 2020-08-03 ENCOUNTER — Other Ambulatory Visit (HOSPITAL_COMMUNITY): Payer: Medicare HMO

## 2020-08-04 ENCOUNTER — Telehealth: Payer: Self-pay | Admitting: Internal Medicine

## 2020-08-04 MED ORDER — DIGOXIN 125 MCG PO TABS: 0.0625 mg | ORAL_TABLET | Freq: Every day | ORAL | 3 refills | Status: DC

## 2020-08-04 NOTE — Telephone Encounter (Signed)
Pt c/o medication issue:  1. Name of Medication: digoxin (LANOXIN) 0.125 MG tablet  2. How are you currently taking this medication (dosage and times per day)? As written  3. Are you having a reaction (difficulty breathing--STAT)? No  4. What is your medication issue? Pharmacist wants medication to be cut in half so that the insurance can pay for it

## 2020-08-04 NOTE — Telephone Encounter (Signed)
Spoke with Marjory Lies at [facility]. Prescription needed to be changed to be covered by insurance. Digoxin updated and other forms removed from medication list.

## 2020-08-05 ENCOUNTER — Encounter: Payer: Medicare HMO | Admitting: Gastroenterology

## 2020-08-06 ENCOUNTER — Telehealth: Payer: Self-pay

## 2020-08-06 ENCOUNTER — Encounter (HOSPITAL_COMMUNITY): Payer: Self-pay | Admitting: Gastroenterology

## 2020-08-06 ENCOUNTER — Other Ambulatory Visit: Payer: Self-pay

## 2020-08-06 ENCOUNTER — Ambulatory Visit (HOSPITAL_COMMUNITY)
Admission: RE | Admit: 2020-08-06 | Discharge: 2020-08-06 | Disposition: A | Discharge status: Home / Self Care | Payer: Medicare HMO | Attending: Gastroenterology | Admitting: Gastroenterology

## 2020-08-06 ENCOUNTER — Ambulatory Visit (HOSPITAL_COMMUNITY): Payer: Medicare HMO | Admitting: Anesthesiology

## 2020-08-06 ENCOUNTER — Encounter (HOSPITAL_COMMUNITY)
Admission: RE | Disposition: A | Discharge status: Home / Self Care | Payer: Self-pay | Source: Home / Self Care | Attending: Gastroenterology

## 2020-08-06 DIAGNOSIS — K7460 Unspecified cirrhosis of liver: Secondary | ICD-10-CM | POA: Insufficient documentation

## 2020-08-06 DIAGNOSIS — D509 Iron deficiency anemia, unspecified: Secondary | ICD-10-CM | POA: Insufficient documentation

## 2020-08-06 DIAGNOSIS — R69 Illness, unspecified: Secondary | ICD-10-CM | POA: Diagnosis not present

## 2020-08-06 DIAGNOSIS — Z7951 Long term (current) use of inhaled steroids: Secondary | ICD-10-CM | POA: Insufficient documentation

## 2020-08-06 DIAGNOSIS — Z87891 Personal history of nicotine dependence: Secondary | ICD-10-CM | POA: Diagnosis not present

## 2020-08-06 DIAGNOSIS — I5032 Chronic diastolic (congestive) heart failure: Secondary | ICD-10-CM | POA: Insufficient documentation

## 2020-08-06 DIAGNOSIS — Z79899 Other long term (current) drug therapy: Secondary | ICD-10-CM | POA: Diagnosis not present

## 2020-08-06 DIAGNOSIS — I8500 Esophageal varices without bleeding: Secondary | ICD-10-CM | POA: Insufficient documentation

## 2020-08-06 DIAGNOSIS — K5710 Diverticulosis of small intestine without perforation or abscess without bleeding: Secondary | ICD-10-CM | POA: Diagnosis not present

## 2020-08-06 DIAGNOSIS — I4811 Longstanding persistent atrial fibrillation: Secondary | ICD-10-CM | POA: Insufficient documentation

## 2020-08-06 DIAGNOSIS — K766 Portal hypertension: Secondary | ICD-10-CM | POA: Insufficient documentation

## 2020-08-06 DIAGNOSIS — I8510 Secondary esophageal varices without bleeding: Secondary | ICD-10-CM | POA: Diagnosis not present

## 2020-08-06 DIAGNOSIS — D122 Benign neoplasm of ascending colon: Secondary | ICD-10-CM | POA: Insufficient documentation

## 2020-08-06 DIAGNOSIS — D126 Benign neoplasm of colon, unspecified: Secondary | ICD-10-CM | POA: Diagnosis not present

## 2020-08-06 DIAGNOSIS — Z8249 Family history of ischemic heart disease and other diseases of the circulatory system: Secondary | ICD-10-CM | POA: Diagnosis not present

## 2020-08-06 DIAGNOSIS — Z888 Allergy status to other drugs, medicaments and biological substances status: Secondary | ICD-10-CM | POA: Insufficient documentation

## 2020-08-06 DIAGNOSIS — K3189 Other diseases of stomach and duodenum: Secondary | ICD-10-CM | POA: Diagnosis not present

## 2020-08-06 DIAGNOSIS — D123 Benign neoplasm of transverse colon: Secondary | ICD-10-CM | POA: Diagnosis not present

## 2020-08-06 DIAGNOSIS — Z833 Family history of diabetes mellitus: Secondary | ICD-10-CM | POA: Diagnosis not present

## 2020-08-06 DIAGNOSIS — I110 Hypertensive heart disease with heart failure: Secondary | ICD-10-CM | POA: Insufficient documentation

## 2020-08-06 DIAGNOSIS — Z955 Presence of coronary angioplasty implant and graft: Secondary | ICD-10-CM | POA: Diagnosis not present

## 2020-08-06 DIAGNOSIS — D6869 Other thrombophilia: Secondary | ICD-10-CM | POA: Insufficient documentation

## 2020-08-06 DIAGNOSIS — K635 Polyp of colon: Secondary | ICD-10-CM

## 2020-08-06 DIAGNOSIS — R188 Other ascites: Secondary | ICD-10-CM | POA: Insufficient documentation

## 2020-08-06 DIAGNOSIS — Z7901 Long term (current) use of anticoagulants: Secondary | ICD-10-CM | POA: Diagnosis not present

## 2020-08-06 DIAGNOSIS — K7031 Alcoholic cirrhosis of liver with ascites: Secondary | ICD-10-CM

## 2020-08-06 DIAGNOSIS — K5730 Diverticulosis of large intestine without perforation or abscess without bleeding: Secondary | ICD-10-CM | POA: Insufficient documentation

## 2020-08-06 HISTORY — PX: COLONOSCOPY WITH PROPOFOL: SHX5780

## 2020-08-06 HISTORY — PX: ESOPHAGOGASTRODUODENOSCOPY (EGD) WITH PROPOFOL: SHX5813

## 2020-08-06 HISTORY — PX: POLYPECTOMY: SHX5525

## 2020-08-06 HISTORY — PX: BIOPSY: SHX5522

## 2020-08-06 SURGERY — COLONOSCOPY WITH PROPOFOL
Anesthesia: Monitor Anesthesia Care

## 2020-08-06 MED ORDER — PROPOFOL 10 MG/ML IV BOLUS
INTRAVENOUS | Status: AC
  Filled 2020-08-06: qty 20

## 2020-08-06 MED ORDER — PROPOFOL 500 MG/50ML IV EMUL
INTRAVENOUS | Status: DC | PRN
  Administered 2020-08-06: 30 mg via INTRAVENOUS

## 2020-08-06 MED ORDER — PROPOFOL 500 MG/50ML IV EMUL
INTRAVENOUS | Status: DC | PRN
  Administered 2020-08-06: 150 ug/kg/min via INTRAVENOUS

## 2020-08-06 MED ORDER — LACTATED RINGERS IV SOLN: INTRAVENOUS | Status: DC | PRN

## 2020-08-06 MED ORDER — SODIUM CHLORIDE 0.9 % IV SOLN: INTRAVENOUS | Status: DC

## 2020-08-06 MED ORDER — PHENYLEPHRINE HCL (PRESSORS) 10 MG/ML IV SOLN
INTRAVENOUS | Status: DC | PRN
  Administered 2020-08-06: 80 ug via INTRAVENOUS

## 2020-08-06 SURGICAL SUPPLY — 24 items

## 2020-08-06 NOTE — Anesthesia Preprocedure Evaluation (Addendum)
Anesthesia Evaluation  Patient identified by MRN, date of birth, ID band Patient awake    Reviewed: Allergy & Precautions, NPO status , Patient's Chart, lab work & pertinent test results, reviewed documented beta blocker date and time   Airway Mallampati: II  TM Distance: >3 FB Neck ROM: Full    Dental  (+) Missing, Dental Advisory Given, Poor Dentition, Chipped,    Pulmonary sleep apnea and Continuous Positive Airway Pressure Ventilation , former smoker,    Pulmonary exam normal breath sounds clear to auscultation       Cardiovascular hypertension, Pt. on home beta blockers and Pt. on medications + CAD, + Cardiac Stents (2011 BMS to RCA) and +CHF  + dysrhythmias (eliquis) Atrial Fibrillation + Valvular Problems/Murmurs (mod MR, severe TR) MR  Rhythm:Irregular Rate:Normal  Echo 06/2020: 1. Left ventricular ejection fraction, by estimation, is 50 to 55%. The left ventricle has low normal function. The left ventricle has no regional wall motion abnormalities. There is mild left ventricular hypertrophy.  2. Right ventricular systolic function is moderately reduced. The right ventricular size is moderately enlarged. There is moderately elevated  pulmonary artery systolic pressure.  3. Left atrial size was severely dilated.  4. Right atrial size was severely dilated.  5. Eccentric posteriorly directed MR likely related to restricted posterior leaflet motion and atrial enlargement . The mitral valve is abnormal. Moderate mitral valve regurgitation. No evidence of mitral  stenosis.  6. Tricuspid valve regurgitation is severe.  7. The aortic valve is normal in structure. Aortic valve regurgitation is  not visualized. Mild aortic valve sclerosis is present, with no evidence  of aortic valve stenosis.  8. The inferior vena cava is dilated in size with <50% respiratory  variability, suggesting right atrial pressure of 15 mmHg.     Neuro/Psych negative neurological ROS  negative psych ROS   GI/Hepatic (+) Cirrhosis     substance abuse (5 drinks/wk)  alcohol use and marijuana use,   Endo/Other  Obesity BMI 35  Renal/GU Renal InsufficiencyRenal diseaseCr 1.42  negative genitourinary   Musculoskeletal negative musculoskeletal ROS (+)   Abdominal (+) + obese,   Peds  Hematology negative hematology ROS (+)   Anesthesia Other Findings   Reproductive/Obstetrics negative OB ROS                            Anesthesia Physical Anesthesia Plan  ASA: IV  Anesthesia Plan: MAC   Post-op Pain Management:    Induction:   PONV Risk Score and Plan: 2 and Propofol infusion and TIVA  Airway Management Planned: Natural Airway and Simple Face Mask  Additional Equipment: None  Intra-op Plan:   Post-operative Plan:   Informed Consent: I have reviewed the patients History and Physical, chart, labs and discussed the procedure including the risks, benefits and alternatives for the proposed anesthesia with the patient or authorized representative who has indicated his/her understanding and acceptance.       Plan Discussed with: CRNA  Anesthesia Plan Comments:        Anesthesia Quick Evaluation

## 2020-08-06 NOTE — Discharge Instructions (Signed)
YOU HAD AN ENDOSCOPIC PROCEDURE TODAY: Refer to the procedure report and other information in the discharge instructions given to you for any specific questions about what was found during the examination. If this information does not answer your questions, please call [facility] office at [phone] to clarify.  ° °YOU SHOULD EXPECT: Some feelings of bloating in the abdomen. Passage of more gas than usual. Walking can help get rid of the air that was put into your GI tract during the procedure and reduce the bloating. If you had a lower endoscopy (such as a colonoscopy or flexible sigmoidoscopy) you may notice spotting of blood in your stool or on the toilet paper. Some abdominal soreness may be present for a day or two, also. ° °DIET: Your first meal following the procedure should be a light meal and then it is ok to progress to your normal diet. A half-sandwich or bowl of soup is an example of a good first meal. Heavy or fried foods are harder to digest and may make you feel nauseous or bloated. Drink plenty of fluids but you should avoid alcoholic beverages for 24 hours. If you had a esophageal dilation, please see attached instructions for diet.   ° °ACTIVITY: Your care partner should take you home directly after the procedure. You should plan to take it easy, moving slowly for the rest of the day. You can resume normal activity the day after the procedure however YOU SHOULD NOT DRIVE, use power tools, machinery or perform tasks that involve climbing or major physical exertion for 24 hours (because of the sedation medicines used during the test).  ° °SYMPTOMS TO REPORT IMMEDIATELY: °A gastroenterologist can be reached at any hour. Please call [phone]  for any of the following symptoms:  °Following lower endoscopy (colonoscopy, flexible sigmoidoscopy) °Excessive amounts of blood in the stool  °Significant tenderness, worsening of abdominal pains  °Swelling of the abdomen that is new, acute  °Fever of 100° or  higher  °Following upper endoscopy (EGD, EUS, ERCP, esophageal dilation) °Vomiting of blood or coffee ground material  °New, significant abdominal pain  °New, significant chest pain or pain under the shoulder blades  °Painful or persistently difficult swallowing  °New shortness of breath  °Black, tarry-looking or red, bloody stools ° °FOLLOW UP:  °If any biopsies were taken you will be contacted by phone or by letter within the next 1-3 weeks. Call [phone]  if you have not heard about the biopsies in 3 weeks.  °Please also call with any specific questions about appointments or follow up tests. ° °

## 2020-08-06 NOTE — Telephone Encounter (Signed)
The pt has been scheduled to see Dr Ardis Hughs on 09/30/20 at 150 pm. Labs ordered to be completed a few days prior to the appt.  Letter mailed to the pt with information.

## 2020-08-06 NOTE — Anesthesia Postprocedure Evaluation (Signed)
Anesthesia Post Note  Patient: Rhonda [name]  Procedure(s) Performed: COLONOSCOPY WITH PROPOFOL (N/A ) ESOPHAGOGASTRODUODENOSCOPY (EGD) WITH PROPOFOL (N/A ) POLYPECTOMY BIOPSY     Patient location during evaluation: PACU Anesthesia Type: MAC Level of consciousness: awake and alert Pain management: pain level controlled Vital Signs Assessment: post-procedure vital signs reviewed and stable Respiratory status: spontaneous breathing, nonlabored ventilation and respiratory function stable Cardiovascular status: blood pressure returned to baseline and stable Postop Assessment: no apparent nausea or vomiting Anesthetic complications: no   No complications documented.  Last Vitals:  Vitals:   08/06/20 1150 08/06/20 1200  BP: 115/65 111/63  Pulse: (!) 58 (!) 40  Resp: 20 16  Temp:    SpO2: 98% 90%    Last Pain:  Vitals:   08/06/20 1200  TempSrc:   PainSc: 0-No pain                 Pervis Hocking

## 2020-08-06 NOTE — Telephone Encounter (Signed)
-----   Message from Milus Banister, MD sent at 08/06/2020 11:42 AM EST ----- Hey, She needs OV with me in 5-6 weeks, labs a few days prior (cbc, cmet, inr).  Thanks

## 2020-08-06 NOTE — Op Note (Signed)
[facility] Patient Name: Rhonda [name] Procedure Date: 08/06/2020 MRN: [id number] Attending MD: Milus Banister , MD Date of Birth: April 25, 1956 CSN: [id number] Age: 65 Admit Type: Outpatient Procedure:                Upper GI endoscopy Indications:              Iron deficiency anemia, recently diagnosed cirrhosis Providers:                Milus Banister, MD, Cleda Daub, RN, Benetta Spar, Technician Referring MD:              Medicines:                Monitored Anesthesia Care Complications:            No immediate complications. Estimated blood loss:                            None. Estimated Blood Loss:     Estimated blood loss: none. Procedure:                Pre-Anesthesia Assessment:                           - Prior to the procedure, a History and Physical                            was performed, and patient medications and                            allergies were reviewed. The patient's tolerance of                            previous anesthesia was also reviewed. The risks                            and benefits of the procedure and the sedation                            options and risks were discussed with the patient.                            All questions were answered, and informed consent                            was obtained. Prior Anticoagulants: The patient has                            taken Eliquis (apixaban), last dose was 2 days                            prior to procedure. ASA Grade Assessment: IV - A  patient with severe systemic disease that is a                            constant threat to life. After reviewing the risks                            and benefits, the patient was deemed in                            satisfactory condition to undergo the procedure.                           After obtaining informed consent, the endoscope was                             passed under direct vision. Throughout the                            procedure, the patient's blood pressure, pulse, and                            oxygen saturations were monitored continuously. The                            [id number]-H190 ([id number]) Olympus gastroscope was                            introduced through the mouth, and advanced to the                            second part of duodenum. The upper GI endoscopy was                            accomplished without difficulty. The patient                            tolerated the procedure well. Scope In: Scope Out: Findings:      Small distal esophagus varices without signs of recent bleeding.      Changes of mild portal gastropathy.      Unusual duodenum mucosa, ? scalloped. Biopsies for histology were taken       with a cold forceps in the duodenal bulb and in the first portion of the       duodenum for evaluation of celiac disease.      The exam was otherwise without abnormality. No gastric varices. Impression:               - Small distal esophagus varices without signs of                            recent bleeding.                           - Changes of mild portal gastropathy.                           -  Unusual duodenum mucosa, ? scalloped. Biopsies                            for histology were taken with a cold forceps in the                            duodenal bulb and in the first portion of the                            duodenum for evaluation of celiac disease. Moderate Sedation:      Not Applicable - Patient had care per Anesthesia. Recommendation:           - Patient has a contact number available for                            emergencies. The signs and symptoms of potential                            delayed complications were discussed with the                            patient. Return to normal activities tomorrow.                            Written discharge instructions were provided to the                             patient.                           - Resume previous diet.                           - Continue present medications. OK to resume your                            bloodthinner tomorrow.                           - Await pathology results.                           - Dr. Ardis Hughs' office will arrange followup visit in                            4-6 weeks with labs checked a few days prior (cbc,                            cmet, INR). Procedure Code(s):        --- Professional ---                           [phone], Esophagogastroduodenoscopy, flexible,  transoral; with biopsy, single or multiple Diagnosis Code(s):        --- Professional ---                           D50.9, Iron deficiency anemia, unspecified CPT copyright 2019 American Medical Association. All rights reserved. The codes documented in this report are preliminary and upon coder review may  be revised to meet current compliance requirements. Milus Banister, MD 08/06/2020 11:36:59 AM This report has been signed electronically. Number of Addenda: 0

## 2020-08-06 NOTE — Interval H&P Note (Signed)
History and Physical Interval Note:  08/06/2020 10:16 AM  Rhonda [name]  has presented today for surgery, with the diagnosis of cirrhoisis- IDA.  The various methods of treatment have been discussed with the patient and family. After consideration of risks, benefits and other options for treatment, the patient has consented to  Procedure(s): COLONOSCOPY WITH PROPOFOL (N/A) ESOPHAGOGASTRODUODENOSCOPY (EGD) WITH PROPOFOL (N/A) as a surgical intervention.  The patient's history has been reviewed, patient examined, no change in status, stable for surgery.  I have reviewed the patient's chart and labs.  Questions were answered to the patient's satisfaction.     Milus Banister

## 2020-08-06 NOTE — Op Note (Signed)
[facility] Patient Name: Rhonda [name] Procedure Date: 08/06/2020 MRN: [id number] Attending MD: Milus Banister , MD Date of Birth: 13-Apr-1956 CSN: [id number] Age: 65 Admit Type: Inpatient Procedure:                Colonoscopy Indications:              Iron deficiency anemia Providers:                Milus Banister, MD, Cleda Daub, RN, Benetta Spar, Technician Referring MD:              Medicines:                Monitored Anesthesia Care Complications:            No immediate complications. Estimated blood loss:                            None. Estimated Blood Loss:     Estimated blood loss: none. Procedure:                Pre-Anesthesia Assessment:                           - Prior to the procedure, a History and Physical                            was performed, and patient medications and                            allergies were reviewed. The patient's tolerance of                            previous anesthesia was also reviewed. The risks                            and benefits of the procedure and the sedation                            options and risks were discussed with the patient.                            All questions were answered, and informed consent                            was obtained. Prior Anticoagulants: The patient has                            taken Eliquis (apixaban), last dose was 2 days                            prior to procedure. ASA Grade Assessment: IV - A  patient with severe systemic disease that is a                            constant threat to life. After reviewing the risks                            and benefits, the patient was deemed in                            satisfactory condition to undergo the procedure.                           After obtaining informed consent, the colonoscope                            was passed under direct vision. Throughout  the                            procedure, the patient's blood pressure, pulse, and                            oxygen saturations were monitored continuously. The                            CF-HQ190L ([id number]) Olympus colonoscope was                            introduced through the anus and advanced to the the                            cecum, identified by appendiceal orifice and                            ileocecal valve. The colonoscopy was performed                            without difficulty. The patient tolerated the                            procedure well. The quality of the bowel                            preparation was good. The ileocecal valve,                            appendiceal orifice, and rectum were photographed. Scope In: 11:00:59 AM Scope Out: 11:23:42 AM Scope Withdrawal Time: 0 hours 12 minutes 19 seconds  Total Procedure Duration: 0 hours 22 minutes 43 seconds  Findings:      Two sessile polyps were found in the transverse colon and ascending       colon. The polyps were 4 to 5 mm in size. These polyps were removed with       a cold snare. Resection and retrieval were complete.      Multiple small and large-mouthed  diverticula were found in the left       colon.      The exam was otherwise without abnormality on direct and retroflexion       views. Impression:               - Two 4 to 5 mm polyps in the transverse colon and                            in the ascending colon, removed with a cold snare.                            Resected and retrieved.                           - Diverticulosis in the left colon.                           - The examination was otherwise normal on direct                            and retroflexion views. Moderate Sedation:      Not Applicable - Patient had care per Anesthesia. Recommendation:           - EGD now.                           - Await pathology results. Procedure Code(s):        --- Professional ---                            [phone], Colonoscopy, flexible; with removal of                            tumor(s), polyp(s), or other lesion(s) by snare                            technique Diagnosis Code(s):        --- Professional ---                           K63.5, Polyp of colon                           D50.9, Iron deficiency anemia, unspecified                           K57.30, Diverticulosis of large intestine without                            perforation or abscess without bleeding CPT copyright 2019 American Medical Association. All rights reserved. The codes documented in this report are preliminary and upon coder review may  be revised to meet current compliance requirements. Milus Banister, MD 08/06/2020 11:23:33 AM This report has been signed electronically. Number of Addenda: 0

## 2020-08-06 NOTE — Transfer of Care (Signed)
Immediate Anesthesia Transfer of Care Note  Patient: Rhonda [name]  Procedure(s) Performed: COLONOSCOPY WITH PROPOFOL (N/A ) ESOPHAGOGASTRODUODENOSCOPY (EGD) WITH PROPOFOL (N/A ) POLYPECTOMY BIOPSY  Patient Location: PACU  Anesthesia Type:MAC  Level of Consciousness: sedated, patient cooperative and responds to stimulation  Airway & Oxygen Therapy: Patient Spontanous Breathing and Patient connected to face mask oxygen  Post-op Assessment: Report given to RN and Post -op Vital signs reviewed and stable  Post vital signs: Reviewed and stable  Last Vitals:  Vitals Value Taken Time  BP    Temp    Pulse    Resp    SpO2      Last Pain:  Vitals:   08/06/20 0959  TempSrc: Oral  PainSc: 0-No pain         Complications: No complications documented.

## 2020-08-07 ENCOUNTER — Encounter (HOSPITAL_COMMUNITY): Payer: Self-pay | Admitting: Gastroenterology

## 2020-08-07 LAB — SURGICAL PATHOLOGY

## 2020-08-13 ENCOUNTER — Telehealth: Payer: Self-pay | Admitting: Gastroenterology

## 2020-08-13 ENCOUNTER — Other Ambulatory Visit: Payer: Self-pay

## 2020-08-13 DIAGNOSIS — K7031 Alcoholic cirrhosis of liver with ascites: Secondary | ICD-10-CM

## 2020-08-13 DIAGNOSIS — D509 Iron deficiency anemia, unspecified: Secondary | ICD-10-CM

## 2020-08-13 NOTE — Telephone Encounter (Signed)
Pt is returning a missed phone call from the nurse.

## 2020-08-13 NOTE — Telephone Encounter (Signed)
See results note 08/13/20

## 2020-08-17 DIAGNOSIS — Z79899 Other long term (current) drug therapy: Secondary | ICD-10-CM | POA: Diagnosis not present

## 2020-08-18 LAB — DIGOXIN LEVEL: Digoxin, Serum: 0.9 ng/mL (ref 0.5–0.9)

## 2020-08-19 DIAGNOSIS — E785 Hyperlipidemia, unspecified: Secondary | ICD-10-CM | POA: Diagnosis not present

## 2020-08-19 DIAGNOSIS — E669 Obesity, unspecified: Secondary | ICD-10-CM | POA: Diagnosis not present

## 2020-08-19 DIAGNOSIS — R69 Illness, unspecified: Secondary | ICD-10-CM | POA: Diagnosis not present

## 2020-08-19 DIAGNOSIS — I110 Hypertensive heart disease with heart failure: Secondary | ICD-10-CM | POA: Diagnosis not present

## 2020-08-19 DIAGNOSIS — J449 Chronic obstructive pulmonary disease, unspecified: Secondary | ICD-10-CM | POA: Diagnosis not present

## 2020-08-19 DIAGNOSIS — Z008 Encounter for other general examination: Secondary | ICD-10-CM | POA: Diagnosis not present

## 2020-08-19 DIAGNOSIS — E261 Secondary hyperaldosteronism: Secondary | ICD-10-CM | POA: Diagnosis not present

## 2020-08-19 DIAGNOSIS — D6869 Other thrombophilia: Secondary | ICD-10-CM | POA: Diagnosis not present

## 2020-08-19 DIAGNOSIS — I509 Heart failure, unspecified: Secondary | ICD-10-CM | POA: Diagnosis not present

## 2020-08-19 DIAGNOSIS — I4891 Unspecified atrial fibrillation: Secondary | ICD-10-CM | POA: Diagnosis not present

## 2020-09-28 DIAGNOSIS — Z79899 Other long term (current) drug therapy: Secondary | ICD-10-CM

## 2020-09-29 ENCOUNTER — Ambulatory Visit: Payer: Medicare HMO | Admitting: Internal Medicine

## 2020-09-29 ENCOUNTER — Other Ambulatory Visit: Payer: Self-pay

## 2020-09-29 ENCOUNTER — Encounter: Payer: Self-pay | Admitting: Internal Medicine

## 2020-09-29 VITALS — BP 100/58 | HR 62 | Ht 62.5 in | Wt 175.6 lb

## 2020-09-29 DIAGNOSIS — I2510 Atherosclerotic heart disease of native coronary artery without angina pectoris: Secondary | ICD-10-CM | POA: Diagnosis not present

## 2020-09-29 DIAGNOSIS — D6869 Other thrombophilia: Secondary | ICD-10-CM | POA: Diagnosis not present

## 2020-09-29 DIAGNOSIS — I4811 Longstanding persistent atrial fibrillation: Secondary | ICD-10-CM

## 2020-09-29 DIAGNOSIS — R600 Localized edema: Secondary | ICD-10-CM | POA: Diagnosis not present

## 2020-09-29 DIAGNOSIS — Z79899 Other long term (current) drug therapy: Secondary | ICD-10-CM

## 2020-09-29 DIAGNOSIS — G4733 Obstructive sleep apnea (adult) (pediatric): Secondary | ICD-10-CM

## 2020-09-29 DIAGNOSIS — I5032 Chronic diastolic (congestive) heart failure: Secondary | ICD-10-CM | POA: Diagnosis not present

## 2020-09-29 DIAGNOSIS — K7031 Alcoholic cirrhosis of liver with ascites: Secondary | ICD-10-CM

## 2020-09-29 DIAGNOSIS — I10 Essential (primary) hypertension: Secondary | ICD-10-CM | POA: Diagnosis not present

## 2020-09-29 DIAGNOSIS — R69 Illness, unspecified: Secondary | ICD-10-CM | POA: Diagnosis not present

## 2020-09-29 MED ORDER — METOPROLOL TARTRATE 100 MG PO TABS: 100.0000 mg | ORAL_TABLET | Freq: Two times a day (BID) | ORAL | 3 refills | Status: DC

## 2020-09-29 NOTE — Patient Instructions (Signed)
Medication Instructions:  TAKE 100mg  (ONE TABLET) METOPROLOL TARTRATE IN THE MORNING AND 100mg  (ONE TABLET) IN THE EVENING *If you need a refill on your cardiac medications before your next appointment, please call your pharmacy*   Lab Work: Digoxin level in 3 months. Please return to office for this, no appointment needed.   If you have labs (blood work) drawn today and your tests are completely normal, you will receive your results only by: Marland Kitchen MyChart Message (if you have MyChart) OR . A paper copy in the mail If you have any lab test that is abnormal or we need to change your treatment, we will call you to review the results.   Follow-Up: At [facility], you and your health needs are our priority.  As part of our continuing mission to provide you with exceptional heart care, we have created designated Provider Care Teams.  These Care Teams include your primary Cardiologist (physician) and Advanced Practice Providers (APPs -  Physician Assistants and Nurse Practitioners) who all work together to provide you with the care you need, when you need it.  Your next appointment:   6 month(s)  The format for your next appointment:   In Person  Provider:   Cherlynn Kaiser, MD

## 2020-09-29 NOTE — Progress Notes (Deleted)
ekg 

## 2020-09-29 NOTE — Progress Notes (Signed)
Cardiology Office Note:    Date:  09/29/2020   ID:  Rhonda [name] Jun 20, 1955, MRN [id number]  PCP:  Rhonda [name], [place]  Cardiologist:  Rhonda Munroe, MD  Electrophysiologist:  None   Referring MD: Rhonda [name]   Chief Complaint/Reason for Referral: HFpEF, afib.  History of Present Illness:    Rhonda [name] is a 65 y.o. female with a history of chronic diastolic congestive heart failure, coronary artery disease with prior PCI, hypertension, persistent atrial fibrillation, history of heavy alcohol abuse recently hospitalized in January '22 with acute on chronic diastolic congestive heart failure and cirrhosis.   She is doing really well. Mild dizziness when her BP is low. Otherwise no chest pain or SOB. She ate some dill pickle chips recently and noticed more swelling - we discussed salt intake and she is aware.   08/06/20: EGD results: Small distal esophagus varices without signs of recent bleeding. Changes of mild portal gastropathy. Findings: Unusual duodenum mucosa, ? scalloped. Biopsies for histology were taken with a cold forceps in the duodenal bulb and in the first portion of the duodenum for evaluation of celiac disease. The exam was otherwise without abnormality. No gastric varices. Colonoscopy results: Two sessile polyps were found in the transverse colon and ascending colon. The polyps were 4 to 5 mm in size. These polyps were removed with a cold snare. Resection and retrieval were complete. Findings: Multiple small and large-mouthed diverticula were found in the left colon. The exam was otherwise without abnormality on direct and retroflexion views.  Past Medical History:  Diagnosis Date  . Atrial fibrillation ([place])   . CAD (coronary artery disease) 08/2009   BMS to RCA, non-obstructive in CX and LAD 2011  . CHF (congestive heart failure) ([place])   . Edema, peripheral   . Hypertension   . Nausea   . Pneumonia     January 2020  . Sleep apnea   . Trichomoniasis 06/05/2018    Past Surgical History:  Procedure Laterality Date  . BIOPSY  08/06/2020   Procedure: BIOPSY;  Surgeon: Rhonda Banister, MD;  Location: [facility];  Service: Gastroenterology;;  . CESAREAN SECTION     x 1. no BTL  . COLONOSCOPY WITH PROPOFOL N/A 08/06/2020   Procedure: COLONOSCOPY WITH PROPOFOL;  Surgeon: Rhonda Banister, MD;  Location: [facility];  Service: Gastroenterology;  Laterality: N/A;  . CORONARY ANGIOPLASTY WITH STENT PLACEMENT    . DILATION AND CURETTAGE, DIAGNOSTIC / THERAPEUTIC     for SAB  . ESOPHAGOGASTRODUODENOSCOPY (EGD) WITH PROPOFOL N/A 08/06/2020   Procedure: ESOPHAGOGASTRODUODENOSCOPY (EGD) WITH PROPOFOL;  Surgeon: Rhonda Banister, MD;  [facility];  Service: Gastroenterology;  Laterality: N/A;  . HYSTEROSCOPY WITH D & C N/A 03/13/2019   Procedure: DILATATION AND CURETTAGE /HYSTEROSCOPY;  Surgeon: Rhonda Halim, MD;  Location: [place];  Service: Gynecology;  Laterality: N/A;  . POLYPECTOMY  08/06/2020   Procedure: POLYPECTOMY;  Surgeon: Rhonda Banister, MD;  [facility];  Service: Gastroenterology;;    Current Medications: Current Meds  Medication Sig  . albuterol (PROVENTIL HFA;VENTOLIN HFA) 108 (90 Base) MCG/ACT inhaler Inhale 2 puffs into the lungs every 6 (six) hours as needed for wheezing or shortness of breath.  Marland Kitchen apixaban (ELIQUIS) 5 MG TABS tablet Take 1 tablet (5 mg total) by mouth 2 (two) times daily. Restart early afternoon on 03/14/2019  . atorvastatin (LIPITOR) 20 MG tablet Take 1 tablet (20 mg total) by mouth  daily. (Patient taking differently: Take 20 mg by mouth every evening.)  . digoxin (LANOXIN) 0.125 MG tablet Take 0.5 tablets (0.0625 mg total) by mouth daily.  Marland Kitchen diltiazem (CARDIZEM CD) 360 MG 24 hr capsule Take 1 capsule (360 mg total) by mouth daily.  . furosemide (LASIX) 80 MG tablet Take 1 tablet (80 mg total) by mouth 2 (two) times daily.   Marland Kitchen levocetirizine (XYZAL) 5 MG tablet Take 5 mg by mouth every evening.  Marland Kitchen PEG-KCl-NaCl-NaSulf-Na Asc-C (PLENVU) 140 g SOLR Take 1 kit by mouth as directed. Use coupon: BIN: 737106 PNC: CNRX Group: [id number] ID: [id number]  . spironolactone (ALDACTONE) 100 MG tablet Take 2 tablets (200 mg total) by mouth daily.  . [DISCONTINUED] metoprolol tartrate (LOPRESSOR) 100 MG tablet Take 1.5 tablets (150 mg total) by mouth 2 (two) times daily. Take 1.5 in morning and 1 at night. (Patient taking differently: Take 150 mg by mouth See admin instructions. Take 1.5 tablets (150 mg) by mouth in the morning & take 1 tablet (100 mg) by mouth at night.)     Allergies:   Ace inhibitors and Prednisone   Social History   Tobacco Use  . Smoking status: Former Smoker    Packs/day: 0.20    Types: Cigarettes  . Smokeless tobacco: Never Used  Vaping Use  . Vaping Use: Never used  Substance Use Topics  . Alcohol use: Yes    Alcohol/week: 5.0 standard drinks    Types: 2 Cans of beer, 3 Standard drinks or equivalent per week    Comment: "drink at least 3 shots a day"  . Drug use: Yes    Types: Marijuana    Comment: nightly     Family History: The patient's family history includes CAD in her maternal grandmother and mother; Diabetes Mellitus II in her father; Hypertension in her sister. There is no history of Breast cancer.  ROS:   Please see the history of present illness.    All other systems reviewed and are negative.  EKGs/Labs/Other Studies Reviewed:    The following studies were reviewed today:  EKG:  Afib rate 62 bpm  I have independently reviewed the images and report from EGD/colonoscopy 08/06/20.   Recent Labs: 06/12/2020: NT-Pro BNP 732 06/15/2020: B Natriuretic Peptide 609.0 06/29/2020: Magnesium 1.8 07/01/2020: ALT 7 07/17/2020: BUN 18; Creatinine, Ser 1.42; Hemoglobin 9.8; Platelets 227; Potassium 4.8; Sodium 136  Recent Lipid Panel No results found for: CHOL, TRIG, HDL, CHOLHDL, VLDL,  LDLCALC, LDLDIRECT  Physical Exam:    VS:  BP (!) 100/58   Pulse 62   Ht 5' 2.5" (1.588 m)   Wt 175 lb 9.6 oz (79.7 kg)   SpO2 98%   BMI 31.61 kg/m     Wt Readings from Last 5 Encounters:  09/29/20 175 lb 9.6 oz (79.7 kg)  07/31/20 193 lb (87.5 kg)  07/16/20 205 lb 3.2 oz (93.1 kg)  07/01/20 218 lb (98.9 kg)  06/24/20 215 lb (97.5 kg)    Constitutional: No acute distress Eyes: sclera non-icteric, normal conjunctiva and lids ENMT: normal dentition, moist mucous membranes Cardiovascular: irregular rhythm, normal rate, no murmurs. S1 and S2 normal. Radial pulses normal bilaterally. No jugular venous distention.  Respiratory: clear to auscultation bilaterally GI : normal bowel sounds, soft and nontender. No distention.   MSK: extremities warm, well perfused. 1+ edema.  NEURO: grossly nonfocal exam, moves all extremities. PSYCH: alert and oriented x 3, normal mood and affect.   ASSESSMENT:    1. Longstanding persistent  atrial fibrillation ([place])   2. Secondary hypercoagulable state ([place])   3. Medication management   4. Chronic diastolic HF (heart failure) ([place])   5. Ascites due to alcoholic cirrhosis ([place])   6. Lower extremity edema   7. Essential hypertension   8. OSA (obstructive sleep apnea)   9. Coronary artery disease involving native coronary artery of native heart without angina pectoris    PLAN:    Permanent AF - Eliquis 5 mg BID - continue rate controlling agents BB, CCB, Digoxin (level stable today). Will reduced dose of BB due to hypotension.  HTN - as above, reduce dose of metoprolol to 100 mg BID. May need to decrease dose of diltiazem. Could also consider switching metoprolol to nadolol for evidence of varices.   Chronic diastolic HF - overall stable, mild increase in edema due to eating salty chips. Continue spironolactone and furosemide.   Total time of encounter: 30 minutes total time of encounter, including 20 minutes spent in face-to-face patient care  on the date of this encounter. This time includes coordination of care and counseling regarding above mentioned problem list. Remainder of non-face-to-face time involved reviewing chart documents/testing relevant to the patient encounter and documentation in the medical record. I have independently reviewed documentation from referring provider.   Cherlynn Kaiser, MD, [facility]     Medication Adjustments/Labs and Tests Ordered: Current medicines are reviewed at length with the patient today.  Concerns regarding medicines are outlined above.   Orders Placed This Encounter  Procedures  . Digoxin level  . EKG 12-Lead    Meds ordered this encounter  Medications  . metoprolol tartrate (LOPRESSOR) 100 MG tablet    Sig: Take 1 tablet (100 mg total) by mouth 2 (two) times daily. Take 1 tablet in the morning and 1 tablet in evening.    Dispense:  60 tablet    Refill:  3    Patient Instructions  Medication Instructions:  TAKE 130m (ONE TABLET) METOPROLOL TARTRATE IN THE MORNING AND 1082m(ONE TABLET) IN THE EVENING *If you need a refill on your cardiac medications before your next appointment, please call your pharmacy*   Lab Work: Digoxin level in 3 months. Please return to office for this, no appointment needed.   If you have labs (blood work) drawn today and your tests are completely normal, you will receive your results only by: . Marland KitchenyChart Message (if you have MyChart) OR . A paper copy in the mail If you have any lab test that is abnormal or we need to change your treatment, we will call you to review the results.   Follow-Up: [facility] and [facility] are our priority.  As part of our continuing mission to provide you with exceptional heart care, we have created designated Provider Care Teams.  These Care Teams include your primary Cardiologist (physician) and Advanced Practice Providers (APPs -  Physician Assistants and Nurse  Practitioners) who all work together to provide you with the care you need, when you need it.  Your next appointment:   6 month(s)  The format for your next appointment:   In Person  Provider:   GaCherlynn KaiserMD

## 2020-09-30 ENCOUNTER — Other Ambulatory Visit: Payer: Self-pay

## 2020-09-30 ENCOUNTER — Encounter: Payer: Self-pay | Admitting: Gastroenterology

## 2020-09-30 ENCOUNTER — Ambulatory Visit: Payer: Medicare HMO | Admitting: Gastroenterology

## 2020-09-30 VITALS — BP 124/70 | HR 65 | Ht 62.0 in | Wt 176.8 lb

## 2020-09-30 DIAGNOSIS — K7031 Alcoholic cirrhosis of liver with ascites: Secondary | ICD-10-CM

## 2020-09-30 DIAGNOSIS — Z79899 Other long term (current) drug therapy: Secondary | ICD-10-CM

## 2020-09-30 DIAGNOSIS — R69 Illness, unspecified: Secondary | ICD-10-CM | POA: Diagnosis not present

## 2020-09-30 LAB — COMPREHENSIVE METABOLIC PANEL WITH GFR
ALT: 16 IU/L (ref 0–32)
AST: 16 IU/L (ref 0–40)
Albumin/Globulin Ratio: 1.6 (ref 1.2–2.2)
Albumin: 4.3 g/dL (ref 3.8–4.8)
Alkaline Phosphatase: 71 IU/L (ref 44–121)
BUN/Creatinine Ratio: 25 (ref 12–28)
BUN: 59 mg/dL — ABNORMAL HIGH (ref 8–27)
Bilirubin Total: 0.7 mg/dL (ref 0.0–1.2)
CO2: 26 mmol/L (ref 20–29)
Calcium: 10.8 mg/dL — ABNORMAL HIGH (ref 8.7–10.3)
Chloride: 95 mmol/L — ABNORMAL LOW (ref 96–106)
Creatinine, Ser: 2.37 mg/dL — ABNORMAL HIGH (ref 0.57–1.00)
Globulin, Total: 2.7 g/dL (ref 1.5–4.5)
Glucose: 106 mg/dL — ABNORMAL HIGH (ref 65–99)
Potassium: 4.8 mmol/L (ref 3.5–5.2)
Sodium: 138 mmol/L (ref 134–144)
Total Protein: 7 g/dL (ref 6.0–8.5)
eGFR: 22 mL/min/1.73 — ABNORMAL LOW

## 2020-09-30 LAB — CBC
Hematocrit: 39.1 % (ref 34.0–46.6)
Hemoglobin: 12.7 g/dL (ref 11.1–15.9)
MCH: 27.1 pg (ref 26.6–33.0)
MCHC: 32.5 g/dL (ref 31.5–35.7)
MCV: 84 fL (ref 79–97)
Platelets: 201 10*3/uL (ref 150–450)
RBC: 4.68 x10E6/uL (ref 3.77–5.28)
RDW: 18.2 % — ABNORMAL HIGH (ref 11.7–15.4)
WBC: 6.9 10*3/uL (ref 3.4–10.8)

## 2020-09-30 LAB — DIGOXIN LEVEL: Digoxin, Serum: 1.3 ng/mL — ABNORMAL HIGH (ref 0.5–0.9)

## 2020-09-30 NOTE — Progress Notes (Signed)
Review of pertinent gastrointestinal problems: 1. Cirrhosis.  Alcoholic.  4696 laboratory work-up: Anti-smooth muscle antibody negative, ANA negative, AMA negative, hepatitis A IgM negative, hepatitis C antibody negative, hepatitis B surface antigen negative,  EGD March 2022: Small distal esophagus varices, mild portal gastropathy.  Next EGD March 2025  Ultrasound January 2022: Nodular liver, no focal lesions.  Mild ascites.  Meld score; unreliable due to Eliquis use: Blood work April 2022: normal platelets normal total bilirubin  Last drink New Year's Eve 2021  2.  Adenomatous colon polyps.  Colonoscopy March 2022 found 2 subcentimeter adenomas.  Repeat colonoscopy at 7-year interval   HPI: This is a very pleasant 65 year old woman  Her weight is down 42 pounds since her last office visit here 3 months ago.  She had been taking Lasix 80 mg twice daily and spironolactone 200 mg once daily since her discharge from the hospital 2 or 3 months ago.  She was seen by her cardiologist yesterday and her creatinine was found to be elevated at 2.4, her baseline is normal or just slightly above 1.  They recommended she completely stop her diuretics and were planning repeat labs early next week.  She really feels great.  She is able to fit into her usual close now and her usual shoes since all of her edema, swelling has resolved.  She has had no overt GI bleeding, no encephalopathic events.  ROS: complete GI ROS as described in HPI, all other review negative.  Constitutional:  No unintentional weight loss   Past Medical History:  Diagnosis Date  . Atrial fibrillation ([place])   . CAD (coronary artery disease) 08/2009   BMS to RCA, non-obstructive in CX and LAD 2011  . CHF (congestive heart failure) ([place])   . Edema, peripheral   . Hypertension   . Nausea   . Pneumonia    January 2020  . Sleep apnea   . Trichomoniasis 06/05/2018    Past Surgical History:  Procedure Laterality Date  .  BIOPSY  08/06/2020   Procedure: BIOPSY;  Surgeon: Milus Banister, MD;  Location: [facility];  Service: Gastroenterology;;  . CESAREAN SECTION     x 1. no BTL  . COLONOSCOPY WITH PROPOFOL N/A 08/06/2020   Procedure: COLONOSCOPY WITH PROPOFOL;  Surgeon: Milus Banister, MD;  Location: [facility];  Service: Gastroenterology;  Laterality: N/A;  . CORONARY ANGIOPLASTY WITH STENT PLACEMENT    . DILATION AND CURETTAGE, DIAGNOSTIC / THERAPEUTIC     for SAB  . ESOPHAGOGASTRODUODENOSCOPY (EGD) WITH PROPOFOL N/A 08/06/2020   Procedure: ESOPHAGOGASTRODUODENOSCOPY (EGD) WITH PROPOFOL;  Surgeon: Milus Banister, MD;  [facility];  Service: Gastroenterology;  Laterality: N/A;  . HYSTEROSCOPY WITH D & C N/A 03/13/2019   Procedure: DILATATION AND CURETTAGE /HYSTEROSCOPY;  Surgeon: Aletha Halim, MD;  Location: [place];  Service: Gynecology;  Laterality: N/A;  . POLYPECTOMY  08/06/2020   Procedure: POLYPECTOMY;  Surgeon: Milus Banister, MD;  Location: [facility];  Service: Gastroenterology;;    Current Outpatient Medications  Medication Sig Dispense Refill  . albuterol (PROVENTIL HFA;VENTOLIN HFA) 108 (90 Base) MCG/ACT inhaler Inhale 2 puffs into the lungs every 6 (six) hours as needed for wheezing or shortness of breath. 1 Inhaler 2  . apixaban (ELIQUIS) 5 MG TABS tablet Take 1 tablet (5 mg total) by mouth 2 (two) times daily. Restart early afternoon on 03/14/2019 180 tablet 3  . atorvastatin (LIPITOR) 20 MG tablet Take 1 tablet (20 mg total) by mouth  daily. (Patient taking differently: Take 20 mg by mouth every evening.) 90 tablet 3  . digoxin (LANOXIN) 0.125 MG tablet Take 0.5 tablets (0.0625 mg total) by mouth daily. 45 tablet 3  . diltiazem (CARDIZEM CD) 360 MG 24 hr capsule Take 1 capsule (360 mg total) by mouth daily. 90 capsule 3  . furosemide (LASIX) 80 MG tablet Take 1 tablet (80 mg total) by mouth 2 (two) times daily. 200 tablet 3  . metoprolol tartrate (LOPRESSOR)  100 MG tablet Take 1 tablet (100 mg total) by mouth 2 (two) times daily. Take 1 tablet in the morning and 1 tablet in evening. 60 tablet 3  . spironolactone (ALDACTONE) 100 MG tablet Take 2 tablets (200 mg total) by mouth daily. 180 tablet 3   No current facility-administered medications for this visit.    Allergies as of 09/30/2020 - Review Complete 09/30/2020  Allergen Reaction Noted  . Ace inhibitors Cough 02/04/2016  . Prednisone Itching and Swelling 07/03/2018    Family History  Problem Relation Age of Onset  . CAD Mother   . Diabetes Mellitus II Father   . Hypertension Sister   . CAD Maternal Grandmother   . Breast cancer Neg Hx     Social History   Socioeconomic History  . Marital status: Divorced    Spouse name: Not on file  . Number of children: Not on file  . Years of education: Not on file  . Highest education level: Not on file  Occupational History  . Not on file  Tobacco Use  . Smoking status: Former Smoker    Packs/day: 0.20    Types: Cigarettes  . Smokeless tobacco: Never Used  Vaping Use  . Vaping Use: Never used  Substance and Sexual Activity  . Alcohol use: Yes    Alcohol/week: 5.0 standard drinks    Types: 2 Cans of beer, 3 Standard drinks or equivalent per week    Comment: "drink at least 3 shots a day"  . Drug use: Yes    Types: Marijuana    Comment: nightly  . Sexual activity: Yes    Birth control/protection: Post-menopausal  Other Topics Concern  . Not on file  Social History Narrative  . Not on file   Social Determinants of Health   Financial Resource Strain: Not on file  Food Insecurity: Not on file  Transportation Needs: Not on file  Physical Activity: Not on file  Stress: Not on file  Social Connections: Not on file  Intimate Partner Violence: Not on file    Physical Exam: BP 124/70   Pulse 65   Ht 5\' 2"  (1.575 m)   Wt 176 lb 12.8 oz (80.2 kg)   SpO2 95%   BMI 32.34 kg/m  Constitutional: generally  well-appearing Psychiatric: alert and oriented x3 Abdomen: soft, nontender, nondistended, no obvious ascites, no peritoneal signs, normal bowel sounds No peripheral edema noted in lower extremities  Assessment and plan: 65 y.o. female with alcoholic cirrhosis  She really looks great today.  No lower extremity edema and no ascites.  Currently her diuretics are on hold.  She is going to get a repeat set of labs early next week ordered by her cardiologist and we will adjust her diuretic doses from there.  She knows to stay in tune with her fluid status, checking her ankles for edema and weighing herself daily.  She knows to call me if she starts to significantly putting fluid weight on again.  I will reach out to  her cardiologist and offered to manage her diuretics going forward.  Her ascites and edema is almost clearly related to her liver disease and not her heart disease.  She will return to see me in 3 months and sooner if needed.  Will need to send labs at that time for total hepatitis A antibody, hepatitis B surface antibody to check her immune status and immunize if necessary.  Please see the "Patient Instructions" section for addition details about the plan.  Owens Loffler, MD [facility] 09/30/2020, 2:24 PM   Total time on date of encounter was 30 minutes (this included time spent preparing to see the patient reviewing records; obtaining and/or reviewing separately obtained history; performing a medically appropriate exam and/or evaluation; counseling and educating the patient and family if present; ordering medications, tests or procedures if applicable; and documenting clinical information in the health record).

## 2020-09-30 NOTE — Patient Instructions (Addendum)
If you are age 63 or younger, your body mass index should be between 19-25. Your Body mass index is 32.34 kg/m. If this is out of the aformentioned range listed, please consider follow up with your Primary Care Provider.   You will follow up in our office on 12-23-2020 at 3:40pm.  Please arrive at 3:30pm for registration.  Lab work ordered by cardiology will be reviewed next week.  Thank you for entrusting me with your care and choosing [facility].  Dr Ardis Hughs

## 2020-10-05 ENCOUNTER — Telehealth: Payer: Self-pay | Admitting: Gastroenterology

## 2020-10-05 NOTE — Telephone Encounter (Signed)
Please see note left in Nicole's box.

## 2020-10-05 NOTE — Telephone Encounter (Signed)
Inbound call from patient called in to report that she have gained 6 pounds since Wednesday 09/30/2020. Also, she went to get her labs at [facility] but they were not in yet. Best contact number [phone]

## 2020-10-06 ENCOUNTER — Other Ambulatory Visit: Payer: Self-pay | Admitting: Nurse Practitioner

## 2020-10-06 ENCOUNTER — Telehealth: Payer: Self-pay

## 2020-10-06 DIAGNOSIS — Z1231 Encounter for screening mammogram for malignant neoplasm of breast: Secondary | ICD-10-CM

## 2020-10-06 DIAGNOSIS — I5041 Acute combined systolic (congestive) and diastolic (congestive) heart failure: Secondary | ICD-10-CM

## 2020-10-06 DIAGNOSIS — I484 Atypical atrial flutter: Secondary | ICD-10-CM

## 2020-10-06 DIAGNOSIS — K7031 Alcoholic cirrhosis of liver with ascites: Secondary | ICD-10-CM

## 2020-10-06 NOTE — Telephone Encounter (Signed)
Left message on voicemail for patient to return call to discuss lab work.  Will continue efforts.

## 2020-10-06 NOTE — Telephone Encounter (Signed)
Patient states she went to [facility] yesterday, but was told there was no orders in the system.  I do see where Cardiology ordered a CBC, CMP, and Digoxin level.  Is it OK if I reorder these labs for the patient by you?   She prefers to have them drawn at [facility] as it is right down the road from her house and driving to Cardiology office is out of the way for her.

## 2020-10-06 NOTE — Telephone Encounter (Signed)
-----   Message from Milus Banister, MD sent at 10/06/2020 11:32 AM EDT ----- Regarding: RE: Review lab work OK, please contact her. She needs bmet today or tomorrow.  Thanks   ----- Message ----- From: Stevan Born, CMA Sent: 10/06/2020  10:52 AM EDT To: Milus Banister, MD Subject: Review lab work                                Patient was supposed to have lab work drawn by cardiology.  Looks like she had labs on 09-29-2020, but has not had any other lab work.  You wanted to review labs and advised on how to restart lasix and spironolactone.

## 2020-10-06 NOTE — Telephone Encounter (Signed)
Being handled by Nicole 

## 2020-10-07 NOTE — Telephone Encounter (Signed)
Lab orders entered

## 2020-10-07 NOTE — Telephone Encounter (Signed)
Yes, that is OK. In addition to those labs please order an INR.  Thanks

## 2020-10-08 ENCOUNTER — Other Ambulatory Visit: Payer: Self-pay | Admitting: Gastroenterology

## 2020-10-08 DIAGNOSIS — I484 Atypical atrial flutter: Secondary | ICD-10-CM | POA: Diagnosis not present

## 2020-10-08 DIAGNOSIS — R69 Illness, unspecified: Secondary | ICD-10-CM | POA: Diagnosis not present

## 2020-10-08 DIAGNOSIS — I5041 Acute combined systolic (congestive) and diastolic (congestive) heart failure: Secondary | ICD-10-CM | POA: Diagnosis not present

## 2020-10-08 NOTE — Telephone Encounter (Signed)
Returned call to patient to inform her that lab orders were entered into the computer system and I am unsure of why they are not able to reviewed by [employer].  I advised patient that I would manually fax orders to [phone].  Orders faxed.  Fax confirmation was received.    Phone call to [facility] at [phone] was made and I spoke with Manuela Schwartz and the call center.  Manuela Schwartz contacted [facility] on [employer] and spoke with Herschel Senegal to confirm that lab orders had been received, and they were.  Phone call to patient with information that lab orders had been received and she should be able to have labs drawn.  Advised patient of specific lab orders that she should have drawn.  Patient will return to [facility] for lab draw.  Patient agreed to plan and verbalized understanding.  No further questions.  Patient thanked me for the call.

## 2020-10-08 NOTE — Telephone Encounter (Signed)
Patient called states [facility] does not have the orders.

## 2020-10-09 LAB — COMPREHENSIVE METABOLIC PANEL
ALT: 14 IU/L (ref 0–32)
AST: 20 IU/L (ref 0–40)
Albumin/Globulin Ratio: 1.4 (ref 1.2–2.2)
Albumin: 3.9 g/dL (ref 3.8–4.8)
Alkaline Phosphatase: 72 IU/L (ref 44–121)
BUN/Creatinine Ratio: 16 (ref 12–28)
BUN: 20 mg/dL (ref 8–27)
Bilirubin Total: 0.6 mg/dL (ref 0.0–1.2)
CO2: 23 mmol/L (ref 20–29)
Calcium: 9.8 mg/dL (ref 8.7–10.3)
Chloride: 101 mmol/L (ref 96–106)
Creatinine, Ser: 1.27 mg/dL — ABNORMAL HIGH (ref 0.57–1.00)
Globulin, Total: 2.7 g/dL (ref 1.5–4.5)
Glucose: 74 mg/dL (ref 65–99)
Potassium: 4.5 mmol/L (ref 3.5–5.2)
Sodium: 138 mmol/L (ref 134–144)
Total Protein: 6.6 g/dL (ref 6.0–8.5)
eGFR: 48 mL/min/{1.73_m2} — ABNORMAL LOW (ref >59)

## 2020-10-09 LAB — DIGOXIN LEVEL: Digoxin, Serum: 0.5 ng/mL (ref 0.5–0.9)

## 2020-10-09 LAB — CBC
Hematocrit: 37.4 % (ref 34.0–46.6)
Hemoglobin: 12.1 g/dL (ref 11.1–15.9)
MCH: 28.1 pg (ref 26.6–33.0)
MCHC: 32.4 g/dL (ref 31.5–35.7)
MCV: 87 fL (ref 79–97)
Platelets: 195 10*3/uL (ref 150–450)
RBC: 4.31 x10E6/uL (ref 3.77–5.28)
RDW: 17.9 % — ABNORMAL HIGH (ref 11.7–15.4)
WBC: 8.2 10*3/uL (ref 3.4–10.8)

## 2020-10-09 LAB — PROTIME-INR
INR: 1 (ref 0.9–1.2)
Prothrombin Time: 10.9 s (ref 9.1–12.0)

## 2020-10-12 ENCOUNTER — Other Ambulatory Visit: Payer: Self-pay

## 2020-10-12 DIAGNOSIS — Z79899 Other long term (current) drug therapy: Secondary | ICD-10-CM

## 2020-10-13 ENCOUNTER — Telehealth: Payer: Self-pay

## 2020-10-13 DIAGNOSIS — K7031 Alcoholic cirrhosis of liver with ascites: Secondary | ICD-10-CM

## 2020-10-13 DIAGNOSIS — I5041 Acute combined systolic (congestive) and diastolic (congestive) heart failure: Secondary | ICD-10-CM

## 2020-10-13 MED ORDER — SPIRONOLACTONE 100 MG PO TABS: 100.0000 mg | ORAL_TABLET | Freq: Every day | ORAL | 6 refills | Status: DC

## 2020-10-13 MED ORDER — FUROSEMIDE 40 MG PO TABS: 40.0000 mg | ORAL_TABLET | Freq: Every day | ORAL | 6 refills | Status: DC

## 2020-10-13 NOTE — Telephone Encounter (Signed)
Patient advised to restart Lasix 40mg  daily and spironolactone 100mg  daily.  Patient will recheck BMP in 2 weeks at [facility] per patients request.  Rx sent to pharmacy.  Patient agreed to plan and verbalized understanding.  No further questions.

## 2020-10-23 ENCOUNTER — Ambulatory Visit
Admission: RE | Admit: 2020-10-23 | Discharge: 2020-10-23 | Disposition: A | Discharge status: Home / Self Care | Payer: Medicare HMO | Source: Ambulatory Visit | Attending: Nurse Practitioner | Admitting: Nurse Practitioner

## 2020-10-23 ENCOUNTER — Other Ambulatory Visit: Payer: Self-pay

## 2020-10-23 DIAGNOSIS — Z1231 Encounter for screening mammogram for malignant neoplasm of breast: Secondary | ICD-10-CM | POA: Insufficient documentation

## 2020-10-30 DIAGNOSIS — Z79899 Other long term (current) drug therapy: Secondary | ICD-10-CM | POA: Diagnosis not present

## 2020-10-31 LAB — COMPREHENSIVE METABOLIC PANEL
ALT: 13 IU/L (ref 0–32)
AST: 14 IU/L (ref 0–40)
Albumin/Globulin Ratio: 1.7 (ref 1.2–2.2)
Albumin: 4.2 g/dL (ref 3.8–4.8)
Alkaline Phosphatase: 79 IU/L (ref 44–121)
BUN/Creatinine Ratio: 18 (ref 12–28)
BUN: 28 mg/dL — ABNORMAL HIGH (ref 8–27)
Bilirubin Total: 0.5 mg/dL (ref 0.0–1.2)
CO2: 27 mmol/L (ref 20–29)
Calcium: 10.2 mg/dL (ref 8.7–10.3)
Chloride: 98 mmol/L (ref 96–106)
Creatinine, Ser: 1.6 mg/dL — ABNORMAL HIGH (ref 0.57–1.00)
Globulin, Total: 2.5 g/dL (ref 1.5–4.5)
Glucose: 98 mg/dL (ref 65–99)
Potassium: 5 mmol/L (ref 3.5–5.2)
Sodium: 137 mmol/L (ref 134–144)
Total Protein: 6.7 g/dL (ref 6.0–8.5)
eGFR: 36 mL/min/{1.73_m2} — ABNORMAL LOW (ref >59)

## 2020-10-31 LAB — DIGOXIN LEVEL: Digoxin, Serum: 0.5 ng/mL (ref 0.5–0.9)

## 2020-11-04 ENCOUNTER — Other Ambulatory Visit: Payer: Self-pay

## 2020-11-04 DIAGNOSIS — K7031 Alcoholic cirrhosis of liver with ascites: Secondary | ICD-10-CM

## 2020-11-17 ENCOUNTER — Other Ambulatory Visit (INDEPENDENT_AMBULATORY_CARE_PROVIDER_SITE_OTHER): Payer: Medicare HMO

## 2020-11-17 DIAGNOSIS — K7031 Alcoholic cirrhosis of liver with ascites: Secondary | ICD-10-CM

## 2020-11-17 DIAGNOSIS — R69 Illness, unspecified: Secondary | ICD-10-CM | POA: Diagnosis not present

## 2020-11-17 LAB — BASIC METABOLIC PANEL
BUN: 19 mg/dL (ref 6–23)
CO2: 31 mEq/L (ref 19–32)
Calcium: 9.6 mg/dL (ref 8.4–10.5)
Chloride: 99 mEq/L (ref 96–112)
Creatinine, Ser: 1.3 mg/dL — ABNORMAL HIGH (ref 0.40–1.20)
GFR: 43.82 mL/min — ABNORMAL LOW (ref >60.00)
Glucose, Bld: 103 mg/dL — ABNORMAL HIGH (ref 70–99)
Potassium: 4.1 mEq/L (ref 3.5–5.1)
Sodium: 135 mEq/L (ref 135–145)

## 2020-11-24 ENCOUNTER — Other Ambulatory Visit: Payer: Self-pay

## 2020-11-24 DIAGNOSIS — I5041 Acute combined systolic (congestive) and diastolic (congestive) heart failure: Secondary | ICD-10-CM

## 2020-11-24 DIAGNOSIS — Z7901 Long term (current) use of anticoagulants: Secondary | ICD-10-CM

## 2020-11-24 DIAGNOSIS — K7031 Alcoholic cirrhosis of liver with ascites: Secondary | ICD-10-CM

## 2020-12-17 ENCOUNTER — Other Ambulatory Visit: Payer: Self-pay | Admitting: Gastroenterology

## 2020-12-17 DIAGNOSIS — R69 Illness, unspecified: Secondary | ICD-10-CM | POA: Diagnosis not present

## 2020-12-17 DIAGNOSIS — I5041 Acute combined systolic (congestive) and diastolic (congestive) heart failure: Secondary | ICD-10-CM | POA: Diagnosis not present

## 2020-12-18 LAB — BASIC METABOLIC PANEL
BUN/Creatinine Ratio: 18 (ref 12–28)
BUN: 22 mg/dL (ref 8–27)
CO2: 27 mmol/L (ref 20–29)
Calcium: 10.4 mg/dL — ABNORMAL HIGH (ref 8.7–10.3)
Chloride: 97 mmol/L (ref 96–106)
Creatinine, Ser: 1.2 mg/dL — ABNORMAL HIGH (ref 0.57–1.00)
Glucose: 96 mg/dL (ref 65–99)
Potassium: 5.2 mmol/L (ref 3.5–5.2)
Sodium: 135 mmol/L (ref 134–144)
eGFR: 51 mL/min/{1.73_m2} — ABNORMAL LOW (ref >59)

## 2020-12-23 ENCOUNTER — Encounter: Payer: Self-pay | Admitting: Gastroenterology

## 2020-12-23 ENCOUNTER — Ambulatory Visit: Payer: Medicare HMO | Admitting: Gastroenterology

## 2020-12-23 VITALS — BP 100/62 | HR 70 | Ht 62.0 in | Wt 192.0 lb

## 2020-12-23 DIAGNOSIS — K7031 Alcoholic cirrhosis of liver with ascites: Secondary | ICD-10-CM

## 2020-12-23 DIAGNOSIS — R69 Illness, unspecified: Secondary | ICD-10-CM | POA: Diagnosis not present

## 2020-12-23 NOTE — Patient Instructions (Signed)
If you are age 63 or younger, your body mass index should be between 19-25. Your Body mass index is 35.12 kg/m. If this is out of the aformentioned range listed, please consider follow up with your Primary Care Provider.  _________________________________________________________  The [facility] providers would like to encourage you to use [facility] to communicate with providers for non-urgent requests or questions.  Due to long hold times on the telephone, sending your provider a message by [facility] may be a faster and more efficient way to get a response.  Please allow 48 business hours for a response.  Please remember that this is for non-urgent requests.  _________________________________________________________  Rhonda [name] have been scheduled for an abdominal ultrasound at [facility] (1st floor of hospital) on 12-28-2020 at 10am. Please arrive 15 minutes prior to your appointment for registration. Make certain not to have anything to eat or drink after midnight the night prior to your appointment. Should you need to reschedule your appointment, please contact radiology at [phone]. This test typically takes about 30 minutes to perform.  Follow up will be determined after results of ultrasound have been reviewed.  Due to recent changes in healthcare laws, you may see the results of your imaging and laboratory studies on MyChart before your provider has had a chance to review them.  We understand that in some cases there may be results that are confusing or concerning to you. Not all laboratory results come back in the same time frame and the provider may be waiting for multiple results in order to interpret others.  Please give [place] 48 hours in order for your provider to thoroughly review all the results before contacting the office for clarification of your results.   Thank you for entrusting me with your care and choosing [facility].  Dr Ardis Hughs

## 2020-12-23 NOTE — Progress Notes (Signed)
Review of pertinent gastrointestinal problems: 1. Cirrhosis.  Alcoholic.  8502 laboratory work-up: Anti-smooth muscle antibody negative, ANA negative, AMA negative, hepatitis A IgM negative, hepatitis C antibody negative, hepatitis B surface antigen negative, EGD March 2022: Small distal esophagus varices, mild portal gastropathy.  Next EGD March 2025 Ultrasound January 2022: Nodular liver, no focal lesions.  Mild ascites. Meld score; unreliable due to Eliquis use: Blood work April 2022: normal platelets normal total bilirubin Last drink New Year's Eve 2021   2.  Adenomatous colon polyps.  Colonoscopy March 2022 found 2 subcentimeter adenomas.  Repeat colonoscopy at 7-year interval   HPI: This is a very pleasant 65 year old woman  I last saw her here in the office about 3 months ago.  She had completely stopped her diuretics after creatinine was found to be quite elevated.  I was in communication with her cardiologist and we agreed that I would be primarily in charge of her diuretic doses so as to eliminate any potential overlap, miscommunication.  Basic metabolic profile 1 week ago showed her creatinine has really improved to 1.2.  Current diuretic regimen Lasix 40 mg once daily, spironolactone 50 mg once daily.  Her weight is up 16 pounds since her last office visit here at which time she had no peripheral edema.  She overall feels well.  No overt GI bleeding, no overt encephalopathic events.  She is pretty clear about her diuretic dosing and that she is taking 50 mg of Aldactone once daily and 40 mg of Lasix once daily.  She drank a single frozen margarita a few weeks ago.  That is the first and only drink she has had since New Year's Eve 2021.  ROS: complete GI ROS as described in HPI, all other review negative.  Constitutional:  No unintentional weight loss   Past Medical History:  Diagnosis Date   Atrial fibrillation ([place])    CAD (coronary artery disease) 08/2009   BMS to RCA,  non-obstructive in CX and LAD 2011   CHF (congestive heart failure) ([facility])    Edema, peripheral    Hypertension    Nausea    Pneumonia    January 2020   Sleep apnea    Trichomoniasis 06/05/2018    Past Surgical History:  Procedure Laterality Date   BIOPSY  08/06/2020   Procedure: BIOPSY;  Surgeon: Milus Banister, MD;  Location: [facility];  Service: Gastroenterology;;   CESAREAN SECTION     x 1. no BTL   COLONOSCOPY WITH PROPOFOL N/A 08/06/2020   Procedure: COLONOSCOPY WITH PROPOFOL;  Surgeon: Milus Banister, MD;  Location: [facility];  Service: Gastroenterology;  Laterality: N/A;   CORONARY ANGIOPLASTY WITH STENT PLACEMENT     DILATION AND CURETTAGE, DIAGNOSTIC / THERAPEUTIC     for SAB   ESOPHAGOGASTRODUODENOSCOPY (EGD) WITH PROPOFOL N/A 08/06/2020   Procedure: ESOPHAGOGASTRODUODENOSCOPY (EGD) WITH PROPOFOL;  Surgeon: Milus Banister, MD;  Location: [facility];  Service: Gastroenterology;  Laterality: N/A;   HYSTEROSCOPY WITH D & C N/A 03/13/2019   Procedure: DILATATION AND CURETTAGE /HYSTEROSCOPY;  Surgeon: Aletha Halim, MD;  Location: [facility];  Service: Gynecology;  Laterality: N/A;   POLYPECTOMY  08/06/2020   Procedure: POLYPECTOMY;  Surgeon: Milus Banister, MD;  Location: [facility];  Service: Gastroenterology;;    Current Outpatient Medications  Medication Sig Dispense Refill   albuterol (PROVENTIL HFA;VENTOLIN HFA) 108 (90 Base) MCG/ACT inhaler Inhale 2 puffs into the lungs every 6 (six) hours as needed for wheezing or shortness  of breath. 1 Inhaler 2   apixaban (ELIQUIS) 5 MG TABS tablet Take 1 tablet (5 mg total) by mouth 2 (two) times daily. Restart early afternoon on 03/14/2019 180 tablet 3   atorvastatin (LIPITOR) 20 MG tablet Take 1 tablet (20 mg total) by mouth daily. (Patient taking differently: Take 20 mg by mouth every evening.) 90 tablet 3   digoxin (LANOXIN) 0.125 MG tablet Take 0.5 tablets (0.0625 mg total) by mouth daily. 45 tablet 3    diltiazem (CARDIZEM CD) 360 MG 24 hr capsule Take 1 capsule (360 mg total) by mouth daily. 90 capsule 3   furosemide (LASIX) 40 MG tablet Take 1 tablet (40 mg total) by mouth daily. 30 tablet 6   levocetirizine (XYZAL) 5 MG tablet Take 1 tablet by mouth daily.     metoprolol tartrate (LOPRESSOR) 100 MG tablet Take 1 tablet (100 mg total) by mouth 2 (two) times daily. Take 1 tablet in the morning and 1 tablet in evening. 60 tablet 3   spironolactone (ALDACTONE) 100 MG tablet Take 1 tablet (100 mg total) by mouth daily. 30 tablet 6   No current facility-administered medications for this visit.    Allergies as of 12/23/2020 - Review Complete 12/23/2020  Allergen Reaction Noted   Ace inhibitors Cough 02/04/2016   Prednisone Itching and Swelling 07/03/2018    Family History  Problem Relation Age of Onset   CAD Mother    Diabetes Mellitus II Father    Hypertension Sister    CAD Maternal Grandmother    Breast cancer Neg Hx     Social History   Socioeconomic History   Marital status: Married    Spouse name: Not on file   Number of children: Not on file   Years of education: Not on file   Highest education level: Not on file  Occupational History   Not on file  Tobacco Use   Smoking status: Former    Packs/day: 0.20    Types: Cigarettes   Smokeless tobacco: Never  Vaping Use   Vaping Use: Never used  Substance and Sexual Activity   Alcohol use: Yes    Alcohol/week: 5.0 standard drinks    Types: 2 Cans of beer, 3 Standard drinks or equivalent per week    Comment: "drink at least 3 shots a day"   Drug use: Yes    Types: Marijuana    Comment: nightly   Sexual activity: Yes    Birth control/protection: Post-menopausal  Other Topics Concern   Not on file  Social History Narrative   Not on file   Social Determinants of Health   Financial Resource Strain: Not on file  Food Insecurity: Not on file  Transportation Needs: Not on file  Physical Activity: Not on file   Stress: Not on file  Social Connections: Not on file  Intimate Partner Violence: Not on file     Physical Exam: BP 100/62   Pulse 70   Ht 5\' 2"  (1.575 m)   Wt 192 lb (87.1 kg)   BMI 35.12 kg/m  Constitutional: generally well-appearing Psychiatric: alert and oriented x3 Abdomen: soft, nontender, nondistended, no obvious ascites, no peritoneal signs, normal bowel sounds Thick ankles, only trace lower extremity pitting edema however.  Assessment and plan: 65 y.o. female with alcoholic cirrhosis  Seems like we have found a good diuretic regimen for her.  She is on Lasix 40 mg once daily and Aldactone 50 mg once daily.  This does result in trace lower extremity  edema but higher doses are causing renal insufficiency and so I am going to keep her at her current dosage.  She is due for hepatoma screening with ultrasound and we will order that today.  When I see that result we will coordinate follow-up office visit about 6 months from now with blood work and hepatoma screening ultrasound a few days prior.  Please see the "Patient Instructions" section for addition details about the plan.  Owens Loffler, MD [facility] 12/23/2020, 3:53 PM   Total time on date of encounter was 25 minutes (this included time spent preparing to see the patient reviewing records; obtaining and/or reviewing separately obtained history; performing a medically appropriate exam and/or evaluation; counseling and educating the patient and family if present; ordering medications, tests or procedures if applicable; and documenting clinical information in the health record).

## 2020-12-28 ENCOUNTER — Ambulatory Visit (HOSPITAL_COMMUNITY)
Admission: RE | Admit: 2020-12-28 | Discharge: 2020-12-28 | Disposition: A | Discharge status: Home / Self Care | Payer: Medicare HMO | Source: Ambulatory Visit | Attending: Gastroenterology | Admitting: Gastroenterology

## 2020-12-28 ENCOUNTER — Other Ambulatory Visit: Payer: Self-pay

## 2020-12-28 DIAGNOSIS — R188 Other ascites: Secondary | ICD-10-CM | POA: Diagnosis not present

## 2020-12-28 DIAGNOSIS — R69 Illness, unspecified: Secondary | ICD-10-CM | POA: Diagnosis not present

## 2020-12-28 DIAGNOSIS — K7031 Alcoholic cirrhosis of liver with ascites: Secondary | ICD-10-CM | POA: Insufficient documentation

## 2020-12-31 ENCOUNTER — Other Ambulatory Visit: Payer: Self-pay

## 2020-12-31 DIAGNOSIS — Z79899 Other long term (current) drug therapy: Secondary | ICD-10-CM

## 2021-01-03 ENCOUNTER — Other Ambulatory Visit: Payer: Self-pay | Admitting: Internal Medicine

## 2021-02-17 ENCOUNTER — Other Ambulatory Visit: Payer: Self-pay | Admitting: Gastroenterology

## 2021-03-04 DIAGNOSIS — I2510 Atherosclerotic heart disease of native coronary artery without angina pectoris: Secondary | ICD-10-CM | POA: Diagnosis not present

## 2021-03-04 DIAGNOSIS — Z23 Encounter for immunization: Secondary | ICD-10-CM | POA: Diagnosis not present

## 2021-03-04 DIAGNOSIS — J449 Chronic obstructive pulmonary disease, unspecified: Secondary | ICD-10-CM | POA: Diagnosis not present

## 2021-03-04 DIAGNOSIS — I509 Heart failure, unspecified: Secondary | ICD-10-CM | POA: Diagnosis not present

## 2021-03-04 DIAGNOSIS — E669 Obesity, unspecified: Secondary | ICD-10-CM | POA: Diagnosis not present

## 2021-04-14 DIAGNOSIS — Z9842 Cataract extraction status, left eye: Secondary | ICD-10-CM | POA: Diagnosis not present

## 2021-04-14 DIAGNOSIS — H0288B Meibomian gland dysfunction left eye, upper and lower eyelids: Secondary | ICD-10-CM | POA: Diagnosis not present

## 2021-04-14 DIAGNOSIS — H0288A Meibomian gland dysfunction right eye, upper and lower eyelids: Secondary | ICD-10-CM | POA: Diagnosis not present

## 2021-04-14 DIAGNOSIS — H52223 Regular astigmatism, bilateral: Secondary | ICD-10-CM | POA: Diagnosis not present

## 2021-04-14 DIAGNOSIS — H5203 Hypermetropia, bilateral: Secondary | ICD-10-CM | POA: Diagnosis not present

## 2021-04-14 DIAGNOSIS — Z9841 Cataract extraction status, right eye: Secondary | ICD-10-CM | POA: Diagnosis not present

## 2021-04-14 DIAGNOSIS — H353121 Nonexudative age-related macular degeneration, left eye, early dry stage: Secondary | ICD-10-CM | POA: Diagnosis not present

## 2021-06-28 ENCOUNTER — Other Ambulatory Visit: Payer: Self-pay | Admitting: Internal Medicine

## 2021-07-07 NOTE — Telephone Encounter (Signed)
Error

## 2021-07-14 ENCOUNTER — Telehealth: Payer: Self-pay

## 2021-07-14 DIAGNOSIS — K7031 Alcoholic cirrhosis of liver with ascites: Secondary | ICD-10-CM

## 2021-07-14 NOTE — Telephone Encounter (Signed)
Appt has been scheduled for 08/06/21 at 1030 am with Dr Ardis Hughs Lab orders have been entered  [place] order sent to the schedulers.    No answer and voice mail not set up

## 2021-07-14 NOTE — Telephone Encounter (Signed)
-----   Message from Timothy Lasso, RN sent at 01/11/2021 10:42 AM EDT ----- Office visit in 6 months.  A week or so before that she needs CBC, complete metabolic profile, coags and ultrasound of her liver for hepatoma screening.  Thanks

## 2021-07-15 NOTE — Telephone Encounter (Signed)
The patient has been notified of this information and all questions answered.

## 2021-07-23 ENCOUNTER — Ambulatory Visit
Admission: RE | Admit: 2021-07-23 | Discharge: 2021-07-23 | Disposition: A | Discharge status: Home / Self Care | Payer: Medicare Other | Source: Ambulatory Visit | Attending: Gastroenterology | Admitting: Gastroenterology

## 2021-07-23 ENCOUNTER — Other Ambulatory Visit: Payer: Self-pay

## 2021-07-23 DIAGNOSIS — K7031 Alcoholic cirrhosis of liver with ascites: Secondary | ICD-10-CM | POA: Insufficient documentation

## 2021-07-27 ENCOUNTER — Other Ambulatory Visit: Payer: Self-pay

## 2021-07-27 DIAGNOSIS — R932 Abnormal findings on diagnostic imaging of liver and biliary tract: Secondary | ICD-10-CM

## 2021-07-27 DIAGNOSIS — K7031 Alcoholic cirrhosis of liver with ascites: Secondary | ICD-10-CM

## 2021-07-29 ENCOUNTER — Other Ambulatory Visit: Payer: Self-pay | Admitting: Internal Medicine

## 2021-08-02 ENCOUNTER — Telehealth: Payer: Self-pay | Admitting: Gastroenterology

## 2021-08-02 NOTE — Telephone Encounter (Signed)
Patient returned your phone call.  Please call.  Thank you.

## 2021-08-02 NOTE — Telephone Encounter (Signed)
Patient notified of results per Dr Ardis Hughs.  Patient is scheduled for MRI liver on 08-05-21 at 7am arrive at 6:30am, NPO 4 hours prior.  Patient agreed to plan and verbalized understanding.  No further questions.

## 2021-08-05 ENCOUNTER — Other Ambulatory Visit: Payer: Self-pay

## 2021-08-05 ENCOUNTER — Other Ambulatory Visit (INDEPENDENT_AMBULATORY_CARE_PROVIDER_SITE_OTHER): Payer: Medicare Other

## 2021-08-05 ENCOUNTER — Ambulatory Visit (HOSPITAL_COMMUNITY)
Admission: RE | Admit: 2021-08-05 | Discharge: 2021-08-05 | Disposition: A | Discharge status: Home / Self Care | Payer: Medicare Other | Source: Ambulatory Visit | Attending: Physician Assistant | Admitting: Physician Assistant

## 2021-08-05 DIAGNOSIS — R932 Abnormal findings on diagnostic imaging of liver and biliary tract: Secondary | ICD-10-CM

## 2021-08-05 DIAGNOSIS — K7031 Alcoholic cirrhosis of liver with ascites: Secondary | ICD-10-CM

## 2021-08-05 LAB — COMPREHENSIVE METABOLIC PANEL
ALT: 13 U/L (ref 0–35)
AST: 15 U/L (ref 0–37)
Albumin: 4.1 g/dL (ref 3.5–5.2)
Alkaline Phosphatase: 73 U/L (ref 39–117)
BUN: 20 mg/dL (ref 6–23)
CO2: 28 mEq/L (ref 19–32)
Calcium: 9.7 mg/dL (ref 8.4–10.5)
Chloride: 103 mEq/L (ref 96–112)
Creatinine, Ser: 1.01 mg/dL (ref 0.40–1.20)
GFR: 59.03 mL/min — ABNORMAL LOW (ref >60.00)
Glucose, Bld: 102 mg/dL — ABNORMAL HIGH (ref 70–99)
Potassium: 4.2 mEq/L (ref 3.5–5.1)
Sodium: 138 mEq/L (ref 135–145)
Total Bilirubin: 0.5 mg/dL (ref 0.2–1.2)
Total Protein: 7 g/dL (ref 6.0–8.3)

## 2021-08-05 LAB — CBC WITH DIFFERENTIAL/PLATELET
Basophils Absolute: 0 10*3/uL (ref 0.0–0.1)
Basophils Relative: 0.4 % (ref 0.0–3.0)
Eosinophils Absolute: 0.2 10*3/uL (ref 0.0–0.7)
Eosinophils Relative: 2.2 % (ref 0.0–5.0)
HCT: 41.3 % (ref 36.0–46.0)
Hemoglobin: 13.9 g/dL (ref 12.0–15.0)
Lymphocytes Relative: 20.5 % (ref 12.0–46.0)
Lymphs Abs: 1.5 10*3/uL (ref 0.7–4.0)
MCHC: 33.7 g/dL (ref 30.0–36.0)
MCV: 95.9 fl (ref 78.0–100.0)
Monocytes Absolute: 0.7 10*3/uL (ref 0.1–1.0)
Monocytes Relative: 9.5 % (ref 3.0–12.0)
Neutro Abs: 4.8 10*3/uL (ref 1.4–7.7)
Neutrophils Relative %: 67.4 % (ref 43.0–77.0)
Platelets: 167 10*3/uL (ref 150.0–400.0)
RBC: 4.31 Mil/uL (ref 3.87–5.11)
RDW: 13.6 % (ref 11.5–15.5)
WBC: 7.1 10*3/uL (ref 4.0–10.5)

## 2021-08-05 LAB — PROTIME-INR
INR: 1.2 ratio — ABNORMAL HIGH (ref 0.8–1.0)
Prothrombin Time: 12.9 s (ref 9.6–13.1)

## 2021-08-05 MED ORDER — GADOBUTROL 1 MMOL/ML IV SOLN
10.0000 mL | Freq: Once | INTRAVENOUS | Status: AC | PRN
  Administered 2021-08-05: 9 mL via INTRAVENOUS

## 2021-08-06 ENCOUNTER — Encounter: Payer: Self-pay | Admitting: Gastroenterology

## 2021-08-06 ENCOUNTER — Ambulatory Visit: Payer: Medicare Other | Admitting: Gastroenterology

## 2021-08-06 VITALS — BP 114/68 | HR 62 | Ht 62.0 in | Wt 196.0 lb

## 2021-08-06 DIAGNOSIS — K7031 Alcoholic cirrhosis of liver with ascites: Secondary | ICD-10-CM

## 2021-08-06 LAB — AFP TUMOR MARKER: AFP-Tumor Marker: 1.9 ng/mL

## 2021-08-06 NOTE — Progress Notes (Signed)
Review of pertinent gastrointestinal problems: ?1. Cirrhosis.  Alcoholic.  5681 laboratory work-up: Anti-smooth muscle antibody negative, ANA negative, AMA negative, hepatitis A IgM negative, hepatitis C antibody negative, hepatitis B surface antigen negative, ?EGD March 2022: Small distal esophagus varices, mild portal gastropathy.  Next EGD March 2025 ?Ultrasound January 2022: Nodular liver, no focal lesions.  Mild ascites.  Ultrasound 07/08/2021 showed "1.7 x 1.4 hyper echoic lesion in the right lobe of the liver."  This was new.  Liver morphology was normal otherwise.  MRI follow-up 08/2021 showed normal liver contour.  The 1 cm lesion in the right lobe of the liver seem most consistent with a small hemangioma.  Radiologist recommended imaging in 6 months to ensure stability.  She was also found to have 2 cystic lesions in her pancreas with thin septae and normal main pancreatic duct.  The radiologist felt these were probably sidebranch IPMN's and recommended repeat MRI in 12 months ?Meld score; unreliable due to Eliquis use: Blood work April 2022: normal platelets normal total bilirubin: MELD 11 (08/2021 labs) ?Still intermittently drinks alcohol, relatively low volume and infrequently ?  ?2.  Adenomatous colon polyps.  Colonoscopy March 2022 found 2 subcentimeter adenomas.  Repeat colonoscopy at 7-year interval ? ?3.  Incidental cystic lesions of the pancreas noted on MRI 08/2021.  See above ? ? ?HPI: ?This is a very pleasant 66 year old woman whom I last saw several months ago ? ?Her weight is up 4 pounds since her last office visit here last summer ? ?She is back to drinking, only on a very periodic basis, maybe 1 drink or 2/week. ? ?She has had no troubles with abdominal pains, no trouble with edema.  She is taking her Lasix 40 mg once daily and her Aldactone 50 mg once daily quite religiously. ? ? ?ROS: complete GI ROS as described in HPI, all other review negative. ? ?Constitutional:  No unintentional weight  loss ? ? ?Past Medical History:  ?Diagnosis Date  ? Atrial fibrillation ([place])   ? CAD (coronary artery disease) 08/2009  ? BMS to RCA, non-obstructive in CX and LAD 2011  ? CHF (congestive heart failure) ([place])   ? Edema, peripheral   ? Hypertension   ? Nausea   ? Pneumonia   ? January 2020  ? Sleep apnea   ? Trichomoniasis 06/05/2018  ? ? ?Past Surgical History:  ?Procedure Laterality Date  ? BIOPSY  08/06/2020  ? Procedure: BIOPSY;  Surgeon: Milus Banister, MD;  Location: [facility];  Service: Gastroenterology;;  ? CESAREAN SECTION    ? x 1. no BTL  ? COLONOSCOPY WITH PROPOFOL N/A 08/06/2020  ? Procedure: COLONOSCOPY WITH PROPOFOL;  Surgeon: Milus Banister, MD;  Location: [facility];  Service: Gastroenterology;  Laterality: N/A;  ? CORONARY ANGIOPLASTY WITH STENT PLACEMENT    ? DILATION AND CURETTAGE, DIAGNOSTIC / THERAPEUTIC    ? for SAB  ? ESOPHAGOGASTRODUODENOSCOPY (EGD) WITH PROPOFOL N/A 08/06/2020  ? Procedure: ESOPHAGOGASTRODUODENOSCOPY (EGD) WITH PROPOFOL;  Surgeon: Milus Banister, MD;  Location: [facility];  Service: Gastroenterology;  Laterality: N/A;  ? HYSTEROSCOPY WITH D & C N/A 03/13/2019  ? Procedure: DILATATION AND CURETTAGE /HYSTEROSCOPY;  Surgeon: Aletha Halim, MD;  Location: [facility];  Service: Gynecology;  Laterality: N/A;  ? POLYPECTOMY  08/06/2020  ? Procedure: POLYPECTOMY;  Surgeon: Milus Banister, MD;  Location: [facility];  Service: Gastroenterology;;  ? ? ?Current Outpatient Medications  ?Medication Instructions  ? albuterol (PROVENTIL HFA;VENTOLIN HFA) 108 (90 Base)  MCG/ACT inhaler 2 puffs, Inhalation, Every 6 hours PRN  ? apixaban (ELIQUIS) 5 mg, Oral, 2 times daily, Restart early afternoon on 03/14/2019  ? atorvastatin (LIPITOR) 20 mg, Oral, Daily  ? digoxin (LANOXIN) 0.0625 mg, Oral, Daily  ? diltiazem (CARDIZEM CD) 360 mg, Oral, Daily  ? furosemide (LASIX) 40 MG tablet TAKE 1 TABLET BY MOUTH EVERY DAY  ? levocetirizine (XYZAL) 5 MG tablet 1 tablet, Oral,  Daily  ? metoprolol tartrate (LOPRESSOR) 100 mg, Oral, 2 times daily, Take 1 tablet in the morning and 1 tablet in evening.  ? spironolactone (ALDACTONE) 50 mg, Oral, Daily  ? ? ?Allergies as of 08/06/2021 - Review Complete 08/06/2021  ?Allergen Reaction Noted  ? Ace inhibitors Cough 02/04/2016  ? Prednisone Itching and Swelling 07/03/2018  ? ? ?Family History  ?Problem Relation Age of Onset  ? CAD Mother   ? Diabetes Mellitus II Father   ? Hypertension Sister   ? CAD Maternal Grandmother   ? Breast cancer Neg Hx   ? ? ?Social History  ? ?Socioeconomic History  ? Marital status: Married  ?  Spouse name: Not on file  ? Number of children: Not on file  ? Years of education: Not on file  ? Highest education level: Not on file  ?Occupational History  ? Not on file  ?Tobacco Use  ? Smoking status: Former  ?  Packs/day: 0.20  ?  Types: Cigarettes  ? Smokeless tobacco: Never  ?Vaping Use  ? Vaping Use: Never used  ?Substance and Sexual Activity  ? Alcohol use: Yes  ?  Alcohol/week: 5.0 standard drinks  ?  Types: 2 Cans of beer, 3 Standard drinks or equivalent per week  ?  Comment: "drink at least 3 shots a day"  ? Drug use: Yes  ?  Types: Marijuana  ?  Comment: nightly  ? Sexual activity: Yes  ?  Birth control/protection: Post-menopausal  ?Other Topics Concern  ? Not on file  ?Social History Narrative  ? Not on file  ? ?Social Determinants of Health  ? ?Financial Resource Strain: Not on file  ?Food Insecurity: Not on file  ?Transportation Needs: Not on file  ?Physical Activity: Not on file  ?Stress: Not on file  ?Social Connections: Not on file  ?Intimate Partner Violence: Not on file  ? ? ? ?Physical Exam: ?BP 114/68   Pulse 62   Ht 5\' 2"  (1.575 m)   Wt 196 lb (88.9 kg)   BMI 35.85 kg/m?  ?Constitutional: generally well-appearing ?Psychiatric: alert and oriented x3 ?Abdomen: soft, nontender, nondistended, no obvious ascites, no peritoneal signs, normal bowel sounds ?No peripheral edema noted in lower  extremities ? ?Assessment and plan: ?66 y.o. female with chronic liver disease, probably cirrhosis, alcohol related ? ?I did explain to her that I have some question about whether she actually has fibrosis/cirrhosis or not.  The nodularity that was seen in her liver by ultrasound in 2022 has not been replicated on 3 imaging studies since then.  If she does have cirrhosis her MELD score would be 11.  She has normal synthetic function essentially. ? ?She has a small area of her liver that is probably hemangioma and I am going to repeat ultrasound in 6 months per radiologist recommendation.  Around that same time she will get restaging labs for her liver including CBC, complete metabolic profile, coags and she will return to see me in the office. ? ?She has incidental pancreatic cysts and those will require follow-up  in about 1 year from now by MRI. ? ?Please see the "Patient Instructions" section for addition details about the plan. ? ?Owens Loffler, MD ?[facility] ?08/06/2021, 10:46 AM ? ? ?Total time on date of encounter was 25 minutes (this included time spent preparing to see the patient reviewing records; obtaining and/or reviewing separately obtained history; performing a medically appropriate exam and/or evaluation; counseling and educating the patient and family if present; ordering medications, tests or procedures if applicable; and documenting clinical information in the health record). ? ?

## 2021-08-06 NOTE — Patient Instructions (Signed)
If you are age 64 or older, your body mass index should be between 23-30. Your Body mass index is 35.85 kg/m?Marland Kitchen If this is out of the aforementioned range listed, please consider follow up with your Primary Care Provider. ? ?If you are age 40 or younger, your body mass index should be between 19-25. Your Body mass index is 35.85 kg/m?Marland Kitchen If this is out of the aformentioned range listed, please consider follow up with your Primary Care Provider.  ? ?________________________________________________________ ? ?The [facility] providers would like to encourage you to use [facility] to communicate with providers for non-urgent requests or questions.  Due to long hold times on the telephone, sending your provider a message by [facility] may be a faster and more efficient way to get a response.  Please allow 48 business hours for a response.  Please remember that this is for non-urgent requests.  ?_______________________________________________________ ? ?No changes made in your medications today. ? ?You will need a follow up in 6 months (August 2023).  We will contact you to schedule this appointment.  You will need lab work and a liver ultrasound prior to this appointment.   ? ?Thank you for entrusting me with your care and choosing [facility]. ? ?Dr Ardis Hughs ? ?

## 2021-08-21 ENCOUNTER — Other Ambulatory Visit: Payer: Self-pay | Admitting: Internal Medicine

## 2021-09-14 ENCOUNTER — Other Ambulatory Visit: Payer: Self-pay | Admitting: Internal Medicine

## 2021-09-17 ENCOUNTER — Encounter: Payer: Self-pay | Admitting: Internal Medicine

## 2021-09-21 ENCOUNTER — Telehealth: Payer: Self-pay

## 2021-09-21 NOTE — Telephone Encounter (Signed)
Called pt to let her know she can be seen Friday. She was thanked fun to be moved up from August. No further questions at this time.  ?

## 2021-09-24 ENCOUNTER — Telehealth: Payer: Self-pay | Admitting: Internal Medicine

## 2021-09-24 ENCOUNTER — Ambulatory Visit: Payer: Medicare Other | Admitting: Internal Medicine

## 2021-09-24 MED ORDER — APIXABAN 5 MG PO TABS: 5.0000 mg | ORAL_TABLET | Freq: Two times a day (BID) | ORAL | 5 refills | Status: DC

## 2021-09-24 NOTE — Telephone Encounter (Signed)
? ?*  STAT* If patient is at the pharmacy, call can be transferred to refill team. ? ? ?1. Which medications need to be refilled? (please list name of each medication and dose if known)  ? ?digoxin (LANOXIN) 0.125 MG tablet ?apixaban (ELIQUIS) 5 MG TABS tablet ? ?2. Which pharmacy/location (including street and city if local pharmacy) is medication to be sent to? ? ?[id number] [place] [place]? ?3. Do they need a 30 day or 90 day supply? 30  ?

## 2021-09-24 NOTE — Telephone Encounter (Signed)
Prescription refill request for Eliquis received. ?Indication:Afib ?Last office visit:4/22 ?Scr:1.0 ?Age: 66 ?Weight:88.9 kg ? ?Prescription refilled ? ?

## 2021-09-24 NOTE — Progress Notes (Incomplete)
?Cardiology Office Note:   ? ?Date:  09/24/2021  ? ?ID:  Rhonda [name], DOB 1955/10/06, MRN [id number] ? ?PCP:  Mardi Mainland, FNP  ?Cardiologist:  Elouise Munroe, MD  ?Electrophysiologist:  None  ? ?Referring MD: Mardi Mainland,*  ? ?Chief Complaint/Reason for Referral: ?HFpEF, afib. ? ?History of Present Illness:   ? ?Rhonda [name] is a 66 y.o. female with a history of chronic diastolic congestive heart failure, coronary artery disease with prior PCI, hypertension, persistent atrial fibrillation, history of heavy alcohol abuse, hospitalized in January '22 with acute on chronic diastolic congestive heart failure and cirrhosis.  ? ?08/06/20: ?EGD results: ?Small distal esophagus varices without signs of recent bleeding. ?Changes of mild portal gastropathy. ?Findings: ?Unusual duodenum mucosa, ? scalloped. Biopsies for histology were taken with a cold forceps in the ?duodenal bulb and in the first portion of the duodenum for evaluation of celiac disease. ?The exam was otherwise without abnormality. No gastric varices. ?Colonoscopy results: ?Two sessile polyps were found in the transverse colon and ascending colon. The polyps were 4 to 5 mm ?in size. These polyps were removed with a cold snare. Resection and retrieval were complete. ?Findings: ?Multiple small and large-mouthed diverticula were found in the left colon. ?The exam was otherwise without abnormality on direct and retroflexion views. ? ?At her last visit she was doing really well. She noted mild dizziness with low blood pressures. Also complained of some swelling after eating salty foods. Metoprolol was reduced to 100 mg BID. ? ?*** ? ?She denies any palpitations, chest pain, shortness of breath, or peripheral edema. No lightheadedness, headaches, syncope, orthopnea, or PND. ? ?(+) ? ?Past Medical History:  ?Diagnosis Date  ? Atrial fibrillation ([place])   ? CAD (coronary artery disease) 08/2009  ? BMS to RCA, non-obstructive  in CX and LAD 2011  ? CHF (congestive heart failure) ([place])   ? Edema, peripheral   ? Hypertension   ? Nausea   ? Pneumonia   ? January 2020  ? Sleep apnea   ? Trichomoniasis 06/05/2018  ? ? ?Past Surgical History:  ?Procedure Laterality Date  ? BIOPSY  08/06/2020  ? Procedure: BIOPSY;  Surgeon: Milus Banister, MD;  Location: [facility];  Service: Gastroenterology;;  ? CESAREAN SECTION    ? x 1. no BTL  ? COLONOSCOPY WITH PROPOFOL N/A 08/06/2020  ? Procedure: COLONOSCOPY WITH PROPOFOL;  Surgeon: Milus Banister, MD;  Location: [facility];  Service: Gastroenterology;  Laterality: N/A;  ? CORONARY ANGIOPLASTY WITH STENT PLACEMENT    ? DILATION AND CURETTAGE, DIAGNOSTIC / THERAPEUTIC    ? for SAB  ? ESOPHAGOGASTRODUODENOSCOPY (EGD) WITH PROPOFOL N/A 08/06/2020  ? Procedure: ESOPHAGOGASTRODUODENOSCOPY (EGD) WITH PROPOFOL;  Surgeon: Milus Banister, MD;  Location: [facility];  Service: Gastroenterology;  Laterality: N/A;  ? HYSTEROSCOPY WITH D & C N/A 03/13/2019  ? Procedure: DILATATION AND CURETTAGE /HYSTEROSCOPY;  Surgeon: Aletha Halim, MD;  Location: [place];  Service: Gynecology;  Laterality: N/A;  ? POLYPECTOMY  08/06/2020  ? Procedure: POLYPECTOMY;  Surgeon: Milus Banister, MD;  Location: [facility];  Service: Gastroenterology;;  ? ? ?Current Medications: ?No outpatient medications have been marked as taking for the 09/24/21 encounter (Appointment) with Elouise Munroe, MD.  ?  ? ?Allergies:   Ace inhibitors and Prednisone  ? ?Social History  ? ?Tobacco Use  ? Smoking status: Former  ?  Packs/day: 0.20  ?  Types: Cigarettes  ? Smokeless tobacco: Never  ?  Vaping Use  ? Vaping Use: Never used  ?Substance Use Topics  ? Alcohol use: Yes  ?  Alcohol/week: 5.0 standard drinks  ?  Types: 2 Cans of beer, 3 Standard drinks or equivalent per week  ?  Comment: "drink at least 3 shots a day"  ? Drug use: Yes  ?  Types: Marijuana  ?  Comment: nightly  ?  ? ?Family History: ?The patient's family  history includes CAD in her maternal grandmother and mother; Diabetes Mellitus II in her father; Hypertension in her sister. There is no history of Breast cancer. ? ?ROS:   ?Please see the history of present illness.    ? ?All other systems reviewed and are negative. ? ?EKGs/Labs/Other Studies Reviewed:   ? ?The following studies were reviewed today: ? ?Echo Limited 06/17/2020: ? 1. Left ventricular ejection fraction, by estimation, is 50 to 55%. The  ?left ventricle has low normal function. The left ventricle has no regional  ?wall motion abnormalities. There is mild left ventricular hypertrophy.  ? 2. Right ventricular systolic function is moderately reduced. The right  ?ventricular size is moderately enlarged. There is moderately elevated  ?pulmonary artery systolic pressure.  ? 3. Left atrial size was severely dilated.  ? 4. Right atrial size was severely dilated.  ? 5. Eccentric posteriorly directed MR likely related to restricted  ?posterior leaflet motion and atrial enlargement . The mitral valve is  ?abnormal. Moderate mitral valve regurgitation. No evidence of mitral  ?stenosis.  ? 6. Tricuspid valve regurgitation is severe.  ? 7. The aortic valve is normal in structure. Aortic valve regurgitation is  ?not visualized. Mild aortic valve sclerosis is present, with no evidence  ?of aortic valve stenosis.  ? 8. The inferior vena cava is dilated in size with <50% respiratory  ?variability, suggesting right atrial pressure of 15 mmHg.  ? ?Echo Complete 01/03/2020: ? 1. Inferior basal hypokinesis. Left ventricular ejection fraction, by  ?estimation, is 50 to 55%. The left ventricle has low normal function. The  ?left ventricle demonstrates regional wall motion abnormalities (see  ?scoring diagram/findings for  ?description). The left ventricular internal cavity size was mildly  ?dilated. Left ventricular diastolic parameters are indeterminate.  ? 2. Right ventricular systolic function is normal. The right  ventricular  ?size is normal. There is moderately elevated pulmonary artery systolic  ?pressure.  ? 3. Left atrial size was severely dilated.  ? 4. Right atrial size was moderately dilated.  ? 5. MR eccentric posterior ? from atrial enlargement and type 3A  ?restricted posterior leaflet motion. Consider TEE for further assess  ?severity if clinically indicated . The mitral valve is normal in  ?structure. Moderate mitral valve regurgitation.  ?Moderate to severe mitral stenosis.  ? 6. Tricuspid valve regurgitation is moderate.  ? 7. The aortic valve is normal in structure. Aortic valve regurgitation is  ?not visualized. No aortic stenosis is present.  ? 8. The inferior vena cava is normal in size with greater than 50%  ?respiratory variability, suggesting right atrial pressure of 3 mmHg.  ? ?Comparison(s): 04/03/18 EF 50-55%.  ? ? ?EKG:  EKG is personally reviewed. ?09/24/2021: Sinus ***. Rate *** bpm. ?09/29/2020: Afib rate 62 bpm. ? ?I have independently reviewed the images and report from EGD/colonoscopy 08/06/20.  ? ?Recent Labs: ?08/05/2021: ALT 13; BUN 20; Creatinine, Ser 1.01; Hemoglobin 13.9; Platelets 167.0; Potassium 4.2; Sodium 138  ?Recent Lipid Panel ?No results found for: CHOL, TRIG, HDL, CHOLHDL, VLDL, LDLCALC, LDLDIRECT ? ?Physical Exam:   ? ?  VS:  There were no vitals taken for this visit.   ? ?Wt Readings from Last 5 Encounters:  ?08/06/21 196 lb (88.9 kg)  ?12/23/20 192 lb (87.1 kg)  ?09/30/20 176 lb 12.8 oz (80.2 kg)  ?09/29/20 175 lb 9.6 oz (79.7 kg)  ?07/31/20 193 lb (87.5 kg)  ?  ?Constitutional: No acute distress ?Eyes: sclera non-icteric, normal conjunctiva and lids ?ENMT: normal dentition, moist mucous membranes ?Cardiovascular: ***irregular rhythm, normal rate, no murmurs. S1 and S2 normal. Radial pulses normal bilaterally. No jugular venous distention.  ?Respiratory: clear to auscultation bilaterally ?GI : normal bowel sounds, soft and nontender. No distention.   ?MSK: extremities warm, well  perfused. ***1+ edema.  ?NEURO: grossly nonfocal exam, moves all extremities. ?PSYCH: alert and oriented x 3, normal mood and affect.  ? ?ASSESSMENT:   ? ?No diagnosis found. ? ?PLAN:   ? ?Permanent AF ?- Eliquis 5 mg BI

## 2021-09-25 ENCOUNTER — Other Ambulatory Visit: Payer: Self-pay | Admitting: Gastroenterology

## 2021-09-28 MED ORDER — DIGOXIN 125 MCG PO TABS: 0.0625 mg | ORAL_TABLET | Freq: Every day | ORAL | 0 refills | Status: DC

## 2021-09-28 MED ORDER — ATORVASTATIN CALCIUM 20 MG PO TABS: 20.0000 mg | ORAL_TABLET | Freq: Every evening | ORAL | 0 refills | Status: DC

## 2021-09-28 MED ORDER — SPIRONOLACTONE 50 MG PO TABS: 50.0000 mg | ORAL_TABLET | Freq: Every day | ORAL | 0 refills | Status: DC

## 2021-09-28 MED ORDER — DILTIAZEM HCL ER COATED BEADS 360 MG PO CP24: 360.0000 mg | ORAL_CAPSULE | Freq: Every day | ORAL | 0 refills | Status: DC

## 2021-09-28 MED ORDER — METOPROLOL TARTRATE 100 MG PO TABS: 100.0000 mg | ORAL_TABLET | Freq: Two times a day (BID) | ORAL | 0 refills | Status: DC

## 2021-09-28 NOTE — Addendum Note (Signed)
Addended by: Fidel Levy on: 09/28/2021 01:35 PM ? ? Modules accepted: Orders ? ?

## 2021-09-29 ENCOUNTER — Other Ambulatory Visit: Payer: Self-pay | Admitting: Nurse Practitioner

## 2021-09-29 DIAGNOSIS — Z1231 Encounter for screening mammogram for malignant neoplasm of breast: Secondary | ICD-10-CM

## 2021-10-18 ENCOUNTER — Encounter: Payer: Self-pay | Admitting: Internal Medicine

## 2021-10-18 ENCOUNTER — Ambulatory Visit: Payer: Medicare Other | Admitting: Internal Medicine

## 2021-10-18 VITALS — BP 110/70 | HR 87 | Ht 62.5 in | Wt 191.0 lb

## 2021-10-18 DIAGNOSIS — D6869 Other thrombophilia: Secondary | ICD-10-CM

## 2021-10-18 DIAGNOSIS — K7031 Alcoholic cirrhosis of liver with ascites: Secondary | ICD-10-CM

## 2021-10-18 DIAGNOSIS — Z5181 Encounter for therapeutic drug level monitoring: Secondary | ICD-10-CM | POA: Diagnosis not present

## 2021-10-18 DIAGNOSIS — Z79899 Other long term (current) drug therapy: Secondary | ICD-10-CM

## 2021-10-18 DIAGNOSIS — I4821 Permanent atrial fibrillation: Secondary | ICD-10-CM

## 2021-10-18 DIAGNOSIS — I2510 Atherosclerotic heart disease of native coronary artery without angina pectoris: Secondary | ICD-10-CM

## 2021-10-18 DIAGNOSIS — I4811 Longstanding persistent atrial fibrillation: Secondary | ICD-10-CM

## 2021-10-18 DIAGNOSIS — I10 Essential (primary) hypertension: Secondary | ICD-10-CM

## 2021-10-18 DIAGNOSIS — I5032 Chronic diastolic (congestive) heart failure: Secondary | ICD-10-CM

## 2021-10-18 DIAGNOSIS — D649 Anemia, unspecified: Secondary | ICD-10-CM

## 2021-10-18 DIAGNOSIS — I340 Nonrheumatic mitral (valve) insufficiency: Secondary | ICD-10-CM

## 2021-10-18 DIAGNOSIS — G4733 Obstructive sleep apnea (adult) (pediatric): Secondary | ICD-10-CM

## 2021-10-18 DIAGNOSIS — R600 Localized edema: Secondary | ICD-10-CM

## 2021-10-18 NOTE — Progress Notes (Signed)
Cardiology Office Note:    Date:  10/18/2021   ID:  Rhonda [name], Rhonda [name] Nov 02, 1955, MRN [id number]  PCP:  Mardi Mainland, [place]  Cardiologist:  Elouise Munroe, MD  Electrophysiologist:  None   Referring MD: Mardi Mainland   Chief Complaint/Reason for Referral: HFpEF, afib.  History of Present Illness:    Rhonda [name] is a 66 y.o. female with a history of chronic diastolic congestive heart failure, coronary artery disease with prior PCI, hypertension, persistent atrial fibrillation, history of heavy alcohol abuse recently hospitalized in January '22 with acute on chronic diastolic congestive heart failure and cirrhosis.   At her last appointment she was doing really well. She reported mild dizziness when her BP is low. She also noted more swelling after eating some dill pickle chips.  08/06/20: EGD results: Small distal esophagus varices without signs of recent bleeding. Changes of mild portal gastropathy. Findings: Unusual duodenum mucosa, ? scalloped. Biopsies for histology were taken with a cold forceps in the duodenal bulb and in the first portion of the duodenum for evaluation of celiac disease. The exam was otherwise without abnormality. No gastric varices. Colonoscopy results: Two sessile polyps were found in the transverse colon and ascending colon. The polyps were 4 to 5 mm in size. These polyps were removed with a cold snare. Resection and retrieval were complete. Findings: Multiple small and large-mouthed diverticula were found in the left colon. The exam was otherwise without abnormality on direct and retroflexion views.  Today: She is accompanied by her husband and is feeling well. Lately she denies any issues with significant LE edema, though she does have chronic pedal edema.  She remains compliant with digoxin. Currently taking 0.0625 mg daily 6 times a week. She has been doing well on digoxin and denies any chest pain or  palpitations.  Of note, she is able to climb up a flight of stairs without significant symptoms.  She denies any shortness of breath. No lightheadedness, headaches, syncope, orthopnea, or PND.   Past Medical History:  Diagnosis Date   Atrial fibrillation ([place])    CAD (coronary artery disease) 08/2009   BMS to RCA, non-obstructive in CX and LAD 2011   CHF (congestive heart failure) ([facility])    Edema, peripheral    Hypertension    Nausea    Pneumonia    January 2020   Sleep apnea    Trichomoniasis 06/05/2018    Past Surgical History:  Procedure Laterality Date   BIOPSY  08/06/2020   Procedure: BIOPSY;  Surgeon: Milus Banister, MD;  Location: [facility];  Service: Gastroenterology;;   CESAREAN SECTION     x 1. no BTL   COLONOSCOPY WITH PROPOFOL N/A 08/06/2020   Procedure: COLONOSCOPY WITH PROPOFOL;  Surgeon: Milus Banister, MD;  Location: [facility];  Service: Gastroenterology;  Laterality: N/A;   CORONARY ANGIOPLASTY WITH STENT PLACEMENT     DILATION AND CURETTAGE, DIAGNOSTIC / THERAPEUTIC     for SAB   ESOPHAGOGASTRODUODENOSCOPY (EGD) WITH PROPOFOL N/A 08/06/2020   Procedure: ESOPHAGOGASTRODUODENOSCOPY (EGD) WITH PROPOFOL;  Surgeon: Milus Banister, MD;  Location: [facility];  Service: Gastroenterology;  Laterality: N/A;   HYSTEROSCOPY WITH D & C N/A 03/13/2019   Procedure: DILATATION AND CURETTAGE /HYSTEROSCOPY;  Surgeon: Aletha Halim, MD;  Location: [place];  Service: Gynecology;  Laterality: N/A;   POLYPECTOMY  08/06/2020   Procedure: POLYPECTOMY;  Surgeon: Milus Banister, MD;  Location: [facility];  Service: Gastroenterology;;  Current Medications: Current Meds  Medication Sig   albuterol (PROVENTIL HFA;VENTOLIN HFA) 108 (90 Base) MCG/ACT inhaler Inhale 2 puffs into the lungs every 6 (six) hours as needed for wheezing or shortness of breath.   apixaban (ELIQUIS) 5 MG TABS tablet Take 1 tablet (5 mg total) by mouth 2 (two) times daily.    atorvastatin (LIPITOR) 20 MG tablet Take 1 tablet (20 mg total) by mouth every evening.   digoxin (LANOXIN) 0.125 MG tablet Take 0.5 tablets (0.0625 mg total) by mouth daily.   diltiazem (CARDIZEM CD) 360 MG 24 hr capsule Take 1 capsule (360 mg total) by mouth daily.   furosemide (LASIX) 40 MG tablet TAKE 1 TABLET BY MOUTH EVERY DAY   levocetirizine (XYZAL) 5 MG tablet Take 1 tablet by mouth daily.   metoprolol tartrate (LOPRESSOR) 100 MG tablet Take 1 tablet (100 mg total) by mouth 2 (two) times daily.   spironolactone (ALDACTONE) 50 MG tablet Take 1 tablet (50 mg total) by mouth daily.     Allergies:   Ace inhibitors and Prednisone   Social History   Tobacco Use   Smoking status: Former    Packs/day: 0.20    Types: Cigarettes   Smokeless tobacco: Never  Vaping Use   Vaping Use: Never used  Substance Use Topics   Alcohol use: Yes    Alcohol/week: 5.0 standard drinks    Types: 2 Cans of beer, 3 Standard drinks or equivalent per week    Comment: "drink at least 3 shots a day"   Drug use: Yes    Types: Marijuana    Comment: nightly     Family History: The patient's family history includes CAD in her maternal grandmother and mother; Diabetes Mellitus II in her father; Hypertension in her sister. There is no history of Breast cancer.  ROS:   Please see the history of present illness.    All other systems reviewed and are negative.  EKGs/Labs/Other Studies Reviewed:    The following studies were reviewed today:  Echo Limited 06/17/2020:  1. Left ventricular ejection fraction, by estimation, is 50 to 55%. The  left ventricle has low normal function. The left ventricle has no regional  wall motion abnormalities. There is mild left ventricular hypertrophy.   2. Right ventricular systolic function is moderately reduced. The right  ventricular size is moderately enlarged. There is moderately elevated  pulmonary artery systolic pressure.   3. Left atrial size was severely dilated.    4. Right atrial size was severely dilated.   5. Eccentric posteriorly directed MR likely related to restricted  posterior leaflet motion and atrial enlargement . The mitral valve is  abnormal. Moderate mitral valve regurgitation. No evidence of mitral  stenosis.   6. Tricuspid valve regurgitation is severe.   7. The aortic valve is normal in structure. Aortic valve regurgitation is  not visualized. Mild aortic valve sclerosis is present, with no evidence  of aortic valve stenosis.   8. The inferior vena cava is dilated in size with <50% respiratory  variability, suggesting right atrial pressure of 15 mmHg.   Echo 01/03/2020:  1. Inferior basal hypokinesis. Left ventricular ejection fraction, by  estimation, is 50 to 55%. The left ventricle has low normal function. The  left ventricle demonstrates regional wall motion abnormalities (see  scoring diagram/findings for  description). The left ventricular internal cavity size was mildly  dilated. Left ventricular diastolic parameters are indeterminate.   2. Right ventricular systolic function is normal. The right ventricular  size is normal. There is moderately elevated pulmonary artery systolic  pressure.   3. Left atrial size was severely dilated.   4. Right atrial size was moderately dilated.   5. MR eccentric posterior ? from atrial enlargement and type 3A  restricted posterior leaflet motion. Consider TEE for further assess  severity if clinically indicated . The mitral valve is normal in  structure. Moderate mitral valve regurgitation.  Moderate to severe mitral stenosis.   6. Tricuspid valve regurgitation is moderate.   7. The aortic valve is normal in structure. Aortic valve regurgitation is  not visualized. No aortic stenosis is present.   8. The inferior vena cava is normal in size with greater than 50%  respiratory variability, suggesting right atrial pressure of 3 mmHg.   Comparison(s): 04/03/18 EF 50-55%.    EKG:  EKG  is personally reviewed. 10/18/2021: Atrial fibrillation. Rate 87 bpm. 09/29/2020: Afib rate 62 bpm  I have independently reviewed the images and report from EGD/colonoscopy 08/06/20.   Recent Labs: 08/05/2021: ALT 13; BUN 20; Creatinine, Ser 1.01; Hemoglobin 13.9; Platelets 167.0; Potassium 4.2; Sodium 138   Recent Lipid Panel No results found for: CHOL, TRIG, HDL, CHOLHDL, VLDL, LDLCALC, LDLDIRECT  Physical Exam:    VS:  BP 110/70   Pulse 87   Ht 5' 2.5" (1.588 m)   Wt 191 lb (86.6 kg)   SpO2 99%   BMI 34.38 kg/m     Wt Readings from Last 5 Encounters:  10/18/21 191 lb (86.6 kg)  08/06/21 196 lb (88.9 kg)  12/23/20 192 lb (87.1 kg)  09/30/20 176 lb 12.8 oz (80.2 kg)  09/29/20 175 lb 9.6 oz (79.7 kg)    Constitutional: No acute distress Eyes: sclera non-icteric, normal conjunctiva and lids ENMT: normal dentition, moist mucous membranes Cardiovascular: irregular rhythm, normal rate, no murmurs. S1 and S2 normal. No jugular venous distention.  Respiratory: clear to auscultation bilaterally GI : normal bowel sounds, soft and nontender. No distention.   MSK: extremities warm, well perfused. 1+ edema.  NEURO: grossly nonfocal exam, moves all extremities. PSYCH: alert and oriented x 3, normal mood and affect.   ASSESSMENT:    1. Permanent atrial fibrillation ([place])   2. Therapeutic drug monitoring   3. Secondary hypercoagulable state ([place])   4. Medication management   5. Chronic diastolic HF (heart failure) ([place])   6. Ascites due to alcoholic cirrhosis ([place])   7. Lower extremity edema   8. Essential hypertension   9. OSA (obstructive sleep apnea)   10. Coronary artery disease involving native coronary artery of native heart without angina pectoris   11. Anemia, unspecified type   12. Nonrheumatic mitral valve regurgitation     PLAN:    Permanent AF Secondary hypercoagulable state Therapeutic drug monitoring Medication management - Eliquis 5 mg BID continue. - continue  rate controlling agents with metoprolol tartrate 100 mg BID, diltiazem 360 mg daily, digoxin 0.0625 mg daily 6 times a week. Rates well controlled. - very overdue for digoxin level follow up despite many attempts to coordinate from our office. Will check today.   HTN - as above, metoprolol to 100 mg BID. May need to decrease dose of diltiazem if hypotensive however BP stable today and she feels well. Could also consider switching metoprolol to nadolol however last scope showed no evidence of varices.   Chronic diastolic HF - overall stable, waxing and waning edema. By request of GI, diuretics managed by Dr. Ardis Hughs. Continue spironolactone 50 mg daily and  furosemide 40 mg daily.   Follow-up:  1 year.  Total time of encounter: 30 minutes total time of encounter, including 20 minutes spent in face-to-face patient care on the date of this encounter. This time includes coordination of care and counseling regarding above mentioned problem list. Remainder of non-face-to-face time involved reviewing chart documents/testing relevant to the patient encounter and documentation in the medical record. I have independently reviewed documentation from referring provider.   Cherlynn Kaiser, MD, [facility]     Medication Adjustments/Labs and Tests Ordered: Current medicines are reviewed at length with the patient today.  Concerns regarding medicines are outlined above.   Orders Placed This Encounter  Procedures   Digoxin level   EKG 12-Lead    No orders of the defined types were placed in this encounter.   Patient Instructions  Medication Instructions:  No Changes In Medications at this time.  *If you need a refill on your cardiac medications before your next appointment, please call your pharmacy*  Lab Work: DIG Level today  If you have labs (blood work) drawn today and your tests are completely normal, you will receive your results only by: [place] (if you have  MyChart) OR A paper copy in the mail If you have any lab test that is abnormal or we need to change your treatment, we will call you to review the results.  Follow-Up: At [facility], you and your health needs are our priority.  As part of our continuing mission to provide you with exceptional heart care, we have created designated Provider Care Teams.  These Care Teams include your primary Cardiologist (physician) and Advanced Practice Providers (APPs -  Physician Assistants and Nurse Practitioners) who all work together to provide you with the care you need, when you need it.  Your next appointment:   1 year(s)  The format for your next appointment:   In Person  Provider:   Elouise Munroe, MD          [facility],acting as a scribe for Elouise Munroe, MD.,have documented all relevant documentation on the behalf of Elouise Munroe, MD,as directed by  Elouise Munroe, MD while in the presence of Elouise Munroe, MD.  I, Elouise Munroe, MD, have reviewed all documentation for the visit on 10/18/2021. The documentation on today's date of service for the exam, diagnosis, procedures, and orders are all accurate and complete.

## 2021-10-18 NOTE — Patient Instructions (Signed)
Medication Instructions:  ?No Changes In Medications at this time.  ?*If you need a refill on your cardiac medications before your next appointment, please call your pharmacy* ? ?Lab Work: ?DIG Level today  ?If you have labs (blood work) drawn today and your tests are completely normal, you will receive your results only by: ?MyChart Message (if you have MyChart) OR ?A paper copy in the mail ?If you have any lab test that is abnormal or we need to change your treatment, we will call you to review the results. ? ?Follow-Up: ?At [facility], you and your health needs are our priority.  As part of our continuing mission to provide you with exceptional heart care, we have created designated Provider Care Teams.  These Care Teams include your primary Cardiologist (physician) and Advanced Practice Providers (APPs -  Physician Assistants and Nurse Practitioners) who all work together to provide you with the care you need, when you need it. ? ?Your next appointment:   ?1 year(s) ? ?The format for your next appointment:   ?In Person ? ?Provider:   ?Elouise Munroe, MD  ? ? ? ? ?  ?

## 2021-10-19 LAB — DIGOXIN LEVEL: Digoxin, Serum: <0.4 ng/mL — ABNORMAL LOW (ref 0.5–0.9)

## 2021-10-20 ENCOUNTER — Encounter: Payer: Self-pay | Admitting: *Deleted

## 2021-10-22 ENCOUNTER — Ambulatory Visit: Payer: Medicare Other | Admitting: Internal Medicine

## 2021-10-25 ENCOUNTER — Other Ambulatory Visit: Payer: Self-pay | Admitting: Internal Medicine

## 2021-10-29 ENCOUNTER — Ambulatory Visit
Admission: RE | Admit: 2021-10-29 | Discharge: 2021-10-29 | Disposition: A | Discharge status: Home / Self Care | Payer: Medicare Other | Source: Ambulatory Visit | Attending: Nurse Practitioner | Admitting: Nurse Practitioner

## 2021-10-29 DIAGNOSIS — Z1231 Encounter for screening mammogram for malignant neoplasm of breast: Secondary | ICD-10-CM | POA: Insufficient documentation

## 2021-11-02 ENCOUNTER — Encounter: Payer: Self-pay | Admitting: Internal Medicine

## 2021-12-06 ENCOUNTER — Telehealth: Payer: Self-pay

## 2021-12-06 NOTE — Telephone Encounter (Signed)
-----   Message from Stevan Born, [place] sent at 08/06/2021 11:25 AM EST ----- Regarding: 6 month Needs 6 month appt with DJ in August; needs labs prior to appt CBC, CMET, INR and LIVER U/S dx Cirr

## 2021-12-06 NOTE — Telephone Encounter (Signed)
Left message for patient to return call to further discuss 6 month follow up appointment with labs and ultrasound prior.  Will continue efforts.

## 2021-12-09 ENCOUNTER — Other Ambulatory Visit: Payer: Self-pay

## 2021-12-09 DIAGNOSIS — R932 Abnormal findings on diagnostic imaging of liver and biliary tract: Secondary | ICD-10-CM

## 2021-12-09 DIAGNOSIS — K7031 Alcoholic cirrhosis of liver with ascites: Secondary | ICD-10-CM

## 2021-12-09 NOTE — Telephone Encounter (Signed)
Patient aware that she is scheduled for RUQ abd ultrasound on 01-27-22 at 9am and for a follow up with Dr Ardis Hughs on 02-15-22 at 10:50am.  Patient agreed to plan and verbalized understanding.  No further questions.

## 2021-12-28 ENCOUNTER — Other Ambulatory Visit: Payer: Self-pay

## 2021-12-28 DIAGNOSIS — I5033 Acute on chronic diastolic (congestive) heart failure: Secondary | ICD-10-CM

## 2021-12-28 DIAGNOSIS — I4821 Permanent atrial fibrillation: Secondary | ICD-10-CM

## 2021-12-29 MED ORDER — SPIRONOLACTONE 50 MG PO TABS: 50.0000 mg | ORAL_TABLET | Freq: Every day | ORAL | 0 refills | Status: DC

## 2021-12-29 MED ORDER — FUROSEMIDE 40 MG PO TABS: 40.0000 mg | ORAL_TABLET | Freq: Every day | ORAL | 1 refills | Status: DC

## 2021-12-29 MED ORDER — APIXABAN 5 MG PO TABS: 5.0000 mg | ORAL_TABLET | Freq: Two times a day (BID) | ORAL | 5 refills | Status: DC

## 2021-12-29 MED ORDER — DIGOXIN 125 MCG PO TABS: 0.0625 mg | ORAL_TABLET | Freq: Every day | ORAL | 0 refills | Status: DC

## 2021-12-29 MED ORDER — DILTIAZEM HCL ER COATED BEADS 360 MG PO CP24: 360.0000 mg | ORAL_CAPSULE | Freq: Every day | ORAL | 0 refills | Status: DC

## 2022-01-14 ENCOUNTER — Ambulatory Visit: Payer: Medicare Other | Admitting: Internal Medicine

## 2022-01-17 ENCOUNTER — Ambulatory Visit (HOSPITAL_COMMUNITY): Payer: Medicare HMO

## 2022-01-26 ENCOUNTER — Ambulatory Visit
Admission: RE | Admit: 2022-01-26 | Discharge: 2022-01-26 | Disposition: A | Discharge status: Home / Self Care | Payer: Medicare HMO | Source: Ambulatory Visit | Attending: Gastroenterology | Admitting: Gastroenterology

## 2022-01-26 ENCOUNTER — Encounter (HOSPITAL_COMMUNITY): Payer: Self-pay

## 2022-01-26 ENCOUNTER — Other Ambulatory Visit (HOSPITAL_COMMUNITY): Payer: Self-pay

## 2022-01-26 DIAGNOSIS — K7689 Other specified diseases of liver: Secondary | ICD-10-CM | POA: Diagnosis not present

## 2022-01-26 DIAGNOSIS — K7031 Alcoholic cirrhosis of liver with ascites: Secondary | ICD-10-CM | POA: Insufficient documentation

## 2022-01-26 DIAGNOSIS — K769 Liver disease, unspecified: Secondary | ICD-10-CM | POA: Diagnosis not present

## 2022-01-26 DIAGNOSIS — K7030 Alcoholic cirrhosis of liver without ascites: Secondary | ICD-10-CM | POA: Diagnosis not present

## 2022-01-26 DIAGNOSIS — R932 Abnormal findings on diagnostic imaging of liver and biliary tract: Secondary | ICD-10-CM | POA: Insufficient documentation

## 2022-01-26 DIAGNOSIS — K7460 Unspecified cirrhosis of liver: Secondary | ICD-10-CM | POA: Diagnosis not present

## 2022-02-15 ENCOUNTER — Ambulatory Visit: Payer: Medicare Other | Admitting: Gastroenterology

## 2022-03-02 ENCOUNTER — Encounter: Payer: Self-pay | Admitting: Nurse Practitioner

## 2022-03-02 ENCOUNTER — Other Ambulatory Visit (INDEPENDENT_AMBULATORY_CARE_PROVIDER_SITE_OTHER): Payer: Medicare HMO

## 2022-03-02 ENCOUNTER — Ambulatory Visit: Payer: Medicare HMO | Admitting: Nurse Practitioner

## 2022-03-02 VITALS — BP 110/92 | HR 105 | Ht 62.5 in | Wt 191.0 lb

## 2022-03-02 DIAGNOSIS — K7031 Alcoholic cirrhosis of liver with ascites: Secondary | ICD-10-CM

## 2022-03-02 DIAGNOSIS — I8510 Secondary esophageal varices without bleeding: Secondary | ICD-10-CM | POA: Diagnosis not present

## 2022-03-02 LAB — CBC WITH DIFFERENTIAL/PLATELET
Basophils Absolute: 0.1 10*3/uL (ref 0.0–0.1)
Basophils Relative: 0.8 % (ref 0.0–3.0)
Eosinophils Absolute: 0.1 10*3/uL (ref 0.0–0.7)
Eosinophils Relative: 1.5 % (ref 0.0–5.0)
HCT: 43.7 % (ref 36.0–46.0)
Hemoglobin: 14.9 g/dL (ref 12.0–15.0)
Lymphocytes Relative: 28.6 % (ref 12.0–46.0)
Lymphs Abs: 2 10*3/uL (ref 0.7–4.0)
MCHC: 34 g/dL (ref 30.0–36.0)
MCV: 96.3 fl (ref 78.0–100.0)
Monocytes Absolute: 0.7 10*3/uL (ref 0.1–1.0)
Monocytes Relative: 10.4 % (ref 3.0–12.0)
Neutro Abs: 4 10*3/uL (ref 1.4–7.7)
Neutrophils Relative %: 58.7 % (ref 43.0–77.0)
Platelets: 178 10*3/uL (ref 150.0–400.0)
RBC: 4.54 Mil/uL (ref 3.87–5.11)
RDW: 13.7 % (ref 11.5–15.5)
WBC: 6.9 10*3/uL (ref 4.0–10.5)

## 2022-03-02 LAB — COMPREHENSIVE METABOLIC PANEL
ALT: 13 U/L (ref 0–35)
AST: 14 U/L (ref 0–37)
Albumin: 4.3 g/dL (ref 3.5–5.2)
Alkaline Phosphatase: 77 U/L (ref 39–117)
BUN: 14 mg/dL (ref 6–23)
CO2: 33 mEq/L — ABNORMAL HIGH (ref 19–32)
Calcium: 10.1 mg/dL (ref 8.4–10.5)
Chloride: 100 mEq/L (ref 96–112)
Creatinine, Ser: 0.94 mg/dL (ref 0.40–1.20)
GFR: 64.08 mL/min (ref >60.00)
Glucose, Bld: 102 mg/dL — ABNORMAL HIGH (ref 70–99)
Potassium: 4.3 mEq/L (ref 3.5–5.1)
Sodium: 140 mEq/L (ref 135–145)
Total Bilirubin: 0.6 mg/dL (ref 0.2–1.2)
Total Protein: 7.2 g/dL (ref 6.0–8.3)

## 2022-03-02 LAB — PROTIME-INR
INR: 1.3 ratio — ABNORMAL HIGH (ref 0.8–1.0)
Prothrombin Time: 14 s — ABNORMAL HIGH (ref 9.6–13.1)

## 2022-03-02 NOTE — Progress Notes (Signed)
03/02/2022 Meiko Ives [id number] 1955-10-22   CHIEF COMPLAINT: Cirrhosis follow-up  HISTORY OF PRESENT ILLNESS: Olin Hauser L. [name] is a 66 year old female with past medical history of sleep apnea, hypertension, coronary artery disease s/p RCA sent placement 2011, CHF, severe TR, moderate MR, atrial fibrillation on Eliquis, sleep apnea, left adrenal adenoma, alcohol use disorder, alcohol associated cirrhosis and colon polyps.  She is followed by Dr. Ardis Hughs.  She presents today for cirrhosis follow-up. She denies having any nausea or vomiting. No dysphagia or heartburn. No upper or lower abdominal pain.  No ascites.  Her ankles and feet get a little puffy at nighttime but no significant leg swelling.  She is passing normal formed brown bowel movement daily.  No rectal bleeding or black stools.  She eats a low sodium diet. She drinks one pint of liquor in 24 hours or a few spiked lemonade drinks every other weeks. She some times goes 3 weeks without consuming any alcohol.  No issues with confusion.  Her husband died suddenly while they were at the beach this summer.  He is tearful and continues to grieve the loss of her husband.  Otherwise, she reports feeling well.  She remains quite active.  She underwent a surveillance RUQ sonogram 01/26/2022 without evidence of any concerning liver lesions or hepatoma.  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  Her most recent EGD and colonoscopy were 08/06/2020.  The EGD identified small distal esophageal varices without evidence of recent bleeding and mild portal gastropathy.  The colonoscopy identified 2 tubular adenomatous/hyperplastic polyps removed from the transverse and ascending colon and diverticulosis to the left colon.  A repeat colonoscopy in 7 years was recommended.     Latest Ref Rng & Units 03/02/2022    2:46 PM 08/05/2021    8:01 AM 10/08/2020    3:40 PM  CBC  WBC 4.0 - 10.5 K/uL 6.9  7.1  8.2    Hemoglobin 12.0 - 15.0 g/dL 14.9  13.9  12.1   Hematocrit 36.0 - 46.0 % 43.7  41.3  37.4   Platelets 150.0 - 400.0 K/uL 178.0  167.0  195        Latest Ref Rng & Units 03/02/2022    2:46 PM 08/05/2021    8:01 AM 12/17/2020   12:08 PM  CMP  Glucose 70 - 99 mg/dL 102  102  96   BUN 6 - 23 mg/dL '14  20  22   '$ Creatinine 0.40 - 1.20 mg/dL 0.94  1.01  1.20   Sodium 135 - 145 mEq/L 140  138  135   Potassium 3.5 - 5.1 mEq/L 4.3  4.2  5.2   Chloride 96 - 112 mEq/L 100  103  97   CO2 19 - 32 mEq/L 33  28  27   Calcium 8.4 - 10.5 mg/dL 10.1  9.7  10.4   Total Protein 6.0 - 8.3 g/dL 7.2  7.0    Total Bilirubin 0.2 - 1.2 mg/dL 0.6  0.5    Alkaline Phos 39 - 117 U/L 77  73    AST 0 - 37 U/L 14  15    ALT 0 - 35 U/L 13  13      RUQ sono 01/26/2022: 1. No definite change in the hyperechoic liver mass favored to represent a hemangioma on previous MRI. On the August 05, 2021 MRI, an additional 12 month follow-up MRI was recommended due to cystic  masses in the pancreatic head. Recommend attention to the suspected liver hemangioma at that time to ensure stability. 2. Cirrhotic liver with coarsened echotexture and a mildly nodular Contour  Liver MRI with and without contrast 08/05/2021: 1. 1 cm lesion in the right hepatic lobe appears most consistent with a small hemangioma. Given the small size, consider follow-up ultrasound in 6 months to ensure stability. 2. 2 adjacent complex cystic masses in the posterior head of the pancreas as described, likely side-branch IPMNs. Recommend follow-up MRI with contrast in 12 months. 3. Small left adrenal gland adenoma. 4. Renal cysts. 5. Colonic diverticulosis  ECHO 06/17/2020: 1. Left ventricular ejection fraction, by estimation, is 50 to 55%. The left ventricle has low normal function. The left ventricle has no regional wall motion abnormalities. There is mild left ventricular hypertrophy. 2. Right ventricular systolic function is moderately reduced. The  right ventricular size is moderately enlarged. There is moderately elevated pulmonary artery systolic pressure. 3. Left atrial size was severely dilated. 4. Right atrial size was severely dilated. 5. Eccentric posteriorly directed MR likely related to restricted posterior leaflet motion and atrial enlargement . The mitral valve is abnormal. Moderate mitral valve regurgitation. No evidence of mitral stenosis. 6. Tricuspid valve regurgitation is severe. 7. The aortic valve is normal in structure. Aortic valve regurgitation is not visualized. Mild aortic valve sclerosis is present, with no evidence of aortic valve stenosis. 8. The inferior vena cava is dilated in size with <50% respiratory variability, suggesting right atrial pressure of 15 mmHg.   PAST GI PROCEDURES:  EGD 08/06/2020: - Small distal esophagus varices without signs of recent bleeding. - Changes of mild portal gastropathy. - Unusual duodenum mucosa, ? scalloped. Biopsies for histology were taken with a cold forceps in the duodenal bulb and in the first portion of the duodenum for evaluation of celiac disease.  Colonoscopy 08/06/2020: - Two 4 to 5 mm polyps in the transverse colon and in the ascending colon, removed with a cold snare. Resected and retrieved. - Diverticulosis in the left colon. - The examination was otherwise normal on direct and retroflexion views. - Recall colonoscopy 7 years   A. COLON, ASCENDING, TRANSVERSE, POLYPECTOMY:  - Tubular adenoma(s) without high-grade dysplasia or malignancy  - Hyperplastic polyp(s)   B. DUODENUM, BIOPSY:  - Duodenal mucosa with no specific histopathologic changes  - Negative for increased intraepithelial lymphocytes or villous  architectural changes   Past Medical History:  Diagnosis Date   Atrial fibrillation ([place])    CAD (coronary artery disease) 08/2009   BMS to RCA, non-obstructive in CX and LAD 2011   CHF (congestive heart failure) ([facility])    Edema, peripheral     Hypertension    Nausea    Pneumonia    January 2020   Sleep apnea    Trichomoniasis 06/05/2018   Past Surgical History:  Procedure Laterality Date   BIOPSY  08/06/2020   Procedure: BIOPSY;  Surgeon: Milus Banister, MD;  Location: [facility];  Service: Gastroenterology;;   CESAREAN SECTION     x 1. no BTL   COLONOSCOPY WITH PROPOFOL N/A 08/06/2020   Procedure: COLONOSCOPY WITH PROPOFOL;  Surgeon: Milus Banister, MD;  Location: [facility];  Service: Gastroenterology;  Laterality: N/A;   CORONARY ANGIOPLASTY WITH STENT PLACEMENT     DILATION AND CURETTAGE, DIAGNOSTIC / THERAPEUTIC     for SAB   ESOPHAGOGASTRODUODENOSCOPY (EGD) WITH PROPOFOL N/A 08/06/2020   Procedure: ESOPHAGOGASTRODUODENOSCOPY (EGD) WITH PROPOFOL;  Surgeon: Milus Banister, MD;  Location: [facility];  Service: Gastroenterology;  Laterality: N/A;   HYSTEROSCOPY WITH D & C N/A 03/13/2019   Procedure: DILATATION AND CURETTAGE /HYSTEROSCOPY;  Surgeon: Aletha Halim, MD;  Location: [facility];  Service: Gynecology;  Laterality: N/A;   POLYPECTOMY  08/06/2020   Procedure: POLYPECTOMY;  Surgeon: Milus Banister, MD;  Location: [facility];  Service: Gastroenterology;;   Allergies  Allergen Reactions   Ace Inhibitors Cough   Prednisone Itching and Swelling    Itching on leg and arms swelling from knee to ankles and felt restless      Outpatient Encounter Medications as of 03/02/2022  Medication Sig   albuterol (PROVENTIL HFA;VENTOLIN HFA) 108 (90 Base) MCG/ACT inhaler Inhale 2 puffs into the lungs every 6 (six) hours as needed for wheezing or shortness of breath.   apixaban (ELIQUIS) 5 MG TABS tablet Take 1 tablet (5 mg total) by mouth 2 (two) times daily.   atorvastatin (LIPITOR) 20 MG tablet Take 1 tablet (20 mg total) by mouth every evening.   digoxin (LANOXIN) 0.125 MG tablet Take 0.5 tablets (0.0625 mg total) by mouth daily.   diltiazem (CARDIZEM CD) 360 MG 24 hr capsule Take 1 capsule (360  mg total) by mouth daily.   furosemide (LASIX) 40 MG tablet Take 1 tablet (40 mg total) by mouth daily.   levocetirizine (XYZAL) 5 MG tablet Take 1 tablet by mouth daily.   metoprolol tartrate (LOPRESSOR) 100 MG tablet Take 1 tablet (100 mg total) by mouth 2 (two) times daily.   spironolactone (ALDACTONE) 50 MG tablet Take 1 tablet (50 mg total) by mouth daily.   No facility-administered encounter medications on file as of 03/02/2022.    REVIEW OF SYSTEMS:  Gen: Denies fever, sweats or chills. No weight loss.  CV: Denies chest pain or palpitations.  Occasional ankle/feet edema. Resp: Denies cough, shortness of breath of hemoptysis.  GI: See HPI. GU : Denies urinary burning, blood in urine, increased urinary frequency or incontinence. MS: Denies joint pain, muscles aches or weakness. Derm: Denies rash, itchiness, skin lesions or unhealing ulcers. Psych: Denies depression, anxiety or memory loss. Heme: Denies bruising, easy bleeding. Neuro:  Denies headaches, dizziness or paresthesias. Endo:  Denies any problems with DM, thyroid or adrenal function.  PHYSICAL EXAM: BP (!) 110/92   Pulse (!) 105   Ht 5' 2.5" (1.588 m) Comment: measured without shoes on  Wt 191 lb (86.6 kg)   BMI 34.38 kg/m  Wt Readings from Last 3 Encounters:  03/02/22 191 lb (86.6 kg)  10/18/21 191 lb (86.6 kg)  08/06/21 196 lb (88.9 kg)    General: 66 year old female in no acute distress. Head: Normocephalic and atraumatic. Eyes:  Sclerae non-icteric, conjunctive pink. Ears: Normal auditory acuity. Mouth: Dentition intact. No ulcers or lesions.  Neck: Supple, no lymphadenopathy or thyromegaly.  Lungs: Clear bilaterally to auscultation without wheezes, crackles or rhonchi. Heart: Regular rate and rhythm. Loud MR murmur. No rub or gallop appreciated.  Abdomen: Soft, nontender, non distended. No masses. No hepatosplenomegaly. Normoactive bowel sounds x 4 quadrants.  No ascites. Rectal:  Deferred. Musculoskeletal: Symmetrical with no gross deformities. Skin: Warm and dry. No rash or lesions on visible extremities. Extremities: Trace bilateral ankle edema. Neurological: Alert oriented x 4, no focal deficits.  Psychological:  Alert and cooperative. Normal mood and affect.  ASSESSMENT AND PLAN:  66 year old female with alcohol associated cirrhosis with small esophageal varices +/- congestive hepatopathy with severe TR, moderately reduced RV function with moderately  elevated pulmonary artery systolic pressure per ECHO. Unable to calculate MELD as patient is on Eliquis.  -Dr. Silverio Decamp, covering for Dr. Ardis Hughs today, to verify variceal surveillance EGD due date -Patient counseled no alcohol ever -Maintain a 2 g low-sodium diet -Furosemide/spironolactone management per cardiology -AFP, CBC, CMP and INR -Follow-up in the office in 6 months and as needed -Abdominal MRI due 08/2022  History of tubular adenomatous colon polyps per colonoscopy 08/2020 -Next colonoscopy due 08/2027  Incidental cystic lesions of the pancreas noted per MRI 08/2021 -Surveillance abdominal MRI due 08/2022      CC:  Mardi Mainland,*

## 2022-03-02 NOTE — Patient Instructions (Signed)
Your provider has requested that you go to the basement level for lab work before leaving today. Press "B" on the elevator. The lab is located at the first door on the left as you exit the elevator.  Maintain low sodium diet.   No alcohol.   Follow up in 6 months or sooner if needed.  _______________________________________________________  If you are age 64 or older, your body mass index should be between 23-30. Your Body mass index is 34.38 kg/m. If this is out of the aforementioned range listed, please consider follow up with your Primary Care Provider.  If you are age 15 or younger, your body mass index should be between 19-25. Your Body mass index is 34.38 kg/m. If this is out of the aformentioned range listed, please consider follow up with your Primary Care Provider.   ________________________________________________________  The [facility] providers would like to encourage you to use [facility] to communicate with providers for non-urgent requests or questions.  Due to long hold times on the telephone, sending your provider a message by [facility] may be a faster and more efficient way to get a response.  Please allow 48 business hours for a response.  Please remember that this is for non-urgent requests.  _______________________________________________________

## 2022-03-03 ENCOUNTER — Other Ambulatory Visit: Payer: Self-pay | Admitting: Internal Medicine

## 2022-03-03 DIAGNOSIS — I4821 Permanent atrial fibrillation: Secondary | ICD-10-CM

## 2022-03-03 DIAGNOSIS — I5033 Acute on chronic diastolic (congestive) heart failure: Secondary | ICD-10-CM

## 2022-03-03 LAB — AFP TUMOR MARKER: AFP-Tumor Marker: 2.2 ng/mL

## 2022-03-03 MED ORDER — SPIRONOLACTONE 50 MG PO TABS: 50.0000 mg | ORAL_TABLET | Freq: Every day | ORAL | 0 refills | Status: DC

## 2022-03-03 MED ORDER — DIGOXIN 125 MCG PO TABS: 0.0625 mg | ORAL_TABLET | Freq: Every day | ORAL | 0 refills | Status: DC

## 2022-03-03 MED ORDER — FUROSEMIDE 40 MG PO TABS: 40.0000 mg | ORAL_TABLET | Freq: Every day | ORAL | 1 refills | Status: DC

## 2022-03-03 MED ORDER — ATORVASTATIN CALCIUM 20 MG PO TABS: 20.0000 mg | ORAL_TABLET | Freq: Every evening | ORAL | 0 refills | Status: DC

## 2022-03-03 MED ORDER — APIXABAN 5 MG PO TABS: 5.0000 mg | ORAL_TABLET | Freq: Two times a day (BID) | ORAL | 5 refills | Status: DC

## 2022-03-03 MED ORDER — DILTIAZEM HCL ER COATED BEADS 360 MG PO CP24: 360.0000 mg | ORAL_CAPSULE | Freq: Every day | ORAL | 0 refills | Status: DC

## 2022-03-03 NOTE — Telephone Encounter (Signed)
*  STAT* If patient is at the pharmacy, call can be transferred to refill team.   1. Which medications need to be refilled? (please list name of each medication and dose if known)  diltiazem (CARDIZEM CD) 360 MG 24 hr capsule metoprolol tartrate (LOPRESSOR) 100 MG tablet apixaban (ELIQUIS) 5 MG TABS tablet atorvastatin (LIPITOR) 20 MG tablet digoxin (LANOXIN) 0.125 MG tablet spironolactone (ALDACTONE) 50 MG tablet furosemide (LASIX) 40 MG tablet  2. Which pharmacy/location (including street and city if local pharmacy) is medication to be sent to? [id number]- [place], [place] - [address]  3. Do they need a 30 day or 90 day supply? 90 day

## 2022-03-03 NOTE — Telephone Encounter (Signed)
Prescription refill request for Eliquis received. Indication: Afib  Last office visit: 10/18/21 Margaretann Loveless)  Scr: 0.94 (03/02/22) Age: 66 Weight: 86.6kg  Appropriate dose and refill sent to requested pharmacy.

## 2022-03-10 ENCOUNTER — Other Ambulatory Visit: Payer: Self-pay | Admitting: Internal Medicine

## 2022-03-10 ENCOUNTER — Telehealth: Payer: Self-pay

## 2022-03-10 DIAGNOSIS — I4821 Permanent atrial fibrillation: Secondary | ICD-10-CM

## 2022-03-10 DIAGNOSIS — I5033 Acute on chronic diastolic (congestive) heart failure: Secondary | ICD-10-CM

## 2022-03-10 MED ORDER — METOPROLOL TARTRATE 100 MG PO TABS: 100.0000 mg | ORAL_TABLET | Freq: Two times a day (BID) | ORAL | 3 refills | Status: DC

## 2022-03-10 NOTE — Telephone Encounter (Signed)
Sent  prescription for  Metoprolol

## 2022-03-10 NOTE — Telephone Encounter (Signed)
Called and spoke to patient in regard to medication refills. Per patient she needs refills on Metoprolol Tartrate '100mg'$  BID only.   Per Dr. Delphina Cahill most recent note from 10/2021  Permanent AF Secondary hypercoagulable state Therapeutic drug monitoring Medication management - Eliquis 5 mg BID continue. - continue rate controlling agents with metoprolol tartrate 100 mg BID, diltiazem 360 mg daily, digoxin 0.0625 mg daily 6 times a week. Rates well controlled. - very overdue for digoxin level follow up despite many attempts to coordinate from our office. Will check today.   Refills for Metoprolol sent to patient preferred pharmacy. Advised patient to call back to office with any issues, questions, or concerns. Patient verbalized understanding.

## 2022-04-13 DIAGNOSIS — H0288A Meibomian gland dysfunction right eye, upper and lower eyelids: Secondary | ICD-10-CM | POA: Diagnosis not present

## 2022-04-13 DIAGNOSIS — H353121 Nonexudative age-related macular degeneration, left eye, early dry stage: Secondary | ICD-10-CM | POA: Diagnosis not present

## 2022-04-13 DIAGNOSIS — H0288B Meibomian gland dysfunction left eye, upper and lower eyelids: Secondary | ICD-10-CM | POA: Diagnosis not present

## 2022-04-13 DIAGNOSIS — Z0100 Encounter for examination of eyes and vision without abnormal findings: Secondary | ICD-10-CM | POA: Diagnosis not present

## 2022-04-13 DIAGNOSIS — Z9842 Cataract extraction status, left eye: Secondary | ICD-10-CM | POA: Diagnosis not present

## 2022-04-13 DIAGNOSIS — Z9841 Cataract extraction status, right eye: Secondary | ICD-10-CM | POA: Diagnosis not present

## 2022-04-13 DIAGNOSIS — H5203 Hypermetropia, bilateral: Secondary | ICD-10-CM | POA: Diagnosis not present

## 2022-04-13 DIAGNOSIS — H52223 Regular astigmatism, bilateral: Secondary | ICD-10-CM | POA: Diagnosis not present

## 2022-06-03 ENCOUNTER — Other Ambulatory Visit: Payer: Self-pay | Admitting: Internal Medicine

## 2022-06-03 DIAGNOSIS — I4821 Permanent atrial fibrillation: Secondary | ICD-10-CM

## 2022-06-05 ENCOUNTER — Other Ambulatory Visit: Payer: Self-pay | Admitting: Internal Medicine

## 2022-06-10 ENCOUNTER — Other Ambulatory Visit: Payer: Self-pay

## 2022-06-16 ENCOUNTER — Other Ambulatory Visit: Payer: Self-pay | Admitting: Internal Medicine

## 2022-06-17 ENCOUNTER — Other Ambulatory Visit: Payer: Self-pay | Admitting: Internal Medicine

## 2022-06-17 DIAGNOSIS — I5033 Acute on chronic diastolic (congestive) heart failure: Secondary | ICD-10-CM

## 2022-06-17 DIAGNOSIS — I4821 Permanent atrial fibrillation: Secondary | ICD-10-CM

## 2022-06-17 MED ORDER — ATORVASTATIN CALCIUM 20 MG PO TABS: 20.0000 mg | ORAL_TABLET | Freq: Every evening | ORAL | 0 refills | Status: DC

## 2022-06-17 MED ORDER — DIGOXIN 125 MCG PO TABS: 0.0625 mg | ORAL_TABLET | Freq: Every day | ORAL | 0 refills | Status: DC

## 2022-06-17 MED ORDER — SPIRONOLACTONE 50 MG PO TABS: 50.0000 mg | ORAL_TABLET | Freq: Every day | ORAL | 0 refills | Status: DC

## 2022-06-17 MED ORDER — DILTIAZEM HCL ER COATED BEADS 360 MG PO CP24: 360.0000 mg | ORAL_CAPSULE | Freq: Every day | ORAL | 0 refills | Status: DC

## 2022-06-21 ENCOUNTER — Other Ambulatory Visit: Payer: Self-pay

## 2022-06-21 DIAGNOSIS — K7031 Alcoholic cirrhosis of liver with ascites: Secondary | ICD-10-CM

## 2022-08-08 ENCOUNTER — Telehealth: Payer: Self-pay

## 2022-08-08 ENCOUNTER — Other Ambulatory Visit: Payer: Self-pay

## 2022-08-08 ENCOUNTER — Other Ambulatory Visit (INDEPENDENT_AMBULATORY_CARE_PROVIDER_SITE_OTHER): Payer: Medicare HMO

## 2022-08-08 DIAGNOSIS — R932 Abnormal findings on diagnostic imaging of liver and biliary tract: Secondary | ICD-10-CM | POA: Diagnosis not present

## 2022-08-08 DIAGNOSIS — K7031 Alcoholic cirrhosis of liver with ascites: Secondary | ICD-10-CM

## 2022-08-08 DIAGNOSIS — Z7901 Long term (current) use of anticoagulants: Secondary | ICD-10-CM

## 2022-08-08 LAB — COMPREHENSIVE METABOLIC PANEL
ALT: 16 U/L (ref 0–35)
AST: 18 U/L (ref 0–37)
Albumin: 3.5 g/dL (ref 3.5–5.2)
Alkaline Phosphatase: 62 U/L (ref 39–117)
BUN: 15 mg/dL (ref 6–23)
CO2: 31 mEq/L (ref 19–32)
Calcium: 9.2 mg/dL (ref 8.4–10.5)
Chloride: 101 mEq/L (ref 96–112)
Creatinine, Ser: 0.84 mg/dL (ref 0.40–1.20)
GFR: 73.12 mL/min (ref >60.00)
Glucose, Bld: 100 mg/dL — ABNORMAL HIGH (ref 70–99)
Potassium: 3.7 mEq/L (ref 3.5–5.1)
Sodium: 139 mEq/L (ref 135–145)
Total Bilirubin: 0.9 mg/dL (ref 0.2–1.2)
Total Protein: 6.1 g/dL (ref 6.0–8.3)

## 2022-08-08 LAB — CBC WITH DIFFERENTIAL/PLATELET
Basophils Absolute: 0 10*3/uL (ref 0.0–0.1)
Basophils Relative: 0.4 % (ref 0.0–3.0)
Eosinophils Absolute: 0.2 10*3/uL (ref 0.0–0.7)
Eosinophils Relative: 2 % (ref 0.0–5.0)
HCT: 41.1 % (ref 36.0–46.0)
Hemoglobin: 13.6 g/dL (ref 12.0–15.0)
Lymphocytes Relative: 21 % (ref 12.0–46.0)
Lymphs Abs: 1.7 10*3/uL (ref 0.7–4.0)
MCHC: 33.2 g/dL (ref 30.0–36.0)
MCV: 97.6 fl (ref 78.0–100.0)
Monocytes Absolute: 0.8 10*3/uL (ref 0.1–1.0)
Monocytes Relative: 9.9 % (ref 3.0–12.0)
Neutro Abs: 5.4 10*3/uL (ref 1.4–7.7)
Neutrophils Relative %: 66.7 % (ref 43.0–77.0)
Platelets: 177 10*3/uL (ref 150.0–400.0)
RBC: 4.21 Mil/uL (ref 3.87–5.11)
RDW: 14 % (ref 11.5–15.5)
WBC: 8 10*3/uL (ref 4.0–10.5)

## 2022-08-08 LAB — PROTIME-INR
INR: 1.4 ratio — ABNORMAL HIGH (ref 0.8–1.0)
Prothrombin Time: 15.4 s — ABNORMAL HIGH (ref 9.6–13.1)

## 2022-08-08 NOTE — Telephone Encounter (Signed)
Pt made aware of Carl Best NP recommendations: Orders for labs placed in Epic. Pt stated that she will come today and have labs drawn. Pt verbalized understanding with all questions answered.

## 2022-08-09 ENCOUNTER — Other Ambulatory Visit: Payer: Self-pay | Admitting: Nurse Practitioner

## 2022-08-09 ENCOUNTER — Ambulatory Visit (HOSPITAL_COMMUNITY)
Admission: RE | Admit: 2022-08-09 | Discharge: 2022-08-09 | Disposition: A | Discharge status: Home / Self Care | Payer: Medicare HMO | Source: Ambulatory Visit | Attending: Nurse Practitioner | Admitting: Nurse Practitioner

## 2022-08-09 DIAGNOSIS — K7031 Alcoholic cirrhosis of liver with ascites: Secondary | ICD-10-CM | POA: Insufficient documentation

## 2022-08-09 DIAGNOSIS — R935 Abnormal findings on diagnostic imaging of other abdominal regions, including retroperitoneum: Secondary | ICD-10-CM | POA: Diagnosis not present

## 2022-08-09 DIAGNOSIS — K7460 Unspecified cirrhosis of liver: Secondary | ICD-10-CM | POA: Diagnosis not present

## 2022-08-09 MED ORDER — GADOBUTROL 1 MMOL/ML IV SOLN
8.0000 mL | Freq: Once | INTRAVENOUS | Status: AC | PRN
  Administered 2022-08-09: 8 mL via INTRAVENOUS

## 2022-08-11 LAB — AFP TUMOR MARKER: AFP-Tumor Marker: 1.9 ng/mL

## 2022-08-19 ENCOUNTER — Ambulatory Visit: Payer: Medicare HMO | Admitting: Nurse Practitioner

## 2022-08-19 ENCOUNTER — Encounter: Payer: Self-pay | Admitting: Nurse Practitioner

## 2022-08-19 VITALS — BP 98/60 | HR 72 | Ht 62.5 in | Wt 189.0 lb

## 2022-08-19 DIAGNOSIS — K7031 Alcoholic cirrhosis of liver with ascites: Secondary | ICD-10-CM

## 2022-08-19 NOTE — Patient Instructions (Addendum)
Follow up in 6 months.  No alcohol.  Due to recent changes in healthcare laws, you may see the results of your imaging and laboratory studies on MyChart before your provider has had a chance to review them.  We understand that in some cases there may be results that are confusing or concerning to you. Not all laboratory results come back in the same time frame and the provider may be waiting for multiple results in order to interpret others.  Please give [place] 48 hours in order for your provider to thoroughly review all the results before contacting the office for clarification of your results.   Thank you for trusting me with your gastrointestinal care!   Carl Best, CRNP

## 2022-08-19 NOTE — Progress Notes (Unsigned)
     08/19/2022 Rhonda [name] [id number] 06/19/1955   Chief Complaint:  History of Present Illness: Rhonda [name]. Rhonda [name] is a 67 year old female with past medical history of sleep apnea, hypertension, coronary artery disease s/p RCA sent placement 2011, CHF, severe TR, moderate MR, atrial fibrillation on Eliquis, sleep apnea, left adrenal adenoma, alcohol use disorder, alcohol associated cirrhosis and colon polyps.  She is followed by Dr. Ardis [name].    No N/V.  No CP.   Her most recent EGD and colonoscopy were 08/06/2020. The EGD identified small distal esophageal varices without evidence of recent bleeding and mild portal gastropathy. The colonoscopy identified 2 tubular adenomatous/hyperplastic polyps removed from the transverse and ascending colon and diverticulosis to the left colon. A repeat colonoscopy in 7 years was recommended.   EGD 08/06/2020: - Small distal esophagus varices without signs of recent bleeding. - Changes of mild portal gastropathy. - Unusual duodenum mucosa, ? scalloped. Biopsies for histology were taken with a cold forceps in the duodenal bulb and in the first portion of the duodenum for evaluation of celiac disease.   Colonoscopy 08/06/2020: - Two 4 to 5 mm polyps in the transverse colon and in the ascending colon, removed with a cold snare. Resected and retrieved. - Diverticulosis in the left colon. - The examination was otherwise normal on direct and retroflexion views. - Recall colonoscopy 7 years    A. COLON, ASCENDING, TRANSVERSE, POLYPECTOMY:  - Tubular adenoma(s) without high-grade dysplasia or malignancy  - Hyperplastic polyp(s)   B. DUODENUM, BIOPSY:  - Duodenal mucosa with no specific histopathologic changes  - Negative for increased intraepithelial lymphocytes or villous  architectural changes        Current Medications, Allergies, Past Medical History, Past Surgical History, Family History and Social History were reviewed in [employer] record.   Review of Systems:   Constitutional: Negative for fever, sweats, chills or weight loss.  Respiratory: Negative for shortness of breath.   Cardiovascular: Negative for chest pain, palpitations and leg swelling.  Gastrointestinal: See HPI.  Musculoskeletal: Negative for back pain or muscle aches.  Neurological: Negative for dizziness, headaches or paresthesias.    Physical Exam: BP 98/60 (BP Location: Left Arm, Patient Position: Sitting, Cuff Size: Large)   Pulse 72 Comment: irregular  Ht 5' 2.5" (1.588 m)   Wt 189 lb (85.7 kg)   BMI 34.02 kg/m  General: in no acute distress. Head: Normocephalic and atraumatic. Eyes: No scleral icterus. Conjunctiva pink . Ears: Normal auditory acuity. Mouth: Dentition intact. No ulcers or lesions.  Lungs: Clear throughout to auscultation. Heart: Regular rate and rhythm, no murmur. Abdomen: Soft, nontender and nondistended. No masses or hepatomegaly. Normal bowel sounds x 4 quadrants.  Rectal: Deferred.  Musculoskeletal: Symmetrical with no gross deformities. Extremities: No edema. Neurological: Alert oriented x 4. No focal deficits.  Psychological: Alert and cooperative. Normal mood and affect  Assessment and Recommendations: ***

## 2022-09-05 ENCOUNTER — Telehealth: Payer: Self-pay

## 2022-09-05 NOTE — Telephone Encounter (Signed)
Reminder received in [place].  Chart reviewed. Pt previously had labs drawn.

## 2022-09-05 NOTE — Telephone Encounter (Signed)
Message Received: 4 days ago Noralyn Pick, NP  Gillermina Hu, RN Remo Lipps, refer to 08/19/2022 office visit. Please contact patient next week and schedule her for an EGD with Dr. Silverio Decamp due to history of cirrhosis, EGD to survey for varices. Ok to convert this message to a phone note. Recall MRI in 1 year was previously requested. Thanks.       Previous Messages    ----- Message ----- From: Mauri Pole, MD Sent: 09/01/2022  12:06 PM EDT To: Noralyn Pick, NP  She will need recall MRI in 1 year to document stability.  We can go ahead and schedule her for variceal screening EGD, last EGD in 2022. Thank you ----- Message ----- From: Noralyn Pick, NP Sent: 08/20/2022   2:58 PM EDT To: Mauri Pole, MD  Dr. Silverio Decamp, pls see office note and let me know if you want patient to schedule a variceal EGD and pls review abd MRI and let me know if you agree with one year MRI recall. THX

## 2022-09-05 NOTE — Telephone Encounter (Signed)
-----   Message from Gillermina Hu, RN sent at 03/04/2022  8:34 AM EDT ----- Noralyn Pick, NP  03/04/2022 8:32 AM EDT   Annie Main, please enter lab recall for 6 months. Thank you  Reminder sent 03/04/2022

## 2022-09-05 NOTE — Telephone Encounter (Signed)
Left message for pt to call back  °

## 2022-09-06 ENCOUNTER — Telehealth: Payer: Self-pay

## 2022-09-06 ENCOUNTER — Other Ambulatory Visit: Payer: Self-pay

## 2022-09-06 DIAGNOSIS — K7031 Alcoholic cirrhosis of liver with ascites: Secondary | ICD-10-CM

## 2022-09-06 NOTE — Telephone Encounter (Signed)
[facility] Pre-operative Risk Assessment     Request for surgical clearance:     Endoscopy Procedure  What type of surgery is being performed?     Upper Endoscopy  When is this surgery scheduled?     09/14/2022  What type of clearance is required ?   Pharmacy/Cardiac  Are there any medications that need to be held prior to surgery and how long? Eliquis 2 days  Practice name and name of physician performing surgery?      [facility] Dr. Silverio Decamp   What is your office phone and fax number?      Phone- [phone]  [phone]  Anesthesia type (None, local, MAC, general) ?       MAC

## 2022-09-06 NOTE — Telephone Encounter (Signed)
Pt made aware of Dr. Silverio Decamp recommendations: Pt was scheduled for an EGD on 09/14/2022 at 1:30 PM. Pt to arrive at 12:30. Prep instructions were sent to pt via mychart. Pt made aware; Ambulatory referral to GI placed in Epic.  Cardiac clearance and request to hold Eliquis sent to clearance pool. Pt made aware that her Dr. Gabriel Carina  will be contacting her with the clearance to hold Eliquis and if she has not heard anything from there office a couple day prior to her procedure then to give our office a call.  Pt verbalized understanding with all questions answered.

## 2022-09-06 NOTE — Telephone Encounter (Signed)
Pharmacy please advise on holding Eliquis prior to endoscopy scheduled for 09/14/2022. Thank you.

## 2022-09-08 ENCOUNTER — Telehealth: Payer: Self-pay | Admitting: *Deleted

## 2022-09-08 NOTE — Telephone Encounter (Signed)
Pt pt has been added to Preop App schedule, 09/09/22, due to date of Surgery and time needed to hold Eliquis.  Consent on file / medications reconciled.    Patient Consent for Virtual Visit        CARRIE PROTSMAN Ebony Hail has provided verbal consent on 09/08/2022 for a virtual visit (video or telephone).   CONSENT FOR VIRTUAL VISIT FOR:  Rhonda [name]  By participating in this virtual visit I agree to the following:  I hereby voluntarily request, consent and authorize [facility] and its employed or contracted physicians, physician assistants, nurse practitioners or other licensed health care professionals (the Practitioner), to provide me with telemedicine health care services (the "Services") as deemed necessary by the treating Practitioner. I acknowledge and consent to receive the Services by the Practitioner via telemedicine. I understand that the telemedicine visit will involve communicating with the Practitioner through live audiovisual communication technology and the disclosure of certain medical information by electronic transmission. I acknowledge that I have been given the opportunity to request an in-person assessment or other available alternative prior to the telemedicine visit and am voluntarily participating in the telemedicine visit.  I understand that I have the right to withhold or withdraw my consent to the use of telemedicine in the course of my care at any time, without affecting my right to future care or treatment, and that the Practitioner or I may terminate the telemedicine visit at any time. I understand that I have the right to inspect all information obtained and/or recorded in the course of the telemedicine visit and may receive copies of available information for a reasonable fee.  I understand that some of the potential risks of receiving the Services via telemedicine include:  Delay or interruption in medical evaluation due to technological equipment  failure or disruption; Information transmitted may not be sufficient (e.g. poor resolution of images) to allow for appropriate medical decision making by the Practitioner; and/or  In rare instances, security protocols could fail, causing a breach of personal health information.  Furthermore, I acknowledge that it is my responsibility to provide information about my medical history, conditions and care that is complete and accurate to the best of my ability. I acknowledge that Practitioner's advice, recommendations, and/or decision may be based on factors not within their control, such as incomplete or inaccurate data provided by me or distortions of diagnostic images or specimens that may result from electronic transmissions. I understand that the practice of medicine is not an exact science and that Practitioner makes no warranties or guarantees regarding treatment outcomes. I acknowledge that a copy of this consent can be made available to me via my patient portal ([facility]), or I can request a printed copy by calling the office of [facility].    I understand that my insurance will be billed for this visit.   I have read or had this consent read to me. I understand the contents of this consent, which adequately explains the benefits and risks of the Services being provided via telemedicine.  I have been provided ample opportunity to ask questions regarding this consent and the Services and have had my questions answered to my satisfaction. I give my informed consent for the services to be provided through the use of telemedicine in my medical care

## 2022-09-08 NOTE — Telephone Encounter (Signed)
Patient with diagnosis of afib on Eliquis for anticoagulation.    Procedure: endoscopy Date of procedure: 09/14/22  CHA2DS2-VASc Score = 5  This indicates a 7.2% annual risk of stroke. The patient's score is based upon: CHF History: 1 HTN History: 1 Diabetes History: 0 Stroke History: 0 Vascular Disease History: 1 Age Score: 1 Gender Score: 1  CrCl 58mL/min using adj body weight Platelet count 177K  Per office protocol, patient can hold Eliquis for 2 days prior to procedure as requested.    **This guidance is not considered finalized until pre-operative APP has relayed final recommendations.**

## 2022-09-08 NOTE — Telephone Encounter (Signed)
Pt pt has been added to Preop App schedule, 09/09/22, due to date of Surgery and time needed to hold Eliquis.  Consent on file / medications reconciled.

## 2022-09-08 NOTE — Telephone Encounter (Signed)
   Name: Rhonda [name]  DOB: Oct 18, 1955  MRN: [id number]  Primary Cardiologist: Elouise Munroe, MD   Preoperative team, please contact this patient and set up a phone call appointment for further preoperative risk assessment. Please obtain consent and complete medication review. Thank you for your help.  I confirm that guidance regarding antiplatelet and oral anticoagulation therapy has been completed and, if necessary, noted below.  Per office protocol, patient can hold Eliquis for 2 days prior to procedure as requested.    Deberah Pelton, NP 09/08/2022, 9:32 AM [place]

## 2022-09-09 ENCOUNTER — Ambulatory Visit: Payer: Medicare HMO | Attending: Cardiovascular Disease

## 2022-09-09 ENCOUNTER — Telehealth: Payer: Self-pay

## 2022-09-09 DIAGNOSIS — Z01810 Encounter for preprocedural cardiovascular examination: Secondary | ICD-10-CM | POA: Diagnosis not present

## 2022-09-09 NOTE — Progress Notes (Signed)
Virtual Visit via Telephone Note   Because of Rhonda [name]'s co-morbid illnesses, she is at least at moderate risk for complications without adequate follow up.  This format is felt to be most appropriate for this patient at this time.  The patient did not have access to video technology/had technical difficulties with video requiring transitioning to audio format only (telephone).  All issues noted in this document were discussed and addressed.  No physical exam could be performed with this format.  Please refer to the patient's chart for her consent to telehealth for Rhonda [name].  Evaluation Performed:  Preoperative cardiovascular risk assessment _____________   Date:  09/09/2022   Patient ID:  Rhonda [name], DOB 03-15-1956, MRN [id number] Patient Location:  Home Provider location:   Office  Primary Care Provider:  Valerie RoysBrown, Nykedtra Martin, [name] Primary Cardiologist:  Rhonda PoissonGayatri A Acharya, [name]  Chief Complaint / Patient Profile   67 y.o. y/o female with a h/o atrial fibrillation, chronic diastolic CHF, HTN, hyperlipidemia, coronary artery disease, who is pending Endoscopy and presents today for telephonic preoperative cardiovascular risk assessment.  History of Present Illness    Rhonda [name] is a 67 y.o. female who presents via audio/video conferencing for a telehealth visit today.  Pt was last seen in cardiology clinic on 10/18/2021 by Dr. Jacques [name].  At that time Rhonda [name] was doing well .  The patient is now pending procedure as outlined above. Since her last visit, she remained stable from a cardiac standpoint.  Today she denies chest pain, shortness of breath, lower extremity edema, fatigue, palpitations, melena, hematuria, hemoptysis, diaphoresis, weakness, presyncope, syncope, orthopnea, and PND.   Past Medical History    Past Medical History:  Diagnosis Date   Atrial fibrillation (HCC)    CAD (coronary artery disease) 08/2009    BMS to RCA, non-obstructive in CX and LAD 2011   CHF (congestive heart failure) (HCC)    Edema, peripheral    Hypertension    Nausea    Pneumonia    January 2020   Sleep apnea    Trichomoniasis 06/05/2018   Past Surgical History:  Procedure Laterality Date   BIOPSY  08/06/2020   Procedure: BIOPSY;  Surgeon: Rhonda FeeJacobs, Rhonda [name];  Location: [facility];  Service: Gastroenterology;;   CESAREAN SECTION     x 1. no BTL   COLONOSCOPY WITH PROPOFOL N/A 08/06/2020   Procedure: COLONOSCOPY WITH PROPOFOL;  Surgeon: Rhonda FeeJacobs, Rhonda [name];  [facility];  Service: Gastroenterology;  Laterality: N/A;   CORONARY ANGIOPLASTY WITH STENT PLACEMENT     DILATION AND CURETTAGE, DIAGNOSTIC / THERAPEUTIC     for SAB   ESOPHAGOGASTRODUODENOSCOPY (EGD) WITH PROPOFOL N/A 08/06/2020   Procedure: ESOPHAGOGASTRODUODENOSCOPY (EGD) WITH PROPOFOL;  Surgeon: Rhonda FeeJacobs, Rhonda [name];  Location: [facility];  Service: Gastroenterology;  Laterality: N/A;   HYSTEROSCOPY WITH D & C N/A 03/13/2019   Procedure: DILATATION AND CURETTAGE /HYSTEROSCOPY;  Surgeon: Delshire [name], Charlie, [name];  Location: [facility];  Service: Gynecology;  Laterality: N/A;   POLYPECTOMY  08/06/2020   Procedure: POLYPECTOMY;  Surgeon: Rhonda FeeJacobs, Rhonda [name];  Location: [facility];  Service: Gastroenterology;;    Allergies  Allergies  Allergen Reactions   Ace Inhibitors Cough   Prednisone Itching and Swelling    Itching on leg and arms swelling from knee to ankles and felt restless    Home Medications    Prior to Admission medications   Medication Sig Start  Date End Date Taking? Authorizing Provider  albuterol (PROVENTIL HFA;VENTOLIN HFA) 108 (90 Base) MCG/ACT inhaler Inhale 2 puffs into the lungs every 6 (six) hours as needed for wheezing or shortness of breath. 06/24/18   Katha Hamming, [name]  apixaban (ELIQUIS) 5 MG TABS tablet Take 1 tablet (5 mg total) by mouth 2 (two) times daily. 03/03/22   Rhonda Poisson, [name]   atorvastatin (LIPITOR) 20 MG tablet Take 1 tablet (20 mg total) by mouth every evening. 06/17/22   Rhonda Poisson, [name]  digoxin (LANOXIN) 0.125 MG tablet Take 0.5 tablets (0.0625 mg total) by mouth daily. 06/17/22   Rhonda Poisson, [name]  diltiazem (CARDIZEM CD) 360 MG 24 hr capsule Take 1 capsule (360 mg total) by mouth daily. 06/17/22   Rhonda Poisson, [name]  furosemide (LASIX) 40 MG tablet Take 1 tablet (40 mg total) by mouth daily. 03/03/22   Rhonda Poisson, [name]  levocetirizine (XYZAL) 5 MG tablet Take 1 tablet by mouth daily. 11/16/20   Provider, Historical, [name]  metoprolol tartrate (LOPRESSOR) 100 MG tablet Take 1 tablet (100 mg total) by mouth 2 (two) times daily. 03/10/22   Rhonda Poisson, [name]  spironolactone (ALDACTONE) 50 MG tablet Take 1 tablet (50 mg total) by mouth daily. 06/17/22   Rhonda Poisson, [name]    Physical Exam    Vital Signs:  Rhonda [name] does not have vital signs available for review today.  Given telephonic nature of communication, physical exam is limited. AAOx3. NAD. Normal affect.  Speech and respirations are unlabored.  Accessory Clinical Findings    None  Assessment & Plan    1.  Preoperative Cardiovascular Risk Assessment: Endoscopy, 09/14/2022, [facility], Dr.Nandigam       Primary Cardiologist: Rhonda Poisson, [name]  Chart reviewed as part of pre-operative protocol coverage. Given past medical history and time since last visit, based on ACC/AHA guidelines, Rhonda [name] would be at acceptable risk for the planned procedure without further cardiovascular testing.   Per office protocol, patient can hold Eliquis for 2 days prior to procedure as requested.   Patient was advised that if she develops new symptoms prior to surgery to contact our office to arrange a follow-up appointment.  She verbalized understanding.  I will route this recommendation to the requesting party via Epic fax function and remove from  pre-op pool.       Time:   Today, I have spent  5 minutes with the patient with telehealth technology discussing medical history, symptoms, and management plan.  Prior to her phone evaluation I spent greater than 10 minutes reviewing her cardiac medications and past medical history.   Ronney Asters, NP  09/09/2022, 7:45 AM

## 2022-09-09 NOTE — Telephone Encounter (Signed)
Lm for patient to call back regarding Eliquis

## 2022-09-09 NOTE — Telephone Encounter (Signed)
Pt had a virtual cardiology appointment yesterday.  Spoke with patient and clarified that she understood to hold her Eliquis for 2 days prior to her procedure.  Patient agreed.

## 2022-09-12 NOTE — Telephone Encounter (Signed)
Pt stated that she was contacted by her Dr. previously and is aware to hold the  Eliquis 2 days prior.  Pt verbalized understanding with all questions answered.

## 2022-09-14 ENCOUNTER — Ambulatory Visit (AMBULATORY_SURGERY_CENTER): Payer: Medicare HMO | Admitting: Gastroenterology

## 2022-09-14 ENCOUNTER — Encounter: Payer: Self-pay | Admitting: Gastroenterology

## 2022-09-14 VITALS — BP 99/66 | HR 72 | Temp 97.3°F | Resp 22 | Ht 62.0 in | Wt 189.0 lb

## 2022-09-14 DIAGNOSIS — K7031 Alcoholic cirrhosis of liver with ascites: Secondary | ICD-10-CM | POA: Diagnosis not present

## 2022-09-14 DIAGNOSIS — I8510 Secondary esophageal varices without bleeding: Secondary | ICD-10-CM | POA: Diagnosis not present

## 2022-09-14 DIAGNOSIS — G4730 Sleep apnea, unspecified: Secondary | ICD-10-CM | POA: Diagnosis not present

## 2022-09-14 DIAGNOSIS — I10 Essential (primary) hypertension: Secondary | ICD-10-CM | POA: Diagnosis not present

## 2022-09-14 DIAGNOSIS — B9681 Helicobacter pylori [H. pylori] as the cause of diseases classified elsewhere: Secondary | ICD-10-CM | POA: Diagnosis not present

## 2022-09-14 DIAGNOSIS — K449 Diaphragmatic hernia without obstruction or gangrene: Secondary | ICD-10-CM | POA: Diagnosis not present

## 2022-09-14 DIAGNOSIS — I509 Heart failure, unspecified: Secondary | ICD-10-CM | POA: Diagnosis not present

## 2022-09-14 DIAGNOSIS — K2950 Unspecified chronic gastritis without bleeding: Secondary | ICD-10-CM | POA: Diagnosis not present

## 2022-09-14 DIAGNOSIS — K2970 Gastritis, unspecified, without bleeding: Secondary | ICD-10-CM

## 2022-09-14 DIAGNOSIS — I2510 Atherosclerotic heart disease of native coronary artery without angina pectoris: Secondary | ICD-10-CM | POA: Diagnosis not present

## 2022-09-14 DIAGNOSIS — I4891 Unspecified atrial fibrillation: Secondary | ICD-10-CM | POA: Diagnosis not present

## 2022-09-14 MED ORDER — PANTOPRAZOLE SODIUM 40 MG PO TBEC: 40.0000 mg | DELAYED_RELEASE_TABLET | Freq: Every day | ORAL | 3 refills | Status: DC

## 2022-09-14 MED ORDER — SODIUM CHLORIDE 0.9 % IV SOLN
500.0000 mL | Freq: Once | INTRAVENOUS | Status: DC
Start: 2022-09-14 — End: 2022-09-14

## 2022-09-14 NOTE — Op Note (Addendum)
[facility] Patient Name: Rhonda [name] Procedure Date: 09/14/2022 1:39 PM MRN: [id number] Endoscopist: Napoleon FormKavitha V. Tiyonna Sardinha , MD, [id number] Age: 67 Referring MD:  Date of Birth: [birth year] Gender: Female Account #: [id number] Procedure:                Upper GI endoscopy Indications:              To evaluate esophageal varices in patient with                            suspected portal hypertension, Cirrhosis with                            suspected esophageal varices Medicines:                Monitored Anesthesia Care Procedure:                Pre-Anesthesia Assessment:                           - Prior to the procedure, a History and Physical                            was performed, and patient medications and                            allergies were reviewed. The patient's tolerance of                            previous anesthesia was also reviewed. The risks                            and benefits of the procedure and the sedation                            options and risks were discussed with the patient.                            All questions were answered, and informed consent                            was obtained. Prior Anticoagulants: The patient                            last took Eliquis (apixaban) 2 days prior to the                            procedure. ASA Grade Assessment: III - A patient                            with severe systemic disease. After reviewing the                            risks and benefits, the patient was deemed in  satisfactory condition to undergo the procedure.                           After obtaining informed consent, the endoscope was                            passed under direct vision. Throughout the                            procedure, the patient's blood pressure, pulse, and                            oxygen saturations were monitored continuously. The                             Olympus Scope [id number] was introduced through the                            mouth, and advanced to the second part of duodenum.                            The upper GI endoscopy was accomplished without                            difficulty. The patient tolerated the procedure                            well. Scope In: Scope Out: Findings:                 Grade I varices were found in the lower third of                            the esophagus. They were less than 1 mm in largest                            diameter.                           The Z-line was regular and was found 38 cm from the                            incisors.                           A 1 cm hiatal hernia was present.                           Patchy mild inflammation characterized by                            congestion (edema), erosions and erythema was found                            in the gastric antrum and in the prepyloric region  of the stomach. Biopsies were taken with a cold                            forceps for Helicobacter pylori testing.                           The cardia and gastric fundus were normal on                            retroflexion.                           The examined duodenum was normal. Complications:            No immediate complications. Estimated Blood Loss:     Estimated blood loss was minimal. Impression:               - Grade I esophageal varices.                           - Z-line regular, 38 cm from the incisors.                           - 1 cm hiatal hernia.                           - Gastritis. Biopsied.                           - Normal examined duodenum. Recommendation:           - Patient has a contact number available for                            emergencies. The signs and symptoms of potential                            delayed complications were discussed with the                            patient. Return to normal activities  tomorrow.                            Written discharge instructions were provided to the                            patient.                           - Resume previous diet.                           - Continue present medications.                           - Await pathology results.                           -  Follow an antireflux regimen.                           - No high dose aspirin, ibuprofen, naproxen, or                            other non-steroidal anti-inflammatory drugs.                           - Return to GI office in 3 months.                           - Resume Eliquis (apixaban) at prior dose tomorrow.                            Refer to managing physician for further adjustment                            of therapy.                           - Use Protonix (pantoprazole) 40 mg PO daily Rx for                            90 days with 3 refills. Napoleon Form, MD 09/14/2022 2:03:42 PM This report has been signed electronically.

## 2022-09-14 NOTE — Progress Notes (Signed)
[facility] History and Physical   Primary Care Physician:  Valerie Roys, FNP   Reason for Procedure:  Cirrhosis, portal hypertension, esophageal varices  Plan:    EGD  with possible interventions as needed     HPI: Rhonda [name] is a very pleasant 67 y.o. female here for egd for esophageal varices screening.   The risks and benefits as well as alternatives of endoscopic procedure(s) have been discussed and reviewed. All questions answered. The patient agrees to proceed.    Past Medical History:  Diagnosis Date   Allergy    Atrial fibrillation    CAD (coronary artery disease) 08/2009   BMS to RCA, non-obstructive in CX and LAD 2011   Cataract    CHF (congestive heart failure)    Edema, peripheral    Hypertension    Myocardial infarction    Nausea    Pneumonia    January 2020   Sleep apnea    Trichomoniasis 06/05/2018    Past Surgical History:  Procedure Laterality Date   BIOPSY  08/06/2020   Procedure: BIOPSY;  Surgeon: Rachael Fee, MD;  Location: [facility];  Service: Gastroenterology;;   CESAREAN SECTION     x 1. no BTL   COLONOSCOPY WITH PROPOFOL N/A 08/06/2020   Procedure: COLONOSCOPY WITH PROPOFOL;  Surgeon: Rachael Fee, MD;  Location: [facility];  Service: Gastroenterology;  Laterality: N/A;   CORONARY ANGIOPLASTY WITH STENT PLACEMENT     DILATION AND CURETTAGE, DIAGNOSTIC / THERAPEUTIC     for SAB   ESOPHAGOGASTRODUODENOSCOPY (EGD) WITH PROPOFOL N/A 08/06/2020   Procedure: ESOPHAGOGASTRODUODENOSCOPY (EGD) WITH PROPOFOL;  Surgeon: Rachael Fee, MD;  Location: [facility];  Service: Gastroenterology;  Laterality: N/A;   HYSTEROSCOPY WITH D & C N/A 03/13/2019   Procedure: DILATATION AND CURETTAGE /HYSTEROSCOPY;  Surgeon: Ramah Bing, MD;  Location: [facility];  Service: Gynecology;  Laterality: N/A;   POLYPECTOMY  08/06/2020   Procedure: POLYPECTOMY;  Surgeon: Rachael Fee, MD;  Location: [facility];  Service: Gastroenterology;;    Prior to Admission medications   Medication Sig Start Date End Date Taking? Authorizing Provider  atorvastatin (LIPITOR) 20 MG tablet Take 1 tablet (20 mg total) by mouth every evening. 06/17/22  Yes Parke Poisson, MD  digoxin (LANOXIN) 0.125 MG tablet Take 0.5 tablets (0.0625 mg total) by mouth daily. 06/17/22  Yes Parke Poisson, MD  diltiazem (CARDIZEM CD) 360 MG 24 hr capsule Take 1 capsule (360 mg total) by mouth daily. 06/17/22  Yes Parke Poisson, MD  furosemide (LASIX) 40 MG tablet Take 1 tablet (40 mg total) by mouth daily. 03/03/22  Yes Parke Poisson, MD  metoprolol tartrate (LOPRESSOR) 100 MG tablet Take 1 tablet (100 mg total) by mouth 2 (two) times daily. 03/10/22  Yes Parke Poisson, MD  spironolactone (ALDACTONE) 50 MG tablet Take 1 tablet (50 mg total) by mouth daily. 06/17/22  Yes Parke Poisson, MD  albuterol (PROVENTIL HFA;VENTOLIN HFA) 108 (90 Base) MCG/ACT inhaler Inhale 2 puffs into the lungs every 6 (six) hours as needed for wheezing or shortness of breath. 06/24/18   Katha Hamming, MD  apixaban (ELIQUIS) 5 MG TABS tablet Take 1 tablet (5 mg total) by mouth 2 (two) times daily. 03/03/22   Parke Poisson, MD  levocetirizine (XYZAL) 5 MG tablet Take 1 tablet by mouth daily. 11/16/20   [provider]    Current Outpatient Medications  Medication Sig Dispense Refill  atorvastatin (LIPITOR) 20 MG tablet Take 1 tablet (20 mg total) by mouth every evening. 90 tablet 0   digoxin (LANOXIN) 0.125 MG tablet Take 0.5 tablets (0.0625 mg total) by mouth daily. 45 tablet 0   diltiazem (CARDIZEM CD) 360 MG 24 hr capsule Take 1 capsule (360 mg total) by mouth daily. 90 capsule 0   furosemide (LASIX) 40 MG tablet Take 1 tablet (40 mg total) by mouth daily. 90 tablet 1   metoprolol tartrate (LOPRESSOR) 100 MG tablet Take 1 tablet (100 mg total) by mouth 2 (two) times daily. 180 tablet 3   spironolactone  (ALDACTONE) 50 MG tablet Take 1 tablet (50 mg total) by mouth daily. 90 tablet 0   albuterol (PROVENTIL HFA;VENTOLIN HFA) 108 (90 Base) MCG/ACT inhaler Inhale 2 puffs into the lungs every 6 (six) hours as needed for wheezing or shortness of breath. 1 Inhaler 2   apixaban (ELIQUIS) 5 MG TABS tablet Take 1 tablet (5 mg total) by mouth 2 (two) times daily. 60 tablet 5   levocetirizine (XYZAL) 5 MG tablet Take 1 tablet by mouth daily.     Current Facility-Administered Medications  Medication Dose Route Frequency Provider Last Rate Last Admin   0.9 %  sodium chloride infusion  500 mL Intravenous Once Napoleon Form, MD        Allergies as of 09/14/2022 - Review Complete 09/14/2022  Allergen Reaction Noted   Ace inhibitors Cough 02/04/2016   Prednisone Itching and Swelling 07/03/2018    Family History  Problem Relation Age of Onset   CAD Mother    Diabetes Mellitus II Father    Colon cancer Father    Hypertension Sister    CAD Maternal Grandmother    Kidney disease Son    Breast cancer Neg Hx     Social History   Socioeconomic History   Marital status: Widowed    Spouse name: Not on file   Number of children: 3   Years of education: Not on file   Highest education level: Not on file  Occupational History   Not on file  Tobacco Use   Smoking status: Former    Packs/day: .2    Types: Cigarettes   Smokeless tobacco: Never  Vaping Use   Vaping Use: Never used  Substance and Sexual Activity   Alcohol use: Yes    Alcohol/week: 5.0 standard drinks of alcohol    Types: 2 Cans of beer, 3 Standard drinks or equivalent per week    Comment: "drink at least 3 shots a day" has not drank since January 2024   Drug use: Yes    Types: Marijuana    Comment: nightly   Sexual activity: Yes    Birth control/protection: Post-menopausal  Other Topics Concern   Not on file  Social History Narrative   Not on file   Social Determinants of Health   Financial Resource Strain: Not on  file  Food Insecurity: Not on file  Transportation Needs: Not on file  Physical Activity: Not on file  Stress: Not on file  Social Connections: Not on file  Intimate Partner Violence: Not on file    Review of Systems:  All other review of systems negative except as mentioned in the HPI.  Physical Exam: Vital signs in last 24 hours: Blood Pressure (Abnormal) 100/58   Pulse 68   Temperature (Abnormal) 97.3 F (36.3 C)   Height 5\' 2"  (1.575 m)   Weight 189 lb (85.7 kg)   Oxygen Saturation  96%   Body Mass Index 34.57 kg/m  General:   Alert, NAD Lungs:  Clear .   Heart:  Regular rate and rhythm Abdomen:  Soft, nontender and nondistended. Neuro/Psych:  Alert and cooperative. Normal mood and affect. A and O x 3  Reviewed labs, radiology imaging, old records and pertinent past GI work up  Patient is appropriate for planned procedure(s) and anesthesia in an ambulatory setting   K. Scherry RanVeena Isabeau Mccalla , MD [phone]

## 2022-09-14 NOTE — Patient Instructions (Addendum)
Please read handouts provided. Continue present medications. Await pathology results. Follow an antireflux regimen.  No high dose aspirin, ibuprofen, naproxen, or other non-steriodal anti-inflammatory drugs. Return to GI office in 3 months. Resume Eliquis ( apixaban ) at prior dose tomorrow. Use Protonix ( pantoprazole ) 40 mg daily for 90 days.    YOU HAD AN ENDOSCOPIC PROCEDURE TODAY AT THE [facility]:   Refer to the procedure report that was given to you for any specific questions about what was found during the examination.  If the procedure report does not answer your questions, please call your gastroenterologist to clarify.  If you requested that your care partner not be given the details of your procedure findings, then the procedure report has been included in a sealed envelope for you to review at your convenience later.  YOU SHOULD EXPECT: Some feelings of bloating in the abdomen. Passage of more gas than usual.  Walking can help get rid of the air that was put into your GI tract during the procedure and reduce the bloating. If you had a lower endoscopy (such as a colonoscopy or flexible sigmoidoscopy) you may notice spotting of blood in your stool or on the toilet paper. If you underwent a bowel prep for your procedure, you may not have a normal bowel movement for a few days.  Please Note:  You might notice some irritation and congestion in your nose or some drainage.  This is from the oxygen used during your procedure.  There is no need for concern and it should clear up in a day or so.  SYMPTOMS TO REPORT IMMEDIATELY:  Following upper endoscopy (EGD)  Vomiting of blood or coffee ground material  New chest pain or pain under the shoulder blades  Painful or persistently difficult swallowing  New shortness of breath  Fever of 100F or higher  Black, tarry-looking stools  For urgent or emergent issues, a gastroenterologist can be reached at any hour by calling [phone]  [phone]. Do not use MyChart messaging for urgent concerns.    DIET:  We do recommend a small meal at first, but then you may proceed to your regular diet.  Drink plenty of fluids but you should avoid alcoholic beverages for 24 hours.  ACTIVITY:  You should plan to take it easy for the rest of today and you should NOT DRIVE or use heavy machinery until tomorrow (because of the sedation medicines used during the test).    FOLLOW UP: Our staff will call the number listed on your records the next business day following your procedure.  We will call around 7:15- 8:00 am to check on you and address any questions or concerns that you may have regarding the information given to you following your procedure. If we do not reach you, we will leave a message.     If any biopsies were taken you will be contacted by phone or by letter within the next 1-3 weeks.  Please call us at [phone] if you have not heard about the biopsies in 3 weeks.    SIGNATURES/CONFIDENTIALITY: You and/or your care partner have signed paperwork which will be entered into your electronic medical record.  These signatures attest to the fact that that the information above on your After Visit Summary has been reviewed and is understood.  Full responsibility of the confidentiality of this discharge information lies with you and/or your care-partner.

## 2022-09-14 NOTE — Progress Notes (Signed)
Called to room to assist during endoscopic procedure.  Patient ID and intended procedure confirmed with present staff. Received instructions for my participation in the procedure from the performing physician.  

## 2022-09-14 NOTE — Progress Notes (Signed)
Vss nad trans to mpacu

## 2022-09-15 ENCOUNTER — Other Ambulatory Visit: Payer: Self-pay | Admitting: Internal Medicine

## 2022-09-15 ENCOUNTER — Telehealth: Payer: Self-pay

## 2022-09-15 DIAGNOSIS — I4821 Permanent atrial fibrillation: Secondary | ICD-10-CM

## 2022-09-15 NOTE — Telephone Encounter (Signed)
  Follow up Call-     09/14/2022    1:27 PM  Call back number  Post procedure Call Back phone  # [phone]  Permission to leave phone message Yes     Patient questions:  Do you have a fever, pain , or abdominal swelling? No. Pain Score  0 *  Have you tolerated food without any problems? Yes.    Have you been able to return to your normal activities? Yes.    Do you have any questions about your discharge instructions: Diet   No. Medications  No. Follow up visit  No.  Do you have questions or concerns about your Care? No.  Actions: * If pain score is 4 or above: No action needed, pain <4.

## 2022-09-20 ENCOUNTER — Other Ambulatory Visit: Payer: Self-pay | Admitting: Internal Medicine

## 2022-09-20 ENCOUNTER — Telehealth: Payer: Self-pay

## 2022-09-20 DIAGNOSIS — I4821 Permanent atrial fibrillation: Secondary | ICD-10-CM

## 2022-09-20 NOTE — Telephone Encounter (Signed)
Contacted pt & pt verbalized understanding of stable MRI results.

## 2022-09-20 NOTE — Progress Notes (Signed)
Reviewed and agree with documentation and assessment and plan. K. Veena Jc Veron , MD   

## 2022-09-20 NOTE — Progress Notes (Signed)
Contacted pt & patient is understanding of stable MRI results.

## 2022-09-21 MED ORDER — DILTIAZEM HCL ER COATED BEADS 360 MG PO CP24
360.0000 mg | ORAL_CAPSULE | Freq: Every day | ORAL | 0 refills | Status: DC
Start: 2022-09-21 — End: 2022-11-29

## 2022-09-22 ENCOUNTER — Other Ambulatory Visit: Payer: Self-pay

## 2022-09-22 MED ORDER — METRONIDAZOLE 250 MG PO TABS: 250.0000 mg | ORAL_TABLET | Freq: Four times a day (QID) | ORAL | 0 refills | Status: AC

## 2022-09-22 MED ORDER — DOXYCYCLINE HYCLATE 100 MG PO CAPS: 100.0000 mg | ORAL_CAPSULE | Freq: Two times a day (BID) | ORAL | 0 refills | Status: AC

## 2022-10-03 ENCOUNTER — Other Ambulatory Visit: Payer: Self-pay | Admitting: Nurse Practitioner

## 2022-10-03 DIAGNOSIS — Z1231 Encounter for screening mammogram for malignant neoplasm of breast: Secondary | ICD-10-CM

## 2022-10-10 ENCOUNTER — Other Ambulatory Visit: Payer: Self-pay | Admitting: Internal Medicine

## 2022-10-10 DIAGNOSIS — I4821 Permanent atrial fibrillation: Secondary | ICD-10-CM

## 2022-10-10 DIAGNOSIS — I5033 Acute on chronic diastolic (congestive) heart failure: Secondary | ICD-10-CM

## 2022-10-13 ENCOUNTER — Other Ambulatory Visit: Payer: Self-pay | Admitting: Internal Medicine

## 2022-10-13 ENCOUNTER — Other Ambulatory Visit: Payer: Self-pay

## 2022-10-13 ENCOUNTER — Ambulatory Visit: Payer: Medicare HMO | Admitting: Internal Medicine

## 2022-10-13 ENCOUNTER — Telehealth: Payer: Self-pay

## 2022-10-13 DIAGNOSIS — Z5181 Encounter for therapeutic drug level monitoring: Secondary | ICD-10-CM

## 2022-10-13 DIAGNOSIS — I5033 Acute on chronic diastolic (congestive) heart failure: Secondary | ICD-10-CM

## 2022-10-13 DIAGNOSIS — I4821 Permanent atrial fibrillation: Secondary | ICD-10-CM

## 2022-10-13 MED ORDER — ATORVASTATIN CALCIUM 20 MG PO TABS
20.0000 mg | ORAL_TABLET | Freq: Every evening | ORAL | 0 refills | Status: DC
  Filled 2022-10-13: qty 90, 90d supply, fill #0

## 2022-10-13 MED ORDER — DIGOXIN 125 MCG PO TABS
0.0625 mg | ORAL_TABLET | Freq: Every day | ORAL | 1 refills | Status: DC
Start: 2022-10-13 — End: 2023-02-15
  Filled 2022-10-13: qty 45, 90d supply, fill #0
  Filled 2023-02-06: qty 45, 90d supply, fill #1

## 2022-10-13 NOTE — Telephone Encounter (Signed)
Received message from Dr. Jacques Navy patient is over due for Digoxin level to be checked.   Spoke with patient and made her aware- patient states she will go to [facility] today and have it done. Lab order placed.   Advised patient to call back to office with any issues, questions, or concerns. Patient verbalized understanding.

## 2022-10-14 ENCOUNTER — Other Ambulatory Visit: Payer: Self-pay

## 2022-10-14 ENCOUNTER — Telehealth: Payer: Self-pay

## 2022-10-14 DIAGNOSIS — Z5181 Encounter for therapeutic drug level monitoring: Secondary | ICD-10-CM

## 2022-10-14 DIAGNOSIS — Z01810 Encounter for preprocedural cardiovascular examination: Secondary | ICD-10-CM

## 2022-10-14 NOTE — Telephone Encounter (Signed)
Patient states she was at [facility] and her labs were not placed. Digoxin level was ordered yesterday. I reordered today. Lab is placed.

## 2022-10-18 DIAGNOSIS — Z5181 Encounter for therapeutic drug level monitoring: Secondary | ICD-10-CM | POA: Diagnosis not present

## 2022-10-18 DIAGNOSIS — I4891 Unspecified atrial fibrillation: Secondary | ICD-10-CM | POA: Diagnosis not present

## 2022-10-19 LAB — DIGOXIN LEVEL: Digoxin, Serum: 0.4 ng/mL — ABNORMAL LOW (ref 0.5–0.9)

## 2022-10-20 ENCOUNTER — Other Ambulatory Visit: Payer: Self-pay | Admitting: Internal Medicine

## 2022-11-01 ENCOUNTER — Ambulatory Visit
Admission: RE | Admit: 2022-11-01 | Discharge: 2022-11-01 | Disposition: A | Discharge status: Home / Self Care | Payer: Medicare HMO | Source: Ambulatory Visit | Attending: Nurse Practitioner | Admitting: Nurse Practitioner

## 2022-11-01 ENCOUNTER — Other Ambulatory Visit: Payer: Self-pay | Admitting: Gastroenterology

## 2022-11-01 DIAGNOSIS — Z1231 Encounter for screening mammogram for malignant neoplasm of breast: Secondary | ICD-10-CM | POA: Diagnosis not present

## 2022-11-01 DIAGNOSIS — A048 Other specified bacterial intestinal infections: Secondary | ICD-10-CM

## 2022-11-16 ENCOUNTER — Other Ambulatory Visit: Payer: Medicare HMO

## 2022-11-17 ENCOUNTER — Other Ambulatory Visit: Payer: Medicare HMO

## 2022-11-17 DIAGNOSIS — A048 Other specified bacterial intestinal infections: Secondary | ICD-10-CM | POA: Diagnosis not present

## 2022-11-18 LAB — HELICOBACTER PYLORI  SPECIAL ANTIGEN
MICRO NUMBER:: 15082784
SPECIMEN QUALITY: ADEQUATE

## 2022-11-28 ENCOUNTER — Encounter: Payer: Self-pay | Admitting: Internal Medicine

## 2022-11-28 ENCOUNTER — Ambulatory Visit: Payer: Medicare HMO | Attending: Internal Medicine | Admitting: Internal Medicine

## 2022-11-28 VITALS — BP 102/68 | HR 99 | Ht 61.0 in | Wt 167.2 lb

## 2022-11-28 DIAGNOSIS — I4821 Permanent atrial fibrillation: Secondary | ICD-10-CM

## 2022-11-28 DIAGNOSIS — Z79899 Other long term (current) drug therapy: Secondary | ICD-10-CM | POA: Diagnosis not present

## 2022-11-28 DIAGNOSIS — I5032 Chronic diastolic (congestive) heart failure: Secondary | ICD-10-CM | POA: Diagnosis not present

## 2022-11-28 DIAGNOSIS — I484 Atypical atrial flutter: Secondary | ICD-10-CM

## 2022-11-28 NOTE — Progress Notes (Signed)
Cardiology Office Note:    Date:  11/28/2022   ID:  Rhonda [name], Rhonda [name] 04/22/1956, MRN [id number]  PCP:  Valerie Roys, FNP  Cardiologist:  Parke Poisson, MD  Electrophysiologist:  None   Referring MD: Valerie Roys   Chief Complaint/Reason for Referral: HFpEF, afib.  History of Present Illness:    Rhonda [name] is a 67 y.o. female with a history of chronic diastolic congestive heart failure, coronary artery disease with prior PCI, hypertension, persistent atrial fibrillation, history of heavy alcohol abuse recently hospitalized in January '22 with acute on chronic diastolic congestive heart failure and cirrhosis, diuresis followed by GI at their request.   Currently she is feeling well, and has lost almost 30 pounds.  She informs me that her husband passed away approximately 1 year ago, not too long after our last visit.  From her description it seems that he passed from sudden cardiac death while they were on vacation in [place]. I expressed my condolences.  She plans to travel back to [place] in the next few weeks, traveling with her granddaughter, and overall she is in good spirits.  We discussed her slightly low digoxin level, approximately 2 weeks ago the patient increased her dose of digoxin to 7 days a week.  We will check a dig level today.  She is overdue for repeat lipids and we will check those today as well.  She has no physical concerns.  She does have a more prominent systolic apical murmur today likely consistent with her known mitral valve regurgitation as well as her known tricuspid valve regurgitation.  I suspect these are atrial functional lesions but will recheck an echocardiogram to confirm.  No chest pain, no shortness of breath, controlled lower extremity swelling contributed to by cirrhosis and venous insufficiency.  Diastolic heart failure seems well-managed on daily Lasix 40 mg daily and 50 mg of spironolactone.  Otherwise  she continues on apixaban with no bleeding complications for history of atrial fibrillation.  Interestingly, her EKG today is consistent with atrial flutter but she remains asymptomatic.  She reminds me that she has had 2 cardioversions in the past which did not provide maintenance of sinus rhythm.   Past Medical History:  Diagnosis Date   Allergy    Atrial fibrillation (HCC)    CAD (coronary artery disease) 08/2009   BMS to RCA, non-obstructive in CX and LAD 2011   Cataract    CHF (congestive heart failure) (HCC)    Edema, peripheral    Hypertension    Myocardial infarction [facility])    Nausea    Pneumonia    January 2020   Sleep apnea    Trichomoniasis 06/05/2018    Past Surgical History:  Procedure Laterality Date   BIOPSY  08/06/2020   Procedure: BIOPSY;  Surgeon: Rachael Fee, MD;  Location: [facility];  Service: Gastroenterology;;   CESAREAN SECTION     x 1. no BTL   COLONOSCOPY WITH PROPOFOL N/A 08/06/2020   Procedure: COLONOSCOPY WITH PROPOFOL;  Surgeon: Rachael Fee, MD;  Location: [facility];  Service: Gastroenterology;  Laterality: N/A;   CORONARY ANGIOPLASTY WITH STENT PLACEMENT     DILATION AND CURETTAGE, DIAGNOSTIC / THERAPEUTIC     for SAB   ESOPHAGOGASTRODUODENOSCOPY (EGD) WITH PROPOFOL N/A 08/06/2020   Procedure: ESOPHAGOGASTRODUODENOSCOPY (EGD) WITH PROPOFOL;  Surgeon: Rachael Fee, MD;  Location: [facility];  Service: Gastroenterology;  Laterality: N/A;   HYSTEROSCOPY WITH D & C N/A 03/13/2019  Procedure: DILATATION AND CURETTAGE /HYSTEROSCOPY;  Surgeon: Lockbourne Bing, MD;  Location: [facility];  Service: Gynecology;  Laterality: N/A;   POLYPECTOMY  08/06/2020   Procedure: POLYPECTOMY;  Surgeon: Rachael Fee, MD;  Location: [facility];  Service: Gastroenterology;;    Current Medications: Current Meds  Medication Sig   albuterol (PROVENTIL HFA;VENTOLIN HFA) 108 (90 Base) MCG/ACT inhaler Inhale 2 puffs into the lungs every 6  (six) hours as needed for wheezing or shortness of breath.   apixaban (ELIQUIS) 5 MG TABS tablet Take 1 tablet (5 mg total) by mouth 2 (two) times daily.   atorvastatin (LIPITOR) 20 MG tablet Take 1 tablet (20 mg total) by mouth every evening.   digoxin (LANOXIN) 0.125 MG tablet Take 0.5 tablets (0.0625 mg total) by mouth daily.   diltiazem (CARDIZEM CD) 360 MG 24 hr capsule Take 1 capsule (360 mg total) by mouth daily.   furosemide (LASIX) 40 MG tablet Take 1 tablet (40 mg total) by mouth daily.   metoprolol tartrate (LOPRESSOR) 100 MG tablet Take 1 tablet (100 mg total) by mouth 2 (two) times daily.   pantoprazole (PROTONIX) 40 MG tablet Take 1 tablet (40 mg total) by mouth daily.   spironolactone (ALDACTONE) 50 MG tablet Take 1 tablet (50 mg total) by mouth daily.     Allergies:   Ace inhibitors and Prednisone   Social History   Tobacco Use   Smoking status: Former    Packs/day: .2    Types: Cigarettes   Smokeless tobacco: Never  Vaping Use   Vaping Use: Never used  Substance Use Topics   Alcohol use: Yes    Alcohol/week: 5.0 standard drinks of alcohol    Types: 2 Cans of beer, 3 Standard drinks or equivalent per week    Comment: "drink at least 3 shots a day" has not drank since January 2024   Drug use: Yes    Types: Marijuana    Comment: nightly     Family History: The patient's family history includes Breast cancer (age of onset: 41) in her paternal aunt; CAD in her maternal grandmother and mother; Colon cancer in her father; Diabetes Mellitus II in her father; Hypertension in her sister; Kidney disease in her son.  ROS:   Please see the history of present illness.    All other systems reviewed and are negative.  EKGs/Labs/Other Studies Reviewed:    The following studies were reviewed today:  Echo Limited 06/17/2020:  1. Left ventricular ejection fraction, by estimation, is 50 to 55%. The  left ventricle has low normal function. The left ventricle has no regional   wall motion abnormalities. There is mild left ventricular hypertrophy.   2. Right ventricular systolic function is moderately reduced. The right  ventricular size is moderately enlarged. There is moderately elevated  pulmonary artery systolic pressure.   3. Left atrial size was severely dilated.   4. Right atrial size was severely dilated.   5. Eccentric posteriorly directed MR likely related to restricted  posterior leaflet motion and atrial enlargement . The mitral valve is  abnormal. Moderate mitral valve regurgitation. No evidence of mitral  stenosis.   6. Tricuspid valve regurgitation is severe.   7. The aortic valve is normal in structure. Aortic valve regurgitation is  not visualized. Mild aortic valve sclerosis is present, with no evidence  of aortic valve stenosis.   8. The inferior vena cava is dilated in size with <50% respiratory  variability, suggesting right atrial pressure of  15 mmHg.   Echo 01/03/2020:  1. Inferior basal hypokinesis. Left ventricular ejection fraction, by  estimation, is 50 to 55%. The left ventricle has low normal function. The  left ventricle demonstrates regional wall motion abnormalities (see  scoring diagram/findings for  description). The left ventricular internal cavity size was mildly  dilated. Left ventricular diastolic parameters are indeterminate.   2. Right ventricular systolic function is normal. The right ventricular  size is normal. There is moderately elevated pulmonary artery systolic  pressure.   3. Left atrial size was severely dilated.   4. Right atrial size was moderately dilated.   5. MR eccentric posterior ? from atrial enlargement and type 3A  restricted posterior leaflet motion. Consider TEE for further assess  severity if clinically indicated . The mitral valve is normal in  structure. Moderate mitral valve regurgitation.  Moderate to severe mitral stenosis.   6. Tricuspid valve regurgitation is moderate.   7. The aortic  valve is normal in structure. Aortic valve regurgitation is  not visualized. No aortic stenosis is present.   8. The inferior vena cava is normal in size with greater than 50%  respiratory variability, suggesting right atrial pressure of 3 mmHg.   Comparison(s): 04/03/18 EF 50-55%.    EKG:  EKG is personally reviewed. EKG Interpretation  Date/Time:  Monday November 28 2022 08:51:57 EDT Ventricular Rate:  99 PR Interval:    QRS Duration: 92 QT Interval:  338 QTC Calculation: 433 R Axis:   74 Text Interpretation: Atrial flutter with variable AV conduction Nonspecific ST abnormality anterior leads When compared with ECG of 15-Jun-2020 22:04, Vent. rate has increased BY  42 BPM Confirmed by Weston Brass ([id number]) on 11/28/2022 1:44:01 PM   10/18/2021: Atrial fibrillation. Rate 87 bpm. 09/29/2020: Afib rate 62 bpm  I have independently reviewed the images and report from EGD/colonoscopy 08/06/20.   Recent Labs: 08/08/2022: ALT 16; BUN 15; Creatinine, Ser 0.84; Hemoglobin 13.6; Platelets 177.0; Potassium 3.7; Sodium 139   Recent Lipid Panel No results found for: "CHOL", "TRIG", "HDL", "CHOLHDL", "VLDL", "LDLCALC", "LDLDIRECT"  Physical Exam:    VS:  BP 102/68 (BP Location: Left Arm, Patient Position: Sitting, Cuff Size: Normal)   Pulse 99   Ht 5\' 1"  (1.549 m)   Wt 167 lb 3.2 oz (75.8 kg)   SpO2 96%   BMI 31.59 kg/m     Wt Readings from Last 5 Encounters:  11/28/22 167 lb 3.2 oz (75.8 kg)  09/14/22 189 lb (85.7 kg)  08/19/22 189 lb (85.7 kg)  03/02/22 191 lb (86.6 kg)  10/18/21 191 lb (86.6 kg)    Constitutional: No acute distress Eyes: sclera non-icteric, normal conjunctiva and lids ENMT: normal dentition, moist mucous membranes Cardiovascular: irregular rhythm, normal rate, 3/6 murmur holosystolic apex. S1 and S2 normal. No jugular venous distention.  Respiratory: clear to auscultation bilaterally GI : normal bowel sounds, soft and nontender. No distention.   MSK: extremities  warm, well perfused. 1+ edema.  NEURO: grossly nonfocal exam, moves all extremities. PSYCH: alert and oriented x 3, normal mood and affect.   ASSESSMENT:    1. Permanent atrial fibrillation (HCC)   2. Medication management   3. Chronic diastolic HF (heart failure) (HCC)     PLAN:    Permanent AF/atypical atrial flutter Secondary hypercoagulable state Therapeutic drug monitoring Medication management - Eliquis 5 mg BID continue. No bleeding. - continue rate controlling agents with metoprolol tartrate 100 mg BID, diltiazem 360 mg daily, digoxin 0.0625 mg daily 7  times a week. Rates still acceptable but now in atrial flutter.  - recheck digoxin level today since she increased digoxin about 2 weeks ago.  - repeat echo due to louder systolic murmur. May need to consider EP referral given flutter if she is a reasonable ablation candidate. Await echo. AF felt to be permanent so unlikely to be a good option.   HTN - as above, metoprolol to 100 mg BID. Can consider nadolol but no evidence of varies per patient on recent scope. Did have H pylori now treated, no bleeding ulcers per her report.   Chronic diastolic HF - overall stable, waxing and waning edema, NYHA class I dyspnea and appears grossly euvolemic. By request of GI, diuretics managed by Dr. Christella Hartigan. Continue spironolactone 50 mg daily and furosemide 40 mg daily.   Total time of encounter: 30 minutes total time of encounter, including 20 minutes spent in face-to-face patient care on the date of this encounter. This time includes coordination of care and counseling regarding above mentioned problem list. Remainder of non-face-to-face time involved reviewing chart documents/testing relevant to the patient encounter and documentation in the medical record. I have independently reviewed documentation from referring provider.   Weston Brass, MD, [facility]  [facility]     Medication Adjustments/Labs and Tests  Ordered: Current medicines are reviewed at length with the patient today.  Concerns regarding medicines are outlined above.   Orders Placed This Encounter  Procedures   Digoxin level   Lipid panel   EKG 12-Lead   ECHOCARDIOGRAM COMPLETE    No orders of the defined types were placed in this encounter.   Patient Instructions  Medication Instructions:  Your physician recommends that you continue on your current medications as directed. Please refer to the Current Medication list given to you today.  *If you need a refill on your cardiac medications before your next appointment, please call your pharmacy*   Lab Work: Your physician recommends that you have labs drawn today: lipids & digoxin levels  If you have labs (blood work) drawn today and your tests are completely normal, you will receive your results only by: MyChart Message (if you have MyChart) OR A paper copy in the mail If you have any lab test that is abnormal or we need to change your treatment, we will call you to review the results.   Testing/Procedures: Your physician has requested that you have an echocardiogram. Echocardiography is a painless test that uses sound waves to create images of your heart. It provides your doctor with information about the size and shape of your heart and how well your heart's chambers and valves are working. This procedure takes approximately one hour. There are no restrictions for this procedure. Please do NOT wear cologne, perfume, aftershave, or lotions (deodorant is allowed). Please arrive 15 minutes prior to your appointment time. This will take place at [address] [place]    Follow-Up: At [facility], you and your health needs are our priority.  As part of our continuing mission to provide you with exceptional heart care, we have created designated Provider Care Teams.  These Care Teams include your primary Cardiologist (physician) and Advanced Practice Providers  (APPs -  Physician Assistants and Nurse Practitioners) who all work together to provide you with the care you need, when you need it.  We recommend signing up for the patient portal called "MyChart".  Sign up information is provided on this After Visit Summary.  MyChart  is used to connect with patients for Virtual Visits (Telemedicine).  Patients are able to view lab/test results, encounter notes, upcoming appointments, etc.  Non-urgent messages can be sent to your provider as well.   To learn more about what you can do with MyChart, go to [email or web address].    Your next appointment:   6 month(s)  Provider:   Parke Poisson, MD

## 2022-11-28 NOTE — Patient Instructions (Signed)
Medication Instructions:  Your physician recommends that you continue on your current medications as directed. Please refer to the Current Medication list given to you today.  *If you need a refill on your cardiac medications before your next appointment, please call your pharmacy*   Lab Work: Your physician recommends that you have labs drawn today: lipids & digoxin levels  If you have labs (blood work) drawn today and your tests are completely normal, you will receive your results only by: MyChart Message (if you have MyChart) OR A paper copy in the mail If you have any lab test that is abnormal or we need to change your treatment, we will call you to review the results.   Testing/Procedures: Your physician has requested that you have an echocardiogram. Echocardiography is a painless test that uses sound waves to create images of your heart. It provides your doctor with information about the size and shape of your heart and how well your heart's chambers and valves are working. This procedure takes approximately one hour. There are no restrictions for this procedure. Please do NOT wear cologne, perfume, aftershave, or lotions (deodorant is allowed). Please arrive 15 minutes prior to your appointment time. This will take place at [address] [place]    Follow-Up: At [facility], you and your health needs are our priority.  As part of our continuing mission to provide you with exceptional heart care, we have created designated Provider Care Teams.  These Care Teams include your primary Cardiologist (physician) and Advanced Practice Providers (APPs -  Physician Assistants and Nurse Practitioners) who all work together to provide you with the care you need, when you need it.  We recommend signing up for the patient portal called "MyChart".  Sign up information is provided on this After Visit Summary.  MyChart is used to connect with patients for Virtual Visits (Telemedicine).   Patients are able to view lab/test results, encounter notes, upcoming appointments, etc.  Non-urgent messages can be sent to your provider as well.   To learn more about what you can do with MyChart, go to [email or web address].    Your next appointment:   6 month(s)  Provider:   Parke Poisson, MD

## 2022-11-29 ENCOUNTER — Other Ambulatory Visit: Payer: Self-pay | Admitting: Internal Medicine

## 2022-11-29 DIAGNOSIS — I5033 Acute on chronic diastolic (congestive) heart failure: Secondary | ICD-10-CM

## 2022-11-29 DIAGNOSIS — I4821 Permanent atrial fibrillation: Secondary | ICD-10-CM

## 2022-11-29 LAB — LIPID PANEL
Chol/HDL Ratio: 3 ratio (ref 0.0–4.4)
Cholesterol, Total: 142 mg/dL (ref 100–199)
HDL: 47 mg/dL (ref >39)
LDL Chol Calc (NIH): 81 mg/dL (ref 0–99)
Triglycerides: 70 mg/dL (ref 0–149)
VLDL Cholesterol Cal: 14 mg/dL (ref 5–40)

## 2022-11-29 LAB — DIGOXIN LEVEL: Digoxin, Serum: 0.6 ng/mL (ref 0.5–0.9)

## 2022-11-29 MED ORDER — SPIRONOLACTONE 50 MG PO TABS: 50.0000 mg | ORAL_TABLET | Freq: Every day | ORAL | 3 refills | Status: DC

## 2022-12-19 ENCOUNTER — Ambulatory Visit (HOSPITAL_COMMUNITY): Payer: Medicare HMO | Attending: Internal Medicine

## 2022-12-19 DIAGNOSIS — I4821 Permanent atrial fibrillation: Secondary | ICD-10-CM

## 2022-12-19 DIAGNOSIS — I5032 Chronic diastolic (congestive) heart failure: Secondary | ICD-10-CM | POA: Diagnosis not present

## 2022-12-19 LAB — ECHOCARDIOGRAM COMPLETE
MV M vel: 5.04 m/s
MV Peak grad: 101.6 mmHg
Radius: 1 cm
S' Lateral: 4.6 cm

## 2023-02-06 ENCOUNTER — Other Ambulatory Visit: Payer: Self-pay | Admitting: Internal Medicine

## 2023-02-07 ENCOUNTER — Other Ambulatory Visit: Payer: Self-pay

## 2023-02-11 ENCOUNTER — Other Ambulatory Visit (HOSPITAL_COMMUNITY): Payer: Self-pay

## 2023-02-14 ENCOUNTER — Other Ambulatory Visit: Payer: Self-pay

## 2023-02-14 ENCOUNTER — Telehealth: Payer: Self-pay

## 2023-02-14 DIAGNOSIS — K7031 Alcoholic cirrhosis of liver with ascites: Secondary | ICD-10-CM

## 2023-02-14 DIAGNOSIS — R932 Abnormal findings on diagnostic imaging of liver and biliary tract: Secondary | ICD-10-CM

## 2023-02-14 DIAGNOSIS — Z7901 Long term (current) use of anticoagulants: Secondary | ICD-10-CM

## 2023-02-14 NOTE — Telephone Encounter (Signed)
-----   Message from Nurse Joselyn Glassman sent at 08/12/2022  9:43 AM EST ----- Personal reminder sent 08/12/2022   AFP recall 6 months.

## 2023-02-14 NOTE — Telephone Encounter (Signed)
Reminder was received in Epic. Order for lab placed in Epic. Pt made aware. Pt verbalized understanding with all questions answered.

## 2023-02-15 ENCOUNTER — Other Ambulatory Visit: Payer: Self-pay

## 2023-02-15 DIAGNOSIS — I4821 Permanent atrial fibrillation: Secondary | ICD-10-CM

## 2023-02-15 DIAGNOSIS — I5033 Acute on chronic diastolic (congestive) heart failure: Secondary | ICD-10-CM

## 2023-02-15 MED ORDER — DIGOXIN 125 MCG PO TABS
0.0625 mg | ORAL_TABLET | Freq: Every day | ORAL | 3 refills | Status: DC
Start: 2023-02-15 — End: 2024-02-14

## 2023-02-15 MED ORDER — ATORVASTATIN CALCIUM 20 MG PO TABS: 20.0000 mg | ORAL_TABLET | Freq: Every evening | ORAL | 3 refills | Status: DC

## 2023-02-20 ENCOUNTER — Other Ambulatory Visit: Payer: Medicare HMO

## 2023-02-20 DIAGNOSIS — R932 Abnormal findings on diagnostic imaging of liver and biliary tract: Secondary | ICD-10-CM | POA: Diagnosis not present

## 2023-02-20 DIAGNOSIS — K7031 Alcoholic cirrhosis of liver with ascites: Secondary | ICD-10-CM | POA: Diagnosis not present

## 2023-02-20 DIAGNOSIS — Z7901 Long term (current) use of anticoagulants: Secondary | ICD-10-CM | POA: Diagnosis not present

## 2023-02-27 ENCOUNTER — Other Ambulatory Visit: Payer: Self-pay

## 2023-02-27 DIAGNOSIS — K7031 Alcoholic cirrhosis of liver with ascites: Secondary | ICD-10-CM

## 2023-02-28 ENCOUNTER — Ambulatory Visit
Admission: RE | Admit: 2023-02-28 | Discharge: 2023-02-28 | Disposition: A | Discharge status: Home / Self Care | Payer: Medicare HMO | Source: Ambulatory Visit | Attending: Nurse Practitioner | Admitting: Nurse Practitioner

## 2023-02-28 DIAGNOSIS — K7031 Alcoholic cirrhosis of liver with ascites: Secondary | ICD-10-CM | POA: Insufficient documentation

## 2023-02-28 DIAGNOSIS — K7460 Unspecified cirrhosis of liver: Secondary | ICD-10-CM | POA: Diagnosis not present

## 2023-02-28 DIAGNOSIS — N281 Cyst of kidney, acquired: Secondary | ICD-10-CM | POA: Diagnosis not present

## 2023-03-01 ENCOUNTER — Other Ambulatory Visit (INDEPENDENT_AMBULATORY_CARE_PROVIDER_SITE_OTHER): Payer: Medicare HMO

## 2023-03-01 ENCOUNTER — Ambulatory Visit: Payer: Medicare HMO | Admitting: Gastroenterology

## 2023-03-01 ENCOUNTER — Encounter: Payer: Self-pay | Admitting: Gastroenterology

## 2023-03-01 VITALS — BP 110/60 | HR 82 | Ht 61.0 in | Wt 164.5 lb

## 2023-03-01 DIAGNOSIS — R600 Localized edema: Secondary | ICD-10-CM | POA: Diagnosis not present

## 2023-03-01 DIAGNOSIS — I5033 Acute on chronic diastolic (congestive) heart failure: Secondary | ICD-10-CM

## 2023-03-01 DIAGNOSIS — K7031 Alcoholic cirrhosis of liver with ascites: Secondary | ICD-10-CM | POA: Diagnosis not present

## 2023-03-01 LAB — COMPREHENSIVE METABOLIC PANEL
ALT: 11 U/L (ref 0–35)
AST: 14 U/L (ref 0–37)
Albumin: 4.1 g/dL (ref 3.5–5.2)
Alkaline Phosphatase: 74 U/L (ref 39–117)
BUN: 16 mg/dL (ref 6–23)
CO2: 32 mEq/L (ref 19–32)
Calcium: 9.7 mg/dL (ref 8.4–10.5)
Chloride: 101 mEq/L (ref 96–112)
Creatinine, Ser: 0.89 mg/dL (ref 0.40–1.20)
GFR: 67.95 mL/min (ref >60.00)
Glucose, Bld: 100 mg/dL — ABNORMAL HIGH (ref 70–99)
Potassium: 4 mEq/L (ref 3.5–5.1)
Sodium: 140 mEq/L (ref 135–145)
Total Bilirubin: 1 mg/dL (ref 0.2–1.2)
Total Protein: 6.8 g/dL (ref 6.0–8.3)

## 2023-03-01 LAB — PROTIME-INR
INR: 1.5 ratio — ABNORMAL HIGH (ref 0.8–1.0)
Prothrombin Time: 16.1 s — ABNORMAL HIGH (ref 9.6–13.1)

## 2023-03-01 LAB — CBC WITH DIFFERENTIAL/PLATELET
Basophils Absolute: 0 10*3/uL (ref 0.0–0.1)
Basophils Relative: 0.6 % (ref 0.0–3.0)
Eosinophils Absolute: 0.2 10*3/uL (ref 0.0–0.7)
Eosinophils Relative: 2.2 % (ref 0.0–5.0)
HCT: 40.7 % (ref 36.0–46.0)
Hemoglobin: 13.3 g/dL (ref 12.0–15.0)
Lymphocytes Relative: 22 % (ref 12.0–46.0)
Lymphs Abs: 1.6 10*3/uL (ref 0.7–4.0)
MCHC: 32.7 g/dL (ref 30.0–36.0)
MCV: 93.8 fl (ref 78.0–100.0)
Monocytes Absolute: 0.7 10*3/uL (ref 0.1–1.0)
Monocytes Relative: 9.2 % (ref 3.0–12.0)
Neutro Abs: 4.9 10*3/uL (ref 1.4–7.7)
Neutrophils Relative %: 66 % (ref 43.0–77.0)
Platelets: 181 10*3/uL (ref 150.0–400.0)
RBC: 4.34 Mil/uL (ref 3.87–5.11)
RDW: 14.2 % (ref 11.5–15.5)
WBC: 7.4 10*3/uL (ref 4.0–10.5)

## 2023-03-01 MED ORDER — SPIRONOLACTONE 100 MG PO TABS: 100.0000 mg | ORAL_TABLET | Freq: Every day | ORAL | 3 refills | Status: DC

## 2023-03-01 MED ORDER — FUROSEMIDE 40 MG PO TABS
40.0000 mg | ORAL_TABLET | Freq: Every day | ORAL | 3 refills | Status: DC
Start: 2023-03-01 — End: 2023-06-21

## 2023-03-01 NOTE — Patient Instructions (Signed)
Your provider has requested that you go to the basement level for lab work before leaving today. Press "B" on the elevator. The lab is located at the first door on the left as you exit the elevator.   Go to [facility] next week and get your BMP drawn, we will fax your order to them  We have sent the following medications to your pharmacy for you to pick up at your convenience: Aldactone Lasix   Due to recent changes in healthcare laws, you may see the results of your imaging and laboratory studies on MyChart before your provider has had a chance to review them.  We understand that in some cases there may be results that are confusing or concerning to you. Not all laboratory results come back in the same time frame and the provider may be waiting for multiple results in order to interpret others.  Please give [place] 48 hours in order for your provider to thoroughly review all the results before contacting the office for clarification of your results.    I appreciate the  opportunity to care for you  Thank You   Marsa Aris , MD

## 2023-03-01 NOTE — Progress Notes (Signed)
Rhonda [name]    [id number]    06-Mar-1956  Primary Care Physician:Brown, Lehman Prom, FNP  Referring Physician: Valerie Roys, FNP [address] [place],  [place] [address]   Chief complaint:  cirrhosis  HPI:  67 year old very pleasant female previously followed by Dr. Christella Hartigan is here for follow-up for cirrhosis  Her symptoms are stable. Denies any nausea, vomiting, abdominal pain, melena or bright red blood per rectum  Denies excessive alcohol use but she continues to drink occasionally  Other relevant medical history includes sleep apnea, CAD, CHF, A-fib on Eliquis  MR abd/MRCP 08/09/22 Stable 2.1 cm and 1.4 cm cystic lesions in the uncinate process of the pancreas. These likely represent indolent side-branch IPMNs. Recommend continued follow-up by MRI in 1 year.     Outpatient Encounter Medications as of 03/01/2023  Medication Sig   albuterol (PROVENTIL HFA;VENTOLIN HFA) 108 (90 Base) MCG/ACT inhaler Inhale 2 puffs into the lungs every 6 (six) hours as needed for wheezing or shortness of breath.   apixaban (ELIQUIS) 5 MG TABS tablet Take 1 tablet (5 mg total) by mouth 2 (two) times daily.   atorvastatin (LIPITOR) 20 MG tablet Take 1 tablet (20 mg total) by mouth every evening.   digoxin (LANOXIN) 0.125 MG tablet Take 0.5 tablets (0.0625 mg total) by mouth daily.   diltiazem (CARDIZEM CD) 360 MG 24 hr capsule TAKE 1 CAPSULE BY MOUTH EVERY DAY   furosemide (LASIX) 40 MG tablet TAKE 1 TABLET BY MOUTH EVERY DAY   Homeopathic Products [facility] MENTAL FOCUS PO) Take 2 capsules by mouth daily. [employer] MentaFocus   metoprolol tartrate (LOPRESSOR) 100 MG tablet Take 1 tablet (100 mg total) by mouth 2 (two) times daily.   NON FORMULARY Take 2 capsules by mouth daily. Mood+ by [employer] GLOBAL   pantoprazole (PROTONIX) 40 MG tablet Take 1 tablet (40 mg total) by mouth daily.   spironolactone (ALDACTONE) 50 MG tablet Take 1 tablet (50 mg total) by mouth  daily.   No facility-administered encounter medications on file as of 03/01/2023.    Allergies as of 03/01/2023 - Review Complete 03/01/2023  Allergen Reaction Noted   Ace inhibitors Cough 02/04/2016   Prednisone Itching and Swelling 07/03/2018    Past Medical History:  Diagnosis Date   Allergy    Atrial fibrillation (HCC)    CAD (coronary artery disease) 08/2009   BMS to RCA, non-obstructive in CX and LAD 2011   Cataract    CHF (congestive heart failure) (HCC)    Edema, peripheral    Hypertension    Myocardial infarction [facility])    Nausea    Pneumonia    January 2020   Sleep apnea    Trichomoniasis 06/05/2018    Past Surgical History:  Procedure Laterality Date   BIOPSY  08/06/2020   Procedure: BIOPSY;  Surgeon: Rachael Fee, MD;  Location: [facility];  Service: Gastroenterology;;   CESAREAN SECTION     x 1. no BTL   COLONOSCOPY WITH PROPOFOL N/A 08/06/2020   Procedure: COLONOSCOPY WITH PROPOFOL;  Surgeon: Rachael Fee, MD;  Location: [facility];  Service: Gastroenterology;  Laterality: N/A;   CORONARY ANGIOPLASTY WITH STENT PLACEMENT     DILATION AND CURETTAGE, DIAGNOSTIC / THERAPEUTIC     for SAB   ESOPHAGOGASTRODUODENOSCOPY (EGD) WITH PROPOFOL N/A 08/06/2020   Procedure: ESOPHAGOGASTRODUODENOSCOPY (EGD) WITH PROPOFOL;  Surgeon: Rachael Fee, MD;  Location: [facility];  Service: Gastroenterology;  Laterality: N/A;   HYSTEROSCOPY WITH D & C N/A 03/13/2019   Procedure: DILATATION AND CURETTAGE /HYSTEROSCOPY;  Surgeon: Sharon Bing, MD;  Location: [facility];  Service: Gynecology;  Laterality: N/A;   POLYPECTOMY  08/06/2020   Procedure: POLYPECTOMY;  Surgeon: Rachael Fee, MD;  Location: [facility];  Service: Gastroenterology;;    Family History  Problem Relation Age of Onset   CAD Mother    Diabetes Mellitus II Father    Colon cancer Father    Hypertension Sister    Breast cancer Paternal Aunt 33   CAD Maternal Grandmother     Kidney disease Son     Social History   Socioeconomic History   Marital status: Widowed    Spouse name: Not on file   Number of children: 3   Years of education: Not on file   Highest education level: Not on file  Occupational History   Not on file  Tobacco Use   Smoking status: Former    Current packs/day: 0.20    Types: Cigarettes   Smokeless tobacco: Never  Vaping Use   Vaping status: Never Used  Substance and Sexual Activity   Alcohol use: Yes    Alcohol/week: 5.0 standard drinks of alcohol    Types: 2 Cans of beer, 3 Standard drinks or equivalent per week    Comment: "drink at least 3 shots a day" has not drank since January 2024   Drug use: Yes    Types: Marijuana    Comment: nightly   Sexual activity: Yes    Birth control/protection: Post-menopausal  Other Topics Concern   Not on file  Social History Narrative   Not on file   Social Determinants of Health   Financial Resource Strain: Not on File (09/23/2021)   Received from [employer], [employer]    Financial Resource Strain: 0  Food Insecurity: Not on file (02/13/2023)  Transportation Needs: Not on File (09/23/2021)   Received from [facility], [employer] Needs    Transportation: 0  Physical Activity: Not on File (09/23/2021)   Received from [place], [place]   Physical Activity    Physical Activity: 0  Stress: Not on File (09/23/2021)   Received from [facility], [place]   Stress    Stress: 0  Social Connections: Not on File (02/26/2023)   Received from [facility]   Social Connections    Connectedness: 0  Intimate Partner Violence: Not on file      Review of systems: All other review of systems negative except as mentioned in the HPI.   Physical Exam: Vitals:   03/01/23 0823  BP: 110/60  Pulse: 82   Body mass index is 31.08 kg/m. Gen:      No acute distress HEENT:  sclera anicteric Abd:      soft, non-tender; no palpable masses, no distension Ext:    No edema Neuro: alert and  oriented x 3 Psych: normal mood and affect  Data Reviewed:  Reviewed labs, radiology imaging, old records and pertinent past GI work up   Assessment and Plan/Recommendations:  Cirrhosis.  Alcoholic.  2022 laboratory work-up: Anti-smooth muscle antibody negative, ANA negative, AMA negative, hepatitis A IgM negative, hepatitis C antibody negative, hepatitis B surface antigen negative, EGD April 2024: Small distal esophagus varices, mild portal gastropathy.   MELD-Na: 11 at 03/01/2023  9:13 AM Discussed alcohol cessation No evidence of hepatic encephalopathy     Adenomatous colon polyps.  Colonoscopy March 2022 found  2 subcentimeter adenomas.  Repeat colonoscopy at 7-year interval  Incidental pancreatic cyst, sidebranch IPMN follow-up MRCP in 1 year  This visit required 30 minutes of patient care (this includes precharting, chart review, review of results, face-to-face time used for counseling as well as treatment plan and follow-up. The patient was provided an opportunity to ask questions and all were answered. The patient agreed with the plan and demonstrated an understanding of the instructions.  Iona Beard , MD    CC: Valerie Roys

## 2023-03-03 ENCOUNTER — Ambulatory Visit (HOSPITAL_COMMUNITY): Payer: Medicare HMO

## 2023-03-06 ENCOUNTER — Other Ambulatory Visit: Payer: Self-pay

## 2023-03-06 ENCOUNTER — Encounter: Payer: Self-pay | Admitting: Internal Medicine

## 2023-03-06 ENCOUNTER — Encounter: Payer: Self-pay | Admitting: Gastroenterology

## 2023-03-06 ENCOUNTER — Ambulatory Visit: Payer: Medicare HMO | Attending: Internal Medicine | Admitting: Internal Medicine

## 2023-03-06 VITALS — BP 90/70 | HR 69 | Ht 61.0 in | Wt 169.0 lb

## 2023-03-06 DIAGNOSIS — Z79899 Other long term (current) drug therapy: Secondary | ICD-10-CM | POA: Diagnosis not present

## 2023-03-06 DIAGNOSIS — I361 Nonrheumatic tricuspid (valve) insufficiency: Secondary | ICD-10-CM

## 2023-03-06 DIAGNOSIS — I5032 Chronic diastolic (congestive) heart failure: Secondary | ICD-10-CM

## 2023-03-06 DIAGNOSIS — D6869 Other thrombophilia: Secondary | ICD-10-CM | POA: Diagnosis not present

## 2023-03-06 DIAGNOSIS — Z5181 Encounter for therapeutic drug level monitoring: Secondary | ICD-10-CM | POA: Diagnosis not present

## 2023-03-06 DIAGNOSIS — I340 Nonrheumatic mitral (valve) insufficiency: Secondary | ICD-10-CM | POA: Diagnosis not present

## 2023-03-06 DIAGNOSIS — I10 Essential (primary) hypertension: Secondary | ICD-10-CM | POA: Diagnosis not present

## 2023-03-06 DIAGNOSIS — I4821 Permanent atrial fibrillation: Secondary | ICD-10-CM

## 2023-03-06 DIAGNOSIS — I484 Atypical atrial flutter: Secondary | ICD-10-CM | POA: Diagnosis not present

## 2023-03-06 DIAGNOSIS — R600 Localized edema: Secondary | ICD-10-CM | POA: Diagnosis not present

## 2023-03-06 MED ORDER — PANTOPRAZOLE SODIUM 40 MG PO TBEC: 40.0000 mg | DELAYED_RELEASE_TABLET | Freq: Every day | ORAL | 0 refills | Status: DC

## 2023-03-06 NOTE — H&P (View-Only) (Signed)
Cardiology Office Note:  .   Date:  03/06/2023  ID:  Rhonda [name], Rhonda [name] 31-May-1956, MRN [id number] PCP: Valerie Roys, FNP  [facility] Providers Cardiologist:  Parke Poisson, MD    History of Present Illness: .   Rhonda [name] is a 67 y.o. female.  Discussed the use of AI scribe software for clinical note transcription with the patient, who gave verbal consent to proceed.  History of Present Illness   The patient, with a history of chronic diastolic congestive heart failure, coronary artery disease with prior PCI, hypertension, persistent atrial fibrillation, previous heavy alcohol abuse with cirrhosis and minimal varices, has been experiencing shortness of breath at night. This symptom has been present for a period of time before her husband's passing last year. The patient has lost 30 pounds since the last visit in June. On physical exam, a significant apical systolic murmur was noted and an echocardiogram revealed torrential mitral valve regurgitation. The patient is currently taking digoxin daily due to a slightly low dig level.        ROS: negative except per HPI above.  Studies Reviewed: .        Results   DIAGNOSTIC Echocardiogram: Torrential mitral valve regurgitation, severely dilated atria and severe TR.     Risk Assessment/Calculations:    CHA2DS2-VASc Score = 5   This indicates a 7.2% annual risk of stroke. The patient's score is based upon: CHF History: 1 HTN History: 1 Diabetes History: 0 Stroke History: 0 Vascular Disease History: 1 Age Score: 1 Gender Score: 1    Physical Exam:   VS:  BP 90/70   Pulse 69   Ht 5\' 1"  (1.549 m)   Wt 169 lb (76.7 kg)   SpO2 97%   BMI 31.93 kg/m    Wt Readings from Last 3 Encounters:  03/06/23 169 lb (76.7 kg)  03/01/23 164 lb 8 oz (74.6 kg)  11/28/22 167 lb 3.2 oz (75.8 kg)     Physical Exam   CHEST: Lungs clear to auscultation. CARDIOVASCULAR: Apical systolic murmur, 3/6  systolic murmur on the left sternal border and at the apex.     GEN: Well nourished, well developed in no acute distress NECK: No JVD; No carotid bruits ABDOMEN: Soft, non-tender, non-distended EXTREMITIES:  1+ edema; No deformity   ASSESSMENT AND PLAN: .    1. Permanent atrial fibrillation (HCC)   2. Atypical atrial flutter (HCC)   3. Secondary hypercoagulable state (HCC)   4. Medication management   5. Chronic diastolic HF (heart failure) (HCC)   6. Therapeutic drug monitoring   7. Lower extremity edema   8. Essential hypertension   9. Nonrheumatic mitral valve regurgitation   10. Nonrheumatic tricuspid valve regurgitation     Assessment and Plan    Mitral Valve Regurgitation Tricuspid valve regurgitation Torrential mitral valve regurgitation noted on echocardiogram, likely related to atrial functional MR given severely dilated LA. Patient has been experiencing nocturnal shortness of breath. Also has severe TR. -Schedule TEE to further investigate the mitral valve regurgitation. -Discuss findings with structural team to determine management options.  Atrial Fibrillation Persistent atrial fibrillation with dilated atria, contributing to mitral valve regurgitation. Previous cardioversions unsuccessful. -Continue current management, no further cardioversions planned.  Cirrhosis History of cirrhosis with minimal varices.  -Continue current management.  Gastroesophageal Reflux Disease Patient on Pantoprazole, needs refill. -Refill Pantoprazole for three months, advise patient to contact GI for further refills.  Follow-up Plan to review TEE  results with surgical team and discuss treatment options with patient. Follow-up appointment scheduled for after TEE.            Informed Consent   Shared Decision Making/Informed Consent The risks [esophageal damage, perforation (1:10,000 risk), bleeding, pharyngeal hematoma as well as other potential complications associated with  conscious sedation including aspiration, arrhythmia, respiratory failure and death], benefits (treatment guidance and diagnostic support) and alternatives of a transesophageal echocardiogram were discussed in detail with Rhonda [name] and she is willing to proceed.       Signed, Parke Poisson, MD

## 2023-03-06 NOTE — H&P (View-Only) (Signed)
Cardiology Office Note:  .   Date:  03/06/2023  ID:  Trany, Chernick 31-May-1956, MRN [id number] PCP: Valerie Roys, FNP  [facility] Providers Cardiologist:  Parke Poisson, MD    History of Present Illness: .   Rhonda [name] is a 67 y.o. female.  Discussed the use of AI scribe software for clinical note transcription with the patient, who gave verbal consent to proceed.  History of Present Illness   The patient, with a history of chronic diastolic congestive heart failure, coronary artery disease with prior PCI, hypertension, persistent atrial fibrillation, previous heavy alcohol abuse with cirrhosis and minimal varices, has been experiencing shortness of breath at night. This symptom has been present for a period of time before her husband's passing last year. The patient has lost 30 pounds since the last visit in June. On physical exam, a significant apical systolic murmur was noted and an echocardiogram revealed torrential mitral valve regurgitation. The patient is currently taking digoxin daily due to a slightly low dig level.        ROS: negative except per HPI above.  Studies Reviewed: .        Results   DIAGNOSTIC Echocardiogram: Torrential mitral valve regurgitation, severely dilated atria and severe TR.     Risk Assessment/Calculations:    CHA2DS2-VASc Score = 5   This indicates a 7.2% annual risk of stroke. The patient's score is based upon: CHF History: 1 HTN History: 1 Diabetes History: 0 Stroke History: 0 Vascular Disease History: 1 Age Score: 1 Gender Score: 1    Physical Exam:   VS:  BP 90/70   Pulse 69   Ht 5\' 1"  (1.549 m)   Wt 169 lb (76.7 kg)   SpO2 97%   BMI 31.93 kg/m    Wt Readings from Last 3 Encounters:  03/06/23 169 lb (76.7 kg)  03/01/23 164 lb 8 oz (74.6 kg)  11/28/22 167 lb 3.2 oz (75.8 kg)     Physical Exam   CHEST: Lungs clear to auscultation. CARDIOVASCULAR: Apical systolic murmur, 3/6  systolic murmur on the left sternal border and at the apex.     GEN: Well nourished, well developed in no acute distress NECK: No JVD; No carotid bruits ABDOMEN: Soft, non-tender, non-distended EXTREMITIES:  1+ edema; No deformity   ASSESSMENT AND PLAN: .    1. Permanent atrial fibrillation (HCC)   2. Atypical atrial flutter (HCC)   3. Secondary hypercoagulable state (HCC)   4. Medication management   5. Chronic diastolic HF (heart failure) (HCC)   6. Therapeutic drug monitoring   7. Lower extremity edema   8. Essential hypertension   9. Nonrheumatic mitral valve regurgitation   10. Nonrheumatic tricuspid valve regurgitation     Assessment and Plan    Mitral Valve Regurgitation Tricuspid valve regurgitation Torrential mitral valve regurgitation noted on echocardiogram, likely related to atrial functional MR given severely dilated LA. Patient has been experiencing nocturnal shortness of breath. Also has severe TR. -Schedule TEE to further investigate the mitral valve regurgitation. -Discuss findings with structural team to determine management options.  Atrial Fibrillation Persistent atrial fibrillation with dilated atria, contributing to mitral valve regurgitation. Previous cardioversions unsuccessful. -Continue current management, no further cardioversions planned.  Cirrhosis History of cirrhosis with minimal varices.  -Continue current management.  Gastroesophageal Reflux Disease Patient on Pantoprazole, needs refill. -Refill Pantoprazole for three months, advise patient to contact GI for further refills.  Follow-up Plan to review TEE  results with surgical team and discuss treatment options with patient. Follow-up appointment scheduled for after TEE.            Informed Consent   Shared Decision Making/Informed Consent The risks [esophageal damage, perforation (1:10,000 risk), bleeding, pharyngeal hematoma as well as other potential complications associated with  conscious sedation including aspiration, arrhythmia, respiratory failure and death], benefits (treatment guidance and diagnostic support) and alternatives of a transesophageal echocardiogram were discussed in detail with Ms. Rhonda [name] and she is willing to proceed.       Signed, Parke Poisson, MD

## 2023-03-06 NOTE — Patient Instructions (Addendum)
Medication Instructions:  Your physician recommends that you continue on your current medications as directed. Please refer to the Current Medication list given to you today.  *If you need a refill on your cardiac medications before your next appointment, please call your pharmacy*   Testing/Procedures: See below   Follow-Up: At [facility], you and your health needs are our priority.  As part of our continuing mission to provide you with exceptional heart care, we have created designated Provider Care Teams.  These Care Teams include your primary Cardiologist (physician) and Advanced Practice Providers (APPs -  Physician Assistants and Nurse Practitioners) who all work together to provide you with the care you need, when you need it.  We recommend signing up for the patient portal called "MyChart".  Sign up information is provided on this After Visit Summary.  MyChart is used to connect with patients for Virtual Visits (Telemedicine).  Patients are able to view lab/test results, encounter notes, upcoming appointments, etc.  Non-urgent messages can be sent to your provider as well.   To learn more about what you can do with MyChart, go to [email or web address].    Your next appointment:   3 month(s)  Provider:   Parke Poisson, MD     Other Instructions   You are scheduled for a TEE (Transesophageal Echocardiogram) on Tuesday, October 2 with Dr. Jacques Navy.  Please arrive at the [facility] (Main Entrance A) at [facility]: [address] [place], [place] [address] at 12:00 PM (This time is 1 hour(s) before your procedure to ensure your preparation). Free valet parking service is available. You will check in at ADMITTING. The support person will be asked to wait in the waiting room.  It is OK to have someone drop you off and come back when you are ready to be discharged.      DIET:  Nothing to eat or drink after midnight except a sip of water with medications (see  medication instructions below)  MEDICATION INSTRUCTIONS: !!IF ANY NEW MEDICATIONS ARE STARTED AFTER TODAY, PLEASE NOTIFY YOUR PROVIDER AS SOON AS POSSIBLE!!  FYI: Medications such as Semaglutide (Ozempic, [place]), Tirzepatide (Mounjaro, Zepbound), Dulaglutide (Trulicity), etc ("GLP1 agonists") AND Canagliflozin (Invokana), Dapagliflozin (Farxiga), Empagliflozin (Jardiance), Ertugliflozin (Steglatro), Bexagliflozin [employer]) or any combination with one of these drugs such as Invokamet (Canagliflozin/Metformin), Synjardy (Empagliflozin/Metformin), etc ("SGLT2 inhibitors") must be held around the time of a procedure. This is not a comprehensive list of all of these drugs. Please review all of your medications and talk to your provider if you take any one of these. If you are not sure, ask your provider.    -Hold Spironolactone and Lasix on the day of your procedure.     Continue taking your anticoagulant (blood thinner): Apixaban (Eliquis).  You will need to continue this after your procedure until you are told by your provider that it is safe to stop.     FYI:  For your safety, and to allow [place] to monitor your vital signs accurately during the surgery/procedure we request: If you have artificial nails, gel coating, SNS etc, please have those removed prior to your surgery/procedure. Not having the nail coverings /polish removed may result in cancellation or delay of your surgery/procedure.  You must have a responsible person to drive you home and stay in the waiting area during your procedure. Failure to do so could result in cancellation.  Bring your insurance cards.  *Special Note: Every effort is made to have your procedure  done on time. Occasionally there are emergencies that occur at the hospital that may cause delays. Please be patient if a delay does occur.

## 2023-03-06 NOTE — Progress Notes (Signed)
Cardiology Office Note:  .   Date:  03/06/2023  ID:  Trany, Chernick 31-May-1956, MRN [id number] PCP: Valerie Roys, FNP  [facility] Providers Cardiologist:  Parke Poisson, MD    History of Present Illness: .   Rhonda [name] is a 67 y.o. female.  Discussed the use of AI scribe software for clinical note transcription with the patient, who gave verbal consent to proceed.  History of Present Illness   The patient, with a history of chronic diastolic congestive heart failure, coronary artery disease with prior PCI, hypertension, persistent atrial fibrillation, previous heavy alcohol abuse with cirrhosis and minimal varices, has been experiencing shortness of breath at night. This symptom has been present for a period of time before her husband's passing last year. The patient has lost 30 pounds since the last visit in June. On physical exam, a significant apical systolic murmur was noted and an echocardiogram revealed torrential mitral valve regurgitation. The patient is currently taking digoxin daily due to a slightly low dig level.        ROS: negative except per HPI above.  Studies Reviewed: .        Results   DIAGNOSTIC Echocardiogram: Torrential mitral valve regurgitation, severely dilated atria and severe TR.     Risk Assessment/Calculations:    CHA2DS2-VASc Score = 5   This indicates a 7.2% annual risk of stroke. The patient's score is based upon: CHF History: 1 HTN History: 1 Diabetes History: 0 Stroke History: 0 Vascular Disease History: 1 Age Score: 1 Gender Score: 1    Physical Exam:   VS:  BP 90/70   Pulse 69   Ht 5\' 1"  (1.549 m)   Wt 169 lb (76.7 kg)   SpO2 97%   BMI 31.93 kg/m    Wt Readings from Last 3 Encounters:  03/06/23 169 lb (76.7 kg)  03/01/23 164 lb 8 oz (74.6 kg)  11/28/22 167 lb 3.2 oz (75.8 kg)     Physical Exam   CHEST: Lungs clear to auscultation. CARDIOVASCULAR: Apical systolic murmur, 3/6  systolic murmur on the left sternal border and at the apex.     GEN: Well nourished, well developed in no acute distress NECK: No JVD; No carotid bruits ABDOMEN: Soft, non-tender, non-distended EXTREMITIES:  1+ edema; No deformity   ASSESSMENT AND PLAN: .    1. Permanent atrial fibrillation (HCC)   2. Atypical atrial flutter (HCC)   3. Secondary hypercoagulable state (HCC)   4. Medication management   5. Chronic diastolic HF (heart failure) (HCC)   6. Therapeutic drug monitoring   7. Lower extremity edema   8. Essential hypertension   9. Nonrheumatic mitral valve regurgitation   10. Nonrheumatic tricuspid valve regurgitation     Assessment and Plan    Mitral Valve Regurgitation Tricuspid valve regurgitation Torrential mitral valve regurgitation noted on echocardiogram, likely related to atrial functional MR given severely dilated LA. Patient has been experiencing nocturnal shortness of breath. Also has severe TR. -Schedule TEE to further investigate the mitral valve regurgitation. -Discuss findings with structural team to determine management options.  Atrial Fibrillation Persistent atrial fibrillation with dilated atria, contributing to mitral valve regurgitation. Previous cardioversions unsuccessful. -Continue current management, no further cardioversions planned.  Cirrhosis History of cirrhosis with minimal varices.  -Continue current management.  Gastroesophageal Reflux Disease Patient on Pantoprazole, needs refill. -Refill Pantoprazole for three months, advise patient to contact GI for further refills.  Follow-up Plan to review TEE  results with surgical team and discuss treatment options with patient. Follow-up appointment scheduled for after TEE.            Informed Consent   Shared Decision Making/Informed Consent The risks [esophageal damage, perforation (1:10,000 risk), bleeding, pharyngeal hematoma as well as other potential complications associated with  conscious sedation including aspiration, arrhythmia, respiratory failure and death], benefits (treatment guidance and diagnostic support) and alternatives of a transesophageal echocardiogram were discussed in detail with Rhonda [name] and she is willing to proceed.       Signed, Parke Poisson, MD

## 2023-03-07 NOTE — Progress Notes (Signed)
Spoke to patient and instructed them to come at 10:30  and to be NPO after 0000. Patient is not sure her driver can come earlier but will try. Reviewed medications and advised to take am meds with a sip of water but hold diuretics.    Confirmed that patient will have a ride home and someone to stay with them for 24 hours after the procedure.

## 2023-03-08 ENCOUNTER — Ambulatory Visit (HOSPITAL_COMMUNITY): Payer: Medicare HMO | Admitting: Anesthesiology

## 2023-03-08 ENCOUNTER — Ambulatory Visit (HOSPITAL_COMMUNITY): Payer: Medicare HMO

## 2023-03-08 ENCOUNTER — Other Ambulatory Visit: Payer: Self-pay

## 2023-03-08 ENCOUNTER — Ambulatory Visit (HOSPITAL_COMMUNITY)
Admission: RE | Admit: 2023-03-08 | Discharge: 2023-03-08 | Disposition: A | Discharge status: Home / Self Care | Payer: Medicare HMO | Attending: Internal Medicine | Admitting: Internal Medicine

## 2023-03-08 ENCOUNTER — Encounter (HOSPITAL_COMMUNITY)
Admission: RE | Disposition: A | Discharge status: Home / Self Care | Payer: Self-pay | Source: Home / Self Care | Attending: Internal Medicine

## 2023-03-08 DIAGNOSIS — I361 Nonrheumatic tricuspid (valve) insufficiency: Secondary | ICD-10-CM | POA: Diagnosis not present

## 2023-03-08 DIAGNOSIS — Z79899 Other long term (current) drug therapy: Secondary | ICD-10-CM | POA: Insufficient documentation

## 2023-03-08 DIAGNOSIS — K219 Gastro-esophageal reflux disease without esophagitis: Secondary | ICD-10-CM | POA: Insufficient documentation

## 2023-03-08 DIAGNOSIS — I110 Hypertensive heart disease with heart failure: Secondary | ICD-10-CM | POA: Diagnosis not present

## 2023-03-08 DIAGNOSIS — I700 Atherosclerosis of aorta: Secondary | ICD-10-CM | POA: Diagnosis not present

## 2023-03-08 DIAGNOSIS — R0602 Shortness of breath: Secondary | ICD-10-CM | POA: Insufficient documentation

## 2023-03-08 DIAGNOSIS — I3139 Other pericardial effusion (noninflammatory): Secondary | ICD-10-CM | POA: Insufficient documentation

## 2023-03-08 DIAGNOSIS — K7460 Unspecified cirrhosis of liver: Secondary | ICD-10-CM | POA: Diagnosis not present

## 2023-03-08 DIAGNOSIS — I5033 Acute on chronic diastolic (congestive) heart failure: Secondary | ICD-10-CM | POA: Diagnosis not present

## 2023-03-08 DIAGNOSIS — I081 Rheumatic disorders of both mitral and tricuspid valves: Secondary | ICD-10-CM | POA: Diagnosis not present

## 2023-03-08 DIAGNOSIS — I2510 Atherosclerotic heart disease of native coronary artery without angina pectoris: Secondary | ICD-10-CM | POA: Insufficient documentation

## 2023-03-08 DIAGNOSIS — I5032 Chronic diastolic (congestive) heart failure: Secondary | ICD-10-CM | POA: Insufficient documentation

## 2023-03-08 DIAGNOSIS — D6869 Other thrombophilia: Secondary | ICD-10-CM | POA: Diagnosis not present

## 2023-03-08 DIAGNOSIS — I340 Nonrheumatic mitral (valve) insufficiency: Secondary | ICD-10-CM | POA: Diagnosis not present

## 2023-03-08 DIAGNOSIS — Z87891 Personal history of nicotine dependence: Secondary | ICD-10-CM | POA: Diagnosis not present

## 2023-03-08 DIAGNOSIS — Z955 Presence of coronary angioplasty implant and graft: Secondary | ICD-10-CM | POA: Diagnosis not present

## 2023-03-08 DIAGNOSIS — I4821 Permanent atrial fibrillation: Secondary | ICD-10-CM | POA: Insufficient documentation

## 2023-03-08 DIAGNOSIS — R600 Localized edema: Secondary | ICD-10-CM | POA: Insufficient documentation

## 2023-03-08 DIAGNOSIS — I484 Atypical atrial flutter: Secondary | ICD-10-CM | POA: Insufficient documentation

## 2023-03-08 HISTORY — PX: TEE WITHOUT CARDIOVERSION: SHX5443

## 2023-03-08 LAB — ECHO TEE
MV M vel: 5.06 m/s
MV Peak grad: 102.4 mm[Hg]
Radius: 1.6 cm

## 2023-03-08 SURGERY — ECHOCARDIOGRAM, TRANSESOPHAGEAL
Anesthesia: Monitor Anesthesia Care

## 2023-03-08 MED ORDER — PHENYLEPHRINE HCL-NACL 20-0.9 MG/250ML-% IV SOLN
INTRAVENOUS | Status: DC | PRN
  Administered 2023-03-08: 50 ug/min via INTRAVENOUS

## 2023-03-08 MED ORDER — PROPOFOL 10 MG/ML IV BOLUS
INTRAVENOUS | Status: DC | PRN
Start: 2023-03-08 — End: 2023-03-08
  Administered 2023-03-08: 50 mg via INTRAVENOUS

## 2023-03-08 MED ORDER — PROPOFOL 500 MG/50ML IV EMUL
INTRAVENOUS | Status: DC | PRN
  Administered 2023-03-08: 125 ug/kg/min via INTRAVENOUS

## 2023-03-08 MED ORDER — PHENYLEPHRINE 80 MCG/ML (10ML) SYRINGE FOR IV PUSH (FOR BLOOD PRESSURE SUPPORT)
PREFILLED_SYRINGE | INTRAVENOUS | Status: DC | PRN
  Administered 2023-03-08: 80 ug via INTRAVENOUS

## 2023-03-08 MED ORDER — ONDANSETRON HCL 4 MG/2ML IJ SOLN
INTRAMUSCULAR | Status: DC | PRN
  Administered 2023-03-08: 4 mg via INTRAVENOUS

## 2023-03-08 MED ORDER — SODIUM CHLORIDE 0.9 % IV SOLN: INTRAVENOUS | Status: DC

## 2023-03-08 NOTE — Anesthesia Postprocedure Evaluation (Signed)
Anesthesia Post Note  Patient: Avana Kreiser  Procedure(s) Performed: TRANSESOPHAGEAL ECHOCARDIOGRAM     Patient location during evaluation: Cath Lab Anesthesia Type: MAC Level of consciousness: awake and alert, patient cooperative and oriented Pain management: pain level controlled Vital Signs Assessment: post-procedure vital signs reviewed and stable Respiratory status: spontaneous breathing, nonlabored ventilation and respiratory function stable Cardiovascular status: blood pressure returned to baseline and stable Postop Assessment: no apparent nausea or vomiting Anesthetic complications: no   No notable events documented.  Last Vitals:  Vitals:   03/08/23 1255 03/08/23 1300  BP: (!) 99/59 102/63  Pulse:    Resp: 16 16  Temp:    SpO2: 92%     Last Pain:  Vitals:   03/08/23 1232  TempSrc: Temporal  PainSc: 0-No pain                 Owynn Mosqueda,E. Amisha Pospisil

## 2023-03-08 NOTE — Anesthesia Preprocedure Evaluation (Addendum)
Anesthesia Evaluation  Patient identified by MRN, date of birth, ID band Patient awake    Reviewed: Allergy & Precautions, NPO status , Patient's Chart, lab work & pertinent test results, reviewed documented beta blocker date and time   History of Anesthesia Complications Negative for: history of anesthetic complications  Airway Mallampati: I  TM Distance: >3 FB Neck ROM: Full    Dental  (+) Missing, Chipped, Dental Advisory Given   Pulmonary shortness of breath, COPD,  COPD inhaler, Patient abstained from smoking., former smoker   breath sounds clear to auscultation       Cardiovascular hypertension, Pt. on medications and Pt. on home beta blockers + CAD, + Past MI, + Cardiac Stents and + Orthopnea  + dysrhythmias Atrial Fibrillation  Rhythm:Irregular Rate:Normal  12/2022 ECHO: EF 50-55%, low normal LVF, normal RVF, mod pulm HTN, severe functional MR, severe TR   Neuro/Psych negative neurological ROS     GI/Hepatic ,GERD  Medicated and Controlled,,(+) Cirrhosis   ascites      Endo/Other  negative endocrine ROS    Renal/GU negative Renal ROS     Musculoskeletal   Abdominal   Peds  Hematology eliquis   Anesthesia Other Findings   Reproductive/Obstetrics                              Anesthesia Physical Anesthesia Plan  ASA: 4  Anesthesia Plan: MAC   Post-op Pain Management: Minimal or no pain anticipated   Induction:   PONV Risk Score and Plan: 2 and Treatment may vary due to age or medical condition  Airway Management Planned: Nasal Cannula and Natural Airway  Additional Equipment: None  Intra-op Plan:   Post-operative Plan:   Informed Consent: I have reviewed the patients History and Physical, chart, labs and discussed the procedure including the risks, benefits and alternatives for the proposed anesthesia with the patient or authorized representative who has indicated  his/her understanding and acceptance.     Dental advisory given  Plan Discussed with: CRNA and Surgeon  Anesthesia Plan Comments:         Anesthesia Quick Evaluation

## 2023-03-08 NOTE — Transfer of Care (Signed)
Immediate Anesthesia Transfer of Care Note  Patient: Rhonda [name]  Procedure(s) Performed: TRANSESOPHAGEAL ECHOCARDIOGRAM  Patient Location: Cath Lab  Anesthesia Type:MAC  Level of Consciousness: awake, alert , oriented, patient cooperative, and responds to stimulation  Airway & Oxygen Therapy: Patient Spontanous Breathing and Patient connected to face mask oxygen  Post-op Assessment: Report given to RN and Post -op Vital signs reviewed and stable  Post vital signs: Reviewed and stable  Last Vitals:  Vitals Value Taken Time  BP 88/58 03/08/23 1232  Temp 36.3 C 03/08/23 1232  Pulse 75 03/08/23 1232  Resp 16 03/08/23 1232  SpO2 97% 03/08/23 1235      Last Pain:  Vitals:   03/08/23 1232  TempSrc: Temporal  PainSc: 0-No pain         Complications: No notable events documented.

## 2023-03-08 NOTE — CV Procedure (Signed)
INDICATIONS: MR, TR  PROCEDURE:   Informed consent was obtained prior to the procedure. The risks, benefits and alternatives for the procedure were discussed and the patient comprehended these risks.  Risks include, but are not limited to, cough, sore throat, vomiting, nausea, somnolence, esophageal and stomach trauma or perforation, bleeding, low blood pressure, aspiration, pneumonia, infection, trauma to the teeth and death.    After a procedural time-out, the oropharynx was anesthetized with 20% benzocaine spray.   During this procedure the patient was administered propofol per anesthesia.  The patient's heart rate, blood pressure, and oxygen saturation were monitored continuously during the procedure. The period of conscious sedation was 20 minutes, of which I was present face-to-face 100% of this time.  The transesophageal probe was inserted in the esophagus and stomach without difficulty and multiple views were obtained.  The patient was kept under observation until the patient left the procedure room.  The patient left the procedure room in stable condition.   Agitated microbubble saline contrast was administered.  COMPLICATIONS:    There were no immediate complications.  FINDINGS:   FORMAL ECHOCARDIOGRAM REPORT PENDING Severe left atrial dilation.  Severe MR, mechanism appears to be atrial functional.  Moderate-severe TR  RECOMMENDATIONS:     Will review images with structural heart team 03/14/23.  Time Spent Directly with the Patient:  45 minutes   Parke Poisson 03/08/2023, 1:09 PM

## 2023-03-08 NOTE — Interval H&P Note (Signed)
History and Physical Interval Note:  03/08/2023 11:50 AM  Rhonda [name]  has presented today for surgery, with the diagnosis of MR.  The various methods of treatment have been discussed with the patient and family. After consideration of risks, benefits and other options for treatment, the patient has consented to  Procedure(s): TRANSESOPHAGEAL ECHOCARDIOGRAM (N/A) as a surgical intervention.  The patient's history has been reviewed, patient examined, no change in status, stable for surgery.  I have reviewed the patient's chart and labs.  Questions were answered to the patient's satisfaction.     Parke Poisson

## 2023-03-08 NOTE — Discharge Instructions (Addendum)
Transesophageal Echocardiogram Transesophageal echocardiogram (TEE) is a test that uses sound waves to take pictures of your heart. TEE is done by passing a small probe attached to a flexible tube down the part of the body that moves food from your mouth to your stomach (esophagus). The pictures give clear images of your heart. This can help your doctor see if there are problems with your heart. Tell a doctor about: Any allergies you have. All medicines you are taking. This includes vitamins, herbs, eye drops, creams, and over-the-counter medicines. Any problems you or family members have had with anesthetic medicines. Any blood disorders you have. Any surgeries you have had. Any medical conditions you have. Any swallowing problems. Whether you have or have had a blockage in the part of the body that moves food from your mouth to your stomach. Whether you are pregnant or may be pregnant. What are the risks? In general, this is a safe procedure. But, problems may occur, such as: Damage to nearby structures or organs. A tear in the part of the body that moves food from your mouth to your stomach. Irregular heartbeat. Hoarse voice or trouble swallowing. Bleeding. What happens before the procedure? Medicines Ask your doctor about changing or stopping: Your normal medicines. Vitamins, herbs, and supplements. Over-the-counter medicines. Do not take aspirin or ibuprofen unless you are told to. General instructions Follow instructions from your doctor about what you cannot eat or drink. You will take out any dentures or dental retainers. Plan to have a responsible adult take you home from the hospital or clinic. Plan to have a responsible adult care for you for the time you are told after you leave the hospital or clinic. This is important. What happens during the procedure?  An IV will be put into one of your veins. You may be given: A sedative. This medicine helps you relax. A medicine  to numb the back of your throat. This may be sprayed or gargled. Your blood pressure, heart rate, and breathing will be watched. You may be asked to lie on your left side. A bite block will be placed in your mouth. This keeps you from biting the tube. The tip of the probe will be placed into the back of your mouth. You will be asked to swallow. Your doctor will take pictures of your heart. The probe and bite block will be taken out after the test is done. The procedure may vary among doctors and hospitals. What can I expect after the procedure? You will be monitored until you leave the hospital or clinic. This includes checking your blood pressure, heart rate, breathing rate, and blood oxygen level. Your throat may feel sore and numb. This will get better over time. You will not be allowed to eat or drink until the numbness has gone away. It is common to have a sore throat for a day or two. It is up to you to get the results of your procedure. Ask how to get your results when they are ready. Follow these instructions at home: If you were given a sedative during your procedure, do not drive or use machines until your doctor says that it is safe. Return to your normal activities when your doctor says that it is safe. Keep all follow-up visits. Summary TEE is a test that uses sound waves to take pictures of your heart. You will be given a medicine to help you relax. Do not drive or use machines until your doctor says that it  is safe. This information is not intended to replace advice given to you by your health care provider. Make sure you discuss any questions you have with your health care provider. Document Revised: 02/03/2021 Document Reviewed: 01/14/2020 Elsevier Patient Education  2024 [employer].

## 2023-03-09 ENCOUNTER — Encounter (HOSPITAL_COMMUNITY): Payer: Self-pay | Admitting: Internal Medicine

## 2023-03-14 ENCOUNTER — Telehealth: Payer: Self-pay

## 2023-03-14 DIAGNOSIS — Z5181 Encounter for therapeutic drug level monitoring: Secondary | ICD-10-CM | POA: Diagnosis not present

## 2023-03-14 NOTE — Telephone Encounter (Signed)
Reviewed the patient's imaging in Valve Team meeting today. While the mitral valve appears clippable (would likely be two clips), the patient would most likely have moderate MR post-operatively. The team recommends surgical evaluation with Dr. Leafy Ro. If the patient is NOT a surgical candidate, mTEER will be considered with potential subsequent treatment of tricuspid valve in the future.

## 2023-03-15 LAB — DIGOXIN LEVEL: Digoxin, Serum: 0.4 ng/mL — ABNORMAL LOW (ref 0.5–0.9)

## 2023-03-20 ENCOUNTER — Encounter: Payer: Self-pay | Admitting: Internal Medicine

## 2023-03-20 DIAGNOSIS — I361 Nonrheumatic tricuspid (valve) insufficiency: Secondary | ICD-10-CM

## 2023-03-20 DIAGNOSIS — I340 Nonrheumatic mitral (valve) insufficiency: Secondary | ICD-10-CM

## 2023-03-28 ENCOUNTER — Other Ambulatory Visit: Payer: Self-pay

## 2023-03-28 DIAGNOSIS — I340 Nonrheumatic mitral (valve) insufficiency: Secondary | ICD-10-CM

## 2023-03-28 DIAGNOSIS — I4821 Permanent atrial fibrillation: Secondary | ICD-10-CM

## 2023-03-28 DIAGNOSIS — I361 Nonrheumatic tricuspid (valve) insufficiency: Secondary | ICD-10-CM

## 2023-03-28 NOTE — Telephone Encounter (Signed)
        Cardiac/Peripheral Catheterization   You are scheduled for a Cardiac Catheterization on Friday, October 25 with Dr. Cristal Deer End.  1. Please arrive at the [facility] Entrance of [facility], [address], [place] [address] at 6:30 AM (This is 1 hour(s) prior to your procedure time).  Proceed to the Check-In Desk directly inside the entrance.  Procedure Parking: Use the entrance off of the [facility] [address] side of the hospital. Turn right upon entering and follow the driveway to parking that is directly in front of the [facility] Center. There is no valet parking available at this entrance, however there is an awning directly in front of the [facility] Center for drop off/ pick up for patients.      Special note: Every effort is made to have your procedure done on time. Please understand that emergencies sometimes delay scheduled procedures.  2. Diet: Do not eat solid foods after midnight.  You may have clear liquids until 5 AM the day of the procedure.  3. Labs: You will need to have blood drawn on 03/01/23  4. Medication instructions in preparation for your procedure:   Stop taking Eliquis (Apixiban) on Wednesday, October 23.  Stop taking, Lasix (Furosemide)  and Spironolactone Friday, October 25,   On the morning of your procedure, take Aspirin 81 mg and any morning medicines NOT listed above.  You may use sips of water.  5. Plan to go home the same day, you will only stay overnight if medically necessary. 6. You MUST have a responsible adult to drive you home. 7. An adult MUST be with you the first 24 hours after you arrive home. 8. Bring a current list of your medications, and the last time and date medication taken. 9. Bring ID and current insurance cards. 10.Please wear clothes that are easy to get on and off and wear slip-on shoes.  Thank you for allowing [place] to care for you!   -- [facility]

## 2023-03-28 NOTE — Telephone Encounter (Signed)
Parke Poisson, MD  You5 days ago    I called the patient, she would like her right and left heart cath to be done at [facility], I consented her, please reach out to her and scheduled next available.  INFORMED CONSENT: I have reviewed the risks, indications, and alternatives to cardiac catheterization, possible angioplasty, and stenting with the patient. Risks include but are not limited to bleeding, infection, vascular injury, stroke, myocardial infarction, arrhythmia, kidney injury, radiation-related injury in the case of prolonged fluoroscopy use, emergency cardiac surgery, and death. The patient understands the risks of serious complication is 1-2 in 1000 with diagnostic cardiac cath and 1-2% or less with angioplasty/stenting.  INFORMED CONSENT: I have reviewed the risks, indications, and alternatives to right heart catheterization with the patient. Risks include but are not limited to bleeding, infection, vascular injury, stroke, myocardial infarction, arrhythmia, kidney injury, radiation-related injury in the case of prolonged fluoroscopy use, emergency cardiac surgery, and death. The patient understands the risks of serious complication is 1-2 in 1000 with diagnostic cardiac cath, and is willing to proceed.

## 2023-03-29 ENCOUNTER — Encounter: Payer: Self-pay | Admitting: Internal Medicine

## 2023-03-31 ENCOUNTER — Encounter: Payer: Self-pay | Admitting: Internal Medicine

## 2023-03-31 ENCOUNTER — Encounter
Admission: RE | Disposition: A | Discharge status: Home / Self Care | Payer: Self-pay | Source: Home / Self Care | Attending: Internal Medicine

## 2023-03-31 ENCOUNTER — Other Ambulatory Visit: Payer: Self-pay

## 2023-03-31 ENCOUNTER — Ambulatory Visit
Admission: RE | Admit: 2023-03-31 | Discharge: 2023-03-31 | Disposition: A | Discharge status: Home / Self Care | Payer: Medicare HMO | Attending: Internal Medicine | Admitting: Internal Medicine

## 2023-03-31 DIAGNOSIS — K219 Gastro-esophageal reflux disease without esophagitis: Secondary | ICD-10-CM | POA: Insufficient documentation

## 2023-03-31 DIAGNOSIS — I4821 Permanent atrial fibrillation: Secondary | ICD-10-CM | POA: Diagnosis not present

## 2023-03-31 DIAGNOSIS — I081 Rheumatic disorders of both mitral and tricuspid valves: Secondary | ICD-10-CM | POA: Diagnosis not present

## 2023-03-31 DIAGNOSIS — Z955 Presence of coronary angioplasty implant and graft: Secondary | ICD-10-CM | POA: Diagnosis not present

## 2023-03-31 DIAGNOSIS — Z79899 Other long term (current) drug therapy: Secondary | ICD-10-CM | POA: Diagnosis not present

## 2023-03-31 DIAGNOSIS — I361 Nonrheumatic tricuspid (valve) insufficiency: Secondary | ICD-10-CM

## 2023-03-31 DIAGNOSIS — I2584 Coronary atherosclerosis due to calcified coronary lesion: Secondary | ICD-10-CM | POA: Insufficient documentation

## 2023-03-31 DIAGNOSIS — D6869 Other thrombophilia: Secondary | ICD-10-CM | POA: Diagnosis not present

## 2023-03-31 DIAGNOSIS — I2510 Atherosclerotic heart disease of native coronary artery without angina pectoris: Secondary | ICD-10-CM | POA: Insufficient documentation

## 2023-03-31 DIAGNOSIS — R0600 Dyspnea, unspecified: Secondary | ICD-10-CM | POA: Insufficient documentation

## 2023-03-31 DIAGNOSIS — I5032 Chronic diastolic (congestive) heart failure: Secondary | ICD-10-CM | POA: Diagnosis not present

## 2023-03-31 DIAGNOSIS — I110 Hypertensive heart disease with heart failure: Secondary | ICD-10-CM | POA: Diagnosis not present

## 2023-03-31 DIAGNOSIS — I484 Atypical atrial flutter: Secondary | ICD-10-CM | POA: Diagnosis not present

## 2023-03-31 DIAGNOSIS — I340 Nonrheumatic mitral (valve) insufficiency: Secondary | ICD-10-CM | POA: Diagnosis present

## 2023-03-31 DIAGNOSIS — K7460 Unspecified cirrhosis of liver: Secondary | ICD-10-CM | POA: Diagnosis not present

## 2023-03-31 HISTORY — PX: RIGHT/LEFT HEART CATH AND CORONARY ANGIOGRAPHY: CATH118266

## 2023-03-31 LAB — POCT I-STAT EG7
Acid-Base Excess: 2 mmol/L (ref 0.0–2.0)
Bicarbonate: 27.8 mmol/L (ref 20.0–28.0)
Calcium, Ion: 1.29 mmol/L (ref 1.15–1.40)
HCT: 37 % (ref 36.0–46.0)
Hemoglobin: 12.6 g/dL (ref 12.0–15.0)
O2 Saturation: 63 %
Potassium: 3.9 mmol/L (ref 3.5–5.1)
Sodium: 139 mmol/L (ref 135–145)
TCO2: 29 mmol/L (ref 22–32)
pCO2, Ven: 49.9 mm[Hg] (ref 44–60)
pH, Ven: 7.355 (ref 7.25–7.43)
pO2, Ven: 35 mm[Hg] (ref 32–45)

## 2023-03-31 LAB — POCT I-STAT 7, (LYTES, BLD GAS, ICA,H+H)
Acid-Base Excess: 0 mmol/L (ref 0.0–2.0)
Bicarbonate: 26.3 mmol/L (ref 20.0–28.0)
Calcium, Ion: 1.29 mmol/L (ref 1.15–1.40)
HCT: 37 % (ref 36.0–46.0)
Hemoglobin: 12.6 g/dL (ref 12.0–15.0)
O2 Saturation: 89 %
Potassium: 4 mmol/L (ref 3.5–5.1)
Sodium: 139 mmol/L (ref 135–145)
TCO2: 28 mmol/L (ref 22–32)
pCO2 arterial: 46.4 mm[Hg] (ref 32–48)
pH, Arterial: 7.361 (ref 7.35–7.45)
pO2, Arterial: 59 mm[Hg] — ABNORMAL LOW (ref 83–108)

## 2023-03-31 SURGERY — RIGHT/LEFT HEART CATH AND CORONARY ANGIOGRAPHY
Anesthesia: Moderate Sedation | Laterality: Bilateral

## 2023-03-31 MED ORDER — SODIUM CHLORIDE 0.9% FLUSH: 3.0000 mL | INTRAVENOUS | Status: DC | PRN

## 2023-03-31 MED ORDER — ACETAMINOPHEN 325 MG PO TABS: 650.0000 mg | ORAL_TABLET | ORAL | Status: DC | PRN

## 2023-03-31 MED ORDER — HYDRALAZINE HCL 20 MG/ML IJ SOLN: 10.0000 mg | INTRAMUSCULAR | Status: DC | PRN

## 2023-03-31 MED ORDER — HEPARIN SODIUM (PORCINE) 1000 UNIT/ML IJ SOLN
INTRAMUSCULAR | Status: AC
Start: 2023-03-31 — End: ?
  Filled 2023-03-31: qty 10

## 2023-03-31 MED ORDER — SODIUM CHLORIDE 0.9 % IV SOLN: INTRAVENOUS | Status: DC

## 2023-03-31 MED ORDER — VERAPAMIL HCL 2.5 MG/ML IV SOLN
INTRAVENOUS | Status: AC
  Filled 2023-03-31: qty 2

## 2023-03-31 MED ORDER — IOHEXOL 300 MG/ML  SOLN
INTRAMUSCULAR | Status: DC | PRN
  Administered 2023-03-31: 31 mL

## 2023-03-31 MED ORDER — ASPIRIN 81 MG PO CHEW
81.0000 mg | CHEWABLE_TABLET | ORAL | Status: AC
  Administered 2023-03-31: 81 mg via ORAL

## 2023-03-31 MED ORDER — SODIUM CHLORIDE 0.9 % WEIGHT BASED INFUSION
3.0000 mL/kg/h | INTRAVENOUS | Status: AC
  Administered 2023-03-31: 3 mL/kg/h via INTRAVENOUS

## 2023-03-31 MED ORDER — SODIUM CHLORIDE 0.9 % IV SOLN: 250.0000 mL | INTRAVENOUS | Status: DC | PRN

## 2023-03-31 MED ORDER — SODIUM CHLORIDE 0.9 % WEIGHT BASED INFUSION: 1.0000 mL/kg/h | INTRAVENOUS | Status: DC

## 2023-03-31 MED ORDER — HEPARIN (PORCINE) IN NACL 2000-0.9 UNIT/L-% IV SOLN
INTRAVENOUS | Status: DC | PRN
  Administered 2023-03-31: 1000 mL

## 2023-03-31 MED ORDER — HEPARIN SODIUM (PORCINE) 1000 UNIT/ML IJ SOLN
INTRAMUSCULAR | Status: DC | PRN
  Administered 2023-03-31: 3500 [IU] via INTRAVENOUS

## 2023-03-31 MED ORDER — ONDANSETRON HCL 4 MG/2ML IJ SOLN: 4.0000 mg | Freq: Four times a day (QID) | INTRAMUSCULAR | Status: DC | PRN

## 2023-03-31 MED ORDER — SODIUM CHLORIDE 0.9% FLUSH: 3.0000 mL | Freq: Two times a day (BID) | INTRAVENOUS | Status: DC

## 2023-03-31 MED ORDER — MIDAZOLAM HCL 2 MG/2ML IJ SOLN
INTRAMUSCULAR | Status: AC
  Filled 2023-03-31: qty 2

## 2023-03-31 MED ORDER — FENTANYL CITRATE (PF) 100 MCG/2ML IJ SOLN
INTRAMUSCULAR | Status: AC
  Filled 2023-03-31: qty 2

## 2023-03-31 MED ORDER — VERAPAMIL HCL 2.5 MG/ML IV SOLN
INTRAVENOUS | Status: DC | PRN
  Administered 2023-03-31 (×2): 2.5 mg via INTRAVENOUS

## 2023-03-31 MED ORDER — FENTANYL CITRATE (PF) 100 MCG/2ML IJ SOLN
INTRAMUSCULAR | Status: DC | PRN
  Administered 2023-03-31: 12.5 ug via INTRAVENOUS

## 2023-03-31 MED ORDER — MIDAZOLAM HCL 2 MG/2ML IJ SOLN
INTRAMUSCULAR | Status: DC | PRN
  Administered 2023-03-31: .5 mg via INTRAVENOUS

## 2023-03-31 MED ORDER — HEPARIN (PORCINE) IN NACL 1000-0.9 UT/500ML-% IV SOLN
INTRAVENOUS | Status: AC
  Filled 2023-03-31: qty 1000

## 2023-03-31 MED ORDER — LABETALOL HCL 5 MG/ML IV SOLN: 10.0000 mg | INTRAVENOUS | Status: DC | PRN

## 2023-03-31 MED ORDER — ASPIRIN 81 MG PO CHEW
CHEWABLE_TABLET | ORAL | Status: AC
  Filled 2023-03-31: qty 1

## 2023-03-31 SURGICAL SUPPLY — 12 items
CATH 5FR JL3.5 JR4 ANG PIG MP (CATHETERS) IMPLANT
CATH BALLN WEDGE 5F 110CM (CATHETERS) IMPLANT
DEVICE RAD TR BAND REGULAR (VASCULAR PRODUCTS) IMPLANT
DRAPE BRACHIAL (DRAPES) IMPLANT
GLIDESHEATH SLEND SS 6F .021 (SHEATH) IMPLANT
GUIDEWIRE INQWIRE 1.5J.035X260 (WIRE) IMPLANT
INQWIRE 1.5J .035X260CM (WIRE) ×1
PACK CARDIAC CATH (CUSTOM PROCEDURE TRAY) ×1 IMPLANT
PROTECTION STATION PRESSURIZED (MISCELLANEOUS) ×1
SET ATX-X65L (MISCELLANEOUS) IMPLANT
SHEATH GLIDE SLENDER 4/5FR (SHEATH) IMPLANT
STATION PROTECTION PRESSURIZED (MISCELLANEOUS) IMPLANT

## 2023-03-31 NOTE — Brief Op Note (Signed)
BRIEF CARDIAC CATHETERIZATION NOTE  03/31/2023  9:01 AM  PATIENT:  Rhonda [name]  67 y.o. female  PRE-OPERATIVE DIAGNOSIS:  Mitral regurgitation  POST-OPERATIVE DIAGNOSIS:  Same  PROCEDURE:  Procedure(s): RIGHT/LEFT HEART CATH AND CORONARY ANGIOGRAPHY (Bilateral)  SURGEON:  Surgeons and Role:    Yvonne Kendall, MD - Primary  FINDINGS: Nonobstructive CAD with patent RCA stent. Upper normal to mildly elevated left and right heart filling pressures. Normal Fick cardiac output/index.  RECOMMENDATIONS: Continue workup/management of valvular heart disease per Dr. Jacques Navy and valve team. If no evidence of bleeding/vascular injury, resume apixaban tonight.  Yvonne Kendall, MD [facility]

## 2023-03-31 NOTE — Interval H&P Note (Signed)
History and Physical Interval Note:  03/31/2023 7:44 AM  Rhonda [name] Rhonda [name]  has presented today for surgery, with the diagnosis of severe mitral valve regurgitation.  The various methods of treatment have been discussed with the patient and family. After consideration of risks, benefits and other options for treatment, the patient has consented to  Procedure(s): RIGHT/LEFT HEART CATH AND CORONARY ANGIOGRAPHY (Bilateral) as a surgical intervention.  The patient's history has been reviewed, patient examined, no change in status, stable for surgery.  I have reviewed the patient's chart and labs.  Questions were answered to the patient's satisfaction.    Cath Lab Visit (complete for each Cath Lab visit)  Clinical Evaluation Leading to the Procedure:   ACS: No.  Non-ACS:    Anginal Classification: No Symptoms  Anti-ischemic medical therapy: Maximal Therapy (2 or more classes of medications)  Non-Invasive Test Results: No non-invasive testing performed  Prior CABG: No previous CABG  Rhonda [name]

## 2023-04-03 ENCOUNTER — Encounter: Payer: Self-pay | Admitting: Internal Medicine

## 2023-04-10 ENCOUNTER — Other Ambulatory Visit: Payer: Self-pay

## 2023-04-10 DIAGNOSIS — I4821 Permanent atrial fibrillation: Secondary | ICD-10-CM

## 2023-04-10 MED ORDER — APIXABAN 5 MG PO TABS: 5.0000 mg | ORAL_TABLET | Freq: Two times a day (BID) | ORAL | 5 refills | Status: DC

## 2023-04-10 NOTE — Telephone Encounter (Signed)
Prescription refill request for Eliquis received. Indication: Afib  Last office visit: 03/06/23 Jacques Navy)  Scr: 0.89 (03/01/23) Age: 67 Weight: 72.6kg  Appropriate dose. Refill sent.

## 2023-04-19 NOTE — Progress Notes (Unsigned)
[address]       [place] [address]             [phone]           [name] Medical Record #[id number] Date of Birth: March 22, 1956  Rhonda Dove, FNP  Chief Complaint:  fatigue and PND   History of Present Illness:     Pt is a very pleasant 67 yo female who has been found to have severe MR and TR. Pt with afib over the past 10 yrs not converted with two cardioversions now treated with rate control and anticoagulation. Pt has been having PND and now sleeps on several pillows. She also has had increasing fatigue and lower ext edema. TTE earlier this year with slightly depressed LV function and torrential MR and severe TR. Pt with severely dilated LA. 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 She underwent cath with no significant PHTN and moderate OM CAD. She was felt to need surgical intervention. Of note, she has alcoholic cirrhosis MELD-NA of 11 and followed by GI. She has mild verices but no history of GI bleed. She reports no alcohol except for rare drink now and then.       Past Medical History:  Diagnosis Date   Allergy    Atrial fibrillation (HCC)    CAD (coronary artery disease) 08/2009   BMS to RCA, non-obstructive in CX and LAD 2011   Cataract    CHF (congestive heart failure) (HCC)    Edema, peripheral    Hypertension    Myocardial infarction [facility])    Nausea    Pneumonia    January 2020   Sleep apnea    Trichomoniasis 06/05/2018    Past Surgical History:  Procedure Laterality Date   BIOPSY  08/06/2020   Procedure: BIOPSY;  Surgeon: Rachael Fee, MD;  Location: [facility];  Service: Gastroenterology;;   CESAREAN SECTION     x 1. no BTL   COLONOSCOPY WITH PROPOFOL N/A 08/06/2020   Procedure: COLONOSCOPY WITH PROPOFOL;  Surgeon: Rachael Fee, MD;  Location: [facility];  Service: Gastroenterology;  Laterality: N/A;    CORONARY ANGIOPLASTY WITH STENT PLACEMENT     DILATION AND CURETTAGE, DIAGNOSTIC / THERAPEUTIC     for SAB   ESOPHAGOGASTRODUODENOSCOPY (EGD) WITH PROPOFOL N/A 08/06/2020   Procedure: ESOPHAGOGASTRODUODENOSCOPY (EGD) WITH PROPOFOL;  Surgeon: Rachael Fee, MD;  Location: [facility];  Service: Gastroenterology;  Laterality: N/A;   HYSTEROSCOPY WITH D & C N/A 03/13/2019   Procedure: DILATATION AND CURETTAGE /HYSTEROSCOPY;  Surgeon: [place] Bing, MD;  Location: [facility];  Service: Gynecology;  Laterality: N/A;   POLYPECTOMY  08/06/2020   Procedure: POLYPECTOMY;  Surgeon: Rachael Fee, MD;  Location: [facility];  Service: Gastroenterology;;   RIGHT/LEFT HEART CATH AND CORONARY ANGIOGRAPHY Bilateral 03/31/2023   Procedure: RIGHT/LEFT HEART CATH AND CORONARY ANGIOGRAPHY;  Surgeon: Yvonne Kendall, MD;  Location: [facility] INVASIVE CV LAB;  Service: Cardiovascular;  Laterality: Bilateral;   TEE WITHOUT CARDIOVERSION N/A 03/08/2023   Procedure: TRANSESOPHAGEAL ECHOCARDIOGRAM;  Surgeon: Parke Poisson, MD;  Location: [facility] INVASIVE CV LAB;  Service: Cardiovascular;  Laterality: N/A;    Social History   Tobacco Use  Smoking Status Former   Current packs/day: 0.20   Types: Cigarettes  Smokeless Tobacco Never    Social History   Substance and Sexual  Activity  Alcohol Use Not Currently    Social History   Socioeconomic History   Marital status: Widowed    Spouse name: Not on file   Number of children: 3   Years of education: Not on file   Highest education level: Not on file  Occupational History   Not on file  Tobacco Use   Smoking status: Former    Current packs/day: 0.20    Types: Cigarettes   Smokeless tobacco: Never  Vaping Use   Vaping status: Never Used  Substance and Sexual Activity   Alcohol use: Not Currently   Drug use: Yes    Types: Marijuana    Comment: nightly   Sexual activity: Yes    Birth control/protection: Post-menopausal  Other Topics  Concern   Not on file  Social History Narrative   Not on file   Social Determinants of Health   Financial Resource Strain: Not on File (09/23/2021)   Received from [employer], [employer]    Financial Resource Strain: 0  Food Insecurity: Not on File (03/02/2023)   Received from [employer] Insecurity    Food: 0  Transportation Needs: Not on File (09/23/2021)   Received from [place], [employer] Needs    Transportation: 0  Physical Activity: Not on File (09/23/2021)   Received from [place], [place]   Physical Activity    Physical Activity: 0  Stress: Not on File (09/23/2021)   Received from [facility], [place]   Stress    Stress: 0  Social Connections: Not on File (02/26/2023)   Received from [employer]   Social Connections    Connectedness: 0  Intimate Partner Violence: Not on file    Allergies  Allergen Reactions   Ace Inhibitors Cough   Prednisone Itching and Swelling    Itching on leg and arms swelling from knee to ankles and felt restless    Current Outpatient Medications  Medication Sig Dispense Refill   albuterol (PROVENTIL HFA;VENTOLIN HFA) 108 (90 Base) MCG/ACT inhaler Inhale 2 puffs into the lungs every 6 (six) hours as needed for wheezing or shortness of breath. 1 Inhaler 2   apixaban (ELIQUIS) 5 MG TABS tablet Take 1 tablet (5 mg total) by mouth 2 (two) times daily. 60 tablet 5   atorvastatin (LIPITOR) 20 MG tablet Take 1 tablet (20 mg total) by mouth every evening. 90 tablet 3   digoxin (LANOXIN) 0.125 MG tablet Take 0.5 tablets (0.0625 mg total) by mouth daily. 45 tablet 3   diltiazem (CARDIZEM CD) 360 MG 24 hr capsule TAKE 1 CAPSULE BY MOUTH EVERY DAY 90 capsule 3   furosemide (LASIX) 40 MG tablet Take 1 tablet (40 mg total) by mouth daily. 90 tablet 3   Homeopathic Products [facility] MENTAL FOCUS PO) Take 2 capsules by mouth daily. [employer] MentaFocus     metoprolol tartrate (LOPRESSOR) 100 MG tablet Take 1 tablet (100 mg total) by mouth 2 (two)  times daily. 180 tablet 3   NON FORMULARY Take 2 capsules by mouth daily. Mood+ by [employer] GLOBAL     pantoprazole (PROTONIX) 40 MG tablet Take 1 tablet (40 mg total) by mouth daily. 90 tablet 0   spironolactone (ALDACTONE) 100 MG tablet Take 1 tablet (100 mg total) by mouth daily. 30 tablet 3   No current facility-administered medications for this visit.     Family History  Problem Relation Age of Onset   CAD Mother    Diabetes Mellitus II Father  Colon cancer Father    Hypertension Sister    Breast cancer Paternal Aunt 30   CAD Maternal Grandmother    Kidney disease Son        Physical Exam: Teeth in good repair Lungs: clear Card: IRR with 3/6 sem Ext: 1+ edema bilaterally Neuro: no focal deficits     Diagnostic Studies & Laboratory data: I have personally reviewed the following studies and agree with the findings   TTE (12/2022) IMPRESSIONS     1. Left ventricular ejection fraction, by estimation, is 50 to 55%. The  left ventricle has low normal function. The left ventricle has no regional  wall motion abnormalities. Left ventricular diastolic function could not  be evaluated. There is the  interventricular septum is flattened in systole and diastole, consistent  with right ventricular pressure and volume overload.   2. Right ventricular systolic function is normal. The right ventricular  size is normal. There is moderately elevated pulmonary artery systolic  pressure. The estimated right ventricular systolic pressure is 52.5 mmHg.   3. Right atrial size was moderately dilated.   4. The mitral valve appears normal in structure. There is torrential 4+  eccentric posteriorly directed mitral regurgitation very likely due to  atrial functional MR. The left atrium is severely dilated with an ESV  index >154ml/m2.   5. Tricuspid valve regurgitation is severe.   6. The aortic valve is normal in structure. Aortic valve regurgitation is  not visualized. No aortic  stenosis is present.   7. The inferior vena cava is dilated in size with <50% respiratory  variability, suggesting right atrial pressure of 15 mmHg.   FINDINGS   Left Ventricle: Left ventricular ejection fraction, by estimation, is 50  to 55%. The left ventricle has low normal function. The left ventricle has  no regional wall motion abnormalities. The left ventricular internal  cavity size was normal in size.  There is no left ventricular hypertrophy. The interventricular septum is  flattened in systole and diastole, consistent with right ventricular  pressure and volume overload. Left ventricular diastolic function could  not be evaluated due to atrial  fibrillation. Left ventricular diastolic function could not be evaluated.   Right Ventricle: The right ventricular size is normal. No increase in  right ventricular wall thickness. Right ventricular systolic function is  normal. There is moderately elevated pulmonary artery systolic pressure.  The tricuspid regurgitant velocity is  3.06 m/s, and with an assumed right atrial pressure of 15 mmHg, the  estimated right ventricular systolic pressure is 52.5 mmHg.   Left Atrium: Left atrial size was normal in size.   Right Atrium: Right atrial size was moderately dilated.   Pericardium: There is no evidence of pericardial effusion.   Mitral Valve: The mitral valve is normal in structure. Severe mitral valve  regurgitation. No evidence of mitral valve stenosis.   Tricuspid Valve: The tricuspid valve is normal in structure. Tricuspid  valve regurgitation is severe. No evidence of tricuspid stenosis.   Aortic Valve: The aortic valve is normal in structure. Aortic valve  regurgitation is not visualized. No aortic stenosis is present.   Pulmonic Valve: The pulmonic valve was normal in structure. Pulmonic valve  regurgitation is not visualized. No evidence of pulmonic stenosis.   Aorta: The aortic root is normal in size and structure.    Venous: The inferior vena cava is dilated in size with less than 50%  respiratory variability, suggesting right atrial pressure of 15 mmHg.   IAS/Shunts: No  atrial level shunt detected by color flow Doppler.     LEFT VENTRICLE  PLAX 2D  LVIDd:         6.30 cm  LVIDs:         4.60 cm  LV PW:         1.00 cm  LV IVS:        1.10 cm  LVOT diam:     2.30 cm   3D Volume EF:  LV SV:         80        3D EF:        50 %  LV SV Index:   46        LV EDV:       199 ml  LVOT Area:     4.15 cm  LV ESV:       99 ml                           LV SV:        99 ml   RIGHT VENTRICLE  RV Basal diam:  4.50 cm  RV Mid diam:    3.30 cm  RV S prime:     11.80 cm/s  TAPSE (M-mode): 2.0 cm   LEFT ATRIUM              Index         RIGHT ATRIUM           Index  LA diam:        8.10 cm  4.63 cm/m    RA Area:     23.00 cm  LA Vol (A2C):   238.0 ml 135.98 ml/m  RA Volume:   68.00 ml  38.85 ml/m  LA Vol (A4C):   344.0 ml 196.54 ml/m  LA Biplane Vol: 292.0 ml 166.83 ml/m   AORTIC VALVE  LVOT Vmax:   103.50 cm/s  LVOT Vmean:  65.400 cm/s  LVOT VTI:    0.194 m    AORTA  Ao Root diam: 3.00 cm  Ao Asc diam:  3.70 cm   MR Peak grad:    101.6 mmHg   TRICUSPID VALVE  MR Mean grad:    62.0 mmHg    TR Peak grad:   37.5 mmHg  MR Vmax:         504.00 cm/s  TR Vmax:        306.00 cm/s  MR Vmean:        362.0 cm/s  MR PISA:         6.28 cm     SHUNTS  MR PISA Eff ROA: 40 mm       Systemic VTI:  0.19 m  MR PISA Radius:  1.00 cm      Systemic Diam: 2.30 cm   TEE (03/2023) IMPRESSIONS     1. Left ventricular ejection fraction, by estimation, is 50 to 55%. The  left ventricle has low normal function.   2. Right ventricular systolic function is normal. The right ventricular  size is normal.   3. Left atrial size was massively dilated. No left atrial/left atrial  appendage thrombus was detected.   4. Right atrial size was mildly dilated.   5. A small pericardial effusion is present.   6. The  mitral valve is grossly normal. Severe mitral valve regurgitation  with coaptation gap, mechanism of MR is atrial functional. The mean mitral  valve gradient is 3.7 mmHg with average heart rate of 90 bpm. No mitral  stenosis.   7. Tricuspid valve regurgitation is moderate to severe.   8. The aortic valve is tricuspid. Aortic valve regurgitation is trivial.  No aortic stenosis is present.   9. There is mild (Grade II) plaque involving the aortic arch and  descending aorta.  10. Agitated saline contrast bubble study was negative, with no evidence  of any interatrial shunt.   FINDINGS   Left Ventricle: Left ventricular ejection fraction, by estimation, is 50  to 55%. The left ventricle has low normal function. The left ventricular  internal cavity size was normal in size.   Right Ventricle: The right ventricular size is normal. No increase in  right ventricular wall thickness. Right ventricular systolic function is  normal.   Left Atrium: Left atrial size was massively dilated. No left atrial/left  atrial appendage thrombus was detected.   Right Atrium: Right atrial size was mildly dilated.   Pericardium: A small pericardial effusion is present.   Mitral Valve: The mitral valve is grossly normal. Severe mitral valve  regurgitation. No evidence of mitral valve stenosis. MV peak gradient, 5.0  mmHg. The mean mitral valve gradient is 3.7 mmHg with average heart rate  of 90 bpm.   Tricuspid Valve: The tricuspid valve is grossly normal. Tricuspid valve  regurgitation is moderate to severe.   Aortic Valve: The aortic valve is tricuspid. Aortic valve regurgitation is  trivial. No aortic stenosis is present.   Pulmonic Valve: The pulmonic valve was grossly normal. Pulmonic valve  regurgitation is trivial.   Aorta: The aortic root and ascending aorta are structurally normal, with  no evidence of dilitation. There is mild (Grade II) plaque involving the  aortic arch and descending  aorta.   Venous: A pattern of systolic flow reversal, suggestive of severe mitral  regurgitation is recorded from the right upper pulmonary vein.   IAS/Shunts: No atrial level shunt detected by color flow Doppler. Agitated  saline contrast was given intravenously to evaluate for intracardiac  shunting. Agitated saline contrast bubble study was negative, with no  evidence of any interatrial shunt.   Additional Comments: Spectral Doppler performed.   AORTIC VALVE  LVOT Vmax:   91.30 cm/s  LVOT Vmean:  60.700 cm/s  LVOT VTI:    0.162 m    AORTA  Ao Root diam: 3.00 cm  Ao Asc diam:  3.44 cm   MITRAL VALVE                  TRICUSPID VALVE  MV Peak grad: 5.0 mmHg        TR Peak grad:   44.4 mmHg  MV Mean grad: 3.7 mmHg        TR Vmax:        333.00 cm/s  MV Vmax:      1.12 m/s  MV Vmean:     68.0 cm/s       SHUNTS  MR Peak grad:    102.4 mmHg   Systemic VTI: 0.16 m  MR Mean grad:    61.0 mmHg  MR Vmax:         506.00 cm/s  MR Vmean:        361.0 cm/s  MR PISA:         16.08 cm  MR PISA Eff ROA: 106 mm  MR PISA Radius:  1.60 cm    Recent Radiology Findings:   CATH (03/2023) Conclusion  Conclusions: Mild to moderate coronary artery disease, as detailed below, including sequential 20% proximal and distal LAD lesions, 50% small D1 disease, multifocal LCx stenoses of up to 50%, and 60-70% ostial OM2 lesion. Widely patent proximal RCA stent. Upper normal to mildly elevated left and right heart filling pressures. Normal Fick cardiac output/index. Diagnostic Dominance: Right  emo Data  Flowsheet Row Most Recent Value  Fick Cardiac Output 5.13 L/min  Fick Cardiac Output Index 2.99 (L/min)/BSA  RA A Wave 8 mmHg  RA V Wave 8 mmHg  RA Mean 7 mmHg  RV Systolic Pressure 37 mmHg  RV Diastolic Pressure -1 mmHg  RV EDP 7 mmHg  PA Systolic Pressure 36 mmHg  PA Diastolic Pressure 15 mmHg  PA Mean 23 mmHg  PW A Wave 19 mmHg  PW V Wave 19 mmHg  PW Mean 18 mmHg  AO Systolic  Pressure 88 mmHg  AO Diastolic Pressure 56 mmHg  AO Mean 69 mmHg  LV Systolic Pressure 86 mmHg  LV Diastolic Pressure 3 mmHg  LV EDP 12 mmHg  AOp Systolic Pressure 89 mmHg  AOp Diastolic Pressure 54 mmHg  AOp Mean Pressure 69 mmHg  LVp Systolic Pressure 89 mmHg  LVp Diastolic Pressure 20 mmHg  LVp EDP Pressure 11 mmHg  QP/QS 1  TPVR Index 7.7 HRUI  TSVR Index 23.11 HRUI  PVR SVR Ratio 0.08  TPVR/TSVR Ratio 0.33        Recent Lab Findings: Lab Results  Component Value Date   WBC 7.4 03/01/2023   HGB 12.6 03/31/2023   HCT 37.0 03/31/2023   PLT 181.0 03/01/2023   GLUCOSE 100 (H) 03/01/2023   CHOL 142 11/28/2022   TRIG 70 11/28/2022   HDL 47 11/28/2022   LDLCALC 81 11/28/2022   ALT 11 03/01/2023   AST 14 03/01/2023   NA 139 03/31/2023   K 4.0 03/31/2023   CL 101 03/01/2023   CREATININE 0.89 03/01/2023   BUN 16 03/01/2023   CO2 32 03/01/2023   INR 1.5 (H) 03/01/2023   HGBA1C 5.7 (H) 03/29/2018      Assessment / Plan:     67 yo female with NYHA class 2 symptoms of severe MR and TR and slightly reduced LV function with moderate OM stenosis and chronic afib. I believe pt has a class I indication for surgical intervention on her MV and TV. I feel a bit concerned not seeing posterior leaflet well that a rheumatic component may be involved and not entirely Type I etiology however just places her at higher need for replacement (tissue valve). She will not benefit from maze procedure due to longevity of afib and severely dilated LA however will occlude her LA appendage at time of surgery. She does have moderate OM disease but with her cirrhosis to add CABG may be risking longer pump run and will treat for now medically. All the risks and goals and recovery from surgery were discussed and she wishes to proceed on 06/14/2023. She will need to be off eliquis for 5 days prior to surgery.    I have spent 60 min in review of the records, viewing studies and in face to face with patient  and in coordination of future care    Eugenio Hoes 04/19/2023 12:22 PM

## 2023-04-20 ENCOUNTER — Encounter: Payer: Self-pay | Admitting: Thoracic Surgery (Cardiothoracic Vascular Surgery)

## 2023-04-20 ENCOUNTER — Other Ambulatory Visit: Payer: Self-pay | Admitting: *Deleted

## 2023-04-20 ENCOUNTER — Encounter: Payer: Self-pay | Admitting: *Deleted

## 2023-04-20 ENCOUNTER — Other Ambulatory Visit: Payer: Self-pay | Admitting: Thoracic Surgery (Cardiothoracic Vascular Surgery)

## 2023-04-20 ENCOUNTER — Institutional Professional Consult (permissible substitution): Payer: Medicare HMO | Admitting: Thoracic Surgery (Cardiothoracic Vascular Surgery)

## 2023-04-20 VITALS — BP 94/65 | HR 66 | Resp 20 | Ht 61.0 in | Wt 160.0 lb

## 2023-04-20 DIAGNOSIS — I340 Nonrheumatic mitral (valve) insufficiency: Secondary | ICD-10-CM

## 2023-04-20 DIAGNOSIS — I361 Nonrheumatic tricuspid (valve) insufficiency: Secondary | ICD-10-CM

## 2023-04-20 DIAGNOSIS — I4891 Unspecified atrial fibrillation: Secondary | ICD-10-CM

## 2023-04-20 NOTE — Patient Instructions (Signed)
MV and TV surgery 1/8 Preop testing Off eliquis for 5 days prior to surgery

## 2023-05-15 ENCOUNTER — Ambulatory Visit: Payer: Medicare HMO | Admitting: Internal Medicine

## 2023-05-16 DIAGNOSIS — H52223 Regular astigmatism, bilateral: Secondary | ICD-10-CM | POA: Diagnosis not present

## 2023-05-16 DIAGNOSIS — H353121 Nonexudative age-related macular degeneration, left eye, early dry stage: Secondary | ICD-10-CM | POA: Diagnosis not present

## 2023-05-16 DIAGNOSIS — H0288A Meibomian gland dysfunction right eye, upper and lower eyelids: Secondary | ICD-10-CM | POA: Diagnosis not present

## 2023-05-16 DIAGNOSIS — H0288B Meibomian gland dysfunction left eye, upper and lower eyelids: Secondary | ICD-10-CM | POA: Diagnosis not present

## 2023-05-16 DIAGNOSIS — H5203 Hypermetropia, bilateral: Secondary | ICD-10-CM | POA: Diagnosis not present

## 2023-05-16 DIAGNOSIS — Z0100 Encounter for examination of eyes and vision without abnormal findings: Secondary | ICD-10-CM | POA: Diagnosis not present

## 2023-05-16 DIAGNOSIS — Z9842 Cataract extraction status, left eye: Secondary | ICD-10-CM | POA: Diagnosis not present

## 2023-05-16 DIAGNOSIS — Z9841 Cataract extraction status, right eye: Secondary | ICD-10-CM | POA: Diagnosis not present

## 2023-05-22 ENCOUNTER — Encounter: Payer: Self-pay | Admitting: Internal Medicine

## 2023-05-26 ENCOUNTER — Encounter: Payer: Self-pay | Admitting: *Deleted

## 2023-06-06 ENCOUNTER — Other Ambulatory Visit: Payer: Self-pay | Admitting: Internal Medicine

## 2023-06-09 NOTE — Pre-Procedure Instructions (Signed)
 Surgical Instructions   Your procedure is scheduled on June 14, 2023. Report to [facility] Main Entrance A at 6:30 A.M., then check in with the Admitting office. Any questions or running late day of surgery: call [phone]  Questions prior to your surgery date: call [phone], Monday-Friday, 8am-4pm. If you experience any cold or flu symptoms such as cough, fever, chills, shortness of breath, etc. between now and your scheduled surgery, please notify us  at the above number.     Remember:  Do not eat or drink after midnight the night before your surgery    Take these medicines the morning of surgery with A SIP OF WATER: digoxin  (LANOXIN )  diltiazem  (CARDIZEM  CD)  metoprolol  tartrate (LOPRESSOR )    May take these medicines IF NEEDED: acetaminophen  (TYLENOL )  albuterol  (PROVENTIL  HFA;VENTOLIN  HFA) inhaler - please bring inhaler with you morning of surgery   STOP taking your apixaban  (ELIQUIS ) five days prior to surgery. Your last dose will be January 2nd.   One week prior to surgery, STOP taking any Aspirin  (unless otherwise instructed by your surgeon) Aleve, Naproxen, Ibuprofen, Motrin, Advil, Goody's, BC's, all herbal medications, fish oil, and non-prescription vitamins.                     Do NOT Smoke (Tobacco/Vaping) for 24 hours prior to your procedure.  If you use a CPAP at night, you may bring your mask/headgear for your overnight stay.   You will be asked to remove any contacts, glasses, piercing's, hearing aid's, dentures/partials prior to surgery. Please bring cases for these items if needed.    Patients discharged the day of surgery will not be allowed to drive home, and someone needs to stay with them for 24 hours.  SURGICAL WAITING ROOM VISITATION Patients may have no more than 2 support people in the waiting area - these visitors may rotate.   Pre-op nurse will coordinate an appropriate time for 1 ADULT support person, who may not rotate, to accompany  patient in pre-op.  Children under the age of 36 must have an adult with them who is not the patient and must remain in the main waiting area with an adult.  If the patient needs to stay at the hospital during part of their recovery, the visitor guidelines for inpatient rooms apply.  Please refer to the [facility] website for the visitor guidelines for any additional information.   If you received a COVID test during your pre-op visit  it is requested that you wear a mask when out in public, stay away from anyone that may not be feeling well and notify your surgeon if you develop symptoms. If you have been in contact with anyone that has tested positive in the last 10 days please notify you surgeon.      Pre-operative CHG Bathing Instructions   You can play a key role in reducing the risk of infection after surgery. Your skin needs to be as free of germs as possible. You can reduce the number of germs on your skin by washing with CHG (chlorhexidine  gluconate) soap before surgery. CHG is an antiseptic soap that kills germs and continues to kill germs even after washing.   DO NOT use if you have an allergy to chlorhexidine /CHG or antibacterial soaps. If your skin becomes reddened or irritated, stop using the CHG and notify one of our RNs at [phone].              TAKE A SHOWER THE  NIGHT BEFORE SURGERY AND THE DAY OF SURGERY    Please keep in mind the following:  DO NOT shave, including legs and underarms, 48 hours prior to surgery.   You may shave your face before/day of surgery.  Place clean sheets on your bed the night before surgery Use a clean washcloth (not used since being washed) for each shower. DO NOT sleep with pet's night before surgery.  CHG Shower Instructions:  Wash your face and private area with normal soap. If you choose to wash your hair, wash first with your normal shampoo.  After you use shampoo/soap, rinse your hair and body thoroughly to remove shampoo/soap  residue.  Turn the water OFF and apply half the bottle of CHG soap to a CLEAN washcloth.  Apply CHG soap ONLY FROM YOUR NECK DOWN TO YOUR TOES (washing for 3-5 minutes)  DO NOT use CHG soap on face, private areas, open wounds, or sores.  Pay special attention to the area where your surgery is being performed.  If you are having back surgery, having someone wash your back for you may be helpful. Wait 2 minutes after CHG soap is applied, then you may rinse off the CHG soap.  Pat dry with a clean towel  Put on clean pajamas    Additional instructions for the day of surgery: DO NOT APPLY any lotions, deodorants, cologne, or perfumes.   Do not wear jewelry or makeup Do not wear nail polish, gel polish, artificial nails, or any other type of covering on natural nails (fingers and toes) Do not bring valuables to the hospital. [facility] is not responsible for valuables/personal belongings. Put on clean/comfortable clothes.  Please brush your teeth.  Ask your nurse before applying any prescription medications to the skin.

## 2023-06-12 ENCOUNTER — Other Ambulatory Visit: Payer: Self-pay

## 2023-06-12 ENCOUNTER — Ambulatory Visit (HOSPITAL_BASED_OUTPATIENT_CLINIC_OR_DEPARTMENT_OTHER)
Admission: RE | Admit: 2023-06-12 | Discharge: 2023-06-12 | Disposition: A | Discharge status: Home / Self Care | Payer: Medicare Other | Source: Ambulatory Visit | Attending: Thoracic Surgery (Cardiothoracic Vascular Surgery) | Admitting: Thoracic Surgery (Cardiothoracic Vascular Surgery)

## 2023-06-12 ENCOUNTER — Ambulatory Visit (HOSPITAL_COMMUNITY)
Admission: RE | Admit: 2023-06-12 | Discharge: 2023-06-12 | Disposition: A | Discharge status: Home / Self Care | Payer: Medicare Other | Source: Ambulatory Visit | Attending: Thoracic Surgery (Cardiothoracic Vascular Surgery) | Admitting: Thoracic Surgery (Cardiothoracic Vascular Surgery)

## 2023-06-12 ENCOUNTER — Encounter (HOSPITAL_COMMUNITY): Payer: Self-pay

## 2023-06-12 ENCOUNTER — Encounter (HOSPITAL_COMMUNITY)
Admission: RE | Admit: 2023-06-12 | Discharge: 2023-06-12 | Disposition: A | Discharge status: Home / Self Care | Payer: Medicare Other | Source: Ambulatory Visit | Attending: Thoracic Surgery (Cardiothoracic Vascular Surgery) | Admitting: Thoracic Surgery (Cardiothoracic Vascular Surgery)

## 2023-06-12 VITALS — BP 102/67 | HR 71 | Temp 97.9°F | Resp 18 | Ht 62.0 in | Wt 177.9 lb

## 2023-06-12 DIAGNOSIS — Z01818 Encounter for other preprocedural examination: Secondary | ICD-10-CM

## 2023-06-12 DIAGNOSIS — I340 Nonrheumatic mitral (valve) insufficiency: Secondary | ICD-10-CM | POA: Insufficient documentation

## 2023-06-12 DIAGNOSIS — I4891 Unspecified atrial fibrillation: Secondary | ICD-10-CM

## 2023-06-12 DIAGNOSIS — I361 Nonrheumatic tricuspid (valve) insufficiency: Secondary | ICD-10-CM | POA: Insufficient documentation

## 2023-06-12 HISTORY — DX: Cardiac arrhythmia, unspecified: I49.9

## 2023-06-12 HISTORY — DX: Cardiac murmur, unspecified: R01.1

## 2023-06-12 HISTORY — DX: Unspecified cirrhosis of liver: K74.60

## 2023-06-12 LAB — COMPREHENSIVE METABOLIC PANEL
ALT: 13 U/L (ref 0–44)
AST: 19 U/L (ref 15–41)
Albumin: 3.5 g/dL (ref 3.5–5.0)
Alkaline Phosphatase: 67 U/L (ref 38–126)
Anion gap: 8 (ref 5–15)
BUN: 15 mg/dL (ref 8–23)
CO2: 30 mmol/L (ref 22–32)
Calcium: 9.4 mg/dL (ref 8.9–10.3)
Chloride: 100 mmol/L (ref 98–111)
Creatinine, Ser: 0.96 mg/dL (ref 0.44–1.00)
GFR, Estimated: >60 mL/min (ref >60)
Glucose, Bld: 123 mg/dL — ABNORMAL HIGH (ref 70–99)
Potassium: 4.5 mmol/L (ref 3.5–5.1)
Sodium: 138 mmol/L (ref 135–145)
Total Bilirubin: 1.2 mg/dL (ref 0.0–1.2)
Total Protein: 6.5 g/dL (ref 6.5–8.1)

## 2023-06-12 LAB — CBC
HCT: 39.9 % (ref 36.0–46.0)
Hemoglobin: 12.7 g/dL (ref 12.0–15.0)
MCH: 30.5 pg (ref 26.0–34.0)
MCHC: 31.8 g/dL (ref 30.0–36.0)
MCV: 95.9 fL (ref 80.0–100.0)
Platelets: 191 10*3/uL (ref 150–400)
RBC: 4.16 MIL/uL (ref 3.87–5.11)
RDW: 14.6 % (ref 11.5–15.5)
WBC: 8.2 10*3/uL (ref 4.0–10.5)
nRBC: 0 % (ref 0.0–0.2)

## 2023-06-12 LAB — URINALYSIS, ROUTINE W REFLEX MICROSCOPIC
Bilirubin Urine: NEGATIVE
Glucose, UA: NEGATIVE mg/dL
Hgb urine dipstick: NEGATIVE
Ketones, ur: NEGATIVE mg/dL
Leukocytes,Ua: NEGATIVE
Nitrite: NEGATIVE
Protein, ur: NEGATIVE mg/dL
Specific Gravity, Urine: 1.004 — ABNORMAL LOW (ref 1.005–1.030)
pH: 6 (ref 5.0–8.0)

## 2023-06-12 LAB — TYPE AND SCREEN
ABO/RH(D): A POS
Antibody Screen: NEGATIVE

## 2023-06-12 LAB — APTT: aPTT: 28 s (ref 24–36)

## 2023-06-12 LAB — SURGICAL PCR SCREEN
MRSA, PCR: NEGATIVE
Staphylococcus aureus: NEGATIVE

## 2023-06-12 LAB — PROTIME-INR
INR: 1.2 (ref 0.8–1.2)
Prothrombin Time: 15.7 s — ABNORMAL HIGH (ref 11.4–15.2)

## 2023-06-12 NOTE — Progress Notes (Signed)
 PCP - Delorise Gladis Daring Cardiologist - Dr. Loni Rushing - Last office visit 03/06/2023  PPM/ICD - Denies Device Orders - n/a Rep Notified - n/a  Chest x-ray - 06/12/2023 EKG - 06/12/2023 Stress Test - Denies ECHO - 03/08/2023 Cardiac Cath - 03/31/2023  Sleep Study - +OSA but patient unable to tolerate CPAP  No DM  Last dose of GLP1 agonist- n/a GLP1 instructions: n/a  Blood Thinner Instructions: Pt instructed to stop Eliquis  five days prior to surgery. Last dose 06/08/2023 Aspirin  Instructions: n/a  NPO after midnight  COVID TEST- n/a   Anesthesia review: Yes. Hx of MI with stent, HTN, A. Fib on anticoagulation, OSA and Liver Cirrhosis   Patient denies shortness of breath, fever, cough and chest pain at PAT appointment. Pt endorses bronchitis 2.5 months ago. All symptoms have resolved except for a lingering productive cough with brown colored phlegm, which per pt is getting better everyday.   All instructions explained to the patient, with a verbal understanding of the material. Patient agrees to go over the instructions while at home for a better understanding. Patient also instructed to self quarantine after being tested for COVID-19. The opportunity to ask questions was provided.

## 2023-06-13 LAB — HEMOGLOBIN A1C
Hgb A1c MFr Bld: 6.2 % — ABNORMAL HIGH (ref 4.8–5.6)
Mean Plasma Glucose: 131 mg/dL

## 2023-06-13 MED ORDER — INSULIN REGULAR(HUMAN) IN NACL 100-0.9 UT/100ML-% IV SOLN
INTRAVENOUS | Status: AC
  Administered 2023-06-14: .7 [IU]/h via INTRAVENOUS
  Filled 2023-06-13: qty 100

## 2023-06-13 MED ORDER — CEFAZOLIN SODIUM-DEXTROSE 2-4 GM/100ML-% IV SOLN
2.0000 g | INTRAVENOUS | Status: AC
  Administered 2023-06-14: 2 g via INTRAVENOUS
  Filled 2023-06-13: qty 100

## 2023-06-13 MED ORDER — MILRINONE LACTATE IN DEXTROSE 20-5 MG/100ML-% IV SOLN
0.3000 ug/kg/min | INTRAVENOUS | Status: DC
  Filled 2023-06-13: qty 100

## 2023-06-13 MED ORDER — PLASMA-LYTE A IV SOLN
INTRAVENOUS | Status: DC
  Filled 2023-06-13: qty 2.5

## 2023-06-13 MED ORDER — NOREPINEPHRINE 4 MG/250ML-% IV SOLN
0.0000 ug/min | INTRAVENOUS | Status: AC
  Administered 2023-06-14: 2 ug/min via INTRAVENOUS
  Filled 2023-06-13: qty 250

## 2023-06-13 MED ORDER — POTASSIUM CHLORIDE 2 MEQ/ML IV SOLN
80.0000 meq | INTRAVENOUS | Status: DC
  Filled 2023-06-13: qty 40

## 2023-06-13 MED ORDER — TRANEXAMIC ACID (OHS) BOLUS VIA INFUSION
15.0000 mg/kg | INTRAVENOUS | Status: AC
  Administered 2023-06-14: 1210.5 mg via INTRAVENOUS
  Filled 2023-06-13: qty 1211

## 2023-06-13 MED ORDER — TRANEXAMIC ACID (OHS) PUMP PRIME SOLUTION
2.0000 mg/kg | INTRAVENOUS | Status: DC
  Filled 2023-06-13: qty 1.61

## 2023-06-13 MED ORDER — DEXMEDETOMIDINE HCL IN NACL 400 MCG/100ML IV SOLN
0.1000 ug/kg/h | INTRAVENOUS | Status: AC
  Administered 2023-06-14: .7 ug/kg/h via INTRAVENOUS
  Filled 2023-06-13: qty 100

## 2023-06-13 MED ORDER — VANCOMYCIN HCL 1000 MG IV SOLR
INTRAVENOUS | Status: DC
  Filled 2023-06-13: qty 20

## 2023-06-13 MED ORDER — MANNITOL 20 % IV SOLN
INTRAVENOUS | Status: DC
  Filled 2023-06-13: qty 13

## 2023-06-13 MED ORDER — VANCOMYCIN HCL 1250 MG/250ML IV SOLN
1250.0000 mg | INTRAVENOUS | Status: AC
  Administered 2023-06-14: 1250 mg via INTRAVENOUS
  Filled 2023-06-13: qty 250

## 2023-06-13 MED ORDER — EPINEPHRINE HCL 5 MG/250ML IV SOLN IN NS
0.0000 ug/min | INTRAVENOUS | Status: AC
  Administered 2023-06-14: 2 ug/min via INTRAVENOUS
  Filled 2023-06-13: qty 250

## 2023-06-13 MED ORDER — TRANEXAMIC ACID 1000 MG/10ML IV SOLN
1.5000 mg/kg/h | INTRAVENOUS | Status: AC
  Administered 2023-06-14: 1.5 mg/kg/h via INTRAVENOUS
  Filled 2023-06-13: qty 20

## 2023-06-13 MED ORDER — HEPARIN 30,000 UNITS/1000 ML (OHS) CELLSAVER SOLUTION
Status: DC
  Filled 2023-06-13: qty 1000

## 2023-06-13 MED ORDER — PHENYLEPHRINE HCL-NACL 20-0.9 MG/250ML-% IV SOLN
30.0000 ug/min | INTRAVENOUS | Status: AC
  Administered 2023-06-14: 40 ug/min via INTRAVENOUS
  Filled 2023-06-13: qty 250

## 2023-06-13 MED ORDER — NITROGLYCERIN IN D5W 200-5 MCG/ML-% IV SOLN
2.0000 ug/min | INTRAVENOUS | Status: DC
  Filled 2023-06-13: qty 250

## 2023-06-13 NOTE — H&P (Signed)
 [address]       [place] [address]             [phone]                                   Rhonda [name] [facility] Medical Record #[id number] Date of Birth: 1956/04/11   Delores Delorise Gladis DEWAINE Delores Delorise Gladis, FNP   Chief Complaint:  fatigue and PND    History of Present Illness:     Pt is a very pleasant 68 yo female who has been found to have severe MR and TR. Pt with afib over the past 10 yrs not converted with two cardioversions now treated with rate control and anticoagulation. Pt has been having PND and now sleeps on several pillows. She also has had increasing fatigue and lower ext edema. TTE earlier this year with slightly depressed LV function and torrential MR and severe TR. Pt with severely dilated LA. 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 She underwent cath with no significant PHTN and moderate OM CAD. She was felt to need surgical intervention. Of note, she has alcoholic cirrhosis MELD-NA of 11 and followed by GI. She has mild verices but no history of GI bleed. She reports no alcohol except for rare drink now and then.              Past Medical History:  Diagnosis Date   Allergy     Atrial fibrillation (HCC)     CAD (coronary artery disease) 08/2009    BMS to RCA, non-obstructive in CX and LAD 2011   Cataract     CHF (congestive heart failure) (HCC)     Edema, peripheral     Hypertension     Myocardial infarction [facility])     Nausea     Pneumonia      January 2020   Sleep apnea     Trichomoniasis 06/05/2018               Past Surgical History:  Procedure Laterality Date   BIOPSY   08/06/2020    Procedure: BIOPSY;  Surgeon: Teressa Toribio SQUIBB, MD;  Location: [facility];  Service: Gastroenterology;;   CESAREAN SECTION        x 1. no BTL   COLONOSCOPY WITH PROPOFOL  N/A 08/06/2020    Procedure: COLONOSCOPY WITH PROPOFOL ;  Surgeon: Teressa Toribio SQUIBB, MD;   Location: [facility];  Service: Gastroenterology;  Laterality: N/A;   CORONARY ANGIOPLASTY WITH STENT PLACEMENT       DILATION AND CURETTAGE, DIAGNOSTIC / THERAPEUTIC        for SAB   ESOPHAGOGASTRODUODENOSCOPY (EGD) WITH PROPOFOL  N/A 08/06/2020    Procedure: ESOPHAGOGASTRODUODENOSCOPY (EGD) WITH PROPOFOL ;  Surgeon: Teressa Toribio SQUIBB, MD;  Location: [facility];  Service: Gastroenterology;  Laterality: N/A;   HYSTEROSCOPY WITH D & C N/A 03/13/2019    Procedure: DILATATION AND CURETTAGE /HYSTEROSCOPY;  Surgeon: Izell Harari, MD;  Location: [facility];  Service: Gynecology;  Laterality: N/A;   POLYPECTOMY   08/06/2020    Procedure: POLYPECTOMY;  Surgeon: Teressa Toribio SQUIBB, MD;  Location: [facility];  Service: Gastroenterology;;   RIGHT/LEFT HEART CATH AND CORONARY ANGIOGRAPHY Bilateral 03/31/2023    Procedure: RIGHT/LEFT HEART CATH AND CORONARY ANGIOGRAPHY;  Surgeon: Mady Bruckner, MD;  Location: [facility]  INVASIVE CV LAB;  Service: Cardiovascular;  Laterality: Bilateral;   TEE WITHOUT CARDIOVERSION N/A 03/08/2023    Procedure: TRANSESOPHAGEAL ECHOCARDIOGRAM;  Surgeon: Loni Soyla LABOR, MD;  Location: [facility] INVASIVE CV LAB;  Service: Cardiovascular;  Laterality: N/A;          Tobacco Use History  Social History        Tobacco Use  Smoking Status Former   Current packs/day: 0.20   Types: Cigarettes  Smokeless Tobacco Never      Social History       Substance and Sexual Activity  Alcohol Use Not Currently      Social History         Socioeconomic History   Marital status: Widowed      Spouse name: Not on file   Number of children: 3   Years of education: Not on file   Highest education level: Not on file  Occupational History   Not on file  Tobacco Use   Smoking status: Former      Current packs/day: 0.20      Types: Cigarettes   Smokeless tobacco: Never  Vaping Use   Vaping status: Never Used  Substance and Sexual Activity   Alcohol use: Not Currently    Drug use: Yes      Types: Marijuana      Comment: nightly   Sexual activity: Yes      Birth control/protection: Post-menopausal  Other Topics Concern   Not on file  Social History Narrative   Not on file    Social Determinants of Health        Financial Resource Strain: Not on File (09/23/2021)    Received from [employer], [employer]     Financial Resource Strain: 0  Food Insecurity: Not on File (03/02/2023)    Received from [employer] In[facility]     Food: 0  Transportation Needs: Not on File (09/23/2021)    Received from [place], [employer] Needs     Transportation: 0  Physical Activity: Not on File (09/23/2021)    Received from [place], [place]    Physical Activity     Physical Activity: 0  Stress: Not on File (09/23/2021)    Received from [facility], [place]    Stress     Stress: 0  Social Connections: Not on File (02/26/2023)    Received from [employer]    Social Connections     Connectedness: 0  Intimate Partner Violence: Not on file      Allergies       Allergies  Allergen Reactions   Ace Inhibitors Cough   Prednisone  Itching and Swelling      Itching on leg and arms swelling from knee to ankles and felt restless              Current Outpatient Medications  Medication Sig Dispense Refill   albuterol  (PROVENTIL  HFA;VENTOLIN  HFA) 108 (90 Base) MCG/ACT inhaler Inhale 2 puffs into the lungs every 6 (six) hours as needed for wheezing or shortness of breath. 1 Inhaler 2   apixaban  (ELIQUIS ) 5 MG TABS tablet Take 1 tablet (5 mg total) by mouth 2 (two) times daily. 60 tablet 5   atorvastatin  (LIPITOR) 20 MG tablet Take 1 tablet (20 mg total) by mouth every evening. 90 tablet 3   digoxin  (LANOXIN ) 0.125 MG tablet Take 0.5 tablets (0.0625 mg total) by mouth daily. 45 tablet 3   diltiazem  (CARDIZEM  CD)  360 MG 24 hr capsule TAKE 1 CAPSULE BY MOUTH EVERY DAY 90 capsule 3   furosemide  (LASIX ) 40 MG tablet Take 1 tablet (40 mg total) by mouth daily. 90  tablet 3   Homeopathic Products [facility] MENTAL FOCUS PO) Take 2 capsules by mouth daily. Amare MentaFocus       metoprolol  tartrate (LOPRESSOR ) 100 MG tablet Take 1 tablet (100 mg total) by mouth 2 (two) times daily. 180 tablet 3   NON FORMULARY Take 2 capsules by mouth daily. Mood+ by [employer] CAIN       pantoprazole  (PROTONIX ) 40 MG tablet Take 1 tablet (40 mg total) by mouth daily. 90 tablet 0   spironolactone  (ALDACTONE ) 100 MG tablet Take 1 tablet (100 mg total) by mouth daily. 30 tablet 3      No current facility-administered medications for this visit.               Family History  Problem Relation Age of Onset   CAD Mother     Diabetes Mellitus II Father     Colon cancer Father     Hypertension Sister     Breast cancer Paternal Aunt 26   CAD Maternal Grandmother     Kidney disease Son                  Physical Exam: Teeth in good repair Lungs: clear Card: IRR with 3/6 sem Ext: 1+ edema bilaterally Neuro: no focal deficits         Diagnostic Studies & Laboratory data: I have personally reviewed the following studies and agree with the findings   TTE (12/2022) IMPRESSIONS     1. Left ventricular ejection fraction, by estimation, is 50 to 55%. The  left ventricle has low normal function. The left ventricle has no regional  wall motion abnormalities. Left ventricular diastolic function could not  be evaluated. There is the  interventricular septum is flattened in systole and diastole, consistent  with right ventricular pressure and volume overload.   2. Right ventricular systolic function is normal. The right ventricular  size is normal. There is moderately elevated pulmonary artery systolic  pressure. The estimated right ventricular systolic pressure is 52.5 mmHg.   3. Right atrial size was moderately dilated.   4. The mitral valve appears normal in structure. There is torrential 4+  eccentric posteriorly directed mitral regurgitation very likely due to   atrial functional MR. The left atrium is severely dilated with an ESV  index >176ml/m2.   5. Tricuspid valve regurgitation is severe.   6. The aortic valve is normal in structure. Aortic valve regurgitation is  not visualized. No aortic stenosis is present.   7. The inferior vena cava is dilated in size with <50% respiratory  variability, suggesting right atrial pressure of 15 mmHg.   FINDINGS   Left Ventricle: Left ventricular ejection fraction, by estimation, is 50  to 55%. The left ventricle has low normal function. The left ventricle has  no regional wall motion abnormalities. The left ventricular internal  cavity size was normal in size.  There is no left ventricular hypertrophy. The interventricular septum is  flattened in systole and diastole, consistent with right ventricular  pressure and volume overload. Left ventricular diastolic function could  not be evaluated due to atrial  fibrillation. Left ventricular diastolic function could not be evaluated.   Right Ventricle: The right ventricular size is normal. No increase in  right ventricular wall thickness. Right ventricular systolic function is  normal. There is moderately elevated pulmonary artery systolic pressure.  The tricuspid regurgitant velocity is  3.06 m/s, and with an assumed right atrial pressure of 15 mmHg, the  estimated right ventricular systolic pressure is 52.5 mmHg.   Left Atrium: Left atrial size was normal in size.   Right Atrium: Right atrial size was moderately dilated.   Pericardium: There is no evidence of pericardial effusion.   Mitral Valve: The mitral valve is normal in structure. Severe mitral valve  regurgitation. No evidence of mitral valve stenosis.   Tricuspid Valve: The tricuspid valve is normal in structure. Tricuspid  valve regurgitation is severe. No evidence of tricuspid stenosis.   Aortic Valve: The aortic valve is normal in structure. Aortic valve  regurgitation is not  visualized. No aortic stenosis is present.   Pulmonic Valve: The pulmonic valve was normal in structure. Pulmonic valve  regurgitation is not visualized. No evidence of pulmonic stenosis.   Aorta: The aortic root is normal in size and structure.   Venous: The inferior vena cava is dilated in size with less than 50%  respiratory variability, suggesting right atrial pressure of 15 mmHg.   IAS/Shunts: No atrial level shunt detected by color flow Doppler.     LEFT VENTRICLE  PLAX 2D  LVIDd:         6.30 cm  LVIDs:         4.60 cm  LV PW:         1.00 cm  LV IVS:        1.10 cm  LVOT diam:     2.30 cm   3D Volume EF:  LV SV:         80        3D EF:        50 %  LV SV Index:   46        LV EDV:       199 ml  LVOT Area:     4.15 cm  LV ESV:       99 ml                           LV SV:        99 ml   RIGHT VENTRICLE  RV Basal diam:  4.50 cm  RV Mid diam:    3.30 cm  RV S prime:     11.80 cm/s  TAPSE (M-mode): 2.0 cm   LEFT ATRIUM              Index         RIGHT ATRIUM           Index  LA diam:        8.10 cm  4.63 cm/m    RA Area:     23.00 cm  LA Vol (A2C):   238.0 ml 135.98 ml/m  RA Volume:   68.00 ml  38.85 ml/m  LA Vol (A4C):   344.0 ml 196.54 ml/m  LA Biplane Vol: 292.0 ml 166.83 ml/m   AORTIC VALVE  LVOT Vmax:   103.50 cm/s  LVOT Vmean:  65.400 cm/s  LVOT VTI:    0.194 m    AORTA  Ao Root diam: 3.00 cm  Ao Asc diam:  3.70 cm   MR Peak grad:    101.6 mmHg   TRICUSPID VALVE  MR Mean grad:    62.0 mmHg    TR Peak  grad:   37.5 mmHg  MR Vmax:         504.00 cm/s  TR Vmax:        306.00 cm/s  MR Vmean:        362.0 cm/s  MR PISA:         6.28 cm     SHUNTS  MR PISA Eff ROA: 40 mm       Systemic VTI:  0.19 m  MR PISA Radius:  1.00 cm      Systemic Diam: 2.30 cm    TEE (03/2023) IMPRESSIONS     1. Left ventricular ejection fraction, by estimation, is 50 to 55%. The  left ventricle has low normal function.   2. Right ventricular systolic function is normal.  The right ventricular  size is normal.   3. Left atrial size was massively dilated. No left atrial/left atrial  appendage thrombus was detected.   4. Right atrial size was mildly dilated.   5. A small pericardial effusion is present.   6. The mitral valve is grossly normal. Severe mitral valve regurgitation  with coaptation gap, mechanism of MR is atrial functional. The mean mitral  valve gradient is 3.7 mmHg with average heart rate of 90 bpm. No mitral  stenosis.   7. Tricuspid valve regurgitation is moderate to severe.   8. The aortic valve is tricuspid. Aortic valve regurgitation is trivial.  No aortic stenosis is present.   9. There is mild (Grade II) plaque involving the aortic arch and  descending aorta.  10. Agitated saline contrast bubble study was negative, with no evidence  of any interatrial shunt.   FINDINGS   Left Ventricle: Left ventricular ejection fraction, by estimation, is 50  to 55%. The left ventricle has low normal function. The left ventricular  internal cavity size was normal in size.   Right Ventricle: The right ventricular size is normal. No increase in  right ventricular wall thickness. Right ventricular systolic function is  normal.   Left Atrium: Left atrial size was massively dilated. No left atrial/left  atrial appendage thrombus was detected.   Right Atrium: Right atrial size was mildly dilated.   Pericardium: A small pericardial effusion is present.   Mitral Valve: The mitral valve is grossly normal. Severe mitral valve  regurgitation. No evidence of mitral valve stenosis. MV peak gradient, 5.0  mmHg. The mean mitral valve gradient is 3.7 mmHg with average heart rate  of 90 bpm.   Tricuspid Valve: The tricuspid valve is grossly normal. Tricuspid valve  regurgitation is moderate to severe.   Aortic Valve: The aortic valve is tricuspid. Aortic valve regurgitation is  trivial. No aortic stenosis is present.   Pulmonic Valve: The pulmonic valve  was grossly normal. Pulmonic valve  regurgitation is trivial.   Aorta: The aortic root and ascending aorta are structurally normal, with  no evidence of dilitation. There is mild (Grade II) plaque involving the  aortic arch and descending aorta.   Venous: A pattern of systolic flow reversal, suggestive of severe mitral  regurgitation is recorded from the right upper pulmonary vein.   IAS/Shunts: No atrial level shunt detected by color flow Doppler. Agitated  saline contrast was given intravenously to evaluate for intracardiac  shunting. Agitated saline contrast bubble study was negative, with no  evidence of any interatrial shunt.   Additional Comments: Spectral Doppler performed.   AORTIC VALVE  LVOT Vmax:   91.30 cm/s  LVOT Vmean:  60.700 cm/s  LVOT  VTI:    0.162 m    AORTA  Ao Root diam: 3.00 cm  Ao Asc diam:  3.44 cm   MITRAL VALVE                  TRICUSPID VALVE  MV Peak grad: 5.0 mmHg        TR Peak grad:   44.4 mmHg  MV Mean grad: 3.7 mmHg        TR Vmax:        333.00 cm/s  MV Vmax:      1.12 m/s  MV Vmean:     68.0 cm/s       SHUNTS  MR Peak grad:    102.4 mmHg   Systemic VTI: 0.16 m  MR Mean grad:    61.0 mmHg  MR Vmax:         506.00 cm/s  MR Vmean:        361.0 cm/s  MR PISA:         16.08 cm  MR PISA Eff ROA: 106 mm  MR PISA Radius:  1.60 cm     Recent Radiology Findings:   CATH (03/2023) Conclusion   Conclusions: Mild to moderate coronary artery disease, as detailed below, including sequential 20% proximal and distal LAD lesions, 50% small D1 disease, multifocal LCx stenoses of up to 50%, and 60-70% ostial OM2 lesion. Widely patent proximal RCA stent. Upper normal to mildly elevated left and right heart filling pressures. Normal Fick cardiac output/index. Diagnostic Dominance: Right  emo Data   Flowsheet Row Most Recent Value  Fick Cardiac Output 5.13 L/min  Fick Cardiac Output Index 2.99 (L/min)/BSA  RA A Wave 8 mmHg  RA V Wave 8 mmHg  RA  Mean 7 mmHg  RV Systolic Pressure 37 mmHg  RV Diastolic Pressure -1 mmHg  RV EDP 7 mmHg  PA Systolic Pressure 36 mmHg  PA Diastolic Pressure 15 mmHg  PA Mean 23 mmHg  PW A Wave 19 mmHg  PW V Wave 19 mmHg  PW Mean 18 mmHg  AO Systolic Pressure 88 mmHg  AO Diastolic Pressure 56 mmHg  AO Mean 69 mmHg  LV Systolic Pressure 86 mmHg  LV Diastolic Pressure 3 mmHg  LV EDP 12 mmHg  AOp Systolic Pressure 89 mmHg  AOp Diastolic Pressure 54 mmHg  AOp Mean Pressure 69 mmHg  LVp Systolic Pressure 89 mmHg  LVp Diastolic Pressure 20 mmHg  LVp EDP Pressure 11 mmHg  QP/QS 1  TPVR Index 7.7 HRUI  TSVR Index 23.11 HRUI  PVR SVR Ratio 0.08  TPVR/TSVR Ratio 0.33          Recent Lab Findings: Recent Labs       Lab Results  Component Value Date    WBC 7.4 03/01/2023    HGB 12.6 03/31/2023    HCT 37.0 03/31/2023    PLT 181.0 03/01/2023    GLUCOSE 100 (H) 03/01/2023    CHOL 142 11/28/2022    TRIG 70 11/28/2022    HDL 47 11/28/2022    LDLCALC 81 11/28/2022    ALT 11 03/01/2023    AST 14 03/01/2023    NA 139 03/31/2023    K 4.0 03/31/2023    CL 101 03/01/2023    CREATININE 0.89 03/01/2023    BUN 16 03/01/2023    CO2 32 03/01/2023    INR 1.5 (H) 03/01/2023    HGBA1C 5.7 (H) 03/29/2018  Assessment / Plan:     68 yo female with NYHA class 2 symptoms of severe MR and TR and slightly reduced LV function with moderate OM stenosis and chronic afib. I believe pt has a class I indication for surgical intervention on her MV and TV. I feel a bit concerned not seeing posterior leaflet well that a rheumatic component may be involved and not entirely Type I etiology however just places her at higher need for replacement (tissue valve). She will not benefit from maze procedure due to longevity of afib and severely dilated LA however will occlude her LA appendage at time of surgery. She does have moderate OM disease but with her cirrhosis to add CABG may be risking longer pump run and will  treat for now medically. All the risks and goals and recovery from surgery were discussed and she wishes to proceed on 06/14/2023. She will need to be off eliquis  for 5 days prior to surgery.

## 2023-06-13 NOTE — Anesthesia Preprocedure Evaluation (Signed)
 Anesthesia Evaluation  Patient identified by MRN, date of birth, ID band Patient awake    Reviewed: Allergy & Precautions, NPO status , Patient's Chart, lab work & pertinent test results, reviewed documented beta blocker date and time   History of Anesthesia Complications Negative for: history of anesthetic complications  Airway Mallampati: I  TM Distance: >3 FB Neck ROM: Full    Dental  (+) Missing, Chipped, Dental Advisory Given   Pulmonary shortness of breath, sleep apnea , COPD,  COPD inhaler, Patient abstained from smoking., former smoker   breath sounds clear to auscultation       Cardiovascular hypertension, Pt. on medications and Pt. on home beta blockers + CAD, + Past MI, + Cardiac Stents and + Orthopnea  + dysrhythmias Atrial Fibrillation + Valvular Problems/Murmurs MR  Rhythm:Irregular Rate:Normal  12/2022 ECHO: EF 50-55%, low normal LVF, normal RVF, mod pulm HTN, severe functional MR, severe TR   Neuro/Psych negative neurological ROS     GI/Hepatic ,GERD  Medicated and Controlled,,(+) Cirrhosis   ascites      Endo/Other  negative endocrine ROS    Renal/GU negative Renal ROS     Musculoskeletal   Abdominal   Peds  Hematology eliquis    Anesthesia Other Findings   Reproductive/Obstetrics                              Anesthesia Physical Anesthesia Plan  ASA: 4  Anesthesia Plan: General   Post-op Pain Management: Minimal or no pain anticipated   Induction: Intravenous  PONV Risk Score and Plan: 2 and Treatment may vary due to age or medical condition and Ondansetron   Airway Management Planned: Oral ETT  Additional Equipment: Arterial line, CVP, PA Cath, 3D TEE and Ultrasound Guidance Line Placement  Intra-op Plan:   Post-operative Plan: Post-operative intubation/ventilation  Informed Consent: I have reviewed the patients History and Physical, chart, labs and discussed  the procedure including the risks, benefits and alternatives for the proposed anesthesia with the patient or authorized representative who has indicated his/her understanding and acceptance.     Dental advisory given  Plan Discussed with: CRNA  Anesthesia Plan Comments:         Anesthesia Quick Evaluation

## 2023-06-14 ENCOUNTER — Inpatient Hospital Stay (HOSPITAL_COMMUNITY): Payer: Medicare Other

## 2023-06-14 ENCOUNTER — Encounter (HOSPITAL_COMMUNITY)
Admission: RE | Disposition: A | Discharge status: Home / Self Care | Payer: Self-pay | Source: Home / Self Care | Attending: Thoracic Surgery (Cardiothoracic Vascular Surgery)

## 2023-06-14 ENCOUNTER — Encounter (HOSPITAL_COMMUNITY): Payer: Self-pay | Admitting: Thoracic Surgery (Cardiothoracic Vascular Surgery)

## 2023-06-14 ENCOUNTER — Inpatient Hospital Stay (HOSPITAL_COMMUNITY)
Admission: RE | Admit: 2023-06-14 | Discharge: 2023-06-21 | DRG: 219 | Disposition: A | Discharge status: Home / Self Care | Payer: Medicare Other | Attending: Thoracic Surgery (Cardiothoracic Vascular Surgery) | Admitting: Thoracic Surgery (Cardiothoracic Vascular Surgery)

## 2023-06-14 ENCOUNTER — Other Ambulatory Visit: Payer: Self-pay

## 2023-06-14 ENCOUNTER — Inpatient Hospital Stay (HOSPITAL_COMMUNITY): Payer: Medicare Other | Admitting: Anesthesiology

## 2023-06-14 DIAGNOSIS — I2510 Atherosclerotic heart disease of native coronary artery without angina pectoris: Secondary | ICD-10-CM | POA: Diagnosis present

## 2023-06-14 DIAGNOSIS — Y710 Diagnostic and monitoring cardiovascular devices associated with adverse incidents: Secondary | ICD-10-CM | POA: Diagnosis not present

## 2023-06-14 DIAGNOSIS — K219 Gastro-esophageal reflux disease without esophagitis: Secondary | ICD-10-CM | POA: Diagnosis present

## 2023-06-14 DIAGNOSIS — Z955 Presence of coronary angioplasty implant and graft: Secondary | ICD-10-CM

## 2023-06-14 DIAGNOSIS — I5032 Chronic diastolic (congestive) heart failure: Secondary | ICD-10-CM | POA: Diagnosis present

## 2023-06-14 DIAGNOSIS — G4733 Obstructive sleep apnea (adult) (pediatric): Secondary | ICD-10-CM | POA: Diagnosis present

## 2023-06-14 DIAGNOSIS — Y712 Prosthetic and other implants, materials and accessory cardiovascular devices associated with adverse incidents: Secondary | ICD-10-CM | POA: Diagnosis not present

## 2023-06-14 DIAGNOSIS — R7303 Prediabetes: Secondary | ICD-10-CM | POA: Diagnosis present

## 2023-06-14 DIAGNOSIS — Z800 Family history of malignant neoplasm of digestive organs: Secondary | ICD-10-CM

## 2023-06-14 DIAGNOSIS — I340 Nonrheumatic mitral (valve) insufficiency: Secondary | ICD-10-CM | POA: Diagnosis not present

## 2023-06-14 DIAGNOSIS — J811 Chronic pulmonary edema: Secondary | ICD-10-CM | POA: Diagnosis not present

## 2023-06-14 DIAGNOSIS — F17210 Nicotine dependence, cigarettes, uncomplicated: Secondary | ICD-10-CM | POA: Diagnosis present

## 2023-06-14 DIAGNOSIS — Z780 Asymptomatic menopausal state: Secondary | ICD-10-CM

## 2023-06-14 DIAGNOSIS — I361 Nonrheumatic tricuspid (valve) insufficiency: Secondary | ICD-10-CM

## 2023-06-14 DIAGNOSIS — I4891 Unspecified atrial fibrillation: Secondary | ICD-10-CM

## 2023-06-14 DIAGNOSIS — Y831 Surgical operation with implant of artificial internal device as the cause of abnormal reaction of the patient, or of later complication, without mention of misadventure at the time of the procedure: Secondary | ICD-10-CM | POA: Diagnosis not present

## 2023-06-14 DIAGNOSIS — N179 Acute kidney failure, unspecified: Secondary | ICD-10-CM | POA: Diagnosis not present

## 2023-06-14 DIAGNOSIS — T8119XA Other postprocedural shock, initial encounter: Secondary | ICD-10-CM | POA: Diagnosis not present

## 2023-06-14 DIAGNOSIS — Z9889 Other specified postprocedural states: Secondary | ICD-10-CM | POA: Diagnosis present

## 2023-06-14 DIAGNOSIS — Z01818 Encounter for other preprocedural examination: Secondary | ICD-10-CM

## 2023-06-14 DIAGNOSIS — I252 Old myocardial infarction: Secondary | ICD-10-CM

## 2023-06-14 DIAGNOSIS — Z9842 Cataract extraction status, left eye: Secondary | ICD-10-CM

## 2023-06-14 DIAGNOSIS — D696 Thrombocytopenia, unspecified: Secondary | ICD-10-CM | POA: Diagnosis not present

## 2023-06-14 DIAGNOSIS — Z7901 Long term (current) use of anticoagulants: Secondary | ICD-10-CM

## 2023-06-14 DIAGNOSIS — Y848 Other medical procedures as the cause of abnormal reaction of the patient, or of later complication, without mention of misadventure at the time of the procedure: Secondary | ICD-10-CM | POA: Diagnosis not present

## 2023-06-14 DIAGNOSIS — Y92239 Unspecified place in hospital as the place of occurrence of the external cause: Secondary | ICD-10-CM | POA: Diagnosis not present

## 2023-06-14 DIAGNOSIS — Z961 Presence of intraocular lens: Secondary | ICD-10-CM | POA: Diagnosis present

## 2023-06-14 DIAGNOSIS — J411 Mucopurulent chronic bronchitis: Secondary | ICD-10-CM

## 2023-06-14 DIAGNOSIS — T8111XA Postprocedural  cardiogenic shock, initial encounter: Secondary | ICD-10-CM | POA: Diagnosis not present

## 2023-06-14 DIAGNOSIS — J9601 Acute respiratory failure with hypoxia: Secondary | ICD-10-CM | POA: Diagnosis not present

## 2023-06-14 DIAGNOSIS — Z79899 Other long term (current) drug therapy: Secondary | ICD-10-CM

## 2023-06-14 DIAGNOSIS — Z8249 Family history of ischemic heart disease and other diseases of the circulatory system: Secondary | ICD-10-CM

## 2023-06-14 DIAGNOSIS — I110 Hypertensive heart disease with heart failure: Secondary | ICD-10-CM | POA: Diagnosis present

## 2023-06-14 DIAGNOSIS — D62 Acute posthemorrhagic anemia: Secondary | ICD-10-CM | POA: Diagnosis not present

## 2023-06-14 DIAGNOSIS — R911 Solitary pulmonary nodule: Secondary | ICD-10-CM | POA: Diagnosis present

## 2023-06-14 DIAGNOSIS — Z8619 Personal history of other infectious and parasitic diseases: Secondary | ICD-10-CM

## 2023-06-14 DIAGNOSIS — R570 Cardiogenic shock: Secondary | ICD-10-CM

## 2023-06-14 DIAGNOSIS — I351 Nonrheumatic aortic (valve) insufficiency: Secondary | ICD-10-CM | POA: Diagnosis not present

## 2023-06-14 DIAGNOSIS — J479 Bronchiectasis, uncomplicated: Secondary | ICD-10-CM | POA: Diagnosis present

## 2023-06-14 DIAGNOSIS — I482 Chronic atrial fibrillation, unspecified: Secondary | ICD-10-CM | POA: Diagnosis present

## 2023-06-14 DIAGNOSIS — K7031 Alcoholic cirrhosis of liver with ascites: Secondary | ICD-10-CM | POA: Diagnosis present

## 2023-06-14 DIAGNOSIS — D72829 Elevated white blood cell count, unspecified: Secondary | ICD-10-CM | POA: Diagnosis present

## 2023-06-14 DIAGNOSIS — T82594A Other mechanical complication of infusion catheter, initial encounter: Secondary | ICD-10-CM | POA: Diagnosis not present

## 2023-06-14 DIAGNOSIS — Z833 Family history of diabetes mellitus: Secondary | ICD-10-CM

## 2023-06-14 DIAGNOSIS — J9602 Acute respiratory failure with hypercapnia: Secondary | ICD-10-CM | POA: Diagnosis not present

## 2023-06-14 DIAGNOSIS — Z888 Allergy status to other drugs, medicaments and biological substances status: Secondary | ICD-10-CM

## 2023-06-14 DIAGNOSIS — I081 Rheumatic disorders of both mitral and tricuspid valves: Principal | ICD-10-CM | POA: Diagnosis present

## 2023-06-14 DIAGNOSIS — Z98891 History of uterine scar from previous surgery: Secondary | ICD-10-CM

## 2023-06-14 DIAGNOSIS — Z9841 Cataract extraction status, right eye: Secondary | ICD-10-CM

## 2023-06-14 DIAGNOSIS — Z803 Family history of malignant neoplasm of breast: Secondary | ICD-10-CM

## 2023-06-14 DIAGNOSIS — Z8701 Personal history of pneumonia (recurrent): Secondary | ICD-10-CM

## 2023-06-14 DIAGNOSIS — E872 Acidosis, unspecified: Secondary | ICD-10-CM | POA: Diagnosis not present

## 2023-06-14 HISTORY — PX: TRICUSPID VALVE REPLACEMENT: SHX816

## 2023-06-14 HISTORY — PX: TEE WITHOUT CARDIOVERSION: SHX5443

## 2023-06-14 HISTORY — PX: MITRAL VALVE REPAIR: SHX2039

## 2023-06-14 HISTORY — PX: CLIPPING OF ATRIAL APPENDAGE: SHX5773

## 2023-06-14 LAB — POCT I-STAT 7, (LYTES, BLD GAS, ICA,H+H)
Acid-Base Excess: 0 mmol/L (ref 0.0–2.0)
Acid-Base Excess: 2 mmol/L (ref 0.0–2.0)
Acid-Base Excess: 2 mmol/L (ref 0.0–2.0)
Acid-Base Excess: 2 mmol/L (ref 0.0–2.0)
Acid-base deficit: 1 mmol/L (ref 0.0–2.0)
Acid-base deficit: 4 mmol/L — ABNORMAL HIGH (ref 0.0–2.0)
Acid-base deficit: 1 mmol/L (ref 0.0–2.0)
Acid-base deficit: 3 mmol/L — ABNORMAL HIGH (ref 0.0–2.0)
Bicarbonate: 27.8 mmol/L (ref 20.0–28.0)
Bicarbonate: 26.1 mmol/L (ref 20.0–28.0)
Bicarbonate: 27.4 mmol/L (ref 20.0–28.0)
Bicarbonate: 26.7 mmol/L (ref 20.0–28.0)
Bicarbonate: 25.9 mmol/L (ref 20.0–28.0)
Bicarbonate: 25.7 mmol/L (ref 20.0–28.0)
Bicarbonate: 27.3 mmol/L (ref 20.0–28.0)
Bicarbonate: 25.1 mmol/L (ref 20.0–28.0)
Calcium, Ion: 1.26 mmol/L (ref 1.15–1.40)
Calcium, Ion: 1.07 mmol/L — ABNORMAL LOW (ref 1.15–1.40)
Calcium, Ion: 1.1 mmol/L — ABNORMAL LOW (ref 1.15–1.40)
Calcium, Ion: 1.11 mmol/L — ABNORMAL LOW (ref 1.15–1.40)
Calcium, Ion: 1.15 mmol/L (ref 1.15–1.40)
Calcium, Ion: 1.22 mmol/L (ref 1.15–1.40)
Calcium, Ion: 1.22 mmol/L (ref 1.15–1.40)
Calcium, Ion: 1.18 mmol/L (ref 1.15–1.40)
HCT: 35 % — ABNORMAL LOW (ref 36.0–46.0)
HCT: 26 % — ABNORMAL LOW (ref 36.0–46.0)
HCT: 26 % — ABNORMAL LOW (ref 36.0–46.0)
HCT: 30 % — ABNORMAL LOW (ref 36.0–46.0)
HCT: 35 % — ABNORMAL LOW (ref 36.0–46.0)
HCT: 36 % (ref 36.0–46.0)
HCT: 34 % — ABNORMAL LOW (ref 36.0–46.0)
HCT: 33 % — ABNORMAL LOW (ref 36.0–46.0)
Hemoglobin: 11.9 g/dL — ABNORMAL LOW (ref 12.0–15.0)
Hemoglobin: 8.8 g/dL — ABNORMAL LOW (ref 12.0–15.0)
Hemoglobin: 8.8 g/dL — ABNORMAL LOW (ref 12.0–15.0)
Hemoglobin: 10.2 g/dL — ABNORMAL LOW (ref 12.0–15.0)
Hemoglobin: 11.9 g/dL — ABNORMAL LOW (ref 12.0–15.0)
Hemoglobin: 12.2 g/dL (ref 12.0–15.0)
Hemoglobin: 11.6 g/dL — ABNORMAL LOW (ref 12.0–15.0)
Hemoglobin: 11.2 g/dL — ABNORMAL LOW (ref 12.0–15.0)
O2 Saturation: 100 %
O2 Saturation: 100 %
O2 Saturation: 100 %
O2 Saturation: 100 %
O2 Saturation: 97 %
O2 Saturation: 93 %
O2 Saturation: 93 %
O2 Saturation: 94 %
Patient temperature: 36.5
Patient temperature: 36.5
Patient temperature: 36.5
Patient temperature: 36.4
Potassium: 3.7 mmol/L (ref 3.5–5.1)
Potassium: 4 mmol/L (ref 3.5–5.1)
Potassium: 4.3 mmol/L (ref 3.5–5.1)
Potassium: 4.3 mmol/L (ref 3.5–5.1)
Potassium: 3.7 mmol/L (ref 3.5–5.1)
Potassium: 4.1 mmol/L (ref 3.5–5.1)
Potassium: 4 mmol/L (ref 3.5–5.1)
Potassium: 3.9 mmol/L (ref 3.5–5.1)
Sodium: 138 mmol/L (ref 135–145)
Sodium: 136 mmol/L (ref 135–145)
Sodium: 137 mmol/L (ref 135–145)
Sodium: 137 mmol/L (ref 135–145)
Sodium: 138 mmol/L (ref 135–145)
Sodium: 138 mmol/L (ref 135–145)
Sodium: 138 mmol/L (ref 135–145)
Sodium: 137 mmol/L (ref 135–145)
TCO2: 30 mmol/L (ref 22–32)
TCO2: 27 mmol/L (ref 22–32)
TCO2: 29 mmol/L (ref 22–32)
TCO2: 28 mmol/L (ref 22–32)
TCO2: 27 mmol/L (ref 22–32)
TCO2: 28 mmol/L (ref 22–32)
TCO2: 29 mmol/L (ref 22–32)
TCO2: 27 mmol/L (ref 22–32)
pCO2 arterial: 56.5 mm[Hg] — ABNORMAL HIGH (ref 32–48)
pCO2 arterial: 37.9 mm[Hg] (ref 32–48)
pCO2 arterial: 47.4 mm[Hg] (ref 32–48)
pCO2 arterial: 41.7 mm[Hg] (ref 32–48)
pCO2 arterial: 49.2 mm[Hg] — ABNORMAL HIGH (ref 32–48)
pCO2 arterial: 65.4 mm[Hg] (ref 32–48)
pCO2 arterial: 64.3 mm[Hg] — ABNORMAL HIGH (ref 32–48)
pCO2 arterial: 56.1 mm[Hg] — ABNORMAL HIGH (ref 32–48)
pH, Arterial: 7.301 — ABNORMAL LOW (ref 7.35–7.45)
pH, Arterial: 7.446 (ref 7.35–7.45)
pH, Arterial: 7.37 (ref 7.35–7.45)
pH, Arterial: 7.414 (ref 7.35–7.45)
pH, Arterial: 7.326 — ABNORMAL LOW (ref 7.35–7.45)
pH, Arterial: 7.2 — ABNORMAL LOW (ref 7.35–7.45)
pH, Arterial: 7.233 — ABNORMAL LOW (ref 7.35–7.45)
pH, Arterial: 7.255 — ABNORMAL LOW (ref 7.35–7.45)
pO2, Arterial: 387 mm[Hg] — ABNORMAL HIGH (ref 83–108)
pO2, Arterial: 389 mm[Hg] — ABNORMAL HIGH (ref 83–108)
pO2, Arterial: 381 mm[Hg] — ABNORMAL HIGH (ref 83–108)
pO2, Arterial: 174 mm[Hg] — ABNORMAL HIGH (ref 83–108)
pO2, Arterial: 93 mm[Hg] (ref 83–108)
pO2, Arterial: 84 mm[Hg] (ref 83–108)
pO2, Arterial: 80 mm[Hg] — ABNORMAL LOW (ref 83–108)
pO2, Arterial: 82 mm[Hg] — ABNORMAL LOW (ref 83–108)

## 2023-06-14 LAB — PLATELET COUNT: Platelets: 124 10*3/uL — ABNORMAL LOW (ref 150–400)

## 2023-06-14 LAB — CBC
HCT: 35.7 % — ABNORMAL LOW (ref 36.0–46.0)
HCT: 35.5 % — ABNORMAL LOW (ref 36.0–46.0)
Hemoglobin: 11.7 g/dL — ABNORMAL LOW (ref 12.0–15.0)
Hemoglobin: 11.4 g/dL — ABNORMAL LOW (ref 12.0–15.0)
MCH: 31.1 pg (ref 26.0–34.0)
MCH: 30.9 pg (ref 26.0–34.0)
MCHC: 32.8 g/dL (ref 30.0–36.0)
MCHC: 32.1 g/dL (ref 30.0–36.0)
MCV: 94.9 fL (ref 80.0–100.0)
MCV: 96.2 fL (ref 80.0–100.0)
Platelets: 132 10*3/uL — ABNORMAL LOW (ref 150–400)
Platelets: 126 10*3/uL — ABNORMAL LOW (ref 150–400)
RBC: 3.76 MIL/uL — ABNORMAL LOW (ref 3.87–5.11)
RBC: 3.69 MIL/uL — ABNORMAL LOW (ref 3.87–5.11)
RDW: 14.5 % (ref 11.5–15.5)
RDW: 14.5 % (ref 11.5–15.5)
WBC: 35.1 10*3/uL — ABNORMAL HIGH (ref 4.0–10.5)
WBC: 30.6 10*3/uL — ABNORMAL HIGH (ref 4.0–10.5)
nRBC: 0 % (ref 0.0–0.2)
nRBC: 0 % (ref 0.0–0.2)

## 2023-06-14 LAB — POCT I-STAT EG7
Acid-Base Excess: 1 mmol/L (ref 0.0–2.0)
Bicarbonate: 26.4 mmol/L (ref 20.0–28.0)
Calcium, Ion: 1.11 mmol/L — ABNORMAL LOW (ref 1.15–1.40)
HCT: 24 % — ABNORMAL LOW (ref 36.0–46.0)
Hemoglobin: 8.2 g/dL — ABNORMAL LOW (ref 12.0–15.0)
O2 Saturation: 80 %
Potassium: 4.7 mmol/L (ref 3.5–5.1)
Sodium: 136 mmol/L (ref 135–145)
TCO2: 28 mmol/L (ref 22–32)
pCO2, Ven: 43.1 mm[Hg] — ABNORMAL LOW (ref 44–60)
pH, Ven: 7.396 (ref 7.25–7.43)
pO2, Ven: 45 mm[Hg] (ref 32–45)

## 2023-06-14 LAB — POCT I-STAT, CHEM 8
BUN: 21 mg/dL (ref 8–23)
BUN: 18 mg/dL (ref 8–23)
BUN: 20 mg/dL (ref 8–23)
BUN: 24 mg/dL — ABNORMAL HIGH (ref 8–23)
Calcium, Ion: 1.27 mmol/L (ref 1.15–1.40)
Calcium, Ion: 1.07 mmol/L — ABNORMAL LOW (ref 1.15–1.40)
Calcium, Ion: 1.21 mmol/L (ref 1.15–1.40)
Calcium, Ion: 1.09 mmol/L — ABNORMAL LOW (ref 1.15–1.40)
Chloride: 98 mmol/L (ref 98–111)
Chloride: 99 mmol/L (ref 98–111)
Chloride: 99 mmol/L (ref 98–111)
Chloride: 99 mmol/L (ref 98–111)
Creatinine, Ser: 0.9 mg/dL (ref 0.44–1.00)
Creatinine, Ser: 0.9 mg/dL (ref 0.44–1.00)
Creatinine, Ser: 1 mg/dL (ref 0.44–1.00)
Creatinine, Ser: 0.9 mg/dL (ref 0.44–1.00)
Glucose, Bld: 103 mg/dL — ABNORMAL HIGH (ref 70–99)
Glucose, Bld: 113 mg/dL — ABNORMAL HIGH (ref 70–99)
Glucose, Bld: 114 mg/dL — ABNORMAL HIGH (ref 70–99)
Glucose, Bld: 131 mg/dL — ABNORMAL HIGH (ref 70–99)
HCT: 33 % — ABNORMAL LOW (ref 36.0–46.0)
HCT: 23 % — ABNORMAL LOW (ref 36.0–46.0)
HCT: 31 % — ABNORMAL LOW (ref 36.0–46.0)
HCT: 28 % — ABNORMAL LOW (ref 36.0–46.0)
Hemoglobin: 11.2 g/dL — ABNORMAL LOW (ref 12.0–15.0)
Hemoglobin: 10.5 g/dL — ABNORMAL LOW (ref 12.0–15.0)
Hemoglobin: 7.8 g/dL — ABNORMAL LOW (ref 12.0–15.0)
Hemoglobin: 9.5 g/dL — ABNORMAL LOW (ref 12.0–15.0)
Potassium: 3.7 mmol/L (ref 3.5–5.1)
Potassium: 3.7 mmol/L (ref 3.5–5.1)
Potassium: 4.3 mmol/L (ref 3.5–5.1)
Potassium: 4.3 mmol/L (ref 3.5–5.1)
Sodium: 137 mmol/L (ref 135–145)
Sodium: 135 mmol/L (ref 135–145)
Sodium: 136 mmol/L (ref 135–145)
Sodium: 137 mmol/L (ref 135–145)
TCO2: 29 mmol/L (ref 22–32)
TCO2: 27 mmol/L (ref 22–32)
TCO2: 30 mmol/L (ref 22–32)
TCO2: 27 mmol/L (ref 22–32)

## 2023-06-14 LAB — BASIC METABOLIC PANEL
Anion gap: 8 (ref 5–15)
BUN: 21 mg/dL (ref 8–23)
CO2: 24 mmol/L (ref 22–32)
Calcium: 7.9 mg/dL — ABNORMAL LOW (ref 8.9–10.3)
Chloride: 104 mmol/L (ref 98–111)
Creatinine, Ser: 1.06 mg/dL — ABNORMAL HIGH (ref 0.44–1.00)
GFR, Estimated: 58 mL/min — ABNORMAL LOW (ref >60)
Glucose, Bld: 134 mg/dL — ABNORMAL HIGH (ref 70–99)
Potassium: 3.9 mmol/L (ref 3.5–5.1)
Sodium: 136 mmol/L (ref 135–145)

## 2023-06-14 LAB — ECHO INTRAOPERATIVE TEE
AR max vel: 1.7 cm2
AV Area VTI: 1.53 cm2
AV Area mean vel: 1.68 cm2
AV Mean grad: 2 mm[Hg]
AV Peak grad: 4.8 mm[Hg]
Ao pk vel: 1.1 m/s
Area-P 1/2: 3.79 cm2
Height: 62 in
MV M vel: 4.39 m/s
MV Peak grad: 77.1 mm[Hg]
MV VTI: 1.33 cm2
Radius: 1.2 cm
Weight: 2720 [oz_av]

## 2023-06-14 LAB — GLUCOSE, CAPILLARY
Glucose-Capillary: 114 mg/dL — ABNORMAL HIGH (ref 70–99)
Glucose-Capillary: 135 mg/dL — ABNORMAL HIGH (ref 70–99)
Glucose-Capillary: 123 mg/dL — ABNORMAL HIGH (ref 70–99)
Glucose-Capillary: 122 mg/dL — ABNORMAL HIGH (ref 70–99)
Glucose-Capillary: 125 mg/dL — ABNORMAL HIGH (ref 70–99)
Glucose-Capillary: 125 mg/dL — ABNORMAL HIGH (ref 70–99)
Glucose-Capillary: 123 mg/dL — ABNORMAL HIGH (ref 70–99)
Glucose-Capillary: 136 mg/dL — ABNORMAL HIGH (ref 70–99)

## 2023-06-14 LAB — ABO/RH: ABO/RH(D): A POS

## 2023-06-14 LAB — APTT: aPTT: 31 s (ref 24–36)

## 2023-06-14 LAB — PROTIME-INR
INR: 1.8 — ABNORMAL HIGH (ref 0.8–1.2)
Prothrombin Time: 20.9 s — ABNORMAL HIGH (ref 11.4–15.2)

## 2023-06-14 LAB — HEMOGLOBIN AND HEMATOCRIT, BLOOD
HCT: 25.1 % — ABNORMAL LOW (ref 36.0–46.0)
Hemoglobin: 8.2 g/dL — ABNORMAL LOW (ref 12.0–15.0)

## 2023-06-14 LAB — MAGNESIUM: Magnesium: 3 mg/dL — ABNORMAL HIGH (ref 1.7–2.4)

## 2023-06-14 SURGERY — REPAIR, MITRAL VALVE
Anesthesia: General | Site: Chest

## 2023-06-14 MED ORDER — ACETAMINOPHEN 160 MG/5ML PO SOLN
650.0000 mg | Freq: Once | ORAL | Status: AC
  Administered 2023-06-14: 650 mg
  Filled 2023-06-14: qty 20.3

## 2023-06-14 MED ORDER — SODIUM CHLORIDE 0.45 % IV SOLN: INTRAVENOUS | Status: AC | PRN

## 2023-06-14 MED ORDER — CHLORHEXIDINE GLUCONATE 0.12 % MT SOLN
15.0000 mL | Freq: Once | OROMUCOSAL | Status: AC
Start: 2023-06-14 — End: 2023-06-14
  Administered 2023-06-14: 15 mL via OROMUCOSAL
  Filled 2023-06-14: qty 15

## 2023-06-14 MED ORDER — FENTANYL CITRATE (PF) 250 MCG/5ML IJ SOLN
INTRAMUSCULAR | Status: AC
  Filled 2023-06-14: qty 5

## 2023-06-14 MED ORDER — 0.9 % SODIUM CHLORIDE (POUR BTL) OPTIME
TOPICAL | Status: DC | PRN
  Administered 2023-06-14: 5000 mL

## 2023-06-14 MED ORDER — VASOPRESSIN 20 UNIT/ML IV SOLN
INTRAVENOUS | Status: DC | PRN
  Administered 2023-06-14: 2 [IU] via INTRAVENOUS
  Administered 2023-06-14: 1 [IU] via INTRAVENOUS
  Administered 2023-06-14: 2 [IU] via INTRAVENOUS
  Administered 2023-06-14: .5 [IU] via INTRAVENOUS
  Administered 2023-06-14: 2 [IU] via INTRAVENOUS
  Administered 2023-06-14: .5 [IU] via INTRAVENOUS
  Administered 2023-06-14: 2 [IU] via INTRAVENOUS
  Administered 2023-06-14 (×2): .5 [IU] via INTRAVENOUS
  Administered 2023-06-14 (×2): 2 [IU] via INTRAVENOUS

## 2023-06-14 MED ORDER — THIAMINE MONONITRATE 100 MG PO TABS
100.0000 mg | ORAL_TABLET | Freq: Every day | ORAL | Status: DC
  Administered 2023-06-15 – 2023-06-21 (×7): 100 mg via ORAL
  Filled 2023-06-14 (×7): qty 1

## 2023-06-14 MED ORDER — VASOPRESSIN 20 UNIT/ML IV SOLN
INTRAVENOUS | Status: AC
  Filled 2023-06-14: qty 1

## 2023-06-14 MED ORDER — ORAL CARE MOUTH RINSE
15.0000 mL | OROMUCOSAL | Status: DC
  Administered 2023-06-14 – 2023-06-15 (×11): 15 mL via OROMUCOSAL

## 2023-06-14 MED ORDER — MORPHINE SULFATE (PF) 2 MG/ML IV SOLN: 1.0000 mg | INTRAVENOUS | Status: DC | PRN

## 2023-06-14 MED ORDER — IPRATROPIUM-ALBUTEROL 0.5-2.5 (3) MG/3ML IN SOLN: 3.0000 mL | RESPIRATORY_TRACT | Status: DC | PRN

## 2023-06-14 MED ORDER — SODIUM CHLORIDE 0.9 % IV SOLN: INTRAVENOUS | Status: AC

## 2023-06-14 MED ORDER — METOPROLOL TARTRATE 12.5 MG HALF TABLET
12.5000 mg | ORAL_TABLET | Freq: Two times a day (BID) | ORAL | Status: DC
  Administered 2023-06-16 – 2023-06-19 (×4): 12.5 mg via ORAL
  Filled 2023-06-14 (×5): qty 1

## 2023-06-14 MED ORDER — EPINEPHRINE HCL 5 MG/250ML IV SOLN IN NS
0.0000 ug/min | INTRAVENOUS | Status: DC
  Administered 2023-06-15: 5 ug/min via INTRAVENOUS
  Administered 2023-06-15: 9 ug/min via INTRAVENOUS
  Administered 2023-06-15: 10 ug/min via INTRAVENOUS
  Filled 2023-06-14 (×3): qty 250

## 2023-06-14 MED ORDER — CHLORHEXIDINE GLUCONATE 0.12 % MT SOLN: 15.0000 mL | Freq: Once | OROMUCOSAL | Status: DC

## 2023-06-14 MED ORDER — DM-GUAIFENESIN ER 30-600 MG PO TB12
1.0000 | ORAL_TABLET | Freq: Two times a day (BID) | ORAL | Status: DC
  Administered 2023-06-15 – 2023-06-21 (×12): 1 via ORAL
  Filled 2023-06-14 (×14): qty 1

## 2023-06-14 MED ORDER — ACETAMINOPHEN 160 MG/5ML PO SOLN
1000.0000 mg | Freq: Four times a day (QID) | ORAL | Status: AC
  Administered 2023-06-14 – 2023-06-15 (×3): 1000 mg
  Filled 2023-06-14 (×3): qty 40.6

## 2023-06-14 MED ORDER — OXYCODONE HCL 5 MG PO TABS
5.0000 mg | ORAL_TABLET | ORAL | Status: DC | PRN
Start: 2023-06-14 — End: 2023-06-16
  Administered 2023-06-15 – 2023-06-16 (×3): 5 mg via ORAL
  Filled 2023-06-14 (×3): qty 1

## 2023-06-14 MED ORDER — FENTANYL CITRATE (PF) 250 MCG/5ML IJ SOLN
INTRAMUSCULAR | Status: DC | PRN
  Administered 2023-06-14 (×2): 50 ug via INTRAVENOUS
  Administered 2023-06-14: 150 ug via INTRAVENOUS

## 2023-06-14 MED ORDER — LACTATED RINGERS IV SOLN: INTRAVENOUS | Status: DC

## 2023-06-14 MED ORDER — ASPIRIN 81 MG PO CHEW
324.0000 mg | CHEWABLE_TABLET | Freq: Once | ORAL | Status: AC
Start: 2023-06-14 — End: 2023-06-14
  Administered 2023-06-14: 324 mg via ORAL
  Filled 2023-06-14: qty 4

## 2023-06-14 MED ORDER — CHLORHEXIDINE GLUCONATE 4 % EX SOLN: 30.0000 mL | CUTANEOUS | Status: DC

## 2023-06-14 MED ORDER — SODIUM CHLORIDE 0.9 % IV SOLN: 250.0000 mL | INTRAVENOUS | Status: AC

## 2023-06-14 MED ORDER — MAGNESIUM SULFATE 4 GM/100ML IV SOLN
4.0000 g | Freq: Once | INTRAVENOUS | Status: AC
Start: 2023-06-14 — End: 2023-06-14
  Administered 2023-06-14: 4 g via INTRAVENOUS
  Filled 2023-06-14: qty 100

## 2023-06-14 MED ORDER — SODIUM CHLORIDE (PF) 0.9 % IJ SOLN
INTRAMUSCULAR | Status: AC
  Filled 2023-06-14: qty 10

## 2023-06-14 MED ORDER — INSULIN REGULAR(HUMAN) IN NACL 100-0.9 UT/100ML-% IV SOLN: INTRAVENOUS | Status: DC

## 2023-06-14 MED ORDER — PHENYLEPHRINE 80 MCG/ML (10ML) SYRINGE FOR IV PUSH (FOR BLOOD PRESSURE SUPPORT)
PREFILLED_SYRINGE | INTRAVENOUS | Status: DC | PRN
  Administered 2023-06-14 (×9): 160 ug via INTRAVENOUS

## 2023-06-14 MED ORDER — VANCOMYCIN HCL IN DEXTROSE 1-5 GM/200ML-% IV SOLN
1000.0000 mg | Freq: Once | INTRAVENOUS | Status: AC
Start: 2023-06-14 — End: 2023-06-14
  Administered 2023-06-14: 1000 mg via INTRAVENOUS
  Filled 2023-06-14: qty 200

## 2023-06-14 MED ORDER — SODIUM CHLORIDE 0.9% FLUSH
3.0000 mL | Freq: Two times a day (BID) | INTRAVENOUS | Status: DC
  Administered 2023-06-15 – 2023-06-21 (×13): 3 mL via INTRAVENOUS

## 2023-06-14 MED ORDER — SODIUM CHLORIDE (PF) 0.9 % IJ SOLN
INTRAMUSCULAR | Status: AC
  Filled 2023-06-14: qty 20

## 2023-06-14 MED ORDER — PROTAMINE SULFATE 10 MG/ML IV SOLN
INTRAVENOUS | Status: AC
  Filled 2023-06-14: qty 25

## 2023-06-14 MED ORDER — ASPIRIN 81 MG PO CHEW
324.0000 mg | CHEWABLE_TABLET | Freq: Every day | ORAL | Status: DC
  Administered 2023-06-15: 324 mg
  Filled 2023-06-14: qty 4

## 2023-06-14 MED ORDER — POTASSIUM CHLORIDE 10 MEQ/50ML IV SOLN
10.0000 meq | INTRAVENOUS | Status: AC
  Administered 2023-06-14 (×3): 10 meq via INTRAVENOUS

## 2023-06-14 MED ORDER — METOPROLOL TARTRATE 25 MG/10 ML ORAL SUSPENSION
12.5000 mg | Freq: Two times a day (BID) | ORAL | Status: DC
  Filled 2023-06-14 (×2): qty 5

## 2023-06-14 MED ORDER — ROCURONIUM BROMIDE 10 MG/ML (PF) SYRINGE
PREFILLED_SYRINGE | INTRAVENOUS | Status: DC | PRN
  Administered 2023-06-14 (×2): 100 mg via INTRAVENOUS

## 2023-06-14 MED ORDER — LIDOCAINE 2% (20 MG/ML) 5 ML SYRINGE
INTRAMUSCULAR | Status: DC | PRN
  Administered 2023-06-14: 100 mg via INTRAVENOUS

## 2023-06-14 MED ORDER — VASOPRESSIN 20 UNITS/100 ML INFUSION FOR SHOCK
0.0000 [IU]/min | INTRAVENOUS | Status: DC
  Administered 2023-06-14 – 2023-06-15 (×3): 0.03 [IU]/min via INTRAVENOUS
  Filled 2023-06-14: qty 100
  Filled 2023-06-14: qty 200
  Filled 2023-06-14: qty 100

## 2023-06-14 MED ORDER — CEFAZOLIN SODIUM-DEXTROSE 2-4 GM/100ML-% IV SOLN
2.0000 g | Freq: Three times a day (TID) | INTRAVENOUS | Status: AC
  Administered 2023-06-14 – 2023-06-16 (×6): 2 g via INTRAVENOUS
  Filled 2023-06-14 (×5): qty 100

## 2023-06-14 MED ORDER — TAB-A-VITE/IRON PO TABS
1.0000 | ORAL_TABLET | Freq: Every day | ORAL | Status: DC
  Administered 2023-06-15 – 2023-06-21 (×7): 1 via ORAL
  Filled 2023-06-14 (×7): qty 1

## 2023-06-14 MED ORDER — TRAMADOL HCL 50 MG PO TABS
50.0000 mg | ORAL_TABLET | ORAL | Status: DC | PRN
  Administered 2023-06-16 – 2023-06-19 (×3): 50 mg via ORAL
  Administered 2023-06-20 – 2023-06-21 (×2): 100 mg via ORAL
  Filled 2023-06-14 (×2): qty 2
  Filled 2023-06-14 (×3): qty 1

## 2023-06-14 MED ORDER — HEPARIN SODIUM (PORCINE) 1000 UNIT/ML IJ SOLN
INTRAMUSCULAR | Status: DC | PRN
  Administered 2023-06-14: 30000 [IU] via INTRAVENOUS

## 2023-06-14 MED ORDER — DEXTROSE 50 % IV SOLN: 0.0000 mL | INTRAVENOUS | Status: DC | PRN

## 2023-06-14 MED ORDER — ONDANSETRON HCL 4 MG/2ML IJ SOLN
4.0000 mg | Freq: Four times a day (QID) | INTRAMUSCULAR | Status: DC | PRN
Start: 2023-06-14 — End: 2023-06-21

## 2023-06-14 MED ORDER — BISACODYL 5 MG PO TBEC
10.0000 mg | DELAYED_RELEASE_TABLET | Freq: Every day | ORAL | Status: DC
  Administered 2023-06-15 – 2023-06-19 (×5): 10 mg via ORAL
  Filled 2023-06-14 (×7): qty 2

## 2023-06-14 MED ORDER — BISACODYL 10 MG RE SUPP: 10.0000 mg | Freq: Every day | RECTAL | Status: DC

## 2023-06-14 MED ORDER — PANTOPRAZOLE SODIUM 40 MG IV SOLR
40.0000 mg | Freq: Every day | INTRAVENOUS | Status: AC
  Administered 2023-06-14 – 2023-06-15 (×2): 40 mg via INTRAVENOUS
  Filled 2023-06-14 (×2): qty 10

## 2023-06-14 MED ORDER — ACETAMINOPHEN 500 MG PO TABS
1000.0000 mg | ORAL_TABLET | Freq: Four times a day (QID) | ORAL | Status: AC
  Administered 2023-06-15 – 2023-06-19 (×15): 1000 mg via ORAL
  Filled 2023-06-14 (×16): qty 2

## 2023-06-14 MED ORDER — PROTAMINE SULFATE 10 MG/ML IV SOLN
INTRAVENOUS | Status: DC | PRN
  Administered 2023-06-14: 280 mg via INTRAVENOUS
  Administered 2023-06-14: 20 mg via INTRAVENOUS

## 2023-06-14 MED ORDER — METOPROLOL TARTRATE 5 MG/5ML IV SOLN: 2.5000 mg | INTRAVENOUS | Status: DC | PRN

## 2023-06-14 MED ORDER — DEXMEDETOMIDINE HCL IN NACL 400 MCG/100ML IV SOLN: 0.0000 ug/kg/h | INTRAVENOUS | Status: DC

## 2023-06-14 MED ORDER — METOPROLOL TARTRATE 12.5 MG HALF TABLET
12.5000 mg | ORAL_TABLET | Freq: Once | ORAL | Status: DC
  Filled 2023-06-14: qty 1

## 2023-06-14 MED ORDER — NICARDIPINE HCL IN NACL 20-0.86 MG/200ML-% IV SOLN: 0.0000 mg/h | INTRAVENOUS | Status: DC

## 2023-06-14 MED ORDER — LACTATED RINGERS IV SOLN: INTRAVENOUS | Status: AC

## 2023-06-14 MED ORDER — PROPOFOL 10 MG/ML IV BOLUS
INTRAVENOUS | Status: AC
  Filled 2023-06-14: qty 20

## 2023-06-14 MED ORDER — VANCOMYCIN HCL 1000 MG IV SOLR: INTRAVENOUS | Status: DC | PRN

## 2023-06-14 MED ORDER — PROPOFOL 10 MG/ML IV BOLUS
INTRAVENOUS | Status: DC | PRN
  Administered 2023-06-14: 50 mg via INTRAVENOUS
  Administered 2023-06-14: 20 mg via INTRAVENOUS
  Administered 2023-06-14: 40 ug/kg/min via INTRAVENOUS

## 2023-06-14 MED ORDER — PANTOPRAZOLE SODIUM 40 MG PO TBEC
40.0000 mg | DELAYED_RELEASE_TABLET | Freq: Every day | ORAL | Status: DC
  Administered 2023-06-16: 40 mg via ORAL
  Filled 2023-06-14 (×2): qty 1

## 2023-06-14 MED ORDER — LACTATED RINGERS IV SOLN: INTRAVENOUS | Status: DC | PRN

## 2023-06-14 MED ORDER — ORAL CARE MOUTH RINSE: 15.0000 mL | Freq: Once | OROMUCOSAL | Status: AC

## 2023-06-14 MED ORDER — VASOPRESSIN 20 UNITS/100 ML INFUSION FOR SHOCK
INTRAVENOUS | Status: DC | PRN
  Administered 2023-06-14: .03 [IU]/min via INTRAVENOUS

## 2023-06-14 MED ORDER — NOREPINEPHRINE 4 MG/250ML-% IV SOLN
0.0000 ug/min | INTRAVENOUS | Status: DC
  Administered 2023-06-15: 6 ug/min via INTRAVENOUS
  Filled 2023-06-14: qty 250

## 2023-06-14 MED ORDER — ALBUMIN HUMAN 5 % IV SOLN: INTRAVENOUS | Status: DC | PRN

## 2023-06-14 MED ORDER — CHLORHEXIDINE GLUCONATE 0.12 % MT SOLN
15.0000 mL | OROMUCOSAL | Status: AC
  Administered 2023-06-14: 15 mL via OROMUCOSAL
  Filled 2023-06-14: qty 15

## 2023-06-14 MED ORDER — MIDAZOLAM HCL 2 MG/2ML IJ SOLN: 2.0000 mg | INTRAMUSCULAR | Status: DC | PRN

## 2023-06-14 MED ORDER — ALBUMIN HUMAN 5 % IV SOLN
250.0000 mL | INTRAVENOUS | Status: AC | PRN
  Administered 2023-06-14 – 2023-06-15 (×5): 12.5 g via INTRAVENOUS
  Filled 2023-06-14 (×4): qty 250

## 2023-06-14 MED ORDER — SODIUM CHLORIDE 0.9 % IV SOLN: INTRAVENOUS | Status: DC | PRN

## 2023-06-14 MED ORDER — MIDAZOLAM HCL (PF) 10 MG/2ML IJ SOLN
INTRAMUSCULAR | Status: AC
  Filled 2023-06-14: qty 2

## 2023-06-14 MED ORDER — HEPARIN SODIUM (PORCINE) 1000 UNIT/ML IJ SOLN
INTRAMUSCULAR | Status: AC
  Filled 2023-06-14: qty 1

## 2023-06-14 MED ORDER — ~~LOC~~ CARDIAC SURGERY, PATIENT & FAMILY EDUCATION
Freq: Once | Status: DC
  Filled 2023-06-14: qty 1

## 2023-06-14 MED ORDER — ALBUTEROL SULFATE HFA 108 (90 BASE) MCG/ACT IN AERS
INHALATION_SPRAY | RESPIRATORY_TRACT | Status: DC | PRN
  Administered 2023-06-14: 6 via RESPIRATORY_TRACT

## 2023-06-14 MED ORDER — SUGAMMADEX SODIUM 200 MG/2ML IV SOLN
INTRAVENOUS | Status: DC | PRN
  Administered 2023-06-14: 200 mg via INTRAVENOUS

## 2023-06-14 MED ORDER — ASPIRIN 325 MG PO TBEC: 325.0000 mg | DELAYED_RELEASE_TABLET | Freq: Every day | ORAL | Status: DC

## 2023-06-14 MED ORDER — ARFORMOTEROL TARTRATE 15 MCG/2ML IN NEBU
15.0000 ug | INHALATION_SOLUTION | Freq: Two times a day (BID) | RESPIRATORY_TRACT | Status: DC
  Administered 2023-06-14 – 2023-06-21 (×14): 15 ug via RESPIRATORY_TRACT
  Filled 2023-06-14 (×14): qty 2

## 2023-06-14 MED ORDER — FOLIC ACID 1 MG PO TABS
1.0000 mg | ORAL_TABLET | Freq: Every day | ORAL | Status: DC
  Administered 2023-06-15 – 2023-06-21 (×7): 1 mg via ORAL
  Filled 2023-06-14 (×7): qty 1

## 2023-06-14 MED ORDER — ORAL CARE MOUTH RINSE: 15.0000 mL | OROMUCOSAL | Status: DC | PRN

## 2023-06-14 MED ORDER — MIDAZOLAM HCL (PF) 5 MG/ML IJ SOLN
INTRAMUSCULAR | Status: DC | PRN
  Administered 2023-06-14 (×2): 1 mg via INTRAVENOUS

## 2023-06-14 MED ORDER — METHADONE HCL IV SYRINGE 10 MG/ML FOR CABG
0.3000 mg/kg | Freq: Once | INTRAMUSCULAR | Status: AC
Start: 2023-06-14 — End: 2023-06-14
  Administered 2023-06-14: 15 mg via INTRAVENOUS
  Filled 2023-06-14: qty 1.5

## 2023-06-14 MED ORDER — METOCLOPRAMIDE HCL 5 MG/ML IJ SOLN
10.0000 mg | Freq: Four times a day (QID) | INTRAMUSCULAR | Status: AC
  Administered 2023-06-14 – 2023-06-15 (×6): 10 mg via INTRAVENOUS
  Filled 2023-06-14 (×5): qty 2

## 2023-06-14 MED ORDER — SODIUM CHLORIDE 0.9% FLUSH: 3.0000 mL | INTRAVENOUS | Status: DC | PRN

## 2023-06-14 MED ORDER — DOCUSATE SODIUM 100 MG PO CAPS
200.0000 mg | ORAL_CAPSULE | Freq: Every day | ORAL | Status: DC
  Administered 2023-06-16 – 2023-06-21 (×5): 200 mg via ORAL
  Filled 2023-06-14 (×7): qty 2

## 2023-06-14 MED ORDER — OXYCODONE HCL 5 MG PO TABS: 5.0000 mg | ORAL_TABLET | ORAL | Status: DC | PRN

## 2023-06-14 MED ORDER — CHLORHEXIDINE GLUCONATE CLOTH 2 % EX PADS
6.0000 | MEDICATED_PAD | Freq: Every day | CUTANEOUS | Status: DC
  Administered 2023-06-14 – 2023-06-21 (×8): 6 via TOPICAL

## 2023-06-14 MED ORDER — SODIUM CHLORIDE 3 % IN NEBU
4.0000 mL | INHALATION_SOLUTION | Freq: Two times a day (BID) | RESPIRATORY_TRACT | Status: AC
  Administered 2023-06-14 – 2023-06-17 (×6): 4 mL via RESPIRATORY_TRACT
  Filled 2023-06-14 (×6): qty 4

## 2023-06-14 MED ORDER — REVEFENACIN 175 MCG/3ML IN SOLN
175.0000 ug | Freq: Every day | RESPIRATORY_TRACT | Status: DC
  Administered 2023-06-14 – 2023-06-21 (×8): 175 ug via RESPIRATORY_TRACT
  Filled 2023-06-14 (×8): qty 3

## 2023-06-14 SURGICAL SUPPLY — 79 items
ADAPTER CARDIO PERF ANTE/RETRO (ADAPTER) IMPLANT
ANTIFOG SOL W/FOAM PAD STRL (MISCELLANEOUS) ×2
BAG DECANTER FOR FLEXI CONT (MISCELLANEOUS) ×2 IMPLANT
BLADE CLIPPER SURG (BLADE) ×2 IMPLANT
BLADE STERNUM SYSTEM 6 (BLADE) ×2 IMPLANT
CANISTER SUCT 3000ML PPV (MISCELLANEOUS) ×2 IMPLANT
CANN PRFSN 3/8XCNCT ST RT ANG (MISCELLANEOUS) ×2
CANNULA NON VENT 20FR 12 (CANNULA) ×2 IMPLANT
CANNULA NON VENT 22FR 12 (CANNULA) ×2 IMPLANT
CANNULA PRFSN 3/8XCNCT RT ANG (MISCELLANEOUS) IMPLANT
CANNULA SUMP PERICARDIAL (CANNULA) ×2 IMPLANT
CANNULA VRC MALB SNGL STG 36FR (MISCELLANEOUS) IMPLANT
CATH HEART VENT LEFT (CATHETERS) ×2 IMPLANT
CATH RETROPLEGIA CORONARY 14FR (CATHETERS) IMPLANT
CATH ROBINSON RED A/P 18FR (CATHETERS) ×6 IMPLANT
CATH THOR STR 32F SOFT 20 RADI (CATHETERS) ×2 IMPLANT
CATH THORACIC 28FR RT ANG (CATHETERS) ×2 IMPLANT
CIRCUIT VACPAC SAFETY (MISCELLANEOUS) IMPLANT
CLAMP SUTURE YELLOW 5 PAIRS (MISCELLANEOUS) ×2 IMPLANT
CLIP TI MEDIUM 24 (CLIP) IMPLANT
CLIP TI WIDE RED SMALL 24 (CLIP) IMPLANT
CONNECTOR 1/2X3/8X1/2 3WAY (MISCELLANEOUS) IMPLANT
DEVICE SUT CK QUICK LOAD MINI (Prosthesis & Implant Heart) IMPLANT
DRAPE CV SPLIT W-CLR ANES SCRN (DRAPES) ×2 IMPLANT
DRAPE INCISE IOBAN 66X45 STRL (DRAPES) ×2 IMPLANT
DRAPE PERI GROIN 82X75IN TIB (DRAPES) ×2 IMPLANT
DRSG AQUACEL AG ADV 3.5X10 (GAUZE/BANDAGES/DRESSINGS) ×2 IMPLANT
ELECT CAUTERY BLADE 6.4 (BLADE) ×2 IMPLANT
ELECT REM PT RETURN 9FT ADLT (ELECTROSURGICAL) ×4
ELECTRODE REM PT RTRN 9FT ADLT (ELECTROSURGICAL) ×4 IMPLANT
FELT TEFLON 1X6 (MISCELLANEOUS) ×2 IMPLANT
GAUZE SPONGE 4X4 12PLY STRL (GAUZE/BANDAGES/DRESSINGS) ×2 IMPLANT
GOWN STRL REUS W/ TWL LRG LVL3 (GOWN DISPOSABLE) ×12 IMPLANT
HEMOSTAT SURGICEL 2X14 (HEMOSTASIS) IMPLANT
INSERT FOGARTY XLG (MISCELLANEOUS) ×2 IMPLANT
IRRIG SUCT STRYKERFLOW 2 WTIP (MISCELLANEOUS) ×2
IRRIGATION SUCT STRKRFLW 2 WTP (MISCELLANEOUS) IMPLANT
KIT BASIN OR (CUSTOM PROCEDURE TRAY) ×2 IMPLANT
KIT SUT CK MINI COMBO 4X17 (Prosthesis & Implant Heart) IMPLANT
KIT TURNOVER KIT B (KITS) ×2 IMPLANT
LINE VENT (MISCELLANEOUS) IMPLANT
NDL SUT 1 .5 CRC FRENCH EYE (NEEDLE) IMPLANT
NS IRRIG 1000ML POUR BTL (IV SOLUTION) ×12 IMPLANT
ORGANIZER SUTURE GABBAY-FRATER (MISCELLANEOUS) ×2 IMPLANT
PACK E OPEN HEART (SUTURE) ×2 IMPLANT
PACK OPEN HEART (CUSTOM PROCEDURE TRAY) ×2 IMPLANT
PAD ARMBOARD 7.5X6 YLW CONV (MISCELLANEOUS) ×4 IMPLANT
PAD ELECT DEFIB RADIOL ZOLL (MISCELLANEOUS) ×2 IMPLANT
PENCIL BUTTON HOLSTER BLD 10FT (ELECTRODE) ×2 IMPLANT
POSITIONER HEAD DONUT 9IN (MISCELLANEOUS) ×2 IMPLANT
RING ANLPLS SIMUFORM 32 (Prosthesis & Implant Heart) IMPLANT
RING TRICUSPID T32 (Prosthesis & Implant Heart) IMPLANT
SET MPS 3-ND DEL (MISCELLANEOUS) IMPLANT
SOLUTION ANTFG W/FOAM PAD STRL (MISCELLANEOUS) ×2 IMPLANT
SPONGE T-LAP 18X18 ~~LOC~~+RFID (SPONGE) IMPLANT
SUT ETHIBOND 3 0 SH 1 (SUTURE) IMPLANT
SUT MNCRL AB 4-0 PS2 18 (SUTURE) ×4 IMPLANT
SUT PROLENE 4 0 SH DA (SUTURE) ×6 IMPLANT
SUT PROLENE 4-0 RB1 .5 CRCL 36 (SUTURE) ×4 IMPLANT
SUT PROLENE 5 0 RB 2 (SUTURE) IMPLANT
SUT STEEL 6MS V (SUTURE) ×2 IMPLANT
SUT STEEL SZ 6 DBL 3X14 BALL (SUTURE) ×4 IMPLANT
SUT VIC AB 0 CTX36XBRD ANTBCTR (SUTURE) ×4 IMPLANT
SUT VIC AB 2-0 CT1 TAPERPNT 27 (SUTURE) ×4 IMPLANT
SYR BULB IRRIG 60ML STRL (SYRINGE) IMPLANT
SYS CLIP PENDITURE LAA 50 (Clip) ×2 IMPLANT
SYSTEM CLIP PENDITURE LAA 50 (Clip) IMPLANT
SYSTEM SAHARA CHEST DRAIN ATS (WOUND CARE) ×2 IMPLANT
TAG SUTURE CLAMP YLW 5PR (MISCELLANEOUS) ×2
TAPE PAPER 2X10 WHT MICROPORE (GAUZE/BANDAGES/DRESSINGS) IMPLANT
TAPE PAPER 3X10 WHT MICROPORE (GAUZE/BANDAGES/DRESSINGS) IMPLANT
TOWEL GREEN STERILE (TOWEL DISPOSABLE) ×2 IMPLANT
TOWEL GREEN STERILE FF (TOWEL DISPOSABLE) ×2 IMPLANT
TRAY FOLEY SLVR 16FR TEMP STAT (SET/KITS/TRAYS/PACK) ×2 IMPLANT
TUBE CONNECTING 20X1/4 (TUBING) IMPLANT
UNDERPAD 30X36 HEAVY ABSORB (UNDERPADS AND DIAPERS) ×2 IMPLANT
VENT LEFT HEART 12002 (CATHETERS) ×2
VRC MALLEABLE SINGLE STG 36FR (MISCELLANEOUS) ×2
WATER STERILE IRR 1000ML POUR (IV SOLUTION) ×4 IMPLANT

## 2023-06-14 NOTE — Anesthesia Postprocedure Evaluation (Signed)
 Anesthesia Post Note  Patient: Rhonda [name]  Procedure(s) Performed: MITRAL VALVE REPAIR USING 32 MM SMUFORM SEMI-RIGID ANNULOPLASTY RING (Chest) TRICUSPID VALVE REPAIR USING 32 MM EDWARDS MC3 TRICUSPID ANNULOPLASTY RING CLIPPING OF ATRIAL APPENDAGE USING 50 MM MEDTRONIC PENDITURE LAA CLIP TRANSESOPHAGEAL ECHOCARDIOGRAM     Patient location during evaluation: SICU Anesthesia Type: General Level of consciousness: sedated Pain management: pain level controlled Vital Signs Assessment: post-procedure vital signs reviewed and stable Respiratory status: patient remains intubated per anesthesia plan Cardiovascular status: stable Postop Assessment: no apparent nausea or vomiting Anesthetic complications: no   No notable events documented.  Last Vitals:  Vitals:   06/14/23 0703 06/14/23 1235  BP: 115/78   Pulse: 86   Resp: 18   Temp: 36.6 C   SpO2: (!) 89% 95%    Last Pain:  Vitals:   06/14/23 0714  TempSrc:   PainSc: 0-No pain                 Thom JONELLE Peoples

## 2023-06-14 NOTE — Anesthesia Procedure Notes (Signed)
 Procedure Name: Intubation Date/Time: 06/14/2023 8:42 AM  Performed by: Joshua Millman, CRNAPre-anesthesia Checklist: Patient identified, Emergency Drugs available, Suction available and Patient being monitored Patient Re-evaluated:Patient Re-evaluated prior to induction Oxygen Delivery Method: Circle system utilized Preoxygenation: Pre-oxygenation with 100% oxygen Induction Type: IV induction Ventilation: Mask ventilation without difficulty and Oral airway inserted - appropriate to patient size Laryngoscope Size: Mac and 3 Grade View: Grade I Tube type: Oral Tube size: 8.0 mm Number of attempts: 1 Airway Equipment and Method: Stylet and Oral airway Placement Confirmation: ETT inserted through vocal cords under direct vision, positive ETCO2 and breath sounds checked- equal and bilateral Secured at: 22 cm Tube secured with: Tape Dental Injury: Teeth and Oropharynx as per pre-operative assessment

## 2023-06-14 NOTE — Interval H&P Note (Signed)
 History and Physical Interval Note:  06/14/2023 6:45 AM  Rhonda [name]  has presented today for surgery, with the diagnosis of MR TR AFIB.  The various methods of treatment have been discussed with the patient and family. After consideration of risks, benefits and other options for treatment, the patient has consented to  Procedure(s): MITRAL VALVE REPAIR, possible replacement (MVR) (N/A) TRICUSPID VALVE REPAIR (N/A) CLIPPING OF ATRIAL APPENDAGE (N/A) TRANSESOPHAGEAL ECHOCARDIOGRAM (N/A) as a surgical intervention.  The patient's history has been reviewed, patient examined, no change in status, stable for surgery.  I have reviewed the patient's chart and labs.  Questions were answered to the patient's satisfaction.     Deward Kallman

## 2023-06-14 NOTE — Op Note (Signed)
 CARDIOVASCULAR SURGERY OPERATIVE NOTE  06/14/2023 Rhonda [name] [id number]  Surgeon:  Deward LELON Kallman, MD  First Assistant: Lemond Cera [facility]                               An experienced assistant was required given the complexity of this surgery and the standard of surgical care. The assistant was needed for exposure, dissection, suctioning, retraction of delicate tissues and sutures, instrument exchange and for overall help during this procedure.     Preoperative Diagnosis:  Severe Mitral and tricuspid regurgitation  Postoperative Diagnosis:  Same   Procedure: Mitral valve ring annuloplasty with a 32mm Simuform complete annuloplasty ring Tricuspid valve ring annuloplasty with a 32 mm MC3 tricuspid annuloplasty ring Left atrial appendage occlusion with a 50mm Medtronic penditure clip  Anesthesia:  General Endotracheal   Clinical History/Surgical Indication:  68 yo female with NYHA class 2 symptoms of severe MR and TR and slightly reduced LV function with moderate OM stenosis and chronic afib. I believe pt has a class I indication for surgical intervention on her MV and TV. I feel a bit concerned not seeing posterior leaflet well that a rheumatic component may be involved and not entirely Type I etiology however just places her at higher need for replacement (tissue valve)   Findings: The mitral and tricuspid valves had type I annular dilatation is the primary mode of regurgitation.  There is also appearance of asymmetrical formation of the posterior annulus of the mitral valve similar to what is seen in ischemic's with downward retraction of the P3 segment.  The conclusion of the procedure there was mild mitral regurgitation with mild to trace tricuspid regurgitation and mild to moderate right ventricular dysfunction.  Left atrial appendage had been obliterated.  Preparation:  The patient was seen in the preoperative holding area and the correct patient, correct operation were  confirmed with the patient after reviewing the medical record and catheterization. The consent was signed by me. Preoperative antibiotics were given. A pulmonary arterial line and radial arterial line were placed by the anesthesia team. The patient was taken back to the operating room and positioned supine on the operating room table. After being placed under general endotracheal anesthesia by the anesthesia team a foley catheter was placed. The neck, chest, abdomen, and both legs were prepped with betadine soap and solution and draped in the usual sterile manner. A surgical time-out was taken and the correct patient and operative procedure were confirmed with the nursing and anesthesia staff.   Operation: A median sternotomy incision was then created and sternal divided with a sternal saw.  Simultaneously heparin  was delivered and a pericardial well created. The aorta was cannulated with a 20 French Starns aortic cannula and a 36 French straight cannula was placed in the right atrial appendage and directed towards the inferior vena cava.  An additional 31 French right angle cannula was placed in the superior vena cava.  Both cavae were controlled with cable tourniquets. With adequate confirmation of anticoagulation cardiopulmonary bypass was instituted.  An antegrade cardioplegia catheter was placed in the ascending aorta and the aortic cross-clamp was placed and 1100 cc of cold blood [place] cardioplegia was delivered antegrade.  A reanimation dose was delivered as part of cross-clamp removal. Left atrial appendage was then occluded with the Medtronic atrial clip successfully. The left atrium was then opened in the atrial atrial groove and a very large left atrium  was encountered.  There appeared to be no evidence of rheumatic changes of the mitral valve and therefore 3-0 Ethibond sutures were placed circle annularly and the valve was sized to a 32 mm complete Medtronic ring.  This was secured with the  cor knot system. The left atrium was then closed over ventricular sump with running 4-0 Prolene suture.  The cavae tourniquets were then secured in the right atrium opened.  Again there was type I dilatation of the annulus and 0 Ethibond sutures were placed in the appropriate areas in anticipation of the MC3 ring.  This was secured with the cor knot system and on saline load test had adequate coaptation of the leaflets. The right atrium was then closed with a running 4-0 Prolene suture.  With the patient in headdown position aortic cross-clamp was removed. Ventricular pacing wires were brought out the inferior stab wounds and secured.  Following adequate de-airing the left ventricular sump was removed and the patient was weaned from cardiopulmonary bypass on a small amount of epinephrine  support.  With adequate hemodynamics protamine  was delivered and the patient was decannulated and sites oversewn were necessary.  Chest tubes were brought inferior stab was and secured and sternum was reapproximated with interrupted stainless steel wire and the presternal subcutaneous tissue and skin were closed multilayer's absorbable suture.  Sterile dressings were applied.

## 2023-06-14 NOTE — Anesthesia Procedure Notes (Signed)
 Arterial Line Insertion Start/End1/01/2024 7:50 AM, 06/14/2023 7:55 AM Performed by: Joshua Millman, CRNA, CRNA  Patient location: Pre-op. Preanesthetic checklist: patient identified, IV checked, site marked, risks and benefits discussed, surgical consent, monitors and equipment checked, pre-op evaluation, timeout performed and anesthesia consent Lidocaine  1% used for infiltration Left, radial was placed Catheter size: 20 G Hand hygiene performed , maximum sterile barriers used  and Seldinger technique used Allen's test indicative of satisfactory collateral circulation Attempts: 1 Procedure performed without using ultrasound guided technique. Following insertion, dressing applied. Post procedure assessment: normal and unchanged  Post procedure complications: local hematoma. Patient tolerated the procedure well with no immediate complications.

## 2023-06-14 NOTE — Anesthesia Procedure Notes (Signed)
 Central Venous Catheter Insertion Performed by: Erma Thom SAUNDERS, MD, anesthesiologist Start/End1/01/2024 7:43 AM, 06/14/2023 7:55 AM Patient location: Pre-op. Preanesthetic checklist: patient identified, IV checked, site marked, risks and benefits discussed, surgical consent, monitors and equipment checked, pre-op evaluation, timeout performed and anesthesia consent Lidocaine  1% used for infiltration and patient sedated Hand hygiene performed  and maximum sterile barriers used  Catheter size: 8.5 Fr Sheath introducer Procedure performed using ultrasound guided technique. Ultrasound Notes:anatomy identified, needle tip was noted to be adjacent to the nerve/plexus identified, no ultrasound evidence of intravascular and/or intraneural injection and image(s) printed for medical record Attempts: 1 Following insertion, line sutured and dressing applied. Post procedure assessment: blood return through all ports, free fluid flow and no air  Patient tolerated the procedure well with no immediate complications.

## 2023-06-14 NOTE — Transfer of Care (Signed)
 Immediate Anesthesia Transfer of Care Note  Patient: Rhonda [name]  Procedure(s) Performed: MITRAL VALVE REPAIR USING 32 MM SMUFORM SEMI-RIGID ANNULOPLASTY RING (Chest) TRICUSPID VALVE REPAIR USING 32 MM EDWARDS MC3 TRICUSPID ANNULOPLASTY RING CLIPPING OF ATRIAL APPENDAGE USING 50 MM MEDTRONIC PENDITURE LAA CLIP TRANSESOPHAGEAL ECHOCARDIOGRAM  Patient Location: ICU  Anesthesia Type:[name]  Level of Consciousness: Patient remains intubated per anesthesia plan  Airway & Oxygen Therapy: Patient remains intubated per anesthesia plan and Patient placed on Ventilator (see vital sign flow sheet for setting)  Post-op Assessment: Report given to RN and Post -op Vital signs reviewed and stable  Post vital signs: Reviewed and stable  Last Vitals:  Vitals Value Taken Time  BP    Temp 36.5 C 06/14/23 1251  Pulse 74 06/14/23 1251  Resp 14 06/14/23 1251  SpO2 96 % 06/14/23 1251  Vitals shown include unfiled device data.  Last Pain:  Vitals:   06/14/23 0714  TempSrc:   PainSc: 0-No pain         Complications: No notable events documented.

## 2023-06-14 NOTE — Brief Op Note (Signed)
 06/14/2023  12:53 PM  PATIENT:  Rhonda [name]  68 y.o. female  PRE-OPERATIVE DIAGNOSIS:  SEVERE MITRAL REGURGITATTION, TRICUSPID REGURGITATION, ATRIAL FIBRILLATION  POST-OPERATIVE DIAGNOSIS:  SEVERE MITRAL REGURGITATTION, TRICUSPID REGURGITATION, ATRIAL FIBRILLATION  PROCEDURE:  Procedure(s): MITRAL VALVE REPAIR USING 32 MM SMUFORM SEMI-RIGID ANNULOPLASTY RING (N/A) TRICUSPID VALVE REPAIR USING 32 MM EDWARDS MC3 TRICUSPID ANNULOPLASTY RING (N/A) CLIPPING OF ATRIAL APPENDAGE USING 50 MM MEDTRONIC PENDITURE LAA CLIP (N/A) TRANSESOPHAGEAL ECHOCARDIOGRAM (N/A)  SURGEON:  Surgeons and Role:    DEWAINE Maryjane Mt, MD - Primary  PHYSICIAN ASSISTANT: Elbia Paro PA-C  ASSISTANTS: LUKE MAYOTTE RNFA   ANESTHESIA:   general  EBL:  1080 mL   BLOOD ADMINISTERED:none  DRAINS:  MEDIASTINAL CHEST TUBES(2)    LOCAL MEDICATIONS USED:  NONE  SPECIMEN:  No Specimen  DISPOSITION OF SPECIMEN:  N/A  COUNTS:  YES  TOURNIQUET:  * No tourniquets in log *  DICTATION: .Dragon Dictation  PLAN OF CARE: Admit to inpatient   PATIENT DISPOSITION:  ICU - intubated and hemodynamically stable.   Delay start of Pharmacological VTE agent (>24hrs) due to surgical blood loss or risk of bleeding: yes  COMPLICATIONS: NO KNOWN

## 2023-06-14 NOTE — Consult Note (Signed)
 NAME:  Leiloni Smithers, MRN:  [id number], DOB:  06/17/1955, LOS: 0 ADMISSION DATE:  06/14/2023, CONSULTATION DATE:  1/8 REFERRING MD:  1/8, CHIEF COMPLAINT:  s/p MVR and TVR  History of Present Illness:  Patient is a 68 yo F w/ pertinent PMH severe MR and TR, diastolic CHF, alcoholic cirrhosis w/ mild varices (meld score 11), afib on eliquis , HTN, CAD s/p RCA stent 2011, OSA presents to [facility] on 1/8 for elective MVR/TVR.  Followed by cardiology outpt. Patient having worsening dyspnea and edema. Echo with torrential MVR and severe TVR. Cath showing moderate OM. TCTS consulted and recommend surgery. On 1/8 patient came in for elective MVR/TVR. Post-op intubated/sedated. PCCM consulted for ICU care.  In pre-op she had a productive cough and had wheezing post intubation that responded to bronchodilators. Norva was not able to be advanced and is measuring a CVP currently. RV function mildly reduced post-op, started on epi. Transferred on epi, NE, vasopressin . Reversed paralytics in ICU.    EBL 1080, cell saver 602cc Bypass time 1:30 Cross clamp time 1:15   Pertinent  Medical History   Past Medical History:  Diagnosis Date   Allergy    Atrial fibrillation (HCC)    CAD (coronary artery disease) 08/2009   BMS to RCA, non-obstructive in CX and LAD 2011   Cataract    CHF (congestive heart failure) (HCC)    Cirrhosis (HCC)    Dysrhythmia    A. Fib   Edema, peripheral    Heart murmur    Hypertension    Myocardial infarction [facility])    Nausea    Pneumonia    January 2020   Sleep apnea    No CPAP   Trichomoniasis 06/05/2018     Significant Hospital Events: Including procedures, antibiotic start and stop dates in addition to other pertinent events   1/8 post op MVR/TVR; pccm consulted   Interim History / Subjective:    Objective   Blood pressure 115/78, pulse 86, temperature 97.8 F (36.6 C), temperature source Oral, resp. rate 18, height 5' 2 (1.575 m), weight 77.1 kg, SpO2  (!) 89%.        Intake/Output Summary (Last 24 hours) at 06/14/2023 1103 Last data filed at 06/14/2023 1030 Gross per 24 hour  Intake 1251.5 ml  Output 100 ml  Net 1151.5 ml   Filed Weights   06/14/23 0703  Weight: 77.1 kg   Examination: General: critically ill appearing woman lying in bed in NAD HEENT: [place]/AT, eyes anicteric Neuro: RASS -5, pinpoint pupils CV: S1S2, paced rhythm, minimal chest tube output PULM:  wheezing bilaterally, minimal ETT secretions  GI: soft, NT Extremities: + pitting edema Skin: warm, dry, no rashes  CXR personally reviewed>  mild pulmonary edema, RIJ introducer with swan retracted into catheter. ETT 2 cm above carina.   EKG: pending Post op labs pending   CT chest 1/6 personally reviewed> R dependent effusion, mild RML consolidation with air bronchograms, peripheral nodule, possible mucus plugging in proximal segmental bronchi medially. LLL bronchiectasis.   No PFTs on file.    Resolved Hospital Problem list     Assessment & Plan:  Severe MR, s/p MV repair, TR s/p tricuspid repair. -post-op care per TCTS -complete post op antibiotics -pain control per protocol- morphine , tramadol , oxycodone  -aspirin , statin daily -start metoprolol  once weaned off vasopressors -monitor chest tube output -pacing dependent currently; may need amio for rare control -Wean pressors & inotropes as able to maintain MAP >65; wean vaso &  NE before weaning epi.  H/o CAD, s/p DES 2011 H/o HFpEF HTN -hold PTA metoprolol , spiro, digoxin , dilt, metoprolol   Valvular Afib, chronic -hold AC -on diltiazem , metoprolol , and digoxin  PTA- anticipate difficulty with rate control  Post-op vent management Chronic R pleural effusion likely due to cirrhosis, POA COPD, bronchiectasis seen on CT H/o OSA -rapid wean  -VAP prevention protocol -pulmonary hygiene- adding hypertonic saline BID & mucinex  to thin secretions. Need to reinforce sternal precautions with her chronic  cough. -brovana  & yupelri   Hyperglycemia; h/o prediabetes. A1c 6.2. -insulin  gtt per protocol  H/o tobacco abuse; quit 06/2023 RML nodule peripherally -recommend sticking with tobacco cessation -recommend follow up CT as OP  H/o GERD -con't PPI  Alcoholic cirrhosis w/ mild varices (meld score 11) -monitor volume status closely -avoid hepatically metabolized meds -resume lasix  & spiro for cirrhosis/ ascites control when able -monitor for encephalopathy -no baseline anemia or thrombocytopenia -vitamins, unsure when her last drink was   Best Practice (right click and Reselect all SmartList Selections daily)   Diet/type: NPO w/ meds via tube DVT prophylaxis: SCD GI prophylaxis: PPI Lines: Central line and Arterial Line Foley:  Yes, and it is still needed Code Status:  full code Last date of multidisciplinary goals of care discussion [per primary]  Labs   CBC: Recent Labs  Lab 06/12/23 1200 06/14/23 0849 06/14/23 0948 06/14/23 0955 06/14/23 1019 06/14/23 1042 06/14/23 1050  WBC 8.2  --   --   --   --   --   --   HGB 12.7   < > 8.8* 8.2* 7.8* 8.2* 8.8*  HCT 39.9   < > 26.0* 24.0* 23.0* 25.1* 26.0*  MCV 95.9  --   --   --   --   --   --   PLT 191  --   --   --   --  124*  --    < > = values in this interval not displayed.    Basic Metabolic Panel: Recent Labs  Lab 06/12/23 1200 06/14/23 0849 06/14/23 0856 06/14/23 0930 06/14/23 0948 06/14/23 0955 06/14/23 1019 06/14/23 1050  NA 138 137   < > 136 136 136 135 137  K 4.5 3.7   < > 3.7 4.0 4.7 4.3 4.3  CL 100 98  --  99  --   --  99  --   CO2 30  --   --   --   --   --   --   --   GLUCOSE 123* 103*  --  114*  --   --  113*  --   BUN 15 21  --  20  --   --  18  --   CREATININE 0.96 0.90  --  1.00  --   --  0.90  --   CALCIUM  9.4  --   --   --   --   --   --   --    < > = values in this interval not displayed.   GFR: Estimated Creatinine Clearance: 59.9 mL/min (by C-G formula based on SCr of 0.9  mg/dL). Recent Labs  Lab 06/12/23 1200  WBC 8.2    Liver Function Tests: Recent Labs  Lab 06/12/23 1200  AST 19  ALT 13  ALKPHOS 67  BILITOT 1.2  PROT 6.5  ALBUMIN  3.5   No results for input(s): LIPASE, AMYLASE in the last 168 hours. No results for input(s): AMMONIA in the last 168  hours.  ABG    Component Value Date/Time   PHART 7.370 06/14/2023 1050   PCO2ART 47.4 06/14/2023 1050   PO2ART 381 (H) 06/14/2023 1050   HCO3 27.4 06/14/2023 1050   TCO2 29 06/14/2023 1050   O2SAT 100 06/14/2023 1050     Coagulation Profile: Recent Labs  Lab 06/12/23 1200  INR 1.2    Cardiac Enzymes: No results for input(s): CKTOTAL, CKMB, CKMBINDEX, TROPONINI in the last 168 hours.  HbA1C: Hgb A1c MFr Bld  Date/Time Value Ref Range Status  06/12/2023 12:00 PM 6.2 (H) 4.8 - 5.6 % Final    Comment:    (NOTE)         Prediabetes: 5.7 - 6.4         Diabetes: >6.4         Glycemic control for adults with diabetes: <7.0   03/29/2018 10:23 AM 5.7 (H) 4.8 - 5.6 % Final    Comment:             Prediabetes: 5.7 - 6.4          Diabetes: >6.4          Glycemic control for adults with diabetes: <7.0     CBG: No results for input(s): GLUCAP in the last 168 hours.  Review of Systems:   Patient is intubated; therefore, history has been obtained from chart review.    Past Medical History:  She,  has a past medical history of Allergy, Atrial fibrillation (HCC), CAD (coronary artery disease) (08/2009), Cataract, CHF (congestive heart failure) (HCC), Cirrhosis (HCC), Dysrhythmia, Edema, peripheral, Heart murmur, Hypertension, Myocardial infarction [facility]), Nausea, Pneumonia, Sleep apnea, and Trichomoniasis (06/05/2018).   Surgical History:   Past Surgical History:  Procedure Laterality Date   BIOPSY  08/06/2020   Procedure: BIOPSY;  Surgeon: Teressa Toribio SQUIBB, MD;  Location: [facility];  Service: Gastroenterology;;   CATARACT EXTRACTION W/ INTRAOCULAR LENS IMPLANT  Bilateral    CESAREAN SECTION     x 1. no BTL   COLONOSCOPY WITH PROPOFOL  N/A 08/06/2020   Procedure: COLONOSCOPY WITH PROPOFOL ;  Surgeon: Teressa Toribio SQUIBB, MD;  Location: [facility];  Service: Gastroenterology;  Laterality: N/A;   CORONARY ANGIOPLASTY WITH STENT PLACEMENT  06/2010   DILATION AND CURETTAGE OF UTERUS  1982   For SAB   ESOPHAGOGASTRODUODENOSCOPY (EGD) WITH PROPOFOL  N/A 08/06/2020   Procedure: ESOPHAGOGASTRODUODENOSCOPY (EGD) WITH PROPOFOL ;  Surgeon: Teressa Toribio SQUIBB, MD;  Location: [facility];  Service: Gastroenterology;  Laterality: N/A;   HYSTEROSCOPY WITH D & C N/A 03/13/2019   Procedure: DILATATION AND CURETTAGE /HYSTEROSCOPY;  Surgeon: Izell Harari, MD;  Location: [facility];  Service: Gynecology;  Laterality: N/A;   POLYPECTOMY  08/06/2020   Procedure: POLYPECTOMY;  Surgeon: Teressa Toribio SQUIBB, MD;  Location: [facility];  Service: Gastroenterology;;   RIGHT/LEFT HEART CATH AND CORONARY ANGIOGRAPHY Bilateral 03/31/2023   Procedure: RIGHT/LEFT HEART CATH AND CORONARY ANGIOGRAPHY;  Surgeon: Mady Bruckner, MD;  Location: [facility] INVASIVE CV LAB;  Service: Cardiovascular;  Laterality: Bilateral;   TEE WITHOUT CARDIOVERSION N/A 03/08/2023   Procedure: TRANSESOPHAGEAL ECHOCARDIOGRAM;  Surgeon: Loni Soyla LABOR, MD;  Location: [facility] INVASIVE CV LAB;  Service: Cardiovascular;  Laterality: N/A;     Social History:   reports that she has quit smoking. Her smoking use included cigarettes. She has never used smokeless tobacco. She reports that she does not currently use alcohol. She reports current drug use. Drug: Marijuana.   Family History:  Her family history includes Breast cancer (age  of onset: 65) in her paternal aunt; CAD in her maternal grandmother and mother; Colon cancer in her father; Diabetes Mellitus II in her father; Hypertension in her sister; Kidney disease in her son.   Allergies Allergies  Allergen Reactions   Ace Inhibitors Cough    Prednisone  Itching and Swelling    Itching on leg and arms swelling from knee to ankles and felt restless     Home Medications  Prior to Admission medications   Medication Sig Start Date End Date Taking? Authorizing Provider  albuterol  (PROVENTIL  HFA;VENTOLIN  HFA) 108 (90 Base) MCG/ACT inhaler Inhale 2 puffs into the lungs every 6 (six) hours as needed for wheezing or shortness of breath. 06/24/18  Yes Konidena, Snehalatha, MD  apixaban  (ELIQUIS ) 5 MG TABS tablet Take 1 tablet (5 mg total) by mouth 2 (two) times daily. 04/10/23  Yes Acharya, Gayatri A, MD  atorvastatin  (LIPITOR) 20 MG tablet Take 1 tablet (20 mg total) by mouth every evening. 02/15/23  Yes Acharya, Gayatri A, MD  digoxin  (LANOXIN ) 0.125 MG tablet Take 0.5 tablets (0.0625 mg total) by mouth daily. 02/15/23  Yes Acharya, Gayatri A, MD  diltiazem  (CARDIZEM  CD) 360 MG 24 hr capsule TAKE 1 CAPSULE BY MOUTH EVERY DAY 11/29/22  Yes Acharya, Gayatri A, MD  furosemide  (LASIX ) 40 MG tablet Take 1 tablet (40 mg total) by mouth daily. 03/01/23  Yes Nandigam, Kavitha V, MD  Homeopathic Products [facility] MENTAL FOCUS PO) Take 2 capsules by mouth in the morning. Amare MentaFocus   Yes [provider]  metoprolol  tartrate (LOPRESSOR ) 100 MG tablet Take 1 tablet (100 mg total) by mouth 2 (two) times daily. 03/10/22  Yes Acharya, Gayatri A, MD  NON FORMULARY Take 2 capsules by mouth in the morning. Mood+ by [place] CAIN   Yes [provider]  OVER THE COUNTER MEDICATION Take 1 packet by mouth daily. Amare Edge+ Grape Plant-Based Nootropics   Yes [provider]  pantoprazole  (PROTONIX ) 40 MG tablet Take 1 tablet (40 mg total) by mouth daily. Patient taking differently: Take 40 mg by mouth every evening. 03/06/23  Yes Acharya, Gayatri A, MD  Probiotic Product (PROBIOTIC PO) Take 1 packet by mouth daily. MentaBiotics Advance Gut Brain Nutrition Prebiotic/Probiotic/Phytobiotics   Yes [provider]  spironolactone   (ALDACTONE ) 100 MG tablet Take 1 tablet (100 mg total) by mouth daily. 03/01/23  Yes Nandigam, Kavitha V, MD  acetaminophen  (TYLENOL ) 500 MG tablet Take 500 mg by mouth every 6 (six) hours as needed.    [provider]     Critical care time: 50 min.   Leita SHAUNNA Gaskins, DO 06/14/23 1:35 PM [facility]  For contact information, see Amion. If no response to pager, please call PCCM consult pager. After hours, 7PM- 7AM, please call Elink.

## 2023-06-14 NOTE — Hospital Course (Addendum)
    History of Present Illness:    At the time of CT surgical evaluation Pt is a very pleasant 68 yo female who has been found to have severe MR and TR. Pt with afib over the past 10 yrs not converted with two cardioversions now treated with rate control and anticoagulation. Pt has been having PND and now sleeps on several pillows. She also has had increasing fatigue and lower ext edema. TTE earlier this year with slightly depressed LV function and torrential MR and severe TR. Pt with severely dilated LA. 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 She underwent cath with no significant PHTN and moderate OM CAD. She was felt to need surgical intervention. Of note, she has alcoholic cirrhosis MELD-NA of 11 and followed by GI. She has mild verices but no history of GI bleed. She reports no alcohol except for rare drink now and then.  She was admitted this hospitalization for mitral valve and tricuspid valve repair.  Hospital course: The patient was admitted electively and taken to the operating room on 06/14/2023 at which time she underwent mitral valve repair using a 32 mm semiformed ring annuloplasty and tricuspid valve repair using a 32 mm Edwards MC 3 tricuspid annuloplasty ring.  Additionally she did undergo clipping of the left atrial appendage.  She tolerated the procedure well was taken to the surgical intensive care unit in stable condition.  Postoperative hospital course:  Patient remained intubated overnight and initially required pressor and inotropic support.  The PCCM team was consulted to assist with ICU management including ventilator.  On postoperative day #2 hemodynamics remained stable and inotropes were continued to be weaned.  She was started on Coumadin .  This was managed by pharmacy.  She continued a course of diuretics and pulmonary continue to manage her while in the ICU. She was aggressively diuresed to assist weaning oxygen. She was  started on pulmonary toilet. She has a history of sleep apnea requiring CPAP but has not used it in years. She required BIPAP at night.  Been treated aggressively with nebulizers.  She developed atrial fibrillation with reasonably controlled rates, home digoxin  was restarted. She was felt stable for transfer to the progressive unit.  She continued to make good progress overall and her oxygen need significantly decreased but she still will require oxygen short-term at home.  Arrangements are made.  Incisions are healing well without evidence of infection.  Coumadin  has been difficult to dose with INR varying from 6.0 to most recent 2.5 with low dosing.  Does have a history of alcoholic cirrhosis.  Dosing at time of discharge will be 0.5 mg daily until INR is checked in the Coumadin  clinic and adjusted.  She is making adequate progress in her cardiac rehab modalities and feeling stronger daily.  Her chronic atrial fibrillation is rate controlled.  He has an expected acute blood loss anemia this is stabilized and is showing an improving trend with equilibration of volume status.  He has been diuresed appropriately post cardiac surgical fluid retention which is not clinically significant.  She does not appear to require diuretics at time of discharge.  Renal function has remained within normal limits after a slight bump in creatinine to 1.16 as peak and currently 0.59.  Overall, at the time of discharge the patient is felt to be quite stable.

## 2023-06-15 ENCOUNTER — Encounter (HOSPITAL_COMMUNITY): Payer: Self-pay | Admitting: Thoracic Surgery (Cardiothoracic Vascular Surgery)

## 2023-06-15 ENCOUNTER — Inpatient Hospital Stay (HOSPITAL_COMMUNITY): Payer: Medicare Other

## 2023-06-15 DIAGNOSIS — J411 Mucopurulent chronic bronchitis: Secondary | ICD-10-CM | POA: Diagnosis not present

## 2023-06-15 DIAGNOSIS — Z9889 Other specified postprocedural states: Secondary | ICD-10-CM | POA: Diagnosis not present

## 2023-06-15 DIAGNOSIS — J9601 Acute respiratory failure with hypoxia: Secondary | ICD-10-CM

## 2023-06-15 DIAGNOSIS — R570 Cardiogenic shock: Secondary | ICD-10-CM | POA: Diagnosis not present

## 2023-06-15 LAB — BASIC METABOLIC PANEL
Anion gap: 10 (ref 5–15)
Anion gap: 10 (ref 5–15)
BUN: 21 mg/dL (ref 8–23)
BUN: 19 mg/dL (ref 8–23)
CO2: 22 mmol/L (ref 22–32)
CO2: 23 mmol/L (ref 22–32)
Calcium: 8.3 mg/dL — ABNORMAL LOW (ref 8.9–10.3)
Calcium: 8.3 mg/dL — ABNORMAL LOW (ref 8.9–10.3)
Chloride: 104 mmol/L (ref 98–111)
Chloride: 104 mmol/L (ref 98–111)
Creatinine, Ser: 1.11 mg/dL — ABNORMAL HIGH (ref 0.44–1.00)
Creatinine, Ser: 1.28 mg/dL — ABNORMAL HIGH (ref 0.44–1.00)
GFR, Estimated: 55 mL/min — ABNORMAL LOW (ref >60)
GFR, Estimated: 46 mL/min — ABNORMAL LOW (ref >60)
Glucose, Bld: 153 mg/dL — ABNORMAL HIGH (ref 70–99)
Glucose, Bld: 172 mg/dL — ABNORMAL HIGH (ref 70–99)
Potassium: 4.1 mmol/L (ref 3.5–5.1)
Potassium: 4.2 mmol/L (ref 3.5–5.1)
Sodium: 136 mmol/L (ref 135–145)
Sodium: 137 mmol/L (ref 135–145)

## 2023-06-15 LAB — GLUCOSE, CAPILLARY
Glucose-Capillary: 131 mg/dL — ABNORMAL HIGH (ref 70–99)
Glucose-Capillary: 138 mg/dL — ABNORMAL HIGH (ref 70–99)
Glucose-Capillary: 142 mg/dL — ABNORMAL HIGH (ref 70–99)
Glucose-Capillary: 142 mg/dL — ABNORMAL HIGH (ref 70–99)
Glucose-Capillary: 147 mg/dL — ABNORMAL HIGH (ref 70–99)
Glucose-Capillary: 149 mg/dL — ABNORMAL HIGH (ref 70–99)
Glucose-Capillary: 153 mg/dL — ABNORMAL HIGH (ref 70–99)
Glucose-Capillary: 154 mg/dL — ABNORMAL HIGH (ref 70–99)
Glucose-Capillary: 154 mg/dL — ABNORMAL HIGH (ref 70–99)
Glucose-Capillary: 155 mg/dL — ABNORMAL HIGH (ref 70–99)
Glucose-Capillary: 159 mg/dL — ABNORMAL HIGH (ref 70–99)
Glucose-Capillary: 170 mg/dL — ABNORMAL HIGH (ref 70–99)
Glucose-Capillary: 171 mg/dL — ABNORMAL HIGH (ref 70–99)
Glucose-Capillary: 177 mg/dL — ABNORMAL HIGH (ref 70–99)
Glucose-Capillary: 181 mg/dL — ABNORMAL HIGH (ref 70–99)

## 2023-06-15 LAB — POCT I-STAT 7, (LYTES, BLD GAS, ICA,H+H)
Acid-base deficit: 3 mmol/L — ABNORMAL HIGH (ref 0.0–2.0)
Acid-base deficit: 1 mmol/L (ref 0.0–2.0)
Acid-base deficit: 3 mmol/L — ABNORMAL HIGH (ref 0.0–2.0)
Acid-base deficit: 1 mmol/L (ref 0.0–2.0)
Acid-base deficit: 2 mmol/L (ref 0.0–2.0)
Bicarbonate: 24.7 mmol/L (ref 20.0–28.0)
Bicarbonate: 23.1 mmol/L (ref 20.0–28.0)
Bicarbonate: 24.1 mmol/L (ref 20.0–28.0)
Bicarbonate: 26.5 mmol/L (ref 20.0–28.0)
Bicarbonate: 25.1 mmol/L (ref 20.0–28.0)
Calcium, Ion: 1.23 mmol/L (ref 1.15–1.40)
Calcium, Ion: 1.14 mmol/L — ABNORMAL LOW (ref 1.15–1.40)
Calcium, Ion: 1.24 mmol/L (ref 1.15–1.40)
Calcium, Ion: 1.22 mmol/L (ref 1.15–1.40)
Calcium, Ion: 1.27 mmol/L (ref 1.15–1.40)
HCT: 33 % — ABNORMAL LOW (ref 36.0–46.0)
HCT: 31 % — ABNORMAL LOW (ref 36.0–46.0)
HCT: 31 % — ABNORMAL LOW (ref 36.0–46.0)
HCT: 31 % — ABNORMAL LOW (ref 36.0–46.0)
HCT: 29 % — ABNORMAL LOW (ref 36.0–46.0)
Hemoglobin: 11.2 g/dL — ABNORMAL LOW (ref 12.0–15.0)
Hemoglobin: 10.5 g/dL — ABNORMAL LOW (ref 12.0–15.0)
Hemoglobin: 10.5 g/dL — ABNORMAL LOW (ref 12.0–15.0)
Hemoglobin: 10.5 g/dL — ABNORMAL LOW (ref 12.0–15.0)
Hemoglobin: 9.9 g/dL — ABNORMAL LOW (ref 12.0–15.0)
O2 Saturation: 93 %
O2 Saturation: 94 %
O2 Saturation: 93 %
O2 Saturation: 95 %
O2 Saturation: 93 %
Patient temperature: 37.3
Patient temperature: 37.5
Patient temperature: 37.1
Patient temperature: 37.1
Patient temperature: 37
Potassium: 3.9 mmol/L (ref 3.5–5.1)
Potassium: 3.7 mmol/L (ref 3.5–5.1)
Potassium: 4.2 mmol/L (ref 3.5–5.1)
Potassium: 4.4 mmol/L (ref 3.5–5.1)
Potassium: 4.3 mmol/L (ref 3.5–5.1)
Sodium: 138 mmol/L (ref 135–145)
Sodium: 137 mmol/L (ref 135–145)
Sodium: 138 mmol/L (ref 135–145)
Sodium: 138 mmol/L (ref 135–145)
Sodium: 138 mmol/L (ref 135–145)
TCO2: 26 mmol/L (ref 22–32)
TCO2: 24 mmol/L (ref 22–32)
TCO2: 26 mmol/L (ref 22–32)
TCO2: 28 mmol/L (ref 22–32)
TCO2: 27 mmol/L (ref 22–32)
pCO2 arterial: 58.4 mm[Hg] — ABNORMAL HIGH (ref 32–48)
pCO2 arterial: 34.7 mm[Hg] (ref 32–48)
pCO2 arterial: 54 mm[Hg] — ABNORMAL HIGH (ref 32–48)
pCO2 arterial: 56.2 mm[Hg] — ABNORMAL HIGH (ref 32–48)
pCO2 arterial: 54.7 mm[Hg] — ABNORMAL HIGH (ref 32–48)
pH, Arterial: 7.237 — ABNORMAL LOW (ref 7.35–7.45)
pH, Arterial: 7.433 (ref 7.35–7.45)
pH, Arterial: 7.259 — ABNORMAL LOW (ref 7.35–7.45)
pH, Arterial: 7.281 — ABNORMAL LOW (ref 7.35–7.45)
pH, Arterial: 7.269 — ABNORMAL LOW (ref 7.35–7.45)
pO2, Arterial: 80 mm[Hg] — ABNORMAL LOW (ref 83–108)
pO2, Arterial: 68 mm[Hg] — ABNORMAL LOW (ref 83–108)
pO2, Arterial: 79 mm[Hg] — ABNORMAL LOW (ref 83–108)
pO2, Arterial: 88 mm[Hg] (ref 83–108)
pO2, Arterial: 77 mm[Hg] — ABNORMAL LOW (ref 83–108)

## 2023-06-15 LAB — CBC
HCT: 34.3 % — ABNORMAL LOW (ref 36.0–46.0)
HCT: 32.6 % — ABNORMAL LOW (ref 36.0–46.0)
Hemoglobin: 10.9 g/dL — ABNORMAL LOW (ref 12.0–15.0)
Hemoglobin: 10.3 g/dL — ABNORMAL LOW (ref 12.0–15.0)
MCH: 30.4 pg (ref 26.0–34.0)
MCH: 30.4 pg (ref 26.0–34.0)
MCHC: 31.8 g/dL (ref 30.0–36.0)
MCHC: 31.6 g/dL (ref 30.0–36.0)
MCV: 95.5 fL (ref 80.0–100.0)
MCV: 96.2 fL (ref 80.0–100.0)
Platelets: 143 10*3/uL — ABNORMAL LOW (ref 150–400)
Platelets: 113 10*3/uL — ABNORMAL LOW (ref 150–400)
RBC: 3.59 MIL/uL — ABNORMAL LOW (ref 3.87–5.11)
RBC: 3.39 MIL/uL — ABNORMAL LOW (ref 3.87–5.11)
RDW: 14.6 % (ref 11.5–15.5)
RDW: 14.7 % (ref 11.5–15.5)
WBC: 23.6 10*3/uL — ABNORMAL HIGH (ref 4.0–10.5)
WBC: 21 10*3/uL — ABNORMAL HIGH (ref 4.0–10.5)
nRBC: 0 % (ref 0.0–0.2)
nRBC: 0 % (ref 0.0–0.2)

## 2023-06-15 LAB — AMMONIA: Ammonia: 61 umol/L — ABNORMAL HIGH (ref 9–35)

## 2023-06-15 LAB — MAGNESIUM
Magnesium: 2.5 mg/dL — ABNORMAL HIGH (ref 1.7–2.4)
Magnesium: 2.1 mg/dL (ref 1.7–2.4)

## 2023-06-15 MED ORDER — DEXMEDETOMIDINE HCL IN NACL 400 MCG/100ML IV SOLN
0.0000 ug/kg/h | INTRAVENOUS | Status: DC
  Administered 2023-06-15: 0.4 ug/kg/h via INTRAVENOUS
  Filled 2023-06-15: qty 100

## 2023-06-15 MED ORDER — ATORVASTATIN CALCIUM 10 MG PO TABS
20.0000 mg | ORAL_TABLET | Freq: Every day | ORAL | Status: DC
  Administered 2023-06-15 – 2023-06-19 (×5): 20 mg
  Filled 2023-06-15 (×5): qty 2

## 2023-06-15 MED ORDER — FUROSEMIDE 10 MG/ML IJ SOLN
40.0000 mg | Freq: Two times a day (BID) | INTRAMUSCULAR | Status: AC
Start: 2023-06-15 — End: 2023-06-15
  Administered 2023-06-15 (×2): 40 mg via INTRAVENOUS
  Filled 2023-06-15 (×2): qty 4

## 2023-06-15 MED ORDER — FUROSEMIDE 10 MG/ML IJ SOLN: 40.0000 mg | Freq: Once | INTRAMUSCULAR | Status: DC

## 2023-06-15 MED ORDER — ORAL CARE MOUTH RINSE: 15.0000 mL | OROMUCOSAL | Status: DC | PRN

## 2023-06-15 MED ORDER — POTASSIUM CHLORIDE 20 MEQ PO PACK
40.0000 meq | PACK | Freq: Two times a day (BID) | ORAL | Status: DC
  Administered 2023-06-15: 40 meq
  Filled 2023-06-15 (×2): qty 2

## 2023-06-15 MED ORDER — INSULIN ASPART 100 UNIT/ML IJ SOLN
0.0000 [IU] | INTRAMUSCULAR | Status: DC
  Administered 2023-06-15 – 2023-06-18 (×7): 2 [IU] via SUBCUTANEOUS

## 2023-06-15 MED ORDER — ORAL CARE MOUTH RINSE
15.0000 mL | OROMUCOSAL | Status: DC
  Administered 2023-06-15 – 2023-06-21 (×24): 15 mL via OROMUCOSAL

## 2023-06-15 MED ORDER — HYDROXYZINE HCL 25 MG PO TABS
25.0000 mg | ORAL_TABLET | Freq: Three times a day (TID) | ORAL | Status: DC | PRN
  Administered 2023-06-15 – 2023-06-20 (×2): 25 mg via ORAL
  Filled 2023-06-15 (×2): qty 1

## 2023-06-15 MED FILL — Sodium Bicarbonate IV Soln 8.4%: INTRAVENOUS | Qty: 50 | Status: AC

## 2023-06-15 MED FILL — Heparin Sodium (Porcine) Inj 1000 Unit/ML: INTRAMUSCULAR | Qty: 10 | Status: AC

## 2023-06-15 MED FILL — Sodium Chloride IV Soln 0.9%: INTRAVENOUS | Qty: 4000 | Status: AC

## 2023-06-15 MED FILL — Electrolyte-R (PH 7.4) Solution: INTRAVENOUS | Qty: 4000 | Status: AC

## 2023-06-15 NOTE — Plan of Care (Signed)
  Problem: Cardiac: Goal: Will achieve and/or maintain hemodynamic stability Outcome: Progressing   Problem: Urinary Elimination: Goal: Ability to achieve and maintain adequate renal perfusion and functioning will improve Outcome: Progressing   Problem: Skin Integrity: Goal: Wound healing without signs and symptoms of infection Outcome: Progressing

## 2023-06-15 NOTE — Progress Notes (Signed)
 ABG reviewed  BiPAP RR increased to 26, PS increased to 12 over CPAP 5  Suspect this is related to low respiratory drive for some reason. Check ammonia Minimize sedation as able  Rhonda SHAUNNA Gaskins, DO 06/15/23 5:43 PM [facility]  For contact information, see Amion. If no response to pager, please call PCCM consult pager. After hours, 7PM- 7AM, please call [facility].

## 2023-06-15 NOTE — Progress Notes (Signed)
 [facility] DAILY ICU PROGRESS NOTE                   [address]            Rhonda [name] [address]          [phone]   1 Day Post-Op Procedure(s) (LRB): MITRAL VALVE REPAIR USING 32 MM SMUFORM SEMI-RIGID ANNULOPLASTY RING (N/A) TRICUSPID VALVE REPAIR USING 32 MM EDWARDS MC3 TRICUSPID ANNULOPLASTY RING (N/A) CLIPPING OF ATRIAL APPENDAGE USING 50 MM MEDTRONIC PENDITURE LAA CLIP (N/A) TRANSESOPHAGEAL ECHOCARDIOGRAM (N/A)  Total Length of Stay:  LOS: 1 day   Subjective: Alert on vent   Objective: Vital signs in last 24 hours: Temp:  [97.2 F (36.2 C)-99.3 F (37.4 C)] 99.3 F (37.4 C) (01/09 0725) Pulse Rate:  [78-90] 90 (01/09 0725) Cardiac Rhythm: Ventricular paced (01/08 2000) Resp:  [0-27] 25 (01/09 0725) BP: (80-105)/(46-65) 98/53 (01/09 0700) SpO2:  [90 %-98 %] 96 % (01/09 0725) Arterial Line BP: (59-118)/(43-63) 107/51 (01/09 0700) FiO2 (%):  [40 %-50 %] 40 % (01/09 0728) Weight:  [85 kg] 85 kg (01/09 0530)  Filed Weights   06/14/23 0703 06/15/23 0530  Weight: 77.1 kg 85 kg    Weight change:    Hemodynamic parameters for last 24 hours: PAP: (17)/(14) 17/14 CVP:  [16 mmHg-25 mmHg] 16 mmHg  Intake/Output from previous day: 01/08 0701 - 01/09 0700 In: 6089.7 [I.V.:3459.6; Blood:602; NG/GT:90; IV Piggyback:1938.1] Out: 2385 [Urine:930; Emesis/NG output:50; Blood:1080; Chest Tube:325]  Intake/Output this shift: No intake/output data recorded.  Current Meds: Scheduled Meds:  acetaminophen   1,000 mg Oral Q6H   Or   acetaminophen  (TYLENOL ) oral liquid 160 mg/5 mL  1,000 mg Per Tube Q6H   arformoterol   15 mcg Nebulization BID   aspirin  EC  325 mg Oral Daily   Or   aspirin   324 mg Per Tube Daily   bisacodyl   10 mg Oral Daily   Or   bisacodyl   10 mg Rectal Daily   Chlorhexidine  Gluconate Cloth  6 each Topical Daily   dextromethorphan -guaiFENesin   1 tablet Oral BID   docusate sodium   200 mg Oral Daily   folic acid   1 mg Oral Daily    metoCLOPramide  (REGLAN ) injection  10 mg Intravenous Q6H   metoprolol  tartrate  12.5 mg Oral BID   Or   metoprolol  tartrate  12.5 mg Per Tube BID   multivitamins with iron   1 tablet Oral Daily   mouth rinse  15 mL Mouth Rinse Q2H   [START ON 06/16/2023] pantoprazole   40 mg Oral Daily   pantoprazole  (PROTONIX ) IV  40 mg Intravenous QHS   revefenacin   175 mcg Nebulization Daily   sodium chloride  flush  3 mL Intravenous Q12H   sodium chloride  HYPERTONIC  4 mL Nebulization BID   thiamine   100 mg Oral Daily   Continuous Infusions:  sodium chloride  Stopped (06/14/23 1424)   sodium chloride      sodium chloride      albumin  human Stopped (06/15/23 0301)    ceFAZolin  (ANCEF ) IV Stopped (06/15/23 0546)   dexmedetomidine  (PRECEDEX ) IV infusion Stopped (06/14/23 1700)   epinephrine  10 mcg/min (06/15/23 0700)   insulin  3 Units/hr (06/15/23 0700)   lactated ringers      lactated ringers  20 mL/hr at 06/15/23 0700   norepinephrine  (LEVOPHED ) Adult infusion 4 mcg/min (06/15/23 0700)   vasopressin  0.03 Units/min (06/15/23 0719)   PRN Meds:.sodium chloride , albumin  human, dextrose , ipratropium-albuterol , metoprolol  tartrate, midazolam , morphine  injection, ondansetron  (ZOFRAN ) IV, mouth  rinse, oxyCODONE , sodium chloride  flush, traMADol   General appearance: alert, cooperative, and no distress Heart: regular rate and rhythm and paced Lungs: clear anteriorly Abdomen: benign Extremities: + Periph edema Wound: dressings CDI  Lab Results: CBC: Recent Labs    06/14/23 1809 06/14/23 1821 06/15/23 0358 06/15/23 0413  WBC 30.6*  --  23.6*  --   HGB 11.4*   < > 10.9* 11.2*  HCT 35.5*   < > 34.3* 33.0*  PLT 126*  --  143*  --    < > = values in this interval not displayed.   BMET:  Recent Labs    06/14/23 1809 06/14/23 1821 06/15/23 0358 06/15/23 0413  NA 136   < > 136 138  K 3.9   < > 4.1 3.9  CL 104  --  104  --   CO2 24  --  22  --   GLUCOSE 134*  --  153*  --   BUN 21  --  21  --    CREATININE 1.06*  --  1.11*  --   CALCIUM  7.9*  --  8.3*  --    < > = values in this interval not displayed.    CMET: Lab Results  Component Value Date   WBC 23.6 (H) 06/15/2023   HGB 11.2 (L) 06/15/2023   HCT 33.0 (L) 06/15/2023   PLT 143 (L) 06/15/2023   GLUCOSE 153 (H) 06/15/2023   CHOL 142 11/28/2022   TRIG 70 11/28/2022   HDL 47 11/28/2022   LDLCALC 81 11/28/2022   ALT 13 06/12/2023   AST 19 06/12/2023   NA 138 06/15/2023   K 3.9 06/15/2023   CL 104 06/15/2023   CREATININE 1.11 (H) 06/15/2023   BUN 21 06/15/2023   CO2 22 06/15/2023   INR 1.8 (H) 06/14/2023   HGBA1C 6.2 (H) 06/12/2023      PT/INR:  Recent Labs    06/14/23 1304  LABPROT 20.9*  INR 1.8*   Radiology: ECHO INTRAOPERATIVE TEE Result Date: 06/14/2023  *INTRAOPERATIVE TRANSESOPHAGEAL REPORT *  Patient Name:   Rhonda [name] Date of Exam: 06/14/2023 Medical Rec #:  [id number]              Height:       62.0 in Accession #:    [id number]             Weight:       170.0 lb Date of Birth:  1956-02-05              BSA:          1.78 m Patient Age:    67 years               BP:           115/78 mmHg Patient Gender: F                      HR:           86 bpm. Exam Location:  Anesthesiology Transesophogeal exam was perform intraoperatively during surgical procedure. Patient was closely monitored under general anesthesia during the entirety of examination. Indications:     Mitral Regurgitation Sonographer: Performing Phys: Thom Peoples Diagnosing Phys: Thom Peoples Complications: No known complications during this procedure. POST-OP IMPRESSIONS _ Left Ventricle: The left ventricular function is mildly reduced on inotropic support. _ Right Ventricle: The right ventricular function moderately reduced on inotropic support. FAC 23.6% _ Aorta: The  aorta appears unchanged from pre-bypass. There is no dissection. _ Left Atrium: The left atrium appears unchanged from pre-bypass. _ Left Atrial Appendage: There is no residual LAA  following surgical removal. _ Aortic Valve: The aortic valve appears unchanged from pre-bypass. Trace AI. _ Mitral Valve: There is an annuloplasty ring in the mitral position. There is mild residual MR following repair. Mean PG . _ Tricuspid Valve: There is an annulopasty ring in the tricuspid position. There is trace TR following repair. _ Pulmonic Valve: The pulmonic valve appears unchanged from pre-bypass. Trace Pulmonic insufficiency.  PRE-OP FINDINGS  Left Ventricle: The left ventricle has low normal systolic function, with an ejection fraction of 50-55%. The cavity size was normal. Left ventricular diastolic parameters are consistent with Grade III diastolic dysfunction (restrictive). Right Ventricle: The right ventricle has borderline reduced systolic function. FAC 34.1%. The cavity was dialated with tricuspid annulus measured at 4.36cm. There is no increase in right ventricular wall thickness. Left Atrium: Left atrial size was severely dilated. No left atrial/left atrial appendage thrombus was detected.  nteratrial Septum: No atrial level shunt detected by color flow Doppler. There is no evidence of a patent foramen ovale. Pericardium: The pericardium was not assessed. There is pleural effusion in the right lateral region. Mitral Valve: The posterior mitral leaflet is shortened but moves appropriately. The anterior mitral leaflet appears thickened at the dital most tip. There is a coaptation defect between the leaflets with associated severe mitral regurgitation. Etiology appears secondary to atrial / annulus dilation (Carpentier type Ia). The regurgitant jet is posteriorly directed. VC 0.8cm. 3D EROA 0.56cm^2 Tricuspid Valve: The tricuspid valve was normal in structure. Tricuspid valve regurgitation is moderate to severe by color flow Doppler. There appears to be systolic flow reversal in the hepatic veins. Aortic Valve: The aortic valve is normal in structure. Aortic valve regurgitation is trivial by  color flow Doppler. There is no stenosis of the aortic valve, with a calculated valve area of 1.53 cm. Pulmonic Valve: The pulmonic valve was normal in structure. Pulmonic valve regurgitation is trivial by color flow Doppler. Aorta: The aortic root and ascending aorta are normal in size and structure. There is evidence of plaque in the aortic arch and descending aorta. There is no dissection. Pulmonary Artery: The pulmonary artery is moderately dilated. +--------------+--------++ LEFT VENTRICLE          +----------------+----------++ +--------------+--------++  Diastology                 PLAX 2D                 +----------------+----------++ +--------------+--------++  LV e' lateral:  12.50 cm/s LVOT diam:    1.90 cm   +----------------+----------++ +--------------+--------++  LV E/e' lateral:12.8       LVOT Area:    2.84 cm  +----------------+----------++ +--------------+--------++                        +--------------+--------++ +---------------+------+-------+ RIGHT VENTRICLE              +---------------+------+-------+ RV FAC:        28.1 %        +---------------+------+-------+ TAPSE (M-mode):1.7 cm2.37 cm +---------------+------+-------+ +------------------+-----------++ AORTIC VALVE                  +------------------+-----------++ AV Area (Vmax):   1.70 cm    +------------------+-----------++ AV Area (Vmean):  1.68 cm    +------------------+-----------++ AV Area (VTI):    1.53 cm    +------------------+-----------++  AV Vmax:          109.50 cm/s +------------------+-----------++ AV Vmean:         65.800 cm/s +------------------+-----------++ AV VTI:           0.234 m     +------------------+-----------++ AV Peak Grad:     4.8 mmHg    +------------------+-----------++ AV Mean Grad:     2.0 mmHg    +------------------+-----------++ LVOT Vmax:        65.60 cm/s  +------------------+-----------++ LVOT Vmean:        38.900 cm/s +------------------+-----------++ LVOT VTI:         0.126 m     +------------------+-----------++ LVOT/AV VTI ratio:0.54        +------------------+-----------++  +--------------+-------++ AORTA                 +--------------+-------++ Ao Sinus diam:2.90 cm +--------------+-------++ Ao STJ diam:  2.6 cm  +--------------+-------++ Ao Asc diam:  3.20 cm +--------------+-------++ +--------------+----------++     +---------------+-----------++ MITRAL VALVE                 TRICUSPID VALVE            +--------------+----------++     +---------------+-----------++ MV Area (PHT):3.79 cm       TR Peak grad:  24.4 mmHg   +--------------+----------++     +---------------+-----------++ MV Peak grad: 10.6 mmHg      TR Vmax:       247.00 cm/s +--------------+----------++     +---------------+-----------++ MV Mean grad: 3.0 mmHg   +--------------+----------++     +--------------+-------+ MV Vmax:      1.63 m/s       SHUNTS                +--------------+----------++     +--------------+-------+ MV Vmean:     77.2 cm/s      Systemic VTI: 0.13 m  +--------------+----------++     +--------------+-------+ MV VTI:       0.27 m         Systemic Diam:1.90 cm +--------------+----------++     +--------------+-------+ MV PHT:       58.00 msec +--------------+----------++ MV Decel Time:200 msec   +--------------+----------++ +----------------+-----------++ MR Peak grad:   77.1 mmHg   +----------------+-----------++ MR Mean grad:   51.0 mmHg   +----------------+-----------++ MR Vmax:        439.00 cm/s +----------------+-----------++ MR Vmean:       334.0 cm/s  +----------------+-----------++ MR PISA:        9.05 cm    +----------------+-----------++ MR PISA Eff ROA:90 mm      +----------------+-----------++ MR PISA Radius: 1.20 cm     +----------------+-----------++ +--------------+-----------++ MV E  velocity:160.00 cm/s +--------------+-----------++ MV A velocity:48.00 cm/s  +--------------+-----------++ MV E/A ratio: 3.33        +--------------+-----------++  Thom Peoples Electronically signed by Thom Peoples Signature Date/Time: 06/14/2023/2:57:36 PM    Final    DG Chest Port 1 View Result Date: 06/14/2023 CLINICAL DATA:  Status post mitral and tricuspid valve repair EXAM: PORTABLE CHEST 1 VIEW COMPARISON:  06/12/2023 FINDINGS: Gross cardiomegaly status post interval median sternotomy with mitral and tricuspid annular prosthesis and left atrial appendage clip. Support apparatus includes endotracheal tube, esophagogastric tube, right neck vascular catheter and sheath, and mediastinal drainage tubes. Diffuse bilateral interstitial pulmonary opacity and small layering pleural effusions, consistent with edema. No focal airspace opacity. IMPRESSION: 1. Gross cardiomegaly status post interval median sternotomy with mitral and tricuspid annular prosthesis and left atrial appendage  clip. 2. Diffuse bilateral interstitial pulmonary opacity and small layering pleural effusions, consistent with edema. No focal airspace opacity. 3. Support apparatus as above. Electronically Signed   By: Marolyn JONETTA Jaksch M.D.   On: 06/14/2023 14:22   EP STUDY Result Date: 06/14/2023 See surgical note for result.    Assessment/Plan: S/P Procedure(s) (LRB): MITRAL VALVE REPAIR USING 32 MM SMUFORM SEMI-RIGID ANNULOPLASTY RING (N/A) TRICUSPID VALVE REPAIR USING 32 MM EDWARDS MC3 TRICUSPID ANNULOPLASTY RING (N/A) CLIPPING OF ATRIAL APPENDAGE USING 50 MM MEDTRONIC PENDITURE LAA CLIP (N/A) TRANSESOPHAGEAL ECHOCARDIOGRAM (N/A) POD#1 1 remains intubated, PCCM to do conventional wean this am/ ICU management 2 Tmax 99.3, Vpaced, HR in 60's underneath s BP 80's-100's, Swan unable to determine full hemodynamics, CVP 16, current gtts- epi/levo/vasopressin  3 fair UOP- weight up about 12 KG if accurate , will need diuresis 4 CT 325 ml-  keep in place till at least extubated 5 CXR - some basilar ASD/atx, may be some vascular fullness 6 ABG metabolic acidosis 7 creat up slightly to 1.11, BUN normal  8 reactive leukocytosis, trending lower, WBC 23.6 9 minor expected ABLA , H/H 11/33 10 minor thrombocytopenia, plt Ct 143K     Rhonda [name] Cera PA-C Pager [phone] 06/15/2023 7:32 AM

## 2023-06-15 NOTE — Progress Notes (Signed)
 PCCM Interval Progress Note  Attempted right radial arterial line given prior arterial line malfunctioning.  Vessel cannulated on 3 different attempts easily; however, on all 3 attempts, guidewire with resistance when trying to advance. Some calcifications noted in artery wall but did not expect to meet this resistance.  After 3rd attempt with same result, aborted.  Pt to be extubated then RT will attempt left radial.  If no success, will discuss with team regarding going off cuff pressures vs evaluating additional sites for arterial line placement.   Sammi Gore, PA - C [facility] For pager details, please see AMION or use Epic chat  After 1900, please call [facility] for cross coverage needs 06/15/2023, 10:54 AM

## 2023-06-15 NOTE — Progress Notes (Signed)
 eLink Physician-Brief Progress Note Patient Name: Rhonda [name] DOB: Oct 18, 1955 MRN: [id number]   Date of Service  06/15/2023  HPI/Events of Note  has scheduled potassium per tube 2x daily, Pt extubated today & on BiPAP, no tube & cannot take PO  eICU Interventions  Potassium is 4.3, hold potassium replacement for the time being.  Cannot schedule IV replacement-will reassess with a.m. labs.     Intervention Category Minor Interventions: Electrolytes abnormality - evaluation and management  [name] 06/15/2023, 9:55 PM

## 2023-06-15 NOTE — Procedures (Signed)
 Extubation Procedure Note  Patient Details:   Name: Rhonda [name] DOB: [birth year] MRN: [id number]   Airway Documentation:    Vent end date: 06/15/23 Vent end time: 1035   Evaluation  O2 sats: stable throughout Complications: No apparent complications Patient did tolerate procedure well. Bilateral Breath Sounds: Clear, Diminished   Yes  Patient extubated per order to bipap with no apparent complications. Positive cuff leak was noted prior to extubation., Patient is alert and oriented, has strong cough, and is able to speak. Vitals are stable. RT will continue to monitor.   Delno Blaisdell CHRISTELLA Anon 06/15/2023, 10:55 AM

## 2023-06-15 NOTE — Progress Notes (Signed)
 PCCM Brief Note   Has been on BiPAP PSV 10/5, rate 12. Breathing over 16 - 18. ABG this PM 7.25/54/79.  Changed settings to try PSV 12/5, rate 22. Breathing right at 22-24. Add low dose Precedex  for comfort as pt not too keen on higher pressures. Repeat ABG at 1700.   Sammi Gore, PA - C  [facility] For pager details, please see AMION or use Epic chat  After 1900, please call [facility] for cross coverage needs 06/15/2023, 3:23 PM

## 2023-06-15 NOTE — Progress Notes (Signed)
 eLink Physician-Brief Progress Note Patient Name: Rhonda [name] DOB: September 02, 1955 MRN: [id number]   Date of Service  06/15/2023  HPI/Events of Note  Persistent hypercapnea, acidemia Increased RR from 15->18 earlier in the night with minimal benefit. Patient breathing RR of 18, synchronous with vent  eICU Interventions  Increase RR to 24 Some of the worsening acidosis is metabolic also, currently okay without Bicarb      Intervention Category Intermediate Interventions: Respiratory distress - evaluation and management  Lancelot Alyea 06/15/2023, 4:39 AM

## 2023-06-15 NOTE — Progress Notes (Signed)
 NAME:  Rhonda [name], MRN:  [id number], DOB:  19-Feb-1956, LOS: 1 ADMISSION DATE:  06/14/2023, CONSULTATION DATE:  1/8 REFERRING MD:  1/8, CHIEF COMPLAINT:  s/p MVR and TVR  History of Present Illness:  Patient is a 68 yo F w/ pertinent PMH severe MR and TR, diastolic CHF, alcoholic cirrhosis w/ mild varices (meld score 11), afib on eliquis , HTN, CAD s/p RCA stent 2011, OSA presents to [facility] on 1/8 for elective MVR/TVR.  Followed by cardiology outpt. Patient having worsening dyspnea and edema. Echo with torrential MVR and severe TVR. Cath showing moderate OM. TCTS consulted and recommend surgery. On 1/8 patient came in for elective MVR/TVR. Post-op intubated/sedated. PCCM consulted for ICU care.  In pre-op she had a productive cough and had wheezing post intubation that responded to bronchodilators. Norva was not able to be advanced and is measuring a CVP currently. RV function mildly reduced post-op, started on epi. Transferred on epi, NE, vasopressin . Reversed paralytics in ICU.    EBL 1080, cell saver 602cc Bypass time 1:30 Cross clamp time 1:15   Pertinent  Medical History   Past Medical History:  Diagnosis Date   Allergy    Atrial fibrillation (HCC)    CAD (coronary artery disease) 08/2009   BMS to RCA, non-obstructive in CX and LAD 2011   Cataract    CHF (congestive heart failure) (HCC)    Cirrhosis (HCC)    Dysrhythmia    A. Fib   Edema, peripheral    Heart murmur    Hypertension    Myocardial infarction [facility])    Nausea    Pneumonia    January 2020   Sleep apnea    No CPAP   Trichomoniasis 06/05/2018     Significant Hospital Events: Including procedures, antibiotic start and stop dates in addition to other pertinent events   1/8 post op MVR/TVR; pccm consulted  1/9 failed wean due to hypercapnia and acidosis  Interim History / Subjective:  Awake on vent. Follows all commands. Weight up 17lbs. IO net +3.7L.  Objective   Blood pressure (!) 100/53, pulse  89, temperature 99.3 F (37.4 C), resp. rate (!) 25, height 5' 2 (1.575 m), weight 85 kg, SpO2 96%. PAP: (17)/(14) 17/14 CVP:  [15 mmHg-25 mmHg] 15 mmHg  Vent Mode: PRVC FiO2 (%):  [40 %-50 %] 40 % Set Rate:  [4 bmp-24 bmp] 24 bmp Vt Set:  [400 mL] 400 mL PEEP:  [5 cmH20] 5 cmH20 Pressure Support:  [10 cmH20] 10 cmH20 Plateau Pressure:  [13 cmH20-24 cmH20] 18 cmH20   Intake/Output Summary (Last 24 hours) at 06/15/2023 0826 Last data filed at 06/15/2023 0700 Gross per 24 hour  Intake 6089.71 ml  Output 2415 ml  Net 3674.71 ml   Filed Weights   06/14/23 0703 06/15/23 0530  Weight: 77.1 kg 85 kg   Examination: General: Adult female, resting in bed, awake on vent, in NAD. Neuro: Awake on vent, follows commands, nods head appropriately in response to questions. HEENT: [place]/AT. Sclerae anicteric. ETT in place. Cardiovascular: RRR, no M/R/G. V paced. Lungs: Respirations even and unlabored.  CTA bilaterally, No W/R/R. Abdomen: BS x 4, soft, NT/ND.  Musculoskeletal: No gross deformities, no edema.  Skin: Intact, warm, no rashes.  CXR personally reviewed>  mild pulmonary edema, RIJ introducer with swan retracted into catheter. ETT 2 cm above carina.   EKG: pending Post op labs pending   CT chest 1/6 personally reviewed> R dependent effusion, mild RML consolidation with air  bronchograms, peripheral nodule, possible mucus plugging in proximal segmental bronchi medially. LLL bronchiectasis.   No PFTs on file.    Assessment & Plan:   Severe MR, s/p MV repair, TR s/p tricuspid repair. -post-op care per TCTS -complete post op antibiotics -pain control per protocol- morphine , tramadol , oxycodone  -aspirin , statin daily -start metoprolol  once weaned off vasopressors -monitor chest tube output -pacing dependent currently; may need amio for rate control -Wean pressors & inotropes as able to maintain MAP >65; wean vaso & NE before weaning epi given RV stunning  Volume overload - 17lb  weight gain, net +3.7L since admit. - 40mg  Lasix  q12hrs x 2 today - Supplemental K with diuresis  H/o CAD, s/p DES 2011 H/o HFpEF HTN -hold PTA metoprolol , spiro, digoxin , dilt, metoprolol   Valvular Afib, chronic -hold AC -on diltiazem , metoprolol , and digoxin  PTA- anticipate difficulty with rate control  Post-op vent management Chronic R pleural effusion likely due to cirrhosis, POA COPD, bronchiectasis seen on CT with chronic cough H/o OSA - PRVC this AM and plan for traditional wean - repeat ABG in 1 hour and wean as able - VAP prevention protocol - pulmonary hygiene- added hypertonic saline BID & mucinex  to thin secretions. - reinforce sternal precautions with her chronic cough -brovana  & yupelri   Hyperglycemia; h/o prediabetes. A1c 6.2. - insulin  gtt per protocol  H/o tobacco abuse; quit 06/2023 RML nodule peripherally -recommend sticking with tobacco cessation -recommend follow up CT as OP  H/o GERD -con't PPI  Alcoholic cirrhosis w/ mild varices (meld score 11) -monitor volume status closely - avoid hepatically metabolized meds - Lasix  as above - Resume spiro for cirrhosis/ ascites control when able -monitor for encephalopathy -no baseline anemia or thrombocytopenia -vitamins, unsure when her last drink was   Best Practice (right click and Reselect all SmartList Selections daily)   Diet/type: NPO w/ meds via tube DVT prophylaxis: SCD GI prophylaxis: PPI Lines: Central line and Arterial Line Foley:  Yes, and it is still needed Code Status:  full code Last date of multidisciplinary goals of care discussion [per primary]  Critical care time: 35 min.    Sammi Gore, PA - C [place] [facility] For pager details, please see AMION or use Epic chat  After 1900, please call [facility] for cross coverage needs 06/15/2023, 8:43 AM

## 2023-06-15 NOTE — Procedures (Signed)
 Arterial Catheter Insertion Procedure Note  Bess Saltzman  [id number]  1956-03-07  Date:06/15/23  Time:12:14 PM    Provider Performing: Sammi Gore    Procedure: Insertion of Arterial Line (63379) with US  guidance (23062)   Indication(s) Blood pressure monitoring and/or need for frequent ABGs  Consent Risks of the procedure as well as the alternatives and risks of each were explained to the patient and/or caregiver.  Consent for the procedure was obtained and is signed in the bedside chart  Anesthesia None   Time Out Verified patient identification, verified procedure, site/side was marked, verified correct patient position, special equipment/implants available, medications/allergies/relevant history reviewed, required imaging and test results available.   Sterile Technique Maximal sterile technique including full sterile barrier drape, hand hygiene, sterile gown, sterile gloves, mask, hair covering, sterile ultrasound probe cover (if used).   Procedure Description Area of catheter insertion was cleaned with chlorhexidine  and draped in sterile fashion. With real-time ultrasound guidance an arterial catheter was placed into the right femoral artery.  Appropriate arterial tracings confirmed on monitor.     Complications/Tolerance None; patient tolerated the procedure well.   EBL Minimal   Specimen(s) None    Sammi Gore, PA - C [place] [facility] For pager details, please see AMION or use Epic chat  After 1900, please call [facility] for cross coverage needs 06/15/2023, 12:14 PM

## 2023-06-16 ENCOUNTER — Inpatient Hospital Stay (HOSPITAL_COMMUNITY): Payer: Medicare Other

## 2023-06-16 DIAGNOSIS — Z9889 Other specified postprocedural states: Secondary | ICD-10-CM | POA: Diagnosis not present

## 2023-06-16 LAB — BASIC METABOLIC PANEL
Anion gap: 9 (ref 5–15)
Anion gap: 5 (ref 5–15)
BUN: 19 mg/dL (ref 8–23)
BUN: 22 mg/dL (ref 8–23)
CO2: 24 mmol/L (ref 22–32)
CO2: 26 mmol/L (ref 22–32)
Calcium: 8.5 mg/dL — ABNORMAL LOW (ref 8.9–10.3)
Calcium: 8.7 mg/dL — ABNORMAL LOW (ref 8.9–10.3)
Chloride: 103 mmol/L (ref 98–111)
Chloride: 106 mmol/L (ref 98–111)
Creatinine, Ser: 1.03 mg/dL — ABNORMAL HIGH (ref 0.44–1.00)
Creatinine, Ser: 1.21 mg/dL — ABNORMAL HIGH (ref 0.44–1.00)
GFR, Estimated: >60 mL/min (ref >60)
GFR, Estimated: 50 mL/min — ABNORMAL LOW (ref >60)
Glucose, Bld: 135 mg/dL — ABNORMAL HIGH (ref 70–99)
Glucose, Bld: 101 mg/dL — ABNORMAL HIGH (ref 70–99)
Potassium: 3.8 mmol/L (ref 3.5–5.1)
Potassium: 5.2 mmol/L — ABNORMAL HIGH (ref 3.5–5.1)
Sodium: 136 mmol/L (ref 135–145)
Sodium: 137 mmol/L (ref 135–145)

## 2023-06-16 LAB — CBC
HCT: 29.7 % — ABNORMAL LOW (ref 36.0–46.0)
HCT: 30.7 % — ABNORMAL LOW (ref 36.0–46.0)
Hemoglobin: 9.3 g/dL — ABNORMAL LOW (ref 12.0–15.0)
Hemoglobin: 9.6 g/dL — ABNORMAL LOW (ref 12.0–15.0)
MCH: 30.2 pg (ref 26.0–34.0)
MCH: 30.7 pg (ref 26.0–34.0)
MCHC: 31.3 g/dL (ref 30.0–36.0)
MCHC: 31.3 g/dL (ref 30.0–36.0)
MCV: 96.4 fL (ref 80.0–100.0)
MCV: 98.1 fL (ref 80.0–100.0)
Platelets: 92 10*3/uL — ABNORMAL LOW (ref 150–400)
Platelets: 84 10*3/uL — ABNORMAL LOW (ref 150–400)
RBC: 3.08 MIL/uL — ABNORMAL LOW (ref 3.87–5.11)
RBC: 3.13 MIL/uL — ABNORMAL LOW (ref 3.87–5.11)
RDW: 15.1 % (ref 11.5–15.5)
RDW: 15.4 % (ref 11.5–15.5)
WBC: 15.7 10*3/uL — ABNORMAL HIGH (ref 4.0–10.5)
WBC: 14.4 10*3/uL — ABNORMAL HIGH (ref 4.0–10.5)
nRBC: 0 % (ref 0.0–0.2)
nRBC: 0 % (ref 0.0–0.2)

## 2023-06-16 LAB — POCT I-STAT 7, (LYTES, BLD GAS, ICA,H+H)
Acid-base deficit: 1 mmol/L (ref 0.0–2.0)
Bicarbonate: 26.2 mmol/L (ref 20.0–28.0)
Calcium, Ion: 1.25 mmol/L (ref 1.15–1.40)
HCT: 27 % — ABNORMAL LOW (ref 36.0–46.0)
Hemoglobin: 9.2 g/dL — ABNORMAL LOW (ref 12.0–15.0)
O2 Saturation: 93 %
Patient temperature: 37
Potassium: 3.7 mmol/L (ref 3.5–5.1)
Sodium: 138 mmol/L (ref 135–145)
TCO2: 28 mmol/L (ref 22–32)
pCO2 arterial: 55.7 mm[Hg] — ABNORMAL HIGH (ref 32–48)
pH, Arterial: 7.281 — ABNORMAL LOW (ref 7.35–7.45)
pO2, Arterial: 78 mm[Hg] — ABNORMAL LOW (ref 83–108)

## 2023-06-16 LAB — PHOSPHORUS: Phosphorus: 3.3 mg/dL (ref 2.5–4.6)

## 2023-06-16 LAB — PROTIME-INR
INR: 1.4 — ABNORMAL HIGH (ref 0.8–1.2)
Prothrombin Time: 17.1 s — ABNORMAL HIGH (ref 11.4–15.2)

## 2023-06-16 LAB — GLUCOSE, CAPILLARY
Glucose-Capillary: 105 mg/dL — ABNORMAL HIGH (ref 70–99)
Glucose-Capillary: 106 mg/dL — ABNORMAL HIGH (ref 70–99)
Glucose-Capillary: 131 mg/dL — ABNORMAL HIGH (ref 70–99)
Glucose-Capillary: 137 mg/dL — ABNORMAL HIGH (ref 70–99)
Glucose-Capillary: 91 mg/dL (ref 70–99)
Glucose-Capillary: 99 mg/dL (ref 70–99)

## 2023-06-16 LAB — MAGNESIUM: Magnesium: 1.8 mg/dL (ref 1.7–2.4)

## 2023-06-16 MED ORDER — MAGNESIUM SULFATE 2 GM/50ML IV SOLN
2.0000 g | Freq: Once | INTRAVENOUS | Status: AC
  Administered 2023-06-16: 2 g via INTRAVENOUS
  Filled 2023-06-16: qty 50

## 2023-06-16 MED ORDER — ORAL CARE MOUTH RINSE
15.0000 mL | OROMUCOSAL | Status: DC | PRN
Start: 2023-06-16 — End: 2023-06-19

## 2023-06-16 MED ORDER — ASPIRIN 81 MG PO CHEW
81.0000 mg | CHEWABLE_TABLET | Freq: Every day | ORAL | Status: DC
  Administered 2023-06-16 – 2023-06-17 (×2): 81 mg via ORAL
  Filled 2023-06-16 (×2): qty 1

## 2023-06-16 MED ORDER — BUMETANIDE 0.25 MG/ML IJ SOLN
2.0000 mg | Freq: Two times a day (BID) | INTRAMUSCULAR | Status: DC
  Administered 2023-06-16 – 2023-06-17 (×3): 2 mg via INTRAVENOUS
  Filled 2023-06-16 (×4): qty 8

## 2023-06-16 MED ORDER — WARFARIN - PHARMACIST DOSING INPATIENT
Freq: Every day | Status: DC
Start: 2023-06-17 — End: 2023-06-18

## 2023-06-16 MED ORDER — LACTULOSE 10 GM/15ML PO SOLN
20.0000 g | Freq: Every day | ORAL | Status: DC
  Administered 2023-06-16 – 2023-06-18 (×3): 20 g via ORAL
  Filled 2023-06-16 (×3): qty 30

## 2023-06-16 MED ORDER — WARFARIN - PHYSICIAN DOSING INPATIENT: Freq: Every day | Status: DC

## 2023-06-16 MED ORDER — FUROSEMIDE 10 MG/ML IJ SOLN
40.0000 mg | Freq: Two times a day (BID) | INTRAMUSCULAR | Status: DC
  Administered 2023-06-16: 40 mg via INTRAVENOUS
  Filled 2023-06-16: qty 4

## 2023-06-16 MED ORDER — ALBUMIN HUMAN 25 % IV SOLN
12.5000 g | Freq: Once | INTRAVENOUS | Status: AC
  Administered 2023-06-16: 12.5 g via INTRAVENOUS
  Filled 2023-06-16: qty 50

## 2023-06-16 MED ORDER — POTASSIUM CHLORIDE CRYS ER 20 MEQ PO TBCR
40.0000 meq | EXTENDED_RELEASE_TABLET | ORAL | Status: AC
  Administered 2023-06-16 (×3): 40 meq via ORAL
  Filled 2023-06-16 (×3): qty 2

## 2023-06-16 MED ORDER — OXYCODONE HCL 5 MG PO TABS
5.0000 mg | ORAL_TABLET | ORAL | Status: DC | PRN
Start: 2023-06-16 — End: 2023-06-21
  Administered 2023-06-16 (×2): 10 mg via ORAL
  Administered 2023-06-17 – 2023-06-20 (×6): 5 mg via ORAL
  Administered 2023-06-20 – 2023-06-21 (×3): 10 mg via ORAL
  Filled 2023-06-16: qty 2
  Filled 2023-06-16 (×2): qty 1
  Filled 2023-06-16 (×2): qty 2
  Filled 2023-06-16 (×4): qty 1
  Filled 2023-06-16 (×2): qty 2

## 2023-06-16 MED ORDER — WARFARIN SODIUM 5 MG PO TABS
5.0000 mg | ORAL_TABLET | Freq: Once | ORAL | Status: AC
  Administered 2023-06-16: 5 mg via ORAL
  Filled 2023-06-16: qty 1

## 2023-06-16 MED ORDER — METOLAZONE 5 MG PO TABS
5.0000 mg | ORAL_TABLET | Freq: Once | ORAL | Status: AC
  Administered 2023-06-16: 5 mg
  Filled 2023-06-16: qty 1

## 2023-06-16 MED FILL — Lidocaine HCl Local Preservative Free (PF) Inj 2%: INTRAMUSCULAR | Qty: 14 | Status: AC

## 2023-06-16 MED FILL — Potassium Chloride Inj 2 mEq/ML: INTRAVENOUS | Qty: 40 | Status: AC

## 2023-06-16 MED FILL — Heparin Sodium (Porcine) Inj 1000 Unit/ML: Qty: 1000 | Status: AC

## 2023-06-16 NOTE — Plan of Care (Signed)
  Problem: Cardiac: Goal: Will achieve and/or maintain hemodynamic stability Outcome: Progressing   Problem: Respiratory: Goal: Respiratory status will improve Outcome: Not Progressing   Problem: Pain Management: Goal: General experience of comfort will improve Outcome: Progressing   Problem: Skin Integrity: Goal: Risk for impaired skin integrity will decrease Outcome: Progressing   Problem: Urinary Elimination: Goal: Ability to achieve and maintain adequate renal perfusion and functioning will improve Outcome: Progressing

## 2023-06-16 NOTE — Progress Notes (Addendum)
 PHARMACY - ANTICOAGULATION CONSULT NOTE  Pharmacy Consult for warfarin Indication:  bioMVR/Afib  Allergies  Allergen Reactions   Ace Inhibitors Cough   Prednisone  Itching and Swelling    Itching on leg and arms swelling from knee to ankles and felt restless    Patient Measurements: Height: 5' 2 (157.5 cm) Weight: 83.2 kg (183 lb 6.8 oz) IBW/kg (Calculated) : 50.1 Heparin  Dosing Weight: 67 kg   Vital Signs: Temp: 98.4 F (36.9 C) (01/10 1100) Temp Source: Bladder (01/10 0800) BP: 103/54 (01/10 1100) Pulse Rate: 59 (01/10 1100)  Labs: Recent Labs    06/14/23 1304 06/14/23 1639 06/15/23 0358 06/15/23 0413 06/15/23 1623 06/15/23 1704 06/15/23 2038 06/16/23 0322 06/16/23 0329  HGB 11.7*   < > 10.9*   < > 10.3*   < > 9.9* 9.3* 9.2*  HCT 35.7*   < > 34.3*   < > 32.6*   < > 29.0* 29.7* 27.0*  PLT 132*   < > 143*  --  113*  --   --  92*  --   APTT 31  --   --   --   --   --   --   --   --   LABPROT 20.9*  --   --   --   --   --   --   --   --   INR 1.8*  --   --   --   --   --   --   --   --   CREATININE  --    < > 1.11*  --  1.28*  --   --  1.03*  --    < > = values in this interval not displayed.    Estimated Creatinine Clearance: 54.4 mL/min (A) (by C-G formula based on SCr of 1.03 mg/dL (H)).   Medical History: Past Medical History:  Diagnosis Date   Allergy    Atrial fibrillation (HCC)    CAD (coronary artery disease) 08/2009   BMS to RCA, non-obstructive in CX and LAD 2011   Cataract    CHF (congestive heart failure) (HCC)    Cirrhosis (HCC)    Dysrhythmia    A. Fib   Edema, peripheral    Heart murmur    Hypertension    Myocardial infarction [facility])    Nausea    Pneumonia    January 2020   Sleep apnea    No CPAP   Trichomoniasis 06/05/2018    Medications:  Scheduled:   acetaminophen   1,000 mg Oral Q6H   Or   acetaminophen  (TYLENOL ) oral liquid 160 mg/5 mL  1,000 mg Per Tube Q6H   arformoterol   15 mcg Nebulization BID   aspirin   81 mg Oral  Daily   atorvastatin   20 mg Per Tube Daily   bisacodyl   10 mg Oral Daily   Or   bisacodyl   10 mg Rectal Daily   Chlorhexidine  Gluconate Cloth  6 each Topical Daily   dextromethorphan -guaiFENesin   1 tablet Oral BID   docusate sodium   200 mg Oral Daily   folic acid   1 mg Oral Daily   furosemide   40 mg Intravenous Q12H   insulin  aspart  0-24 Units Subcutaneous Q4H   lactulose   20 g Oral Daily   metoprolol  tartrate  12.5 mg Oral BID   Or   metoprolol  tartrate  12.5 mg Per Tube BID   multivitamins with iron   1 tablet Oral Daily   mouth rinse  15 mL Mouth Rinse 4 times per day   pantoprazole   40 mg Oral Daily   potassium chloride   40 mEq Oral Q4H   revefenacin   175 mcg Nebulization Daily   sodium chloride  flush  3 mL Intravenous Q12H   sodium chloride  HYPERTONIC  4 mL Nebulization BID   thiamine   100 mg Oral Daily   warfarin  5 mg Oral ONCE-1600   Warfarin - Physician Dosing Inpatient   Does not apply q1600    Assessment: 63 yof presenting for elective MVR/TVR. On apixaban  PTA for Afib (LD 1/3 AM).  Discussed with Dr Maryjane, plan for warfarin. INR 1.8 on 1/8 > INR 1.4 today. Hgb 9.2, plt 92. No s/sx of bleeding.   Goal of Therapy:  INR 2-3 Monitor platelets by anticoagulation protocol: Yes   Plan:  Start warfarin 5 mg tonight per MD tonight, pharmacy to dose on 1/11 Monitor CBC, s/sx of bleeding   Thank you for allowing pharmacy to participate in this patient's care,  Suzen Sour, PharmD, BCCCP Clinical Pharmacist  Phone: [phone] 06/16/2023 1:23 PM  Please check AMION for all [facility] Pharmacy phone numbers After 10:00 PM, call Main Pharmacy [phone]

## 2023-06-16 NOTE — Progress Notes (Signed)
 NAME:  Rhonda [name], MRN:  [id number], DOB:  January 11, 1956, LOS: 2 ADMISSION DATE:  06/14/2023, CONSULTATION DATE:  1/8 REFERRING MD:  1/8, CHIEF COMPLAINT:  s/p MVR and TVR  History of Present Illness:  Patient is a 68 yo F w/ pertinent PMH severe MR and TR, diastolic CHF, alcoholic cirrhosis w/ mild varices (meld score 11), afib on eliquis , HTN, CAD s/p RCA stent 2011, OSA presents to [facility] on 1/8 for elective MVR/TVR.  Followed by cardiology outpt. Patient having worsening dyspnea and edema. Echo with torrential MVR and severe TVR. Cath showing moderate OM. TCTS consulted and recommend surgery. On 1/8 patient came in for elective MVR/TVR. Post-op intubated/sedated. PCCM consulted for ICU care.  In pre-op she had a productive cough and had wheezing post intubation that responded to bronchodilators. Norva was not able to be advanced and is measuring a CVP currently. RV function mildly reduced post-op, started on epi. Transferred on epi, NE, vasopressin . Reversed paralytics in ICU.    EBL 1080, cell saver 602cc Bypass time 1:30 Cross clamp time 1:15   Pertinent  Medical History   Past Medical History:  Diagnosis Date   Allergy    Atrial fibrillation (HCC)    CAD (coronary artery disease) 08/2009   BMS to RCA, non-obstructive in CX and LAD 2011   Cataract    CHF (congestive heart failure) (HCC)    Cirrhosis (HCC)    Dysrhythmia    A. Fib   Edema, peripheral    Heart murmur    Hypertension    Myocardial infarction [facility])    Nausea    Pneumonia    January 2020   Sleep apnea    No CPAP   Trichomoniasis 06/05/2018     Significant Hospital Events: Including procedures, antibiotic start and stop dates in addition to other pertinent events   1/8 post op MVR/TVR; pccm consulted  1/9 failed wean due to hypercapnia and acidosis 1/9 ongoing hypercapnia despite adjusting BiPAP multiple times  Interim History / Subjective:  Awake, feels ok. Eager to get up. ABG with ongoing  hypercapnia (has been present since initial wean post op). Weight up 13bs. IO net +3.6L. Down 4lbs since yesterday.  Objective   Blood pressure (!) 107/53, pulse (!) 59, temperature 98.6 F (37 C), temperature source Bladder, resp. rate 17, height 5' 2 (1.575 m), weight 83.2 kg, SpO2 93%. CVP:  [8 mmHg-35 mmHg] 23 mmHg  Vent Mode: PCV;BIPAP FiO2 (%):  [40 %] 40 % Set Rate:  [12 bmp-24 bmp] 24 bmp PEEP:  [5 cmH20] 5 cmH20 Pressure Support:  [12 cmH20] 12 cmH20   Intake/Output Summary (Last 24 hours) at 06/16/2023 0921 Last data filed at 06/16/2023 0800 Gross per 24 hour  Intake 1688.09 ml  Output 1922 ml  Net -233.91 ml   Filed Weights   06/14/23 0703 06/15/23 0530 06/16/23 0530  Weight: 77.1 kg 85 kg 83.2 kg   Examination: General: Adult female, resting in bed, in NAD. Neuro: Awake, follows commands, MAE's. HEENT: [place]/AT. Sclerae anicteric. Cardiovascular: RRR, no M/R/G.  Lungs: Respirations even and unlabored.  CTA bilaterally, No W/R/R. Abdomen: BS x 4, soft, NT/ND.  Musculoskeletal: No gross deformities, no edema.  Skin: Intact, warm, no rashes.  CXR personally reviewed>  mild pulmonary edema, RIJ introducer with swan retracted into catheter. ETT 2 cm above carina.   EKG: pending Post op labs pending   CT chest 1/6 personally reviewed> R dependent effusion, mild RML consolidation with air bronchograms, peripheral  nodule, possible mucus plugging in proximal segmental bronchi medially. LLL bronchiectasis.   No PFTs on file.    Assessment & Plan:   Severe MR, s/p MV repair, TR s/p tricuspid repair. -post-op care per TCTS -complete post op antibiotics -pain control per protocol- morphine , tramadol , oxycodone  -aspirin , statin daily - continue BB -chest tube and pacing wires to be removed today -Wean pressors, currently only on vaso  Volume overload - 13lb weight gain since admit, net +3.6L since admit. - Continue diuresis, 40mg  q12hrs x 2. - Add Albumin  x 1,  Metolazone  5mg  x 1.  H/o CAD, s/p DES 2011 H/o HFpEF HTN -hold PTA metoprolol , spiro, digoxin , dilt  Valvular Afib, chronic -on diltiazem , metoprolol , and digoxin  PTA- anticipate difficulty with rate control  Acute hypoxic and hypercapnic respiratory failure. - Continue supplemental O2 as needed to maintain SpO2 > 92%. - Continue BiPAP with naps and at night.  Chronic R pleural effusion likely due to cirrhosis, POA - Continue diuresis. - Will assess bedside POCUS to assess if has excess fluid to warrant thoracentesis.  COPD, bronchiectasis seen on CT with chronic cough H/o OSA - pulmonary hygiene- added hypertonic saline BID & mucinex  to thin secretions. - reinforce sternal precautions with her chronic cough - brovana  & yupelri   Hyperglycemia; h/o prediabetes. A1c 6.2. - SSI.  AKI. Hypomagnesemia - s/p repletion. - Follow BMP.  Hyperammonemia, mild. - Add daily Lactulose , 20mg  to start.  H/o tobacco abuse; quit 06/2023 RML nodule peripherally -recommend sticking with tobacco cessation -recommend follow up CT as OP  H/o GERD -con't PPI  Alcoholic cirrhosis w/ mild varices (meld score 11) -monitor volume status closely - avoid hepatically metabolized meds - Lasix  as above - Resume spiro when able -monitor for encephalopathy -no baseline anemia or thrombocytopenia -vitamins, unsure when her last drink was   Best Practice (right click and Reselect all SmartList Selections daily)   Diet/type: reg heart healthy DVT prophylaxis: SCD GI prophylaxis: PPI Lines: Arterial Line Foley:  Yes, and it is still needed Code Status:  full code Last date of multidisciplinary goals of care discussion [per primary]   Sammi Gore, PA - C [place] [facility] For pager details, please see AMION or use Epic chat  After 1900, please call [facility] for cross coverage needs 06/16/2023, 9:21 AM

## 2023-06-16 NOTE — Progress Notes (Signed)
 Patient ID: Rhonda [name], female   DOB: 10/15/1955, 68 y.o.   MRN: [id number]  TCTS Evening Rounds:  Hemodynamically stable in junctional rhythm 65.  Epi and vaso off.   UO 35/hr today. Did not diurese with lasix  and metolazone . Creat normal this am.  Sats 98% on 5L HFNC.

## 2023-06-16 NOTE — Progress Notes (Signed)
 [address]       [place] [address]             [phone]      2 Days Post-Op  Procedure(s) (LRB): MITRAL VALVE REPAIR USING 32 MM SMUFORM SEMI-RIGID ANNULOPLASTY RING (N/A) TRICUSPID VALVE REPAIR USING 32 MM EDWARDS MC3 TRICUSPID ANNULOPLASTY RING (N/A) CLIPPING OF ATRIAL APPENDAGE USING 50 MM MEDTRONIC PENDITURE LAA CLIP (N/A) TRANSESOPHAGEAL ECHOCARDIOGRAM (N/A)   Total Length of Stay:  LOS: 2 days    SUBJECTIVE: Was on bipap overnight No complaints other than dry mouth Weaning inotropes  Vitals:   06/16/23 0645 06/16/23 0700  BP:  (!) 102/51  Pulse: (!) 57 (!) 57  Resp: 20 18  Temp: 98.4 F (36.9 C) 98.4 F (36.9 C)  SpO2: 94% 95%    Intake/Output      01/09 0701 01/10 0700 01/10 0701 01/11 0700   I.V. (mL/kg) 1005 (12.1)    Blood     NG/GT     IV Piggyback 741.3    Total Intake(mL/kg) 1746.2 (21)    Urine (mL/kg/hr) 1727 (0.9)    Emesis/NG output     Blood     Chest Tube 80    Total Output 1807    Net -60.8             dexmedetomidine  (PRECEDEX ) IV infusion Stopped (06/15/23 1732)   epinephrine  3 mcg/min (06/16/23 0600)   norepinephrine  (LEVOPHED ) Adult infusion Stopped (06/15/23 1513)   vasopressin  0.03 Units/min (06/16/23 0600)    CBC    Component Value Date/Time   WBC 15.7 (H) 06/16/2023 0322   RBC 3.08 (L) 06/16/2023 0322   HGB 9.2 (L) 06/16/2023 0329   HGB 12.1 10/08/2020 1540   HCT 27.0 (L) 06/16/2023 0329   HCT 37.4 10/08/2020 1540   PLT 92 (L) 06/16/2023 0322   PLT 195 10/08/2020 1540   MCV 96.4 06/16/2023 0322   MCV 87 10/08/2020 1540   MCH 30.2 06/16/2023 0322   MCHC 31.3 06/16/2023 0322   RDW 15.1 06/16/2023 0322   RDW 17.9 (H) 10/08/2020 1540   LYMPHSABS 1.6 03/01/2023 0913   LYMPHSABS 1.4 07/17/2020 1440   MONOABS 0.7 03/01/2023 0913   EOSABS 0.2 03/01/2023 0913   EOSABS 0.1 07/17/2020 1440   BASOSABS 0.0 03/01/2023 0913   BASOSABS 0.0 07/17/2020 1440   CMP     Component Value  Date/Time   NA 138 06/16/2023 0329   NA 135 12/17/2020 1208   K 3.7 06/16/2023 0329   CL 103 06/16/2023 0322   CO2 24 06/16/2023 0322   GLUCOSE 135 (H) 06/16/2023 0322   BUN 19 06/16/2023 0322   BUN 22 12/17/2020 1208   CREATININE 1.03 (H) 06/16/2023 0322   CALCIUM  8.5 (L) 06/16/2023 0322   PROT 6.5 06/12/2023 1200   PROT 6.7 10/30/2020 1523   ALBUMIN  3.5 06/12/2023 1200   ALBUMIN  4.2 10/30/2020 1523   AST 19 06/12/2023 1200   ALT 13 06/12/2023 1200   ALKPHOS 67 06/12/2023 1200   BILITOT 1.2 06/12/2023 1200   BILITOT 0.5 10/30/2020 1523   GFRNONAA >60 06/16/2023 0322   GFRAA 46 (L) 07/17/2020 1440   ABG    Component Value Date/Time   PHART 7.281 (L) 06/16/2023 0329   PCO2ART 55.7 (H) 06/16/2023 0329   PO2ART 78 (L) 06/16/2023 0329   HCO3 26.2 06/16/2023 0329   TCO2 28 06/16/2023 0329   ACIDBASEDEF 1.0 06/16/2023 0329   O2SAT 93  06/16/2023 0329   CBG (last 3)  Recent Labs    06/15/23 2047 06/15/23 2350 06/16/23 0327  GLUCAP 142* 149* 137*  EXAM Lungs: rhonchorous  Card:RR with no murmur Ext: warm but edematous Neuro: intact   ASSESSMENT: POD #2 sp MV/TV repair Hemodynamics stable and weaning inotropes for SBP >110. Hopefully if off later can remove art line (its better than cuff) Renal: needs diuresis. Better response after evening dose Pulmonary: on cxr looks wet and atelectatic. Continue bipap as per CCM and continue diuresis Start Coumadin  Remove Chest tubes and pacing wires    Deward Kallman, MD 06/16/2023

## 2023-06-16 NOTE — Plan of Care (Signed)

## 2023-06-17 ENCOUNTER — Inpatient Hospital Stay (HOSPITAL_COMMUNITY): Payer: Medicare Other

## 2023-06-17 LAB — CBC
HCT: 30.7 % — ABNORMAL LOW (ref 36.0–46.0)
Hemoglobin: 9.6 g/dL — ABNORMAL LOW (ref 12.0–15.0)
MCH: 30.5 pg (ref 26.0–34.0)
MCHC: 31.3 g/dL (ref 30.0–36.0)
MCV: 97.5 fL (ref 80.0–100.0)
Platelets: 91 10*3/uL — ABNORMAL LOW (ref 150–400)
RBC: 3.15 MIL/uL — ABNORMAL LOW (ref 3.87–5.11)
RDW: 15.7 % — ABNORMAL HIGH (ref 11.5–15.5)
WBC: 12.4 10*3/uL — ABNORMAL HIGH (ref 4.0–10.5)
nRBC: 0 % (ref 0.0–0.2)

## 2023-06-17 LAB — BASIC METABOLIC PANEL
Anion gap: 8 (ref 5–15)
BUN: 26 mg/dL — ABNORMAL HIGH (ref 8–23)
CO2: 26 mmol/L (ref 22–32)
Calcium: 8.8 mg/dL — ABNORMAL LOW (ref 8.9–10.3)
Chloride: 103 mmol/L (ref 98–111)
Creatinine, Ser: 1.17 mg/dL — ABNORMAL HIGH (ref 0.44–1.00)
GFR, Estimated: 52 mL/min — ABNORMAL LOW (ref >60)
Glucose, Bld: 92 mg/dL (ref 70–99)
Potassium: 5.2 mmol/L — ABNORMAL HIGH (ref 3.5–5.1)
Sodium: 137 mmol/L (ref 135–145)

## 2023-06-17 LAB — PROTIME-INR
INR: 1.7 — ABNORMAL HIGH (ref 0.8–1.2)
Prothrombin Time: 20.6 s — ABNORMAL HIGH (ref 11.4–15.2)

## 2023-06-17 LAB — GLUCOSE, CAPILLARY
Glucose-Capillary: 81 mg/dL (ref 70–99)
Glucose-Capillary: 114 mg/dL — ABNORMAL HIGH (ref 70–99)
Glucose-Capillary: 87 mg/dL (ref 70–99)
Glucose-Capillary: 91 mg/dL (ref 70–99)

## 2023-06-17 LAB — MAGNESIUM: Magnesium: 2 mg/dL (ref 1.7–2.4)

## 2023-06-17 LAB — PHOSPHORUS: Phosphorus: 3.6 mg/dL (ref 2.5–4.6)

## 2023-06-17 MED ORDER — PNEUMOCOCCAL 20-VAL CONJ VACC 0.5 ML IM SUSY
0.5000 mL | PREFILLED_SYRINGE | INTRAMUSCULAR | Status: DC
  Filled 2023-06-17: qty 0.5

## 2023-06-17 MED ORDER — WARFARIN SODIUM 5 MG PO TABS
5.0000 mg | ORAL_TABLET | Freq: Once | ORAL | Status: DC
  Filled 2023-06-17: qty 1

## 2023-06-17 MED ORDER — METOLAZONE 5 MG PO TABS
5.0000 mg | ORAL_TABLET | Freq: Once | ORAL | Status: AC
Start: 2023-06-17 — End: 2023-06-17
  Administered 2023-06-17: 5 mg via ORAL
  Filled 2023-06-17: qty 1

## 2023-06-17 NOTE — Plan of Care (Signed)
  Problem: Education: Goal: Knowledge of General Education information will improve Description: Including pain rating scale, medication(s)/side effects and non-pharmacologic comfort measures Outcome: Progressing   Problem: Health Behavior/Discharge Planning: Goal: Ability to manage health-related needs will improve Outcome: Progressing   Problem: Activity: Goal: Risk for activity intolerance will decrease Outcome: Progressing   Problem: Nutrition: Goal: Adequate nutrition will be maintained Outcome: Progressing   Problem: Coping: Goal: Level of anxiety will decrease Outcome: Progressing   Problem: Elimination: Goal: Will not experience complications related to urinary retention Outcome: Progressing   Problem: Pain Management: Goal: General experience of comfort will improve Outcome: Progressing   Problem: Safety: Goal: Ability to remain free from injury will improve Outcome: Progressing

## 2023-06-17 NOTE — Progress Notes (Signed)
   06/17/23 0028  Vent Select  Invasive or Noninvasive Noninvasive  Adult Vent Y  Adult Ventilator Settings  Vent Type Servo i  Vent Mode BIPAP;PCV  Set Rate 20 bmp  FiO2 (%) 45 %  Pressure Control 12 cmH20  PEEP 5 cmH20  Adult Ventilator Measurements  Peak Airway Pressure 16 L/min  Mean Airway Pressure 8 cmH20  Resp Rate Total 22 br/min  Exhaled Vt 472 mL  Measured Ve 8.2 L  I:E Ratio Measured 1:2.2  Auto PEEP 0 cmH20  Total PEEP 5 cmH20  SpO2 92 %  Adult Ventilator Alarms  Alarms On Y  Ve High Alarm 18 L/min  Ve Low Alarm 4 L/min  Resp Rate High Alarm 36 br/min  Resp Rate Low Alarm 10  PEEP Low Alarm 3 cmH2O  Press High Alarm 30 cmH2O  VAP Prevention  HOB> 30 Degrees Y  Breath Sounds  Bilateral Breath Sounds Diminished

## 2023-06-17 NOTE — Progress Notes (Signed)
 PHARMACY - ANTICOAGULATION CONSULT NOTE  Pharmacy Consult for warfarin Indication:  bioMVR/Afib  Allergies  Allergen Reactions   Ace Inhibitors Cough   Prednisone  Itching and Swelling    Itching on leg and arms swelling from knee to ankles and felt restless    Patient Measurements: Height: 5' 2 (157.5 cm) Weight: 82.4 kg (181 lb 10.5 oz) IBW/kg (Calculated) : 50.1 Heparin  Dosing Weight: 67 kg   Vital Signs: Temp: 98.3 F (36.8 C) (01/11 0743) Temp Source: Oral (01/11 0743) BP: 103/54 (01/11 0800) Pulse Rate: 67 (01/11 0800)  Labs: Recent Labs    06/14/23 1304 06/14/23 1639 06/16/23 0322 06/16/23 0329 06/16/23 1444 06/16/23 1630 06/17/23 0413  HGB 11.7*   < > 9.3* 9.2*  --  9.6* 9.6*  HCT 35.7*   < > 29.7* 27.0*  --  30.7* 30.7*  PLT 132*   < > 92*  --   --  84* 91*  APTT 31  --   --   --   --   --   --   LABPROT 20.9*  --   --   --  17.1*  --  20.6*  INR 1.8*  --   --   --  1.4*  --  1.7*  CREATININE  --    < > 1.03*  --   --  1.21* 1.17*   < > = values in this interval not displayed.    Estimated Creatinine Clearance: 47.7 mL/min (A) (by C-G formula based on SCr of 1.17 mg/dL (H)).   Medical History: Past Medical History:  Diagnosis Date   Allergy    Atrial fibrillation (HCC)    CAD (coronary artery disease) 08/2009   BMS to RCA, non-obstructive in CX and LAD 2011   Cataract    CHF (congestive heart failure) (HCC)    Cirrhosis (HCC)    Dysrhythmia    A. Fib   Edema, peripheral    Heart murmur    Hypertension    Myocardial infarction [facility])    Nausea    Pneumonia    January 2020   Sleep apnea    No CPAP   Trichomoniasis 06/05/2018    Medications:  Scheduled:   acetaminophen   1,000 mg Oral Q6H   Or   acetaminophen  (TYLENOL ) oral liquid 160 mg/5 mL  1,000 mg Per Tube Q6H   arformoterol   15 mcg Nebulization BID   aspirin   81 mg Oral Daily   atorvastatin   20 mg Per Tube Daily   bisacodyl   10 mg Oral Daily   Or   bisacodyl   10 mg Rectal  Daily   bumetanide  (BUMEX ) IV  2 mg Intravenous Q12H   Chlorhexidine  Gluconate Cloth  6 each Topical Daily   dextromethorphan -guaiFENesin   1 tablet Oral BID   docusate sodium   200 mg Oral Daily   folic acid   1 mg Oral Daily   insulin  aspart  0-24 Units Subcutaneous Q4H   lactulose   20 g Oral Daily   metolazone   5 mg Oral Once   metoprolol  tartrate  12.5 mg Oral BID   Or   metoprolol  tartrate  12.5 mg Per Tube BID   multivitamins with iron   1 tablet Oral Daily   mouth rinse  15 mL Mouth Rinse 4 times per day   revefenacin   175 mcg Nebulization Daily   sodium chloride  flush  3 mL Intravenous Q12H   sodium chloride  HYPERTONIC  4 mL Nebulization BID   thiamine   100 mg Oral  Daily   warfarin  5 mg Oral ONCE-1600   Warfarin - Pharmacist Dosing Inpatient   Does not apply q1600    Assessment: Rhonda [name] presenting for elective MVR/TVR. On apixaban  PTA for Afib (LD 1/3 AM).  Discussed with Dr Maryjane, plan for warfarin.  INR is subtherapeutic but increasing  at 1.7 (+0.3) on warfarin 5 mg x1. CBC is stable. No signs of bleeding/bruising, Diet is improving.   Goal of Therapy:  INR 2-3 Monitor platelets by anticoagulation protocol: Yes   Plan:  Give warfarin 5 mg tonight. Monitor CBC, s/sx of bleeding   Thank you for allowing pharmacy to participate in this patient's care,  Signe Dawn, PharmD PGY2 Cardiology Pharmacy Resident Phone: [phone] 06/17/2023 9:00 AM  Please check AMION for all [facility] Pharmacy phone numbers After 10:00 PM, call Main Pharmacy [phone]

## 2023-06-17 NOTE — Progress Notes (Signed)
 Patient ID: Rhonda [name], female   DOB: [birth year], 68 y.o.   MRN: [id number]  TCTS Evening Rounds:  Hemodynamically stable. HR 75. Looks regular at times like junctional but irregular at times also.  Good UO today. -1400 cc so far.  CVP 13

## 2023-06-17 NOTE — Progress Notes (Signed)
 06/17/2023 Doing better Labs/imaging reviewed Continue diuresis Will see again formally tomorrow she is getting a bath at present

## 2023-06-17 NOTE — Progress Notes (Signed)
 3 Days Post-Op Procedure(s) (LRB): MITRAL VALVE REPAIR USING 32 MM SMUFORM SEMI-RIGID ANNULOPLASTY RING (N/A) TRICUSPID VALVE REPAIR USING 32 MM EDWARDS MC3 TRICUSPID ANNULOPLASTY RING (N/A) CLIPPING OF ATRIAL APPENDAGE USING 50 MM MEDTRONIC PENDITURE LAA CLIP (N/A) TRANSESOPHAGEAL ECHOCARDIOGRAM (N/A) Subjective:  Tired. Says she did not sleep that well. Not much appetite yet but reportedly ate 25%. Ambulated 20 ft with 2 person assist this am. Sitting up in chair.    Objective: Vital signs in last 24 hours: Temp:  [97.7 F (36.5 C)-99 F (37.2 C)] 98.3 F (36.8 C) (01/11 0743) Pulse Rate:  [54-78] 62 (01/11 0909) Cardiac Rhythm: Junctional rhythm (01/11 0400) Resp:  [15-33] 20 (01/11 0909) BP: (91-116)/(45-84) 103/54 (01/11 0800) SpO2:  [91 %-100 %] 96 % (01/11 0800) Arterial Line BP: (121-133)/(44-50) 127/47 (01/10 1215) FiO2 (%):  [32 %-45 %] 32 % (01/11 0909) Weight:  [82.4 kg] 82.4 kg (01/11 0500)  Hemodynamic parameters for last 24 hours: CVP:  [2 mmHg-39 mmHg] 13 mmHg  Intake/Output from previous day: 01/10 0701 - 01/11 0700 In: 115.9 [I.V.:25.8; IV Piggyback:90.1] Out: 1776 [Urine:1725; Stool:1; Chest Tube:50] Intake/Output this shift: Total I/O In: 120 [P.O.:120] Out: -   General appearance: alert and cooperative Neurologic: intact Heart: regular rate and rhythm, S1, S2 normal, no murmur Lungs: diminished breath sounds bibasilar Extremities: edema mild Wound: incision ok  Lab Results: Recent Labs    06/16/23 1630 06/17/23 0413  WBC 14.4* 12.4*  HGB 9.6* 9.6*  HCT 30.7* 30.7*  PLT 84* 91*   BMET:  Recent Labs    06/16/23 1630 06/17/23 0413  NA 137 137  K 5.2* 5.2*  CL 106 103  CO2 26 26  GLUCOSE 101* 92  BUN 22 26*  CREATININE 1.21* 1.17*  CALCIUM  8.7* 8.8*    PT/INR:  Recent Labs    06/17/23 0413  LABPROT 20.6*  INR 1.7*   ABG    Component Value Date/Time   PHART 7.281 (L) 06/16/2023 0329   HCO3 26.2 06/16/2023 0329   TCO2 28  06/16/2023 0329   ACIDBASEDEF 1.0 06/16/2023 0329   O2SAT 93 06/16/2023 0329   CBG (last 3)  Recent Labs    06/16/23 2354 06/17/23 0420 06/17/23 0741  GLUCAP 105* 81 114*   CXR: cardiomegaly. Small bilateral pleural effusions and bibasilar atelectasis.  Assessment/Plan: S/P Procedure(s) (LRB): MITRAL VALVE REPAIR USING 32 MM SMUFORM SEMI-RIGID ANNULOPLASTY RING (N/A) TRICUSPID VALVE REPAIR USING 32 MM EDWARDS MC3 TRICUSPID ANNULOPLASTY RING (N/A) CLIPPING OF ATRIAL APPENDAGE USING 50 MM MEDTRONIC PENDITURE LAA CLIP (N/A) TRANSESOPHAGEAL ECHOCARDIOGRAM (N/A)  POD 3  Hemodynamically stable in what looks like atrial fib or junctional 60's. It is irregular at times. Not on beta blocker or amio. Continue to follow.  -1600 cc yesterday with Bumex . Wt down 2 lbs but still 11.6 lbs over preop. Bumex  and metolazone  ordered today. K+ 5.2 so hold off on replacement for now.  INR bumped to 1.7. Coumadin  per pharmacy.   Continue IS, OOB. PT consult.  Keep sleeve in today. Probably remove tomorrow.   Continue foley since diuresing.   LOS: 3 days    Rhonda [name] 06/17/2023

## 2023-06-18 LAB — BASIC METABOLIC PANEL
Anion gap: 9 (ref 5–15)
Anion gap: 9 (ref 5–15)
Anion gap: 10 (ref 5–15)
BUN: 21 mg/dL (ref 8–23)
BUN: 20 mg/dL (ref 8–23)
BUN: 20 mg/dL (ref 8–23)
CO2: 35 mmol/L — ABNORMAL HIGH (ref 22–32)
CO2: 37 mmol/L — ABNORMAL HIGH (ref 22–32)
CO2: 38 mmol/L — ABNORMAL HIGH (ref 22–32)
Calcium: 9.2 mg/dL (ref 8.9–10.3)
Calcium: 9.2 mg/dL (ref 8.9–10.3)
Calcium: 9.1 mg/dL (ref 8.9–10.3)
Chloride: 93 mmol/L — ABNORMAL LOW (ref 98–111)
Chloride: 92 mmol/L — ABNORMAL LOW (ref 98–111)
Chloride: 88 mmol/L — ABNORMAL LOW (ref 98–111)
Creatinine, Ser: 1.02 mg/dL — ABNORMAL HIGH (ref 0.44–1.00)
Creatinine, Ser: 0.98 mg/dL (ref 0.44–1.00)
Creatinine, Ser: 1.16 mg/dL — ABNORMAL HIGH (ref 0.44–1.00)
GFR, Estimated: >60 mL/min (ref >60)
GFR, Estimated: >60 mL/min (ref >60)
GFR, Estimated: 52 mL/min — ABNORMAL LOW (ref >60)
Glucose, Bld: 77 mg/dL (ref 70–99)
Glucose, Bld: 76 mg/dL (ref 70–99)
Glucose, Bld: 116 mg/dL — ABNORMAL HIGH (ref 70–99)
Potassium: 3.4 mmol/L — ABNORMAL LOW (ref 3.5–5.1)
Potassium: 3.3 mmol/L — ABNORMAL LOW (ref 3.5–5.1)
Potassium: 3.6 mmol/L (ref 3.5–5.1)
Sodium: 137 mmol/L (ref 135–145)
Sodium: 138 mmol/L (ref 135–145)
Sodium: 136 mmol/L (ref 135–145)

## 2023-06-18 LAB — CBC
HCT: 30.3 % — ABNORMAL LOW (ref 36.0–46.0)
HCT: 30.8 % — ABNORMAL LOW (ref 36.0–46.0)
HCT: 30.4 % — ABNORMAL LOW (ref 36.0–46.0)
Hemoglobin: 9.6 g/dL — ABNORMAL LOW (ref 12.0–15.0)
Hemoglobin: 9.7 g/dL — ABNORMAL LOW (ref 12.0–15.0)
Hemoglobin: 9.7 g/dL — ABNORMAL LOW (ref 12.0–15.0)
MCH: 30.4 pg (ref 26.0–34.0)
MCH: 30.1 pg (ref 26.0–34.0)
MCH: 30.1 pg (ref 26.0–34.0)
MCHC: 31.7 g/dL (ref 30.0–36.0)
MCHC: 31.5 g/dL (ref 30.0–36.0)
MCHC: 31.9 g/dL (ref 30.0–36.0)
MCV: 95.9 fL (ref 80.0–100.0)
MCV: 95.7 fL (ref 80.0–100.0)
MCV: 94.4 fL (ref 80.0–100.0)
Platelets: 98 10*3/uL — ABNORMAL LOW (ref 150–400)
Platelets: 101 10*3/uL — ABNORMAL LOW (ref 150–400)
Platelets: 117 10*3/uL — ABNORMAL LOW (ref 150–400)
RBC: 3.16 MIL/uL — ABNORMAL LOW (ref 3.87–5.11)
RBC: 3.22 MIL/uL — ABNORMAL LOW (ref 3.87–5.11)
RBC: 3.22 MIL/uL — ABNORMAL LOW (ref 3.87–5.11)
RDW: 15.1 % (ref 11.5–15.5)
RDW: 15.1 % (ref 11.5–15.5)
RDW: 14.8 % (ref 11.5–15.5)
WBC: 8.8 10*3/uL (ref 4.0–10.5)
WBC: 8.7 10*3/uL (ref 4.0–10.5)
WBC: 10.4 10*3/uL (ref 4.0–10.5)
nRBC: 0 % (ref 0.0–0.2)
nRBC: 0 % (ref 0.0–0.2)
nRBC: 0 % (ref 0.0–0.2)

## 2023-06-18 LAB — PROTIME-INR
INR: 6 (ref 0.8–1.2)
INR: 2.4 — ABNORMAL HIGH (ref 0.8–1.2)
Prothrombin Time: 53.8 s — ABNORMAL HIGH (ref 11.4–15.2)
Prothrombin Time: 26.3 s — ABNORMAL HIGH (ref 11.4–15.2)

## 2023-06-18 LAB — MAGNESIUM
Magnesium: 1.5 mg/dL — ABNORMAL LOW (ref 1.7–2.4)
Magnesium: 1.4 mg/dL — ABNORMAL LOW (ref 1.7–2.4)
Magnesium: 2.1 mg/dL (ref 1.7–2.4)

## 2023-06-18 LAB — GLUCOSE, CAPILLARY
Glucose-Capillary: 111 mg/dL — ABNORMAL HIGH (ref 70–99)
Glucose-Capillary: 116 mg/dL — ABNORMAL HIGH (ref 70–99)
Glucose-Capillary: 126 mg/dL — ABNORMAL HIGH (ref 70–99)
Glucose-Capillary: 78 mg/dL (ref 70–99)
Glucose-Capillary: 84 mg/dL (ref 70–99)

## 2023-06-18 LAB — PHOSPHORUS: Phosphorus: 2.4 mg/dL — ABNORMAL LOW (ref 2.5–4.6)

## 2023-06-18 MED ORDER — VITAMIN K1 10 MG/ML IJ SOLN
1.0000 mg | Freq: Once | INTRAVENOUS | Status: AC
  Administered 2023-06-18: 1 mg via INTRAVENOUS
  Filled 2023-06-18: qty 0.1

## 2023-06-18 MED ORDER — MAGNESIUM SULFATE 2 GM/50ML IV SOLN
2.0000 g | Freq: Once | INTRAVENOUS | Status: AC
  Administered 2023-06-18: 2 g via INTRAVENOUS
  Filled 2023-06-18: qty 50

## 2023-06-18 MED ORDER — WARFARIN SODIUM 2.5 MG PO TABS
2.5000 mg | ORAL_TABLET | Freq: Once | ORAL | Status: AC
  Administered 2023-06-18: 2.5 mg via ORAL
  Filled 2023-06-18: qty 1

## 2023-06-18 MED ORDER — INSULIN ASPART 100 UNIT/ML IJ SOLN: 0.0000 [IU] | Freq: Three times a day (TID) | INTRAMUSCULAR | Status: DC

## 2023-06-18 MED ORDER — POTASSIUM CHLORIDE CRYS ER 20 MEQ PO TBCR
20.0000 meq | EXTENDED_RELEASE_TABLET | ORAL | Status: AC
Start: 2023-06-18 — End: 2023-06-18
  Administered 2023-06-18 (×3): 20 meq via ORAL
  Filled 2023-06-18 (×3): qty 1

## 2023-06-18 MED ORDER — WARFARIN - PHYSICIAN DOSING INPATIENT
Freq: Every day | Status: DC
Start: 2023-06-19 — End: 2023-06-21

## 2023-06-18 MED ORDER — POTASSIUM CHLORIDE CRYS ER 20 MEQ PO TBCR
40.0000 meq | EXTENDED_RELEASE_TABLET | Freq: Once | ORAL | Status: AC
  Administered 2023-06-18: 40 meq via ORAL
  Filled 2023-06-18: qty 2

## 2023-06-18 NOTE — Evaluation (Signed)
 Physical Therapy Evaluation Patient Details Name: Rhonda [name] MRN: [id number] DOB: 25-Jun-1955 Today's Date: 06/18/2023  History of Present Illness  Pt is 68 yo presenting to [facility] on 1/8 for elective MVR/TVR. PMH: severe MR and TR, diastolic CHF, alcoholic cirrhosis w/mild varices. Afib on eliquis , HTN, CAD s/p RCA stent 2011, OSA.  Clinical Impression  Pt is presenting below baseline level of functioning. Currently pt is supervision to Min A for sit to stand, supervision for gait with RW with slow steady progress due to recent surgery. Pt has some assistance at home as needed and has prepared well for home after hospitalization with frozen meals in the freezer. Due to pt current functional status, home set up and available assistance at home no recommended skilled physical therapy services at this time on discharge from acute care hospital setting. Will continue to follow in acute setting in order to ensure that pt returns home with decreased risk for falls, injury, re-hospitalization and improved activity tolerance.          If plan is discharge home, recommend the following: Assist for transportation;Help with stairs or ramp for entrance;Assistance with cooking/housework     Equipment Recommendations Rolling walker (2 wheels)     Functional Status Assessment Patient has had a recent decline in their functional status and demonstrates the ability to make significant improvements in function in a reasonable and predictable amount of time.     Precautions / Restrictions Precautions Precautions: Fall;Sternal Precaution Booklet Issued: No Precaution Comments: reviewed sternal precautions Restrictions Weight Bearing Restrictions Per Provider Order: Yes RUE Weight Bearing Per Provider Order: Non weight bearing LUE Weight Bearing Per Provider Order: Non weight bearing      Mobility  Bed Mobility     General bed mobility comments: pt was received in recliner and returned to  recliner at end of session on request. Pt has a lift chair that she can sleep in at home if needed.    Transfers Overall transfer level: Needs assistance Equipment used: None Transfers: Sit to/from Stand Sit to Stand: Min assist, Supervision           General transfer comment: initially pt requires Min A for sit to stand from recliner. Second attempt pt was supervision with verbal cues for hand placement prior to sitting and standing to maintain sternal precautions.    Ambulation/Gait Ambulation/Gait assistance: Supervision Gait Distance (Feet): 250 Feet Assistive device: Rolling walker (2 wheels) Gait Pattern/deviations: Step-through pattern, Decreased stride length Gait velocity: decreased Gait velocity interpretation: 1.31 - 2.62 ft/sec, indicative of limited community ambulator   General Gait Details: slow and steady  Stairs Stairs:  (deferred today. Pt has a level entry and can stay downstairs with a full bathroom and lift recliner she can sleep in)            Balance Overall balance assessment: Mild deficits observed, not formally tested               Pertinent Vitals/Pain Pain Assessment Pain Assessment: 0-10 Pain Score: 4  Pain Location: surgical site Pain Descriptors / Indicators: Aching Pain Intervention(s): Monitored during session    Home Living Family/patient expects to be discharged to:: Private residence Living Arrangements: Non-relatives/Friends (lives with a roommate named Clarita who is not very helpful) Available Help at Discharge: Other (Comment) (she has someone she is paying to care for her pets while she is in the hospital. She states that she can pay them to help her when she discharges if needed.)  Type of Home: House Home Access: Level entry     Alternate Level Stairs-Number of Steps: flight Home Layout: Two level;Able to live on main level with bedroom/bathroom Home Equipment: Grab bars - tub/shower;Shower seat - built in;Cane -  single point Additional Comments: Pt states she can sleep downstairs on the sofa or recliner.    Prior Function Prior Level of Function : Independent/Modified Independent;Driving (daughter lives in [place] )             Mobility Comments: Pt denies falls. Reports that she has not been using an AD for ambulation. Pt was walking her dogs independently and active. ADLs Comments: Ind with ADL's and IADl's.     Extremity/Trunk Assessment   Upper Extremity Assessment Upper Extremity Assessment: Overall WFL for tasks assessed (within sternal precautions)    Lower Extremity Assessment Lower Extremity Assessment: Overall WFL for tasks assessed    Cervical / Trunk Assessment Cervical / Trunk Assessment: Normal  Communication   Communication Communication: No apparent difficulties Cueing Techniques: Verbal cues  Cognition Arousal: Alert Behavior During Therapy: WFL for tasks assessed/performed Overall Cognitive Status: Within Functional Limits for tasks assessed       General Comments: pt able to re-call all precautions        General Comments General comments (skin integrity, edema, etc.): bruising and some swelling noted at surgical site. No significant skin deviations noted. Pt O2 sats with poor pleth line remained in mid 80's to Mid 90's on 4 L o2 during gait.        Assessment/Plan    PT Assessment Patient needs continued PT services  PT Problem List Decreased activity tolerance;Decreased mobility;Pain       PT Treatment Interventions DME instruction;Stair training;Therapeutic activities;Balance training;Gait training;Functional mobility training;Therapeutic exercise;Patient/family education    PT Goals (Current goals can be found in the Care Plan section)  Acute Rehab PT Goals Patient Stated Goal: to return home PT Goal Formulation: With patient Time For Goal Achievement: 07/02/23 Potential to Achieve Goals: Good    Frequency Min 1X/week        AM-PAC PT  6 Clicks Mobility  Outcome Measure Help needed turning from your back to your side while in a flat bed without using bedrails?: A Little Help needed moving from lying on your back to sitting on the side of a flat bed without using bedrails?: A Little Help needed moving to and from a bed to a chair (including a wheelchair)?: A Little Help needed standing up from a chair using your arms (e.g., wheelchair or bedside chair)?: A Little Help needed to walk in hospital room?: A Little Help needed climbing 3-5 steps with a railing? : A Little 6 Click Score: 18    End of Session Equipment Utilized During Treatment: Gait belt Activity Tolerance: Patient tolerated treatment well Patient left: in chair;with call bell/phone within reach Nurse Communication: Mobility status PT Visit Diagnosis: Other abnormalities of gait and mobility (R26.89)    Time: 1018-1100 PT Time Calculation (min) (ACUTE ONLY): 42 min   Charges:   PT Evaluation $PT Eval Low Complexity: 1 Low PT Treatments $Therapeutic Activity: 23-37 mins PT General Charges $$ ACUTE PT VISIT: 1 Visit       Dorothyann Maier, DPT, CLT  Acute Rehabilitation Services Office: [phone] (Secure chat preferred)   Dorothyann VEAR Maier 06/18/2023, 11:17 AM

## 2023-06-18 NOTE — Progress Notes (Signed)
 4 Days Post-Op Procedure(s) (LRB): MITRAL VALVE REPAIR USING 32 MM SMUFORM SEMI-RIGID ANNULOPLASTY RING (N/A) TRICUSPID VALVE REPAIR USING 32 MM EDWARDS MC3 TRICUSPID ANNULOPLASTY RING (N/A) CLIPPING OF ATRIAL APPENDAGE USING 50 MM MEDTRONIC PENDITURE LAA CLIP (N/A) TRANSESOPHAGEAL ECHOCARDIOGRAM (N/A) Subjective:  No complaints. Feels better today. Ambulating.  Objective: Vital signs in last 24 hours: Temp:  [97.5 F (36.4 C)-98.5 F (36.9 C)] 98.3 F (36.8 C) (01/12 1600) Pulse Rate:  [68-162] 77 (01/12 1700) Cardiac Rhythm: Atrial fibrillation (01/12 1600) Resp:  [15-28] 21 (01/12 1700) BP: (86-120)/(48-67) 101/53 (01/12 1700) SpO2:  [86 %-100 %] 98 % (01/12 1700) FiO2 (%):  [32 %-45 %] 40 % (01/12 0817) Weight:  [77.3 kg] 77.3 kg (01/12 0554)  Hemodynamic parameters for last 24 hours: CVP:  [4 mmHg-14 mmHg] 4 mmHg  Intake/Output from previous day: 01/11 0701 - 01/12 0700 In: 975.7 [P.O.:840; IV Piggyback:35.7] Out: 5675 [Urine:5675] Intake/Output this shift: Total I/O In: 111.9 [IV Piggyback:111.9] Out: 1725 [Urine:1725]  General appearance: alert and cooperative Neurologic: intact Heart: regular rate and rhythm, S1, S2 normal, no murmur Lungs: clear to auscultation bilaterally Extremities: no edema Wound: incision healing well.  Lab Results: Recent Labs    06/18/23 0457 06/18/23 1559  WBC 8.7 10.4  HGB 9.7* 9.7*  HCT 30.8* 30.4*  PLT 101* 117*   BMET:  Recent Labs    06/18/23 0457 06/18/23 1554  NA 138 136  K 3.3* 3.6  CL 92* 88*  CO2 37* 38*  GLUCOSE 76 116*  BUN 20 20  CREATININE 0.98 1.16*  CALCIUM  9.2 9.1    PT/INR:  Recent Labs    06/18/23 1140  LABPROT 26.3*  INR 2.4*   ABG    Component Value Date/Time   PHART 7.281 (L) 06/16/2023 0329   HCO3 26.2 06/16/2023 0329   TCO2 28 06/16/2023 0329   ACIDBASEDEF 1.0 06/16/2023 0329   O2SAT 93 06/16/2023 0329   CBG (last 3)  Recent Labs    06/18/23 0808 06/18/23 1129 06/18/23 1557   GLUCAP 116* 126* 111*    Assessment/Plan: S/P Procedure(s) (LRB): MITRAL VALVE REPAIR USING 32 MM SMUFORM SEMI-RIGID ANNULOPLASTY RING (N/A) TRICUSPID VALVE REPAIR USING 32 MM EDWARDS MC3 TRICUSPID ANNULOPLASTY RING (N/A) CLIPPING OF ATRIAL APPENDAGE USING 50 MM MEDTRONIC PENDITURE LAA CLIP (N/A) TRANSESOPHAGEAL ECHOCARDIOGRAM (N/A)  POD 4  Hemodynamically stable in sinus rhythm on Lopressor .  -4700 cc yesterday and -1600 cc last shift today. Wt is down to preop. No diuretic today. Replacing K+  Received two doses of Coumadin  5 mg and went up to 6.0 this am. I gave 1 mg vit K and INR down to 2.4 later in the morning. Will give 2.5 mg tonight. INR in am.  DC sleeve and foley.  Continue IS, ambulation.     LOS: 4 days    Rhonda [name] 06/18/2023

## 2023-06-18 NOTE — Progress Notes (Signed)
 06/18/2023 INR up being corrected and warfarin held Good diuresis Electrolytes replaced CVP improved Defer additional diuresis to TCTS Will add CXR for tomorrow am Will see again formally tomorrow  Myrla Halsted MD PCCM

## 2023-06-19 ENCOUNTER — Inpatient Hospital Stay (HOSPITAL_COMMUNITY): Payer: Medicare Other

## 2023-06-19 ENCOUNTER — Other Ambulatory Visit: Payer: Self-pay | Admitting: *Deleted

## 2023-06-19 LAB — PROTIME-INR
INR: 2.7 — ABNORMAL HIGH (ref 0.8–1.2)
Prothrombin Time: 28.6 s — ABNORMAL HIGH (ref 11.4–15.2)

## 2023-06-19 LAB — BASIC METABOLIC PANEL
Anion gap: 10 (ref 5–15)
BUN: 18 mg/dL (ref 8–23)
CO2: 37 mmol/L — ABNORMAL HIGH (ref 22–32)
Calcium: 8.7 mg/dL — ABNORMAL LOW (ref 8.9–10.3)
Chloride: 88 mmol/L — ABNORMAL LOW (ref 98–111)
Creatinine, Ser: 0.8 mg/dL (ref 0.44–1.00)
GFR, Estimated: >60 mL/min (ref >60)
Glucose, Bld: 81 mg/dL (ref 70–99)
Potassium: 3.6 mmol/L (ref 3.5–5.1)
Sodium: 135 mmol/L (ref 135–145)

## 2023-06-19 LAB — CBC
HCT: 27.8 % — ABNORMAL LOW (ref 36.0–46.0)
Hemoglobin: 9 g/dL — ABNORMAL LOW (ref 12.0–15.0)
MCH: 30.3 pg (ref 26.0–34.0)
MCHC: 32.4 g/dL (ref 30.0–36.0)
MCV: 93.6 fL (ref 80.0–100.0)
Platelets: 114 10*3/uL — ABNORMAL LOW (ref 150–400)
RBC: 2.97 MIL/uL — ABNORMAL LOW (ref 3.87–5.11)
RDW: 14.8 % (ref 11.5–15.5)
WBC: 7.3 10*3/uL (ref 4.0–10.5)
nRBC: 0 % (ref 0.0–0.2)

## 2023-06-19 LAB — GLUCOSE, CAPILLARY
Glucose-Capillary: 85 mg/dL (ref 70–99)
Glucose-Capillary: 110 mg/dL — ABNORMAL HIGH (ref 70–99)
Glucose-Capillary: 169 mg/dL — ABNORMAL HIGH (ref 70–99)

## 2023-06-19 MED ORDER — ~~LOC~~ CARDIAC SURGERY, PATIENT & FAMILY EDUCATION: Freq: Once | Status: AC

## 2023-06-19 MED ORDER — WARFARIN SODIUM 1 MG PO TABS
1.0000 mg | ORAL_TABLET | Freq: Once | ORAL | Status: AC
  Administered 2023-06-19: 1 mg via ORAL
  Filled 2023-06-19 (×2): qty 1

## 2023-06-19 MED ORDER — SODIUM CHLORIDE 0.9% FLUSH
3.0000 mL | INTRAVENOUS | Status: DC | PRN
Start: 2023-06-19 — End: 2023-06-19

## 2023-06-19 MED ORDER — DIGOXIN 125 MCG PO TABS
0.1250 mg | ORAL_TABLET | Freq: Every day | ORAL | Status: DC
  Administered 2023-06-19 – 2023-06-21 (×3): 0.125 mg via ORAL
  Filled 2023-06-19 (×3): qty 1

## 2023-06-19 MED ORDER — POTASSIUM CHLORIDE CRYS ER 20 MEQ PO TBCR
20.0000 meq | EXTENDED_RELEASE_TABLET | ORAL | Status: AC
  Administered 2023-06-19 (×3): 20 meq via ORAL
  Filled 2023-06-19 (×2): qty 1

## 2023-06-19 MED ORDER — SODIUM CHLORIDE 0.9% FLUSH: 3.0000 mL | Freq: Two times a day (BID) | INTRAVENOUS | Status: DC

## 2023-06-19 MED ORDER — FUROSEMIDE 10 MG/ML IJ SOLN
40.0000 mg | Freq: Two times a day (BID) | INTRAMUSCULAR | Status: DC
  Administered 2023-06-19 (×2): 40 mg via INTRAVENOUS
  Filled 2023-06-19 (×2): qty 4

## 2023-06-19 MED ORDER — LACTULOSE 10 GM/15ML PO SOLN: 20.0000 g | Freq: Every day | ORAL | Status: DC | PRN

## 2023-06-19 MED ORDER — SODIUM CHLORIDE 0.9 % IV SOLN
250.0000 mL | INTRAVENOUS | Status: DC | PRN
Start: 2023-06-19 — End: 2023-06-19

## 2023-06-19 MED ORDER — ATORVASTATIN CALCIUM 10 MG PO TABS
20.0000 mg | ORAL_TABLET | Freq: Every day | ORAL | Status: DC
  Administered 2023-06-20 – 2023-06-21 (×2): 20 mg via ORAL
  Filled 2023-06-19 (×2): qty 2

## 2023-06-19 NOTE — Progress Notes (Signed)
 Pt states used CPAP years ago. Has lost weight & doesn't know settings. Set up on AutoBiPAP max insp 12 min 9 based on RR, effort and BS. Will monitor for hypopnea and add PS if necessary.   06/19/23 0002  BiPAP/CPAP/SIPAP  $ Non-Invasive Home Ventilator  Initial  BiPAP/CPAP/SIPAP Pt Type Adult  BiPAP/CPAP/SIPAP Resmed  Mask Type Full face mask  Mask Size Medium  IPAP  (max 12)  EPAP  (min 9)  Flow Rate 4 lpm

## 2023-06-19 NOTE — Progress Notes (Signed)
 [address]       [place] [address]             [phone]      5 Days Post-Op Procedure(s) (LRB): MITRAL VALVE REPAIR USING 32 MM SMUFORM SEMI-RIGID ANNULOPLASTY RING (N/A) TRICUSPID VALVE REPAIR USING 32 MM EDWARDS MC3 TRICUSPID ANNULOPLASTY RING (N/A) CLIPPING OF ATRIAL APPENDAGE USING 50 MM MEDTRONIC PENDITURE LAA CLIP (N/A) TRANSESOPHAGEAL ECHOCARDIOGRAM (N/A) Subjective: The patient admits to bladder discomfort this AM but this resolved once external catheter was placed. Denies urinary burning or pain now. Admits that she feels she is breathing better.  Objective: Vital signs in last 24 hours: Temp:  [97.5 F (36.4 C)-98.4 F (36.9 C)] 98 F (36.7 C) (01/13 0650) Pulse Rate:  [77-162] 84 (01/13 0700) Cardiac Rhythm: Atrial fibrillation (01/12 2000) Resp:  [15-31] 31 (01/13 0700) BP: (91-120)/(50-70) 104/62 (01/13 0700) SpO2:  [86 %-99 %] 93 % (01/13 0700) FiO2 (%):  [40 %] 40 % (01/12 0817) Weight:  [76.3 kg] 76.3 kg (01/13 0415)  Hemodynamic parameters for last 24 hours: CVP:  [4 mmHg-14 mmHg] 4 mmHg  Intake/Output from previous day: 01/12 0701 - 01/13 0700 In: 1071.9 [P.O.:960; IV Piggyback:111.9] Out: 2900 [Urine:2900] Intake/Output this shift: No intake/output data recorded.  General appearance: alert, cooperative, and no distress Neurologic: intact Heart: irregularly irregular rhythm Lungs: diminished bibasilar breath sounds Abdomen: soft, non-tender; bowel sounds normal; no masses,  no organomegaly Extremities: edema 1+ Wound: Clean and dry without sign of infection  Lab Results: Recent Labs    06/18/23 1559 06/19/23 0225  WBC 10.4 7.3  HGB 9.7* 9.0*  HCT 30.4* 27.8*  PLT 117* 114*   BMET:  Recent Labs    06/18/23 1554 06/19/23 0225  NA 136 135  K 3.6 3.6  CL 88* 88*  CO2 38* 37*  GLUCOSE 116* 81  BUN 20 18  CREATININE 1.16* 0.80  CALCIUM  9.1 8.7*    PT/INR:  Recent Labs    06/19/23 0225  LABPROT 28.6*   INR 2.7*   ABG    Component Value Date/Time   PHART 7.281 (L) 06/16/2023 0329   HCO3 26.2 06/16/2023 0329   TCO2 28 06/16/2023 0329   ACIDBASEDEF 1.0 06/16/2023 0329   O2SAT 93 06/16/2023 0329   CBG (last 3)  Recent Labs    06/18/23 1129 06/18/23 1557 06/19/23 0651  GLUCAP 126* 111* 85    Assessment/Plan: S/P Procedure(s) (LRB): MITRAL VALVE REPAIR USING 32 MM SMUFORM SEMI-RIGID ANNULOPLASTY RING (N/A) TRICUSPID VALVE REPAIR USING 32 MM EDWARDS MC3 TRICUSPID ANNULOPLASTY RING (N/A) CLIPPING OF ATRIAL APPENDAGE USING 50 MM MEDTRONIC PENDITURE LAA CLIP (N/A) TRANSESOPHAGEAL ECHOCARDIOGRAM (N/A)  CV: On Lopressor  12.5mg  BID. Was on Eliquis , digoxin  and cardizem  at home for afib. Looks like afib, HR 90s-110. SBP 104 this AM. Hx of postop afib/junctional rhythm. Will restart home Digoxin  as discussed with Dr. Maryjane.  Pulm: Saturating 95% on 3L [place]. CXR with bilateral pleural effusions and pulmonary congestion. Patient using BIPAP at night per respiratory therapy for hypopnea. Patient reports she used CPAP years ago. Will continue BIPAP/CPAP and patient will need outpatient follow up for sleep study. Continue nebs and mucinex . Encourage IS and ambulation.   GI: +BM, some loose stool yesterday. Will d/c daily lactulose . Tolerating a diet  Endo: Preop A1C 6.2. CBGs controlled on SSI. No home DM meds.   Renal: Cr 0.8, down from 1.16. UO 2900cc/24hrs. Under preop weight. IV Lasix  40mg  today per Dr.  Weldner. K 3.6, supplement.  Reactive thrombocytopenia: Plt 114,000, stable. Will monitor.  INR: INR 2.7 this AM from 2.4 yesterday. Pharmacy managing Coumadin . Received 2.5mg  yesterday, giving 1mg  Coumadin  today.  Deconditioning: No follow up recs per PT  Dispo: Transfer to 4E as discussed with Dr. Maryjane   LOS: 5 days    Con JAYSON Helm, PA-C 06/19/2023

## 2023-06-19 NOTE — Progress Notes (Signed)
 Physical Therapy Treatment Patient Details Name: Rhonda [name] MRN: [id number] DOB: 1955-06-26 Today's Date: 06/19/2023   History of Present Illness Pt is 68 yo presenting to [facility] on 1/8 for elective MVR/TVR. PMH: severe MR and TR, diastolic CHF, alcoholic cirrhosis w/mild varices. Afib on eliquis , HTN, CAD s/p RCA stent 2011, OSA.    PT Comments  Pt making excellent progress with mobility. Amb with O2 at 3L but unable to obtain accurate SpO2 reading. Used a rollator today which pt liked and she reports her roommate has a rollator that she doesn't use so pt confident she can use that rollator at home. Expect continued good progress and do not feel pt will need follow up PT at dc.     If plan is discharge home, recommend the following: Assist for transportation;Help with stairs or ramp for entrance;Assistance with cooking/housework   Can travel by private vehicle        Equipment Recommendations  None recommended by PT (Pt can borrow a rollator from roommate who has one not being used.)    Recommendations for Other Services       Precautions / Restrictions Precautions Precautions: Sternal Restrictions Weight Bearing Restrictions Per Provider Order: Yes (sternal precautions) RUE Weight Bearing Per Provider Order: Non weight bearing LUE Weight Bearing Per Provider Order: Non weight bearing Other Position/Activity Restrictions: sternal precautions     Mobility  Bed Mobility               General bed mobility comments: Pt up in chair    Transfers Overall transfer level: Needs assistance Equipment used: None, Rollator (4 wheels) Transfers: Sit to/from Stand Sit to Stand: Supervision           General transfer comment: Supervision and occasional cues for hand placement    Ambulation/Gait Ambulation/Gait assistance: Supervision Gait Distance (Feet): 470 Feet Assistive device: Rollator (4 wheels) Gait Pattern/deviations: Step-through pattern, Decreased  stride length Gait velocity: decreased Gait velocity interpretation: 1.31 - 2.62 ft/sec, indicative of limited community ambulator   General Gait Details: Supervision for safety and lines.   Stairs             Wheelchair Mobility     Tilt Bed    Modified Rankin (Stroke Patients Only)       Balance Overall balance assessment: Mild deficits observed, not formally tested                                          Cognition Arousal: Alert Behavior During Therapy: WFL for tasks assessed/performed Overall Cognitive Status: Within Functional Limits for tasks assessed                                          Exercises      General Comments        Pertinent Vitals/Pain Pain Assessment Pain Assessment: No/denies pain    Home Living                          Prior Function            PT Goals (current goals can now be found in the care plan section) Acute Rehab PT Goals Patient Stated Goal: to return home Progress towards PT goals: Progressing toward  goals    Frequency    Min 1X/week      PT Plan      Co-evaluation              AM-PAC PT 6 Clicks Mobility   Outcome Measure  Help needed turning from your back to your side while in a flat bed without using bedrails?: A Little Help needed moving from lying on your back to sitting on the side of a flat bed without using bedrails?: A Little Help needed moving to and from a bed to a chair (including a wheelchair)?: A Little Help needed standing up from a chair using your arms (e.g., wheelchair or bedside chair)?: A Little Help needed to walk in hospital room?: A Little Help needed climbing 3-5 steps with a railing? : A Little 6 Click Score: 18    End of Session Equipment Utilized During Treatment: Oxygen Activity Tolerance: Patient tolerated treatment well Patient left: in chair;with call bell/phone within reach Nurse Communication: Mobility  status PT Visit Diagnosis: Other abnormalities of gait and mobility (R26.89)     Time: 8949-8880 PT Time Calculation (min) (ACUTE ONLY): 29 min  Charges:    $Gait Training: 23-37 mins PT General Charges $$ ACUTE PT VISIT: 1 Visit                     [facility] PT Acute Rehabilitation Services Office [phone]    [place] ORN [facility] 06/19/2023, 11:38 AM

## 2023-06-19 NOTE — Plan of Care (Signed)
   Problem: Education: Goal: Knowledge of General Education information will improve Description Including pain rating scale, medication(s)/side effects and non-pharmacologic comfort measures Outcome: Progressing

## 2023-06-19 NOTE — TOC Initial Note (Signed)
 Transition of Care [facility]) - Initial/Assessment Note    Patient Details  Name: Rhonda [name] MRN: [id number] Date of Birth: [birth year]  Transition of Care [facility]) CM/SW Contact:    Justina Delcia Czar, RN Phone Number: [phone] 06/19/2023, 1:35 PM  Clinical Narrative:                 CM spoke to pt at bedside. States her dtr will be here from [place]  to assist her in the home for week. She has a dtr locally that will be able to assist. Pt states she has a RW at home. States neighbor or dtr will provide transportation home.  [employer] can accept for [facility]. Medicare.gov list provided to pt and placed on chart.   Expected Discharge Plan: Home w Home Health Services Barriers to Discharge: Continued Medical Work up   Patient Goals and CMS Choice Patient states their goals for this hospitalization and ongoing recovery are:: wants to recover          Expected Discharge Plan and Services       Living arrangements for the past 2 months: Single Family Home                                      Prior Living Arrangements/Services Living arrangements for the past 2 months: Single Family Home Lives with:: Roommate Patient language and need for interpreter reviewed:: Yes Do you feel safe going back to the place where you live?: Yes      Need for Family Participation in Patient Care: Yes (Comment) Care giver support system in place?: Yes (comment) Current home services: DME (rolling walker) Criminal Activity/Legal Involvement Pertinent to Current Situation/Hospitalization: No - Comment as needed  Activities of Daily Living   ADL Screening (condition at time of admission) Independently performs ADLs?: Yes (appropriate for developmental age) Is the patient deaf or have difficulty hearing?: No Does the patient have difficulty seeing, even when wearing glasses/contacts?: No Does the patient have difficulty concentrating, remembering, or making decisions?:  No  Permission Sought/Granted Permission sought to share information with : Case Manager, Family Supports, PCP Permission granted to share information with : Yes, Verbal Permission Granted  Share Information with NAME: Faith Milan     Permission granted to share info w Relationship: daughter  Permission granted to share info w Contact Information: [phone]  Emotional Assessment Appearance:: Appears stated age Attitude/Demeanor/Rapport: Engaged Affect (typically observed): Accepting Orientation: : Oriented to Self, Oriented to Place, Oriented to  Time, Oriented to Situation   Psych Involvement: No (comment)  Admission diagnosis:  S/P MVR (mitral valve repair) [Z98.890] Patient Active Problem List   Diagnosis Date Noted   S/P MVR (mitral valve repair) 06/14/2023   Nonrheumatic mitral valve regurgitation 03/08/2023   Nonrheumatic tricuspid valve regurgitation 03/08/2023   Alcoholic cirrhosis of liver with ascites (HCC)    Acute exacerbation of CHF (congestive heart failure) (HCC) 06/16/2020   Volume overload 06/16/2020   Pneumonia 06/20/2018   Postmenopausal bleeding 05/29/2018   Acute on chronic diastolic CHF (congestive heart failure) (HCC)    Acute on chronic congestive heart failure (HCC) 02/03/2016   CHF (congestive heart failure), NYHA class I (HCC) 02/03/2016   Essential hypertension 02/03/2016   Atrial fibrillation (HCC) 02/03/2016   Atrial flutter (HCC)    PCP:  Delores Delorise Lunger, FNP Pharmacy:   [facility] [phone] - [place], [place] - [address]  [address] [place] [address] Phone: [phone] Fax: [phone]     Social Drivers of Health (SDOH) Social History: SDOH Screenings   Food Insecurity: No Food Insecurity (06/14/2023)  Housing: Low Risk  (06/14/2023)  Transportation Needs: No Transportation Needs (06/14/2023)  Utilities: Not At Risk (06/14/2023)  Financial Resource Strain: Not on File (09/23/2021)   Received from [place], [place]   Physical Activity: Not on File (09/23/2021)   Received from [place], [place]  Social Connections: Moderately Integrated (06/14/2023)  Stress: Not on File (09/23/2021)   Received from [facility], [place]  Tobacco Use: Medium Risk (06/14/2023)   SDOH Interventions: Housing Interventions: Intervention Not Indicated   Readmission Risk Interventions     No data to display

## 2023-06-19 NOTE — Discharge Instructions (Addendum)

## 2023-06-19 NOTE — Progress Notes (Signed)
 Patient transferred from [facility] to 4E.  Patient able to ambulate with standby assist.  VSS.  CCMD notified.    06/19/23 1508  Vitals  Temp 97.9 F (36.6 C)  Temp Source Oral  BP 105/65  MAP (mmHg) 78  BP Location Left Arm  BP Method Automatic  Patient Position (if appropriate) Sitting  Pulse Rate 80  Pulse Rate Source Monitor  Resp 18  Level of Consciousness  Level of Consciousness Alert  MEWS COLOR  MEWS Score Color Green  Oxygen Therapy  SpO2 97 %  O2 Device Nasal Cannula  O2 Flow Rate (L/min) 3 L/min  Pain Assessment  Pain Scale 0-10  Pain Score 0  MEWS Score  MEWS Temp 0  MEWS Systolic 0  MEWS Pulse 0  MEWS RR 0  MEWS LOC 0  MEWS Score 0

## 2023-06-19 NOTE — Progress Notes (Signed)
 PHARMACY - ANTICOAGULATION CONSULT NOTE  Pharmacy Consult for warfarin Indication:  bioMVR/Afib  Allergies  Allergen Reactions   Ace Inhibitors Cough   Prednisone  Itching and Swelling    Itching on leg and arms swelling from knee to ankles and felt restless    Patient Measurements: Height: 5' 2 (157.5 cm) Weight: 76.3 kg (168 lb 3.4 oz) IBW/kg (Calculated) : 50.1 Heparin  Dosing Weight: 67 kg   Vital Signs: Temp: 98 F (36.7 C) (01/13 0650) Temp Source: Oral (01/13 0650) BP: 104/62 (01/13 0700) Pulse Rate: 84 (01/13 0700)  Labs: Recent Labs    06/18/23 0457 06/18/23 1140 06/18/23 1554 06/18/23 1559 06/19/23 0225  HGB 9.7*  --   --  9.7* 9.0*  HCT 30.8*  --   --  30.4* 27.8*  PLT 101*  --   --  117* 114*  LABPROT 53.8* 26.3*  --   --  28.6*  INR 6.0* 2.4*  --   --  2.7*  CREATININE 0.98  --  1.16*  --  0.80    Estimated Creatinine Clearance: 67.1 mL/min (by C-G formula based on SCr of 0.8 mg/dL).   Medical History: Past Medical History:  Diagnosis Date   Allergy    Atrial fibrillation (HCC)    CAD (coronary artery disease) 08/2009   BMS to RCA, non-obstructive in CX and LAD 2011   Cataract    CHF (congestive heart failure) (HCC)    Cirrhosis (HCC)    Dysrhythmia    A. Fib   Edema, peripheral    Heart murmur    Hypertension    Myocardial infarction [facility])    Nausea    Pneumonia    January 2020   Sleep apnea    No CPAP   Trichomoniasis 06/05/2018    Medications:  Scheduled:   acetaminophen   1,000 mg Oral Q6H   Or   acetaminophen  (TYLENOL ) oral liquid 160 mg/5 mL  1,000 mg Per Tube Q6H   arformoterol   15 mcg Nebulization BID   atorvastatin   20 mg Per Tube Daily   bisacodyl   10 mg Oral Daily   Or   bisacodyl   10 mg Rectal Daily   Chlorhexidine  Gluconate Cloth  6 each Topical Daily   dextromethorphan -guaiFENesin   1 tablet Oral BID   docusate sodium   200 mg Oral Daily   folic acid   1 mg Oral Daily   furosemide   40 mg Intravenous BID   insulin   aspart  0-24 Units Subcutaneous TID WC   lactulose   20 g Oral Daily   metoprolol  tartrate  12.5 mg Oral BID   Or   metoprolol  tartrate  12.5 mg Per Tube BID   multivitamins with iron   1 tablet Oral Daily   mouth rinse  15 mL Mouth Rinse 4 times per day   pneumococcal 20-valent conjugate vaccine  0.5 mL Intramuscular Tomorrow-1000   potassium chloride   20 mEq Oral Q4H   revefenacin   175 mcg Nebulization Daily   sodium chloride  flush  3 mL Intravenous Q12H   thiamine   100 mg Oral Daily   Warfarin - Physician Dosing Inpatient   Does not apply q1600    Assessment: 68 yof presenting for elective MVR/TVR. On apixaban  PTA for Afib (LD 1/3 AM).  INR this weekend increasing from 1.4 to 1.7, where warfarin dose was held but INR still increased to 6. Vitamin K  1 mg IV was given and INR went down to 2.4 and received 2.5 mg last night. INR today trending up  to 2.7. Hgb 9, plt 114. No s/sx of bleeding noted. Appetite slowly improving - ate all breakfast today and eating at least 50% of most meals per patient report.   Goal of Therapy:  INR 2-3 Monitor platelets by anticoagulation protocol: Yes   Plan:  Give warfarin 1 mg tonight - discussed with CTVS  Monitor CBC, s/sx of bleeding   Thank you for allowing pharmacy to participate in this patient's care,  Suzen Sour, PharmD, BCCCP Clinical Pharmacist  Phone: [phone] 06/19/2023 7:30 AM  Please check AMION for all [facility] Pharmacy phone numbers After 10:00 PM, call Main Pharmacy [phone]

## 2023-06-20 ENCOUNTER — Inpatient Hospital Stay (HOSPITAL_COMMUNITY): Payer: Medicare Other

## 2023-06-20 LAB — CBC
HCT: 27.5 % — ABNORMAL LOW (ref 36.0–46.0)
Hemoglobin: 8.9 g/dL — ABNORMAL LOW (ref 12.0–15.0)
MCH: 30 pg (ref 26.0–34.0)
MCHC: 32.4 g/dL (ref 30.0–36.0)
MCV: 92.6 fL (ref 80.0–100.0)
Platelets: 136 10*3/uL — ABNORMAL LOW (ref 150–400)
RBC: 2.97 MIL/uL — ABNORMAL LOW (ref 3.87–5.11)
RDW: 14.6 % (ref 11.5–15.5)
WBC: 7.6 10*3/uL (ref 4.0–10.5)
nRBC: 0 % (ref 0.0–0.2)

## 2023-06-20 LAB — BASIC METABOLIC PANEL
Anion gap: 10 (ref 5–15)
BUN: 13 mg/dL (ref 8–23)
CO2: 42 mmol/L — ABNORMAL HIGH (ref 22–32)
Calcium: 8.7 mg/dL — ABNORMAL LOW (ref 8.9–10.3)
Chloride: 84 mmol/L — ABNORMAL LOW (ref 98–111)
Creatinine, Ser: 0.63 mg/dL (ref 0.44–1.00)
GFR, Estimated: >60 mL/min (ref >60)
Glucose, Bld: 102 mg/dL — ABNORMAL HIGH (ref 70–99)
Potassium: 3.2 mmol/L — ABNORMAL LOW (ref 3.5–5.1)
Sodium: 136 mmol/L (ref 135–145)

## 2023-06-20 LAB — PROTIME-INR
INR: 4.7 (ref 0.8–1.2)
Prothrombin Time: 44.6 s — ABNORMAL HIGH (ref 11.4–15.2)

## 2023-06-20 LAB — GLUCOSE, CAPILLARY
Glucose-Capillary: 104 mg/dL — ABNORMAL HIGH (ref 70–99)
Glucose-Capillary: 106 mg/dL — ABNORMAL HIGH (ref 70–99)
Glucose-Capillary: 101 mg/dL — ABNORMAL HIGH (ref 70–99)

## 2023-06-20 MED ORDER — POTASSIUM CHLORIDE CRYS ER 20 MEQ PO TBCR
20.0000 meq | EXTENDED_RELEASE_TABLET | ORAL | Status: AC
Start: 2023-06-20 — End: 2023-06-20
  Administered 2023-06-20 (×3): 20 meq via ORAL
  Filled 2023-06-20 (×3): qty 1

## 2023-06-20 MED ORDER — FUROSEMIDE 10 MG/ML IJ SOLN
40.0000 mg | Freq: Every day | INTRAMUSCULAR | Status: DC
Start: 2023-06-20 — End: 2023-06-21
  Administered 2023-06-20 – 2023-06-21 (×2): 40 mg via INTRAVENOUS
  Filled 2023-06-20 (×2): qty 4

## 2023-06-20 MED ORDER — METOPROLOL TARTRATE 25 MG PO TABS
25.0000 mg | ORAL_TABLET | Freq: Two times a day (BID) | ORAL | Status: DC
  Administered 2023-06-20 – 2023-06-21 (×3): 25 mg via ORAL
  Filled 2023-06-20 (×3): qty 1

## 2023-06-20 MED ORDER — METOPROLOL TARTRATE 25 MG/10 ML ORAL SUSPENSION
12.5000 mg | Freq: Two times a day (BID) | ORAL | Status: DC
  Filled 2023-06-20 (×4): qty 5

## 2023-06-20 NOTE — Progress Notes (Signed)
 CARDIAC REHAB PHASE I   PRE:  Rate/Rhythm: 80 A-fib   BP:  Sitting: 104/65      SaO2: 92 2L [place]   MODE:  Ambulation: 470 ft   POST:  Rate/Rhythm: 100 a-fib   BP:  Sitting: 103/68      SaO2: 94 2L/[place]   Pt ambulated in hallway. Tolerating well with no CP, SOB or dizziness. Returned to bed with call bell and bedside table in reach.  Post OHS education including site care, restrictions, heart healthy diet, sternal precautions, IS use at home, home needs at discharge, exercise guidelines, smoking cessation and CRP2 reviewed. All questions and concerns addressed. Will refer to [facility] for CRP2. Will continue to follow.   [id number] Vaughn Asberry Hacking, RN BSN 06/20/2023 9:59 AM

## 2023-06-20 NOTE — Progress Notes (Signed)
   06/20/23 1949  BiPAP/CPAP/SIPAP  Reason BIPAP/CPAP not in use Non-compliant   Pt refused

## 2023-06-20 NOTE — Progress Notes (Signed)
 6 Days Post-Op Procedure(s) (LRB): MITRAL VALVE REPAIR USING 32 MM SMUFORM SEMI-RIGID ANNULOPLASTY RING (N/A) TRICUSPID VALVE REPAIR USING 32 MM EDWARDS MC3 TRICUSPID ANNULOPLASTY RING (N/A) CLIPPING OF ATRIAL APPENDAGE USING 50 MM MEDTRONIC PENDITURE LAA CLIP (N/A) TRANSESOPHAGEAL ECHOCARDIOGRAM (N/A) Subjective: Feels some soreness, mostly feeling ok  Objective: Vital signs in last 24 hours: Temp:  [97.5 F (36.4 C)-97.9 F (36.6 C)] 97.9 F (36.6 C) (01/14 0410) Pulse Rate:  [78-108] 87 (01/14 0606) Cardiac Rhythm: Atrial fibrillation (01/13 1906) Resp:  [17-27] 17 (01/14 0606) BP: (101-121)/(51-76) 113/62 (01/14 0410) SpO2:  [90 %-100 %] 97 % (01/14 0606) Weight:  [76.1 kg] 76.1 kg (01/14 0606)  Hemodynamic parameters for last 24 hours:    Intake/Output from previous day: 01/13 0701 - 01/14 0700 In: -  Out: 1400 [Urine:1400] Intake/Output this shift: No intake/output data recorded.  General appearance: alert, cooperative, and no distress Heart: irregularly irregular rhythm Lungs: diminished BS in bases Abdomen: benign Extremities: minimal edema Wound: incis healing well  Lab Results: Recent Labs    06/19/23 0225 06/20/23 0256  WBC 7.3 7.6  HGB 9.0* 8.9*  HCT 27.8* 27.5*  PLT 114* 136*   BMET:  Recent Labs    06/19/23 0225 06/20/23 0256  NA 135 136  K 3.6 3.2*  CL 88* 84*  CO2 37* 42*  GLUCOSE 81 102*  BUN 18 13  CREATININE 0.80 0.63  CALCIUM  8.7* 8.7*    PT/INR:  Recent Labs    06/20/23 0256  LABPROT 44.6*  INR 4.7*   ABG    Component Value Date/Time   PHART 7.281 (L) 06/16/2023 0329   HCO3 26.2 06/16/2023 0329   TCO2 28 06/16/2023 0329   ACIDBASEDEF 1.0 06/16/2023 0329   O2SAT 93 06/16/2023 0329   CBG (last 3)  Recent Labs    06/19/23 1141 06/19/23 1650 06/20/23 0653  GLUCAP 110* 169* 104*    Meds Scheduled Meds:  arformoterol   15 mcg Nebulization BID   atorvastatin   20 mg Oral Daily   bisacodyl   10 mg Oral Daily   Or    bisacodyl   10 mg Rectal Daily   Chlorhexidine  Gluconate Cloth  6 each Topical Daily   dextromethorphan -guaiFENesin   1 tablet Oral BID   digoxin   0.125 mg Oral Daily   docusate sodium   200 mg Oral Daily   folic acid   1 mg Oral Daily   furosemide   40 mg Intravenous BID   insulin  aspart  0-24 Units Subcutaneous TID WC   metoprolol  tartrate  12.5 mg Oral BID   Or   metoprolol  tartrate  12.5 mg Per Tube BID   multivitamins with iron   1 tablet Oral Daily   mouth rinse  15 mL Mouth Rinse 4 times per day   pneumococcal 20-valent conjugate vaccine  0.5 mL Intramuscular Tomorrow-1000   revefenacin   175 mcg Nebulization Daily   sodium chloride  flush  3 mL Intravenous Q12H   thiamine   100 mg Oral Daily   Warfarin - Physician Dosing Inpatient   Does not apply q1600   Continuous Infusions: PRN Meds:.hydrOXYzine , ipratropium-albuterol , metoprolol  tartrate, morphine  injection, ondansetron  (ZOFRAN ) IV, mouth rinse, oxyCODONE , sodium chloride  flush, traMADol   Xrays DG Chest Port 1 View Result Date: 06/19/2023 CLINICAL DATA:  Pulmonary edema. EXAM: PORTABLE CHEST 1 VIEW COMPARISON:  Chest radiograph dated 06/17/2023. FINDINGS: There is cardiomegaly with vascular congestion and edema. Small bilateral pleural effusions and bibasilar atelectasis or infiltrate overall no significant interval change since the prior radiograph. Median sternotomy wires  and aortic valve repair. No acute osseous pathology. IMPRESSION: Cardiomegaly with vascular congestion and edema. Electronically Signed   By: Vanetta Chou M.D.   On: 06/19/2023 09:36    Assessment/Plan: S/P Procedure(s) (LRB): MITRAL VALVE REPAIR USING 32 MM SMUFORM SEMI-RIGID ANNULOPLASTY RING (N/A) TRICUSPID VALVE REPAIR USING 32 MM EDWARDS MC3 TRICUSPID ANNULOPLASTY RING (N/A) CLIPPING OF ATRIAL APPENDAGE USING 50 MM MEDTRONIC PENDITURE LAA CLIP (N/A) TRANSESOPHAGEAL ECHOCARDIOGRAM (N/A) POD#6  1 afeb, VSS, afib- chronic- back on digoxon, tachy at  times, will increase beta blocker,  on coumadin  2 O2 sats ok on 3 liters 3 weight below preop, voiding well- unmeasured, will reduce lasix  to daily, and likely stop soon 4 BS controlled well, not a diabetic 5 K+ 3.2 replace 7 normal renal fxn 8 H/H stable c/w yesterday 9 platelet count trending towards normal, 136 K today 10 CXR bibasilar ASD, small effus- improving 11 INR 4.7, no coumadin  today 12 push pulm hygiene and rehab, likely home 1-2 days     LOS: 6 days    Lemond FORBES Cera PA-C Pager [phone] 06/20/2023

## 2023-06-20 NOTE — Progress Notes (Signed)
 PHARMACY - ANTICOAGULATION CONSULT NOTE  Pharmacy Consult for warfarin Indication:  bioMVR/Afib  Allergies  Allergen Reactions   Ace Inhibitors Cough   Prednisone  Itching and Swelling    Itching on leg and arms swelling from knee to ankles and felt restless    Patient Measurements: Height: 5' 2 (157.5 cm) Weight: 76.1 kg (167 lb 12.3 oz) IBW/kg (Calculated) : 50.1 Heparin  Dosing Weight: 67 kg   Vital Signs: Temp: 98.4 F (36.9 C) (01/14 0737) Temp Source: Oral (01/14 0737) BP: 104/65 (01/14 0822) Pulse Rate: 100 (01/14 0822)  Labs: Recent Labs    06/18/23 1140 06/18/23 1554 06/18/23 1559 06/19/23 0225 06/20/23 0256  HGB  --   --  9.7* 9.0* 8.9*  HCT  --   --  30.4* 27.8* 27.5*  PLT  --   --  117* 114* 136*  LABPROT 26.3*  --   --  28.6* 44.6*  INR 2.4*  --   --  2.7* 4.7*  CREATININE  --  1.16*  --  0.80 0.63    Estimated Creatinine Clearance: 67 mL/min (by C-G formula based on SCr of 0.63 mg/dL).   Medical History: Past Medical History:  Diagnosis Date   Allergy    Atrial fibrillation (HCC)    CAD (coronary artery disease) 08/2009   BMS to RCA, non-obstructive in CX and LAD 2011   Cataract    CHF (congestive heart failure) (HCC)    Cirrhosis (HCC)    Dysrhythmia    A. Fib   Edema, peripheral    Heart murmur    Hypertension    Myocardial infarction [facility])    Nausea    Pneumonia    January 2020   Sleep apnea    No CPAP   Trichomoniasis 06/05/2018    Medications:  Scheduled:   arformoterol   15 mcg Nebulization BID   atorvastatin   20 mg Oral Daily   bisacodyl   10 mg Oral Daily   Or   bisacodyl   10 mg Rectal Daily   Chlorhexidine  Gluconate Cloth  6 each Topical Daily   dextromethorphan -guaiFENesin   1 tablet Oral BID   digoxin   0.125 mg Oral Daily   docusate sodium   200 mg Oral Daily   folic acid   1 mg Oral Daily   furosemide   40 mg Intravenous Daily   insulin  aspart  0-24 Units Subcutaneous TID WC   metoprolol  tartrate  25 mg Oral BID    Or   metoprolol  tartrate  12.5 mg Per Tube BID   multivitamins with iron   1 tablet Oral Daily   mouth rinse  15 mL Mouth Rinse 4 times per day   pneumococcal 20-valent conjugate vaccine  0.5 mL Intramuscular Tomorrow-1000   potassium chloride   20 mEq Oral Q4H   revefenacin   175 mcg Nebulization Daily   sodium chloride  flush  3 mL Intravenous Q12H   thiamine   100 mg Oral Daily   Warfarin - Physician Dosing Inpatient   Does not apply q1600    Assessment: 56 yof presenting for elective MVR/TVR. On apixaban  PTA for Afib (LD 1/3 AM). Pharmacy dosing warfarin (started 1/10)  -INR 2.7> 4.7. Hg= 8.9, plt= 136 -Of note, INR went up 1.7> 6.0 from 1/11 to 1/12 and vitamin K  1mg  po given -No major drug interactions noted  Goal of Therapy:  INR 2-3 Monitor platelets by anticoagulation protocol: Yes   Plan:  -Hold warfarin tonight -LFTs in am  Thank you for allowing pharmacy to participate in this patient's care,  Prentice Poisson, PharmD Clinical Pharmacist **Pharmacist phone directory can now be found on [email or web address] ([email or web address]).  Listed under [facility].

## 2023-06-21 ENCOUNTER — Other Ambulatory Visit (HOSPITAL_COMMUNITY): Payer: Self-pay

## 2023-06-21 ENCOUNTER — Other Ambulatory Visit: Payer: Self-pay | Admitting: Cardiology

## 2023-06-21 DIAGNOSIS — Z9889 Other specified postprocedural states: Secondary | ICD-10-CM

## 2023-06-21 LAB — BASIC METABOLIC PANEL
Anion gap: 9 (ref 5–15)
BUN: 11 mg/dL (ref 8–23)
CO2: 41 mmol/L — ABNORMAL HIGH (ref 22–32)
Calcium: 9.1 mg/dL (ref 8.9–10.3)
Chloride: 84 mmol/L — ABNORMAL LOW (ref 98–111)
Creatinine, Ser: 0.59 mg/dL (ref 0.44–1.00)
GFR, Estimated: >60 mL/min (ref >60)
Glucose, Bld: 82 mg/dL (ref 70–99)
Potassium: 3.7 mmol/L (ref 3.5–5.1)
Sodium: 134 mmol/L — ABNORMAL LOW (ref 135–145)

## 2023-06-21 LAB — CBC
HCT: 29.9 % — ABNORMAL LOW (ref 36.0–46.0)
Hemoglobin: 9.7 g/dL — ABNORMAL LOW (ref 12.0–15.0)
MCH: 30.3 pg (ref 26.0–34.0)
MCHC: 32.4 g/dL (ref 30.0–36.0)
MCV: 93.4 fL (ref 80.0–100.0)
Platelets: 191 10*3/uL (ref 150–400)
RBC: 3.2 MIL/uL — ABNORMAL LOW (ref 3.87–5.11)
RDW: 14.6 % (ref 11.5–15.5)
WBC: 9.2 10*3/uL (ref 4.0–10.5)
nRBC: 0 % (ref 0.0–0.2)

## 2023-06-21 LAB — GLUCOSE, CAPILLARY
Glucose-Capillary: 101 mg/dL — ABNORMAL HIGH (ref 70–99)
Glucose-Capillary: 108 mg/dL — ABNORMAL HIGH (ref 70–99)

## 2023-06-21 LAB — PROTIME-INR
INR: 2.5 — ABNORMAL HIGH (ref 0.8–1.2)
Prothrombin Time: 27.5 s — ABNORMAL HIGH (ref 11.4–15.2)

## 2023-06-21 MED ORDER — WARFARIN SODIUM 1 MG PO TABS
0.5000 mg | ORAL_TABLET | Freq: Once | ORAL | 1 refills | Status: DC
  Filled 2023-06-21: qty 60, 60d supply, fill #0

## 2023-06-21 MED ORDER — PANTOPRAZOLE SODIUM 40 MG PO TBEC: 40.0000 mg | DELAYED_RELEASE_TABLET | Freq: Every evening | ORAL | Status: DC

## 2023-06-21 MED ORDER — TAB-A-VITE/IRON PO TABS: 1.0000 | ORAL_TABLET | Freq: Every day | ORAL | Status: AC

## 2023-06-21 MED ORDER — WARFARIN SODIUM 1 MG PO TABS: 1.0000 mg | ORAL_TABLET | Freq: Once | ORAL | Status: DC

## 2023-06-21 MED ORDER — METOPROLOL TARTRATE 25 MG PO TABS
25.0000 mg | ORAL_TABLET | Freq: Two times a day (BID) | ORAL | 1 refills | Status: DC
  Filled 2023-06-21: qty 60, 30d supply, fill #0

## 2023-06-21 MED ORDER — OXYCODONE HCL 5 MG PO TABS
5.0000 mg | ORAL_TABLET | Freq: Four times a day (QID) | ORAL | 0 refills | Status: AC | PRN
  Filled 2023-06-21: qty 28, 7d supply, fill #0

## 2023-06-21 MED ORDER — DM-GUAIFENESIN ER 30-600 MG PO TB12
1.0000 | ORAL_TABLET | Freq: Two times a day (BID) | ORAL | 0 refills | Status: DC
  Filled 2023-06-21: qty 30, 15d supply, fill #0

## 2023-06-21 NOTE — TOC Transition Note (Signed)
 Transition of Care [facility]) - Discharge Note Sherin Dingwall RN, BSN Transitions of Care Unit 4E- RN Case Manager See Treatment Team for direct phone #   Patient Details  Name: Rhonda [name] MRN: [id number] Date of Birth: March 26, 1956  [facility]) CM/SW Contact:  Rox Cope, RN Phone Number: 06/21/2023, 1:28 PM   Clinical Narrative:    Pt stable for transition home today, Ambulatory 02 sats checked and pt will need home 02- orders have been placed.  No HH needed at this time, per PT notes no f/u recommended.   CM spoke with pt at bedside- choice offered for DME provider- pt voiced she did not have a preference.  Pt confirmed she has RW available at home- declines needing RW for home.   Address, phone # confirmed.  Pt has transportation home.   Call made to [employer] liaison- referral accepted for Home 02- pt will be going to d/c lounge- liaison to deliver portable 02 for transport to the d/c lounge.   No further TOC needs noted.    Final next level of care: Home/Self Care Barriers to Discharge: Barriers Resolved   Patient Goals and CMS Choice Patient states their goals for this hospitalization and ongoing recovery are:: wants to recover CMS Medicare.gov Compare Post Acute Care list provided to:: Patient Choice offered to / list presented to : Patient      Discharge Placement               Home        Discharge Plan and Services Additional resources added to the After Visit Summary for     Discharge Planning Services: CM Consult Post Acute Care Choice: Durable Medical Equipment          DME Arranged: Oxygen DME Agency: [facility] Date DME Agency Contacted: 06/21/23 Time DME Agency Contacted: 1327 Representative spoke with at DME Agency: Marlou Sims HH Arranged: NA HH Agency: NA        Social Drivers of Health (SDOH) Interventions SDOH Screenings   Food Insecurity: No Food Insecurity (06/14/2023)  Housing: Low Risk  (06/14/2023)   Transportation Needs: No Transportation Needs (06/14/2023)  Utilities: Not At Risk (06/14/2023)  Financial Resource Strain: Not on File (09/23/2021)   Received from [place], [place]  Physical Activity: Not on File (09/23/2021)   Received from [place], [place]  Social Connections: Moderately Integrated (06/14/2023)  Stress: Not on File (09/23/2021)   Received from [facility], [place]  Tobacco Use: Medium Risk (06/14/2023)     Readmission Risk Interventions    06/21/2023    1:28 PM  Readmission Risk Prevention Plan  Transportation Screening Complete  Home Care Screening Complete  Medication Review (RN CM) Complete

## 2023-06-21 NOTE — Progress Notes (Signed)
 PHARMACY - ANTICOAGULATION CONSULT NOTE  Pharmacy Consult for warfarin Indication:  bioMVR/Afib  Allergies  Allergen Reactions   Ace Inhibitors Cough   Prednisone  Itching and Swelling    Itching on leg and arms swelling from knee to ankles and felt restless    Patient Measurements: Height: 5\' 2"  (157.5 cm) Weight: 76 kg (167 lb 8.8 oz) IBW/kg (Calculated) : 50.1 Heparin  Dosing Weight: 67 kg   Vital Signs: Temp: 98.4 F (36.9 C) (01/15 0746) Temp Source: Oral (01/15 0746) BP: 118/69 (01/15 0832) Pulse Rate: 88 (01/15 0832)  Labs: Recent Labs    06/19/23 0225 06/20/23 0256 06/21/23 0331  HGB 9.0* 8.9* 9.7*  HCT 27.8* 27.5* 29.9*  PLT 114* 136* 191  LABPROT 28.6* 44.6* 27.5*  INR 2.7* 4.7* 2.5*  CREATININE 0.80 0.63 0.59    Estimated Creatinine Clearance: 67 mL/min (by C-G formula based on SCr of 0.59 mg/dL).   Medical History: Past Medical History:  Diagnosis Date   Allergy    Atrial fibrillation (HCC)    CAD (coronary artery disease) 08/2009   BMS to RCA, non-obstructive in CX and LAD 2011   Cataract    CHF (congestive heart failure) (HCC)    Cirrhosis (HCC)    Dysrhythmia    A. Fib   Edema, peripheral    Heart murmur    Hypertension    Myocardial infarction [facility])    Nausea    Pneumonia    January 2020   Sleep apnea    No CPAP   Trichomoniasis 06/05/2018    Medications:  Scheduled:   arformoterol   15 mcg Nebulization BID   atorvastatin   20 mg Oral Daily   bisacodyl   10 mg Oral Daily   Or   bisacodyl   10 mg Rectal Daily   Chlorhexidine  Gluconate Cloth  6 each Topical Daily   dextromethorphan -guaiFENesin   1 tablet Oral BID   digoxin   0.125 mg Oral Daily   docusate sodium   200 mg Oral Daily   folic acid   1 mg Oral Daily   furosemide   40 mg Intravenous Daily   insulin  aspart  0-24 Units Subcutaneous TID WC   metoprolol  tartrate  25 mg Oral BID   Or   metoprolol  tartrate  12.5 mg Per Tube BID   multivitamins with iron   1 tablet Oral Daily    mouth rinse  15 mL Mouth Rinse 4 times per day   pneumococcal 20-valent conjugate vaccine  0.5 mL Intramuscular Tomorrow-1000   revefenacin   175 mcg Nebulization Daily   sodium chloride  flush  3 mL Intravenous Q12H   thiamine   100 mg Oral Daily   Warfarin - Physician Dosing Inpatient   Does not apply q1600    Assessment: 49 yof presenting for elective MVR/TVR. On apixaban  PTA for Afib (LD 1/3 AM). Pharmacy dosing warfarin (started 1/10)  -INR 4.7> 2.5. Hg= 8.9, plt= 136 -Of note, INR went up 1.7> 6.0 from 1/11 to 1/12 and vitamin K 1mg  po given -No major drug interactions noted  Goal of Therapy:  INR 2-3 Monitor platelets by anticoagulation protocol: Yes   Plan:  -Warfarin 1mg  today -Daily PT/INR -LFTs in am  Thank you for allowing pharmacy to participate in this patient's care,  Baxter Limber, PharmD Clinical Pharmacist **Pharmacist phone directory can now be found on [email or web address] ([email or web address]).  Listed under [facility].

## 2023-06-21 NOTE — Progress Notes (Signed)
 SATURATION QUALIFICATIONS: (This note is used to comply with regulatory documentation for home oxygen)  Patient Saturations on Room Air at Rest = 85%  Patient Saturations on Room Air while Ambulating = 80%  Patient Saturations on 2 Liters of oxygen while Ambulating = 92%  Please briefly explain why patient needs home oxygen:

## 2023-06-21 NOTE — Discharge Summary (Signed)
 [address]       [place] [address]             [phone]    Physician Discharge Summary  Patient ID: Rhonda [name] MRN: [id number] DOB/AGE: 66-Mar-1957 38 y.o.  Admit date: 06/14/2023 Discharge date: 06/21/2023  Admission Diagnoses:  Patient Active Problem List   Diagnosis Date Noted   S/P MVR (mitral valve repair) 06/14/2023   Nonrheumatic mitral valve regurgitation 03/08/2023   Nonrheumatic tricuspid valve regurgitation 03/08/2023   Alcoholic cirrhosis of liver with ascites (HCC)    Acute exacerbation of CHF (congestive heart failure) (HCC) 06/16/2020   Volume overload 06/16/2020   Pneumonia 06/20/2018   Postmenopausal bleeding 05/29/2018   Acute on chronic diastolic CHF (congestive heart failure) (HCC)    Acute on chronic congestive heart failure (HCC) 02/03/2016   CHF (congestive heart failure), NYHA class I (HCC) 02/03/2016   Essential hypertension 02/03/2016   Atrial fibrillation (HCC) 02/03/2016   Atrial flutter (HCC)      Discharge Diagnoses:  Patient Active Problem List   Diagnosis Date Noted   S/P MVR (mitral valve repair) 06/14/2023   Nonrheumatic mitral valve regurgitation 03/08/2023   Nonrheumatic tricuspid valve regurgitation 03/08/2023   Alcoholic cirrhosis of liver with ascites (HCC)    Acute exacerbation of CHF (congestive heart failure) (HCC) 06/16/2020   Volume overload 06/16/2020   Pneumonia 06/20/2018   Postmenopausal bleeding 05/29/2018   Acute on chronic diastolic CHF (congestive heart failure) (HCC)    Acute on chronic congestive heart failure (HCC) 02/03/2016   CHF (congestive heart failure), NYHA class I (HCC) 02/03/2016   Essential hypertension 02/03/2016   Atrial fibrillation (HCC) 02/03/2016   Atrial flutter (HCC)      Discharged Condition: good     History of Present Illness:    At the time of CT surgical evaluation Pt is a very pleasant 68 yo female who has been found to have severe MR and TR.  Pt with afib over the past 10 yrs not converted with two cardioversions now treated with rate control and anticoagulation. Pt has been having PND and now sleeps on several pillows. She also has had increasing fatigue and lower ext edema. TTE earlier this year with slightly depressed LV function and torrential MR and severe TR. Pt with severely dilated LA. 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 She underwent cath with no significant PHTN and moderate OM CAD. She was felt to need surgical intervention. Of note, she has alcoholic cirrhosis MELD-NA of 11 and followed by GI. She has mild verices but no history of GI bleed. She reports no alcohol except for rare drink now and then.  She was admitted this hospitalization for mitral valve and tricuspid valve repair.  Hospital course: The patient was admitted electively and taken to the operating room on 06/14/2023 at which time she underwent mitral valve repair using a 32 mm semiformed ring annuloplasty and tricuspid valve repair using a 32 mm Edwards MC 3 tricuspid annuloplasty ring.  Additionally she did undergo clipping of the left atrial appendage.  She tolerated the procedure well was taken to the surgical intensive care unit in stable condition.  Postoperative hospital course:  Patient remained intubated overnight and initially required pressor and inotropic support.  The PCCM team was consulted to assist with ICU management including ventilator.  On postoperative day #2 hemodynamics remained stable and inotropes  were continued to be weaned.  She was started on Coumadin .  This was managed by pharmacy.  She continued a course of diuretics and pulmonary continue to manage her while in the ICU. She was aggressively diuresed to assist weaning oxygen. She was started on pulmonary toilet. She has a history of sleep apnea requiring CPAP but has not used it in years. She required BIPAP at night.  Been treated  aggressively with nebulizers.  She developed atrial fibrillation with reasonably controlled rates, home digoxin  was restarted. She was felt stable for transfer to the progressive unit.  She continued to make good progress overall and her oxygen need significantly decreased but she still will require oxygen short-term at home.  Arrangements are made.  Incisions are healing well without evidence of infection.  Coumadin  has been difficult to dose with INR varying from 6.0 to most recent 2.5 with low dosing.  Does have a history of alcoholic cirrhosis.  Dosing at time of discharge will be 0.5 mg daily until INR is checked in the Coumadin  clinic and adjusted.  She is making adequate progress in her cardiac rehab modalities and feeling stronger daily.  Her chronic atrial fibrillation is rate controlled.  He has an expected acute blood loss anemia this is stabilized and is showing an improving trend with equilibration of volume status.  He has been diuresed appropriately post cardiac surgical fluid retention which is not clinically significant.  She does not appear to require diuretics at time of discharge.  Renal function has remained within normal limits after a slight bump in creatinine to 1.16 as peak and currently 0.59.  Overall, at the time of discharge the patient is felt to be quite stable.  Consults: pulmonary/intensive care  Significant Diagnostic Studies:  CT Chest Wo Contrast Result Date: 06/21/2023 CLINICAL DATA:  Preop thoracic aorta disease. EXAM: CT CHEST WITHOUT CONTRAST TECHNIQUE: Multidetector CT imaging of the chest was performed following the standard protocol without IV contrast. RADIATION DOSE REDUCTION: This exam was performed according to the departmental dose-optimization program which includes automated exposure control, adjustment of the mA and/or kV according to patient size and/or use of iterative reconstruction technique. COMPARISON:  None Available. FINDINGS: Cardiovascular:  Atherosclerotic calcification of the aorta, aortic valve and coronary arteries. Enlarged pulmonic trunk. Markedly enlarged heart. No definite pericardial fluid. Mediastinum/Nodes: Mildly heterogeneous thyroid . Thoracic inlet lymph nodes are not enlarged by CT size criteria. Mediastinal lymph nodes measure up to 9 mm in the AP window. Hilar regions are difficult to definitively evaluate without IV contrast. No axillary adenopathy. Esophagus is grossly unremarkable. Lungs/Pleura: Basilar septal thickening with moderate right and small left pleural effusions. Right pleural effusion is partially loculated. Volume loss in the inferior right middle lobe and lingula. Airway is unremarkable. Upper Abdomen: Liver margin may be minimally irregular. Slight left adrenal thickening. No specific follow-up necessary. Trace ascites. Visualized portions of the liver, adrenal glands, kidneys, spleen, pancreas, stomach and bowel are otherwise grossly unremarkable. No upper abdominal adenopathy. Musculoskeletal: Degenerative changes in the spine. Slight compression of the C7 superior endplate, likely old. IMPRESSION: 1. Congestive heart failure. 2. Questionable marginal irregularity of the liver. Difficult to exclude cirrhosis. 3. Trace ascites. 4. Aortic atherosclerosis (ICD10-I70.0). Coronary artery calcification. 5. Enlarged pulmonic trunk, indicative of pulmonary arterial hypertension. Electronically Signed   By: Shearon Denis M.D.   On: 06/21/2023 12:06   DG Chest 2 View Result Date: 06/20/2023 CLINICAL DATA:  Status post mitral valve repair EXAM: CHEST - 2 VIEW COMPARISON:  X-ray 06/19/2023.  Older exams as well FINDINGS: Enlarged cardiopericardial silhouette with sternal wires. Prosthetic valves. Atrial occlusion clip. Persistent small pleural effusions. Adjacent opacities. Decreasing vascular congestion. No pneumothorax. Overlapping cardiac leads. Degenerative changes of the spine on lateral view. IMPRESSION: Stable  postoperative changes. Persistent small effusions but improving vascular congestion and edema. Electronically Signed   By: Adrianna Horde M.D.   On: 06/20/2023 10:33   DG Chest Port 1 View Result Date: 06/19/2023 CLINICAL DATA:  Pulmonary edema. EXAM: PORTABLE CHEST 1 VIEW COMPARISON:  Chest radiograph dated 06/17/2023. FINDINGS: There is cardiomegaly with vascular congestion and edema. Small bilateral pleural effusions and bibasilar atelectasis or infiltrate overall no significant interval change since the prior radiograph. Median sternotomy wires and aortic valve repair. No acute osseous pathology. IMPRESSION: Cardiomegaly with vascular congestion and edema. Electronically Signed   By: Angus Bark M.D.   On: 06/19/2023 09:36   DG CHEST PORT 1 VIEW Result Date: 06/17/2023 CLINICAL DATA:  Status post mitral valve repair EXAM: PORTABLE CHEST 1 VIEW COMPARISON:  06/16/2023 FINDINGS: Changes of valve repair. Cardiomegaly with vascular congestion. Layering bilateral pleural effusions with bibasilar atelectasis. Diffuse interstitial prominence may reflect mild interstitial edema. No pneumothorax. IMPRESSION: Cardiomegaly with vascular congestion. Suspect mild interstitial edema. Layering bilateral effusions. Electronically Signed   By: Janeece Mechanic M.D.   On: 06/17/2023 08:24   DG CHEST PORT 1 VIEW Result Date: 06/16/2023 CLINICAL DATA:  [id number] Chest tube in place [id number] EXAM: PORTABLE CHEST 1 VIEW COMPARISON:  06/15/2023. FINDINGS: Mild pulmonary vascular congestion, unchanged. There are bilateral small layering pleural effusions, grossly unchanged. There are additional atelectasis and/or consolidation overlying the bilateral lower lung zones, also similar to the prior study. No pneumothorax. Stable moderately enlarged cardio-mediastinal silhouette. There are 2 mediastinal/pericardial drains, unchanged. Interval removal of endotracheal tube and enteric tube. Right IJ Swan-Ganz sheath, unchanged. No acute  osseous abnormalities. The soft tissues are within normal limits. IMPRESSION: *Mild pulmonary vascular congestion, bilateral layering pleural effusions and atelectasis/consolidation overlying the bilateral lower lung zones-essentially similar to the prior study. Electronically Signed   By: Beula Brunswick M.D.   On: 06/16/2023 08:08   DG Chest 2 View Result Date: 06/15/2023 CLINICAL DATA:  [id number] Pre-op chest exam [id number] EXAM: CHEST - 2 VIEW COMPARISON:  CT chest 06/12/2023, chest x-ray 06/15/2020 FINDINGS: Slightly worsened cardiomegaly (from 2022). Otherwise the heart and mediastinal contours are unchanged. Atherosclerotic plaque. Bibasilar airspace opacities likely atelectasis. Mild pulmonary edema. Right trace to small and left trace pleural effusions. No pneumothorax. No acute osseous abnormality. IMPRESSION: 1. Mild pulmonary edema with right trace to small and left trace pleural effusions. 2. Cardiomegaly. 3. Bibasilar airspace opacities likely atelectasis. 4.  Aortic Atherosclerosis (ICD10-I70.0). Electronically Signed   By: Morgane  Naveau M.D.   On: 06/15/2023 09:46   DG Chest Port 1 View Result Date: 06/15/2023 CLINICAL DATA:  [id number] Status post mitral valve annuloplasty [id number] S/P tricuspid valve repair [id number] EXAM: PORTABLE CHEST 1 VIEW COMPARISON:  Chest x-ray 06/14/2023, CT chest 06/12/2023 FINDINGS: Endotracheal tube terminates 3.5 cm above the carina. Enteric tube courses below the hemidiaphragm with tip and side port overlying the expected region of the gastric lumen. Inferior approach catheter with tip overlying the left upper abdomen/lower chest of unclear etiology. Mediastinal drain overlies the mediastinum. Right internal jugular central venous Cordis with tip overlying the expected region of the distal superior vena cava. Cardiomegaly with underlying pericardial effusion not excluded. The heart and mediastinal contours are unchanged. Atherosclerotic plaque. Mitral annular and  tricuspid valve replacements noted. Left atrial appendage clip. Increasing bilateral lower lobe airspace opacities. Persistent mild pulmonary edema. Interval increase in bilateral trace to small volume pleural effusion. No pneumothorax. No acute osseous abnormality.  Sternotomy wires are intact. IMPRESSION: 1. Lines and tubes as above. 2.  Cardiomegaly with underlying pericardial effusion not excluded. 3. Increased bilateral lower lung zone airspace opacities likely represent atelectasis. 4. Mild pulmonary edema with interval increase in bilateral trace to small volume pleural effusion. Electronically Signed   By: Morgane  Naveau M.D.   On: 06/15/2023 09:43   ECHO INTRAOPERATIVE TEE Result Date: 06/14/2023  *INTRAOPERATIVE TRANSESOPHAGEAL REPORT *  Patient Name:   SHAQUISHA CHIN Date of Exam: 06/14/2023 Medical Rec #:  [id number]              Height:       62.0 in Accession #:    [id number]             Weight:       170.0 lb Date of Birth:  12-Nov-1955              BSA:          1.78 m Patient Age:    67 years               BP:           115/78 mmHg Patient Gender: F                      HR:           86 bpm. Exam Location:  Anesthesiology Transesophogeal exam was perform intraoperatively during surgical procedure. Patient was closely monitored under general anesthesia during the entirety of examination. Indications:     Mitral Regurgitation Sonographer: Performing Phys: Lelan Purpura Diagnosing Phys: Lelan Purpura Complications: No known complications during this procedure. POST-OP IMPRESSIONS _ Left Ventricle: The left ventricular function is mildly reduced on inotropic support. _ Right Ventricle: The right ventricular function moderately reduced on inotropic support. FAC 23.6% _ Aorta: The aorta appears unchanged from pre-bypass. There is no dissection. _ Left Atrium: The left atrium appears unchanged from pre-bypass. _ Left Atrial Appendage: There is no residual LAA following surgical removal. _ Aortic Valve: The  aortic valve appears unchanged from pre-bypass. Trace AI. _ Mitral Valve: There is an annuloplasty ring in the mitral position. There is mild residual MR following repair. Mean PG . _ Tricuspid Valve: There is an annulopasty ring in the tricuspid position. There is trace TR following repair. _ Pulmonic Valve: The pulmonic valve appears unchanged from pre-bypass. Trace Pulmonic insufficiency.  PRE-OP FINDINGS  Left Ventricle: The left ventricle has low normal systolic function, with an ejection fraction of 50-55%. The cavity size was normal. Left ventricular diastolic parameters are consistent with Grade III diastolic dysfunction (restrictive). Right Ventricle: The right ventricle has borderline reduced systolic function. FAC 34.1%. The cavity was dialated with tricuspid annulus measured at 4.36cm. There is no increase in right ventricular wall thickness. Left Atrium: Left atrial size was severely dilated. No left atrial/left atrial appendage thrombus was detected.  nteratrial Septum: No atrial level shunt detected by color flow Doppler. There is no evidence of a patent foramen ovale. Pericardium: The pericardium was not assessed. There is pleural effusion in the right lateral region. Mitral Valve: The posterior mitral leaflet is shortened but moves appropriately. The anterior mitral leaflet appears thickened at the dital most tip. There is a coaptation defect between  the leaflets with associated severe mitral regurgitation. Etiology appears secondary to atrial / annulus dilation (Carpentier type Ia). The regurgitant jet is posteriorly directed. VC 0.8cm. 3D EROA 0.56cm^2 Tricuspid Valve: The tricuspid valve was normal in structure. Tricuspid valve regurgitation is moderate to severe by color flow Doppler. There appears to be systolic flow reversal in the hepatic veins. Aortic Valve: The aortic valve is normal in structure. Aortic valve regurgitation is trivial by color flow Doppler. There is no stenosis of the  aortic valve, with a calculated valve area of 1.53 cm. Pulmonic Valve: The pulmonic valve was normal in structure. Pulmonic valve regurgitation is trivial by color flow Doppler. Aorta: The aortic root and ascending aorta are normal in size and structure. There is evidence of plaque in the aortic arch and descending aorta. There is no dissection. Pulmonary Artery: The pulmonary artery is moderately dilated. +--------------+--------++ LEFT VENTRICLE          +----------------+----------++ +--------------+--------++  Diastology                 PLAX 2D                 +----------------+----------++ +--------------+--------++  LV e' lateral:  12.50 cm/s LVOT diam:    1.90 cm   +----------------+----------++ +--------------+--------++  LV E/e' lateral:12.8       LVOT Area:    2.84 cm  +----------------+----------++ +--------------+--------++                        +--------------+--------++ +---------------+------+-------+ RIGHT VENTRICLE              +---------------+------+-------+ RV FAC:        28.1 %        +---------------+------+-------+ TAPSE (M-mode):1.7 cm2.37 cm +---------------+------+-------+ +------------------+-----------++ AORTIC VALVE                  +------------------+-----------++ AV Area (Vmax):   1.70 cm    +------------------+-----------++ AV Area (Vmean):  1.68 cm    +------------------+-----------++ AV Area (VTI):    1.53 cm    +------------------+-----------++ AV Vmax:          109.50 cm/s +------------------+-----------++ AV Vmean:         65.800 cm/s +------------------+-----------++ AV VTI:           0.234 m     +------------------+-----------++ AV Peak Grad:     4.8 mmHg    +------------------+-----------++ AV Mean Grad:     2.0 mmHg    +------------------+-----------++ LVOT Vmax:        65.60 cm/s  +------------------+-----------++ LVOT Vmean:       38.900 cm/s +------------------+-----------++  LVOT VTI:         0.126 m     +------------------+-----------++ LVOT/AV VTI ratio:0.54        +------------------+-----------++  +--------------+-------++ AORTA                 +--------------+-------++ Ao Sinus diam:2.90 cm +--------------+-------++ Ao STJ diam:  2.6 cm  +--------------+-------++ Ao Asc diam:  3.20 cm +--------------+-------++ +--------------+----------++     +---------------+-----------++ MITRAL VALVE                 TRICUSPID VALVE            +--------------+----------++     +---------------+-----------++ MV Area (PHT):3.79 cm       TR Peak grad:  24.4 mmHg   +--------------+----------++     +---------------+-----------++ MV Peak grad: 10.6 mmHg  TR Vmax:       247.00 cm/s +--------------+----------++     +---------------+-----------++ MV Mean grad: 3.0 mmHg   +--------------+----------++     +--------------+-------+ MV Vmax:      1.63 m/s       SHUNTS                +--------------+----------++     +--------------+-------+ MV Vmean:     77.2 cm/s      Systemic VTI: 0.13 m  +--------------+----------++     +--------------+-------+ MV VTI:       0.27 m         Systemic Diam:1.90 cm +--------------+----------++     +--------------+-------+ MV PHT:       58.00 msec +--------------+----------++ MV Decel Time:200 msec   +--------------+----------++ +----------------+-----------++ MR Peak grad:   77.1 mmHg   +----------------+-----------++ MR Mean grad:   51.0 mmHg   +----------------+-----------++ MR Vmax:        439.00 cm/s +----------------+-----------++ MR Vmean:       334.0 cm/s  +----------------+-----------++ MR PISA:        9.05 cm    +----------------+-----------++ MR PISA Eff ROA:90 mm      +----------------+-----------++ MR PISA Radius: 1.20 cm     +----------------+-----------++ +--------------+-----------++ MV E velocity:160.00 cm/s +--------------+-----------++ MV A  velocity:48.00 cm/s  +--------------+-----------++ MV E/A ratio: 3.33        +--------------+-----------++  Lelan Purpura Electronically signed by Lelan Purpura Signature Date/Time: 06/14/2023/2:57:36 PM    Final    DG Chest Port 1 View Result Date: 06/14/2023 CLINICAL DATA:  Status post mitral and tricuspid valve repair EXAM: PORTABLE CHEST 1 VIEW COMPARISON:  06/12/2023 FINDINGS: Gross cardiomegaly status post interval median sternotomy with mitral and tricuspid annular prosthesis and left atrial appendage clip. Support apparatus includes endotracheal tube, esophagogastric tube, right neck vascular catheter and sheath, and mediastinal drainage tubes. Diffuse bilateral interstitial pulmonary opacity and small layering pleural effusions, consistent with edema. No focal airspace opacity. IMPRESSION: 1. Gross cardiomegaly status post interval median sternotomy with mitral and tricuspid annular prosthesis and left atrial appendage clip. 2. Diffuse bilateral interstitial pulmonary opacity and small layering pleural effusions, consistent with edema. No focal airspace opacity. 3. Support apparatus as above. Electronically Signed   By: Fredricka Jenny M.D.   On: 06/14/2023 14:22   EP STUDY Result Date: 06/14/2023 See surgical note for result.  VAS US  CAROTID Result Date: 06/12/2023 Carotid Arterial Duplex Study Patient Name:  MIGNON HUTTO  Date of Exam:   06/12/2023 Medical Rec #: [id number]               Accession #:    [id number] Date of Birth: Aug 30, 1955               Patient Gender: F Patient Age:   74 years Exam Location:  [facility] Procedure:      VAS US  CAROTID Referring Phys: Melene Sportsman --------------------------------------------------------------------------------  Indications:  Preop for Mitral Valve Repair. Risk Factors: Hypertension, hyperlipidemia, coronary artery disease. Performing Technologist: Florencia Hunter RDMS, RVT  Examination Guidelines: A complete evaluation includes B-mode  imaging, spectral Doppler, color Doppler, and power Doppler as needed of all accessible portions of each vessel. Bilateral testing is considered an integral part of a complete examination. Limited examinations for reoccurring indications may be performed as noted.  Right Carotid Findings: +----------+--------+--------+--------+------------------+------------------+           PSV cm/sEDV cm/sStenosisPlaque DescriptionComments           +----------+--------+--------+--------+------------------+------------------+  CCA Prox  63      26                                                   +----------+--------+--------+--------+------------------+------------------+ CCA Distal53      26                                                   +----------+--------+--------+--------+------------------+------------------+ ICA Prox  55      26      1-39%                     intimal thickening +----------+--------+--------+--------+------------------+------------------+ ICA Distal73      29                                tortuous           +----------+--------+--------+--------+------------------+------------------+ ECA       55      21                                                   +----------+--------+--------+--------+------------------+------------------+ +----------+--------+-------+----------------+-------------------+           PSV cm/sEDV cmsDescribe        Arm Pressure (mmHG) +----------+--------+-------+----------------+-------------------+ [id number]             Multiphasic, WNL                    +----------+--------+-------+----------------+-------------------+ +---------+--------+--+--------+--+---------+ VertebralPSV cm/s56EDV cm/s18Antegrade +---------+--------+--+--------+--+---------+ The right IJV appears moderately dilated at the proximal segment. Left Carotid Findings: +----------+--------+--------+--------+------------------+--------+           PSV  cm/sEDV cm/sStenosisPlaque DescriptionComments +----------+--------+--------+--------+------------------+--------+ CCA Prox  81      32                                         +----------+--------+--------+--------+------------------+--------+ CCA Distal74      23                                         +----------+--------+--------+--------+------------------+--------+ ICA Prox  59      25      1-39%   calcific                   +----------+--------+--------+--------+------------------+--------+ ICA Distal80      42                                tortuous +----------+--------+--------+--------+------------------+--------+ ECA       57      16                                         +----------+--------+--------+--------+------------------+--------+ +----------+--------+--------+----------------+-------------------+  PSV cm/sEDV cm/sDescribe        Arm Pressure (mmHG) +----------+--------+--------+----------------+-------------------+ [id number]              Multiphasic, WNL                    +----------+--------+--------+----------------+-------------------+ +---------+--------+--+--------+--+---------+ VertebralPSV cm/s53EDV cm/s24Antegrade +---------+--------+--+--------+--+---------+   Summary: Right Carotid: Velocities in the right ICA are consistent with a 1-39% stenosis. Left Carotid: Velocities in the left ICA are consistent with a 1-39% stenosis. Vertebrals:  Bilateral vertebral arteries demonstrate antegrade flow. Subclavians: Normal flow hemodynamics were seen in bilateral subclavian              arteries. *See table(s) above for measurements and observations.  Electronically signed by Jimmye Moulds MD on 06/12/2023 at 1:52:10 PM.    Final      Results for orders placed or performed during the hospital encounter of 06/14/23 (from the past 48 hours)  Glucose, capillary     Status: Abnormal   Collection Time: 06/19/23  4:50 PM   Result Value Ref Range   Glucose-Capillary 169 (H) 70 - 99 mg/dL    Comment: Glucose reference range applies only to samples taken after fasting for at least 8 hours.   Comment 1 Notify RN    Comment 2 Document in Chart   Protime-INR     Status: Abnormal   Collection Time: 06/20/23  2:56 AM  Result Value Ref Range   Prothrombin Time 44.6 (H) 11.4 - 15.2 seconds   INR 4.7 (HH) 0.8 - 1.2    Comment: REPEATED TO VERIFY CRITICAL RESULT CALLED TO, READ BACK BY AND VERIFIED WITH: CALLED TO APRIL REDMAN, RN 06/20/23 0648 BY MG (NOTE) INR goal varies based on device and disease states. Performed at [facility], [address], [place], [place] [address]   CBC     Status: Abnormal   Collection Time: 06/20/23  2:56 AM  Result Value Ref Range   WBC 7.6 4.0 - 10.5 K/uL   RBC 2.97 (L) 3.87 - 5.11 MIL/uL   Hemoglobin 8.9 (L) 12.0 - 15.0 g/dL   HCT 47.8 (L) 29.5 - 62.1 %   MCV 92.6 80.0 - 100.0 fL   MCH 30.0 26.0 - 34.0 pg   MCHC 32.4 30.0 - 36.0 g/dL   RDW 30.8 65.7 - 84.6 %   Platelets 136 (L) 150 - 400 K/uL    Comment: REPEATED TO VERIFY   nRBC 0.0 0.0 - 0.2 %    Comment: Performed at [facility], [address] [place], [place] [address]  Basic metabolic panel     Status: Abnormal   Collection Time: 06/20/23  2:56 AM  Result Value Ref Range   Sodium 136 135 - 145 mmol/L   Potassium 3.2 (L) 3.5 - 5.1 mmol/L   Chloride 84 (L) 98 - 111 mmol/L   CO2 42 (H) 22 - 32 mmol/L   Glucose, Bld 102 (H) 70 - 99 mg/dL    Comment: Glucose reference range applies only to samples taken after fasting for at least 8 hours.   BUN 13 8 - 23 mg/dL   Creatinine, Ser 2.84 0.44 - 1.00 mg/dL   Calcium  8.7 (L) 8.9 - 10.3 mg/dL   GFR, Estimated >13 >24 mL/min    Comment: (NOTE) Calculated using the CKD-EPI Creatinine Equation (2021)    Anion gap 10 5 - 15    Comment: Performed at [facility], [address], [place],  [place] [address]  Glucose, capillary     Status: Abnormal    Collection Time: 06/20/23  6:53 AM  Result Value Ref Range   Glucose-Capillary 104 (H) 70 - 99 mg/dL    Comment: Glucose reference range applies only to samples taken after fasting for at least 8 hours.  Glucose, capillary     Status: Abnormal   Collection Time: 06/20/23 11:29 AM  Result Value Ref Range   Glucose-Capillary 106 (H) 70 - 99 mg/dL    Comment: Glucose reference range applies only to samples taken after fasting for at least 8 hours.  Glucose, capillary     Status: Abnormal   Collection Time: 06/20/23  4:41 PM  Result Value Ref Range   Glucose-Capillary 101 (H) 70 - 99 mg/dL    Comment: Glucose reference range applies only to samples taken after fasting for at least 8 hours.  Protime-INR     Status: Abnormal   Collection Time: 06/21/23  3:31 AM  Result Value Ref Range   Prothrombin Time 27.5 (H) 11.4 - 15.2 seconds   INR 2.5 (H) 0.8 - 1.2    Comment: (NOTE) INR goal varies based on device and disease states. Performed at [facility], [address], [place], [place] [address]   CBC     Status: Abnormal   Collection Time: 06/21/23  3:31 AM  Result Value Ref Range   WBC 9.2 4.0 - 10.5 K/uL   RBC 3.20 (L) 3.87 - 5.11 MIL/uL   Hemoglobin 9.7 (L) 12.0 - 15.0 g/dL   HCT 09.8 (L) 11.9 - 14.7 %   MCV 93.4 80.0 - 100.0 fL   MCH 30.3 26.0 - 34.0 pg   MCHC 32.4 30.0 - 36.0 g/dL   RDW 82.9 56.2 - 13.0 %   Platelets 191 150 - 400 K/uL   nRBC 0.0 0.0 - 0.2 %    Comment: Performed at [facility], [address], [place], [place] [address]  Basic metabolic panel     Status: Abnormal   Collection Time: 06/21/23  3:31 AM  Result Value Ref Range   Sodium 134 (L) 135 - 145 mmol/L   Potassium 3.7 3.5 - 5.1 mmol/L   Chloride 84 (L) 98 - 111 mmol/L   CO2 41 (H) 22 - 32 mmol/L   Glucose, Bld 82 70 - 99 mg/dL    Comment: Glucose reference range applies only to samples taken after fasting for at least 8 hours.   BUN 11 8 - 23 mg/dL   Creatinine, Ser 4.69 0.44 - 1.00  mg/dL   Calcium  9.1 8.9 - 10.3 mg/dL   GFR, Estimated >62 >95 mL/min    Comment: (NOTE) Calculated using the CKD-EPI Creatinine Equation (2021)    Anion gap 9 5 - 15    Comment: Performed at [facility], [address], [place], [place] [address]  Glucose, capillary     Status: Abnormal   Collection Time: 06/21/23  8:23 AM  Result Value Ref Range   Glucose-Capillary 101 (H) 70 - 99 mg/dL    Comment: Glucose reference range applies only to samples taken after fasting for at least 8 hours.  Glucose, capillary     Status: Abnormal   Collection Time: 06/21/23 11:08 AM  Result Value Ref Range   Glucose-Capillary 108 (H) 70 - 99 mg/dL    Comment: Glucose reference range applies only to samples taken after fasting for at least 8 hours.  Treatments: surgery:  CARDIOVASCULAR SURGERY OPERATIVE NOTE   06/14/2023 Etoy Lattin [id number]   Surgeon:  Wiley Hanger, MD   First Assistant: Matt Song [facility]  Preoperative Diagnosis:  Severe Mitral and tricuspid regurgitation   Postoperative Diagnosis:  Same     Procedure: Mitral valve ring annuloplasty with a 32mm Simuform complete annuloplasty ring Tricuspid valve ring annuloplasty with a 32 mm MC3 tricuspid annuloplasty ring Left atrial appendage occlusion with a 50mm Medtronic penditure clip   Anesthesia:  General Endotracheal  Discharge Exam: Blood pressure 102/64, pulse 74, temperature 98.2 F (36.8 C), temperature source Oral, resp. rate 20, height 5\' 2"  (1.575 m), weight 76 kg, SpO2 95%.  General appearance: alert, cooperative, and no distress Heart: irregularly irregular rhythm Lungs: dim in bases Abdomen: benign Extremities: minor edema Wound: incis healing well   Discharge Medications:  The patient has been discharged on:   1.Beta Blocker:  Yes [ y  ]                              No   [   ]                              If No, reason:  2.Ace Inhibitor/ARB: Yes [   ]                                      No  [ n   ]                                     If No, reason:labile BP  3.Statin:   Yes Davis.Dad   ]                  No  [   ]                  If No, reason:  4.Ecasa:  Yes  [  y ]                  No   [   ]                  If No, reason: n Patient had ACS upon admission:  Plavix/P2Y12 inhibitor: Yes [   ]                                      No  [ n  ]     Discharge Instructions     Amb Referral to Cardiac Rehabilitation   Complete by: As directed    Diagnosis: Valve Repair   Valve: Mitral   After initial evaluation and assessments completed: Virtual Based Care may be provided alone or in conjunction with Phase 2 Cardiac Rehab based on patient barriers.: Yes   Intensive Cardiac Rehabilitation (ICR) MC location only OR Traditional Cardiac Rehabilitation (TCR) *If criteria for ICR are not met will enroll in TCR [facility] only): Yes   Discharge patient   Complete by: As directed    When home oxygen arrangements made   Discharge disposition: 01-Home or Self Care  Discharge patient date: 06/21/2023      Allergies as of 06/21/2023       Reactions   Ace Inhibitors Cough   Prednisone  Itching, Swelling   Itching on leg and arms swelling from knee to ankles and felt restless        Medication List     STOP taking these medications    apixaban  5 MG Tabs tablet Commonly known as: Eliquis    diltiazem  360 MG 24 hr capsule Commonly known as: CARDIZEM  CD   furosemide  40 MG tablet Commonly known as: LASIX    spironolactone  100 MG tablet Commonly known as: Aldactone        TAKE these medications    acetaminophen  500 MG tablet Commonly known as: TYLENOL  Take 500 mg by mouth every 6 (six) hours as needed.   albuterol  108 (90 Base) MCG/ACT inhaler Commonly known as: VENTOLIN  HFA Inhale 2 puffs into the lungs every 6 (six) hours as needed for wheezing or shortness of breath.   atorvastatin  20 MG tablet Commonly known as: LIPITOR Take 1 tablet (20 mg total) by  mouth every evening.   digoxin  0.125 MG tablet Commonly known as: Lanoxin  Take 0.5 tablets (0.0625 mg total) by mouth daily.   metoprolol  tartrate 25 MG tablet Commonly known as: LOPRESSOR  Take 1 tablet (25 mg total) by mouth 2 (two) times daily. What changed:  medication strength how much to take   Mucus Relief DM 30-600 MG Tb12 Take 1 tablet by mouth 2 (two) times daily.   multivitamins with iron  Tabs tablet Take 1 tablet by mouth daily. Start taking on: June 22, 2023   NON FORMULARY Take 2 capsules by mouth in the morning. Mood+ by [employer] GLOBAL   OVER THE COUNTER MEDICATION Take 1 packet by mouth daily. [employer] Edge+ Grape Plant-Based Nootropics   oxyCODONE  5 MG immediate release tablet Commonly known as: Oxy IR/ROXICODONE  Take 1 tablet (5 mg total) by mouth every 6 (six) hours as needed for up to 7 days for severe pain (pain score 7-10).   pantoprazole  40 MG tablet Commonly known as: PROTONIX  Take 1 tablet (40 mg total) by mouth every evening.   PROBIOTIC PO Take 1 packet by mouth daily. MentaBiotics Advance Gut Brain Nutrition Prebiotic/Probiotic/Phytobiotics   warfarin 1 MG tablet Commonly known as: COUMADIN  Take 0.5 tablets (0.5 mg total) by mouth one time daily at 4 PM.   [facility] MENTAL FOCUS PO Take 2 capsules by mouth in the morning. [employer] MentaFocus               Durable Medical Equipment  (From admission, onward)           Start     Ordered   06/21/23 1248  For home use only DME oxygen  Once       Question Answer Comment  Length of Need 6 Months   Mode or (Route) Nasal cannula   Liters per Minute 2   Frequency Continuous (stationary and portable oxygen unit needed)   Oxygen delivery system Gas      06/21/23 1248   06/19/23 0747  For home use only DME Walker rolling  Once       Question Answer Comment  Walker: With 5 Inch Wheels   Patient needs a walker to treat with the following condition Physical deconditioning   Patient needs a  walker to treat with the following condition S/P MVR (mitral valve repair)   Patient needs a walker to treat with the following condition S/P TVR (  tricuspid valve repair)      06/19/23 0747            Follow-up Information     [facility] Follow up on 07/05/2023.   Specialty: Cardiothoracic Surgery Why: Follow up appointment is at 1:00PM Contact information: [address] [place], [address] [place]  [address] [phone]        [facility] Follow up on 07/05/2023.   Why: To get chest xray at 12:00PM, 1 hour prior to your appointment Contact information: [address] [place]  [address]        Euell Herrlich, MD Follow up on 07/06/2023.   Specialties: Cardiology, Radiology Why: Cardiology follow up is at 2:00PM Contact information: [address] [place] [address] [phone]         Jonell Neptune, FNP Follow up.   Specialty: Nurse Practitioner Why: please call your PCP to arrange hospital follow up in 1-2 weeks. Contact information: [address] [place] [address] [phone]         [employer] Follow up.   Why: Home 02 arranged- they will bring portable 02 for transport home to the discharge lounge- and deliver home 02 concentrator later this afternoon. Contact information: [address] [place] [address] [phone]                 Signed:  Lindi Revering, PA-C  06/21/2023, 2:29 PM

## 2023-06-21 NOTE — Plan of Care (Signed)

## 2023-06-21 NOTE — Progress Notes (Signed)
[address]       [place] [address]             [phone]  HPI: This is a 68 year old female who is s/p mitral valve ring annuloplasty (using a  32 mm Simuform complete annuloplasty ring), tricuspid valve ring annuloplasty (using a 32 mm MC3 tricuspid annuloplasty ring), and left atrial appendage occlusion (using a 50mm Medtronic penditure clip) by Dr. Leafy Ro on 06/14/2023.Patient was discharged on 06/21/2023. She presents today for one week follow up. She is still on oxygen. She thinks her breathing is doing "pretty good".She has a minor nose bleed.   Current Outpatient Medications  Medication Sig Dispense Refill   dextromethorphan-guaiFENesin (MUCINEX DM) 30-600 MG 12hr tablet Take 1 tablet by mouth 2 (two) times daily. 30 tablet 0   metoprolol tartrate (LOPRESSOR) 25 MG tablet Take 1 tablet (25 mg total) by mouth 2 (two) times daily. 60 tablet 1   [START ON 06/22/2023] Multiple Vitamins-Iron (MULTIVITAMINS WITH IRON) TABS tablet Take 1 tablet by mouth daily.     oxyCODONE (OXY IR/ROXICODONE) 5 MG immediate release tablet Take 1 tablet (5 mg total) by mouth every 6 (six) hours as needed for up to 7 days for severe pain (pain score 7-10). 28 tablet 0   pantoprazole (PROTONIX) 40 MG tablet Take 1 tablet (40 mg total) by mouth every evening.     warfarin (COUMADIN) 1 MG tablet Take 0.5 tablets (0.5 mg total) by mouth one time only at 4 PM. 60 tablet 1   Vital Signs: Vitals:   07/05/23 1251  BP: 110/60  Pulse: 69  Resp: 18  SpO2: 95%     Physical Exam: CV-RRR Pulmonary-Slightly diminished bibasilar breath sounds Abdomen-Soft, non tender, bowel sounds present Extremities-++ LE edema Wound-Clean, dry, well healed  Diagnostic Tests: Narrative & Impression  CLINICAL DATA:  Mitral bowel repair.   EXAM: CHEST - 2 VIEW   COMPARISON:  Chest radiograph dated 06/20/2023.   FINDINGS: Small bilateral pleural effusions, right greater than left and associated  partial compressive atelectasis of the right lung base similar to prior radiograph. No pneumothorax. Stable cardiomegaly. Median sternotomy wires and cardiac valve repair. No acute osseous pathology.   IMPRESSION: Small bilateral pleural effusions, right greater than left.     Electronically Signed   By: Elgie Collard M.D.   On: 07/05/2023 13:54   Impression and Plan: We discussed the findings of today's chest x ray. Patient states she was not initially discharged on Spironolactone and Lasix as she had taken prior to surgery. She called the office and both of these have since been resumed. Because of increased weight and LE swelling, she did take Lasix 40 mg bid for 3 days and this seemed to help. She states her weight has decreased but still needs more fluid removed. She was instructed to take Lasix 40 mg bid and continue Spironolactone for next 3 days. She has an appointment tomorrow with cardiology so will defer on further diuretic regimen. I will also request a BMET be drawn at their office.  We talked about the need for her to continue sternal precautions and need for endocarditis prophylaxis. We did do a walk test and she still requires oxygen. Will likely be able to stop oxygen once further diuresed.  We will talk about participation in cardiac rehab and driving at the next office visit. She will see Dr. Jacques Navy (cardiology) tomorrow, and have a PT/INR done . She has an  echocardiogram scheduled for February 26th.   Ardelle Balls, PA-C [facility] [phone]

## 2023-06-21 NOTE — Progress Notes (Addendum)
 7 Days Post-Op Procedure(s) (LRB): MITRAL VALVE REPAIR USING 32 MM SMUFORM SEMI-RIGID ANNULOPLASTY RING (N/A) TRICUSPID VALVE REPAIR USING 32 MM EDWARDS MC3 TRICUSPID ANNULOPLASTY RING (N/A) CLIPPING OF ATRIAL APPENDAGE USING 50 MM MEDTRONIC PENDITURE LAA CLIP (N/A) TRANSESOPHAGEAL ECHOCARDIOGRAM (N/A) Subjective: Conts to feel stronger  Objective: Vital signs in last 24 hours: Temp:  [97.5 F (36.4 C)-98.7 F (37.1 C)] 98.7 F (37.1 C) (01/14 1606) Pulse Rate:  [70-100] 78 (01/15 0746) Cardiac Rhythm: Sinus tachycardia (01/14 2100) Resp:  [15-20] 15 (01/15 0555) BP: (98-115)/(58-68) 115/60 (01/14 2110) SpO2:  [89 %-97 %] 97 % (01/15 0746) Weight:  [76 kg] 76 kg (01/15 0555)  Hemodynamic parameters for last 24 hours:    Intake/Output from previous day: 01/14 0701 - 01/15 0700 In: 483 [P.O.:480; I.V.:3] Out: 1100 [Urine:1100] Intake/Output this shift: No intake/output data recorded.  General appearance: alert, cooperative, and no distress Heart: irregularly irregular rhythm Lungs: dim in bases Abdomen: benign Extremities: minor edema Wound: incis healing well  Lab Results: Recent Labs    06/20/23 0256 06/21/23 0331  WBC 7.6 9.2  HGB 8.9* 9.7*  HCT 27.5* 29.9*  PLT 136* 191   BMET:  Recent Labs    06/20/23 0256 06/21/23 0331  NA 136 134*  K 3.2* 3.7  CL 84* 84*  CO2 42* 41*  GLUCOSE 102* 82  BUN 13 11  CREATININE 0.63 0.59  CALCIUM  8.7* 9.1    PT/INR:  Recent Labs    06/21/23 0331  LABPROT 27.5*  INR 2.5*   ABG    Component Value Date/Time   PHART 7.281 (L) 06/16/2023 0329   HCO3 26.2 06/16/2023 0329   TCO2 28 06/16/2023 0329   ACIDBASEDEF 1.0 06/16/2023 0329   O2SAT 93 06/16/2023 0329   CBG (last 3)  Recent Labs    06/20/23 0653 06/20/23 1129 06/20/23 1641  GLUCAP 104* 106* 101*    Meds Scheduled Meds:  arformoterol   15 mcg Nebulization BID   atorvastatin   20 mg Oral Daily   bisacodyl   10 mg Oral Daily   Or   bisacodyl   10 mg  Rectal Daily   Chlorhexidine  Gluconate Cloth  6 each Topical Daily   dextromethorphan -guaiFENesin   1 tablet Oral BID   digoxin   0.125 mg Oral Daily   docusate sodium   200 mg Oral Daily   folic acid   1 mg Oral Daily   furosemide   40 mg Intravenous Daily   insulin  aspart  0-24 Units Subcutaneous TID WC   metoprolol  tartrate  25 mg Oral BID   Or   metoprolol  tartrate  12.5 mg Per Tube BID   multivitamins with iron   1 tablet Oral Daily   mouth rinse  15 mL Mouth Rinse 4 times per day   pneumococcal 20-valent conjugate vaccine  0.5 mL Intramuscular Tomorrow-1000   revefenacin   175 mcg Nebulization Daily   sodium chloride  flush  3 mL Intravenous Q12H   thiamine   100 mg Oral Daily   Warfarin - Physician Dosing Inpatient   Does not apply q1600   Continuous Infusions: PRN Meds:.hydrOXYzine , ipratropium-albuterol , metoprolol  tartrate, morphine  injection, ondansetron  (ZOFRAN ) IV, mouth rinse, oxyCODONE , sodium chloride  flush, traMADol   Xrays DG Chest 2 View Result Date: 06/20/2023 CLINICAL DATA:  Status post mitral valve repair EXAM: CHEST - 2 VIEW COMPARISON:  X-ray 06/19/2023.  Older exams as well FINDINGS: Enlarged cardiopericardial silhouette with sternal wires. Prosthetic valves. Atrial occlusion clip. Persistent small pleural effusions. Adjacent opacities. Decreasing vascular congestion. No pneumothorax. Overlapping cardiac leads. Degenerative  changes of the spine on lateral view. IMPRESSION: Stable postoperative changes. Persistent small effusions but improving vascular congestion and edema. Electronically Signed   By: Adrianna Horde M.D.   On: 06/20/2023 10:33    Assessment/Plan: S/P Procedure(s) (LRB): MITRAL VALVE REPAIR USING 32 MM SMUFORM SEMI-RIGID ANNULOPLASTY RING (N/A) TRICUSPID VALVE REPAIR USING 32 MM EDWARDS MC3 TRICUSPID ANNULOPLASTY RING (N/A) CLIPPING OF ATRIAL APPENDAGE USING 50 MM MEDTRONIC PENDITURE LAA CLIP (N/A) TRANSESOPHAGEAL ECHOCARDIOGRAM (N/A) POD#7  1 afeb,afib,  chronic 2 sats ok on 1 liter 3 good UOP, normal renal fxn 4 INR 2.5- pharm dosing coumadin  5 H/H improving ABLA 6 thrombocytopenia improved 7 cont pulm hygiene, rehab modalities , home soon when off O2  Addendum- qualifies for home O2, will arrange and d/c later today   LOS: 7 days    Lindi Revering PA-C Pager [phone] 06/21/2023

## 2023-06-21 NOTE — Progress Notes (Signed)
 Physical Therapy Discharge Note Patient Details Name: Rhonda [name] MRN: [id number] DOB: Jun 17, 1955 Today's Date: 06/21/2023   History of Present Illness Pt is 68 yo presenting to [facility] on 1/8 for elective MVR/TVR. PMH: severe MR and TR, diastolic CHF, alcoholic cirrhosis w/mild varices. Afib on eliquis , HTN, CAD s/p RCA stent 2011, OSA.    PT Comments  Pt is currently Mod I for sit to stand and gait with rollator. Supervision for stairs per home set up. Pt has no further need for skilled physical therapy services in acute care hospital setting. Currently pt is presenting at baseline level of functioning and no skilled physical therapy services recommended. Pt will be discharged from skilled physical therapy services at this time; please re-consult if further needs arise.        If plan is discharge home, recommend the following: Assist for transportation;Help with stairs or ramp for entrance;Assistance with cooking/housework     Equipment Recommendations  None recommended by PT       Precautions / Restrictions Precautions Precautions: Sternal Precaution Booklet Issued: No Precaution Comments: reviewed sternal precautions Restrictions Weight Bearing Restrictions Per Provider Order: Yes RUE Weight Bearing Per Provider Order: Non weight bearing LUE Weight Bearing Per Provider Order: Non weight bearing Other Position/Activity Restrictions: sternal precautions     Mobility  Bed Mobility Overal bed mobility: Modified Independent             General bed mobility comments: pt sitting EOB on arrival.    Transfers Overall transfer level: Modified independent Equipment used: None, Rollator (4 wheels) Transfers: Sit to/from Stand Sit to Stand: Modified independent (Device/Increase time)                Ambulation/Gait Ambulation/Gait assistance: Modified independent (Device/Increase time) Gait Distance (Feet): 300 Feet Assistive device: Rollator (4 wheels) Gait  Pattern/deviations: Step-through pattern, Decreased stride length Gait velocity: decreased Gait velocity interpretation: 1.31 - 2.62 ft/sec, indicative of limited community ambulator       Stairs Stairs: Yes Stairs assistance: Contact guard assist, Supervision Stair Management: One rail Left, No rails Number of Stairs: 6 General stair comments: initially pt was trying to navigate stairs without rail. Encouarged to use the rail for balance without pulling up on the rail to maintain sternal precautions. Pt improved to Supervision level.       Balance Overall balance assessment: Modified Independent          Cognition Arousal: Alert Behavior During Therapy: WFL for tasks assessed/performed Overall Cognitive Status: Within Functional Limits for tasks assessed         General Comments: pt able to re-call all precautions           General Comments General comments (skin integrity, edema, etc.): No shortness of breathe during gait with 2 L O2 via [place]      Pertinent Vitals/Pain Pain Assessment Pain Assessment: No/denies pain     PT Goals (current goals can now be found in the care plan section) Acute Rehab PT Goals Patient Stated Goal: to return home PT Goal Formulation: With patient Time For Goal Achievement: 07/02/23 Potential to Achieve Goals: Good Progress towards PT goals: Goals met/education completed, patient discharged from PT    Frequency    Min 1X/week      PT Plan  Pt will be discharged at this time.     AM-PAC PT "6 Clicks" Mobility   Outcome Measure  Help needed turning from your back to your side while in a flat bed  without using bedrails?: None Help needed moving from lying on your back to sitting on the side of a flat bed without using bedrails?: None Help needed moving to and from a bed to a chair (including a wheelchair)?: None Help needed standing up from a chair using your arms (e.g., wheelchair or bedside chair)?: None Help needed to  walk in hospital room?: None Help needed climbing 3-5 steps with a railing? : A Little 6 Click Score: 23    End of Session Equipment Utilized During Treatment: Oxygen;Gait belt Activity Tolerance: Patient tolerated treatment well Patient left: in bed;with call bell/phone within reach Nurse Communication: Mobility status PT Visit Diagnosis: Other abnormalities of gait and mobility (R26.89)     Time: 3875-6433 PT Time Calculation (min) (ACUTE ONLY): 13 min  Charges:    $Therapeutic Activity: 8-22 mins PT General Charges $$ ACUTE PT VISIT: 1 Visit                    Sloan Duncans, DPT, CLT  Acute Rehabilitation Services Office: [phone] (Secure chat preferred)    Jenice Mitts 06/21/2023, 2:03 PM

## 2023-06-24 ENCOUNTER — Telehealth: Payer: Self-pay | Admitting: Thoracic Surgery (Cardiothoracic Vascular Surgery)

## 2023-06-24 ENCOUNTER — Encounter: Payer: Self-pay | Admitting: Thoracic Surgery (Cardiothoracic Vascular Surgery)

## 2023-06-24 NOTE — Telephone Encounter (Signed)
Patient called with c/o increased swelling in legs Was sent home without Lasix She has been on it chronically Will resume Lasix 40 mg daily  Rhonda [name] Fetch, MD [facility] [phone]

## 2023-06-26 ENCOUNTER — Other Ambulatory Visit: Payer: Self-pay

## 2023-06-26 ENCOUNTER — Telehealth: Payer: Self-pay | Admitting: *Deleted

## 2023-06-26 ENCOUNTER — Ambulatory Visit: Payer: Medicare Other | Attending: Cardiovascular Disease

## 2023-06-26 DIAGNOSIS — I4891 Unspecified atrial fibrillation: Secondary | ICD-10-CM | POA: Diagnosis not present

## 2023-06-26 DIAGNOSIS — Z9889 Other specified postprocedural states: Secondary | ICD-10-CM

## 2023-06-26 DIAGNOSIS — Z7901 Long term (current) use of anticoagulants: Secondary | ICD-10-CM | POA: Insufficient documentation

## 2023-06-26 LAB — POCT INR: INR: 1.2 — AB (ref 2.0–3.0)

## 2023-06-26 NOTE — Telephone Encounter (Signed)
Patient called inquiring about lasix, weight gain and swelling. Patient states she was advised to resume lasix 40mg  daily by on call provider. Patient states she is up 11lbs since hospital discharge. States she took an additional lasix last night and is currently down 5lbs. Discussed with E. Barrett, PA, advised patient to go back on home regimen of lasix and spironolactone. Patient verbalized understanding.

## 2023-06-26 NOTE — Patient Instructions (Signed)
Take 1.5 tablets tonight only then Increase to 0.5 tablet Daily, except 1 on Mondays and Fridays.  INR in 1 week. [phone]  A full discussion of the nature of anticoagulants has been carried out.  A benefit risk analysis has been presented to the patient, so that they understand the justification for choosing anticoagulation at this time. The need for frequent and regular monitoring, precise dosage adjustment and compliance is stressed.  Side effects of potential bleeding are discussed.  The patient should avoid any OTC items containing aspirin or ibuprofen, and should avoid great swings in general diet.  Avoid alcohol consumption.  Call if any signs of abnormal bleeding.

## 2023-06-30 ENCOUNTER — Telehealth: Payer: Self-pay

## 2023-06-30 NOTE — Telephone Encounter (Signed)
-----   Message from [place] Barrett sent at 06/30/2023  3:10 PM EST ----- Regarding: RE: Fluid retention and weight gain She is not uncomplicated.  Has a history of cirrhosis and these people can have a large amount of fluid issues.  I would tell her to double her lasix for 3 days.. She ultimately should follow up with cards for heart failure ----- Message ----- From: Steve Rattler, RN Sent: 06/30/2023   2:08 PM EST To: Harriet Pho, PA-C Subject: Fluid retention and weight gain                Hey,  This patient called today stating she was continuing to gain weight even after restarting her home Lasix/ Spironolactone. She was not restarted on it at discharge and it is a chronic medication. Dr. Dorris Fetch restarted it 1/18 because she called over the weekend. She was 167 lbs at discharge and today she is 180. She is taking her diuretics as prescribed and she said that she is eating a low sodium diet. She is not urinating as frequently, she said, but urine is pale yellow. Bladder does not feel full/distended. She is to follow up with [place] on Wednesday next week, then Cards that Thursday.   Please advise, Morrie Sheldon

## 2023-06-30 NOTE — Telephone Encounter (Signed)
Patient contacted and advised to increase her Lasix to 80 mg daily for three days. Patient aware and acknowledged receipt. Also advised to contact her Cardiologist if weight does not decrease after doubling dosage. She acknowledged receipt.

## 2023-07-03 ENCOUNTER — Encounter: Payer: Self-pay | Admitting: *Deleted

## 2023-07-03 ENCOUNTER — Other Ambulatory Visit: Payer: Self-pay | Admitting: Thoracic Surgery (Cardiothoracic Vascular Surgery)

## 2023-07-03 ENCOUNTER — Encounter: Payer: Medicare Other | Attending: Internal Medicine | Admitting: *Deleted

## 2023-07-03 DIAGNOSIS — I4891 Unspecified atrial fibrillation: Secondary | ICD-10-CM

## 2023-07-03 DIAGNOSIS — Z9889 Other specified postprocedural states: Secondary | ICD-10-CM

## 2023-07-03 NOTE — Progress Notes (Signed)
Virtual orientation call completed today. shehas an appointment on Date: not set until able to drive, she will call  for EP eval and gym Orientation.  Documentation of diagnosis can be found in [facility]  Date: 06/14/2023 .   Rhonda [name] is a former tobacco user. Intervention for tobacco cessation was provided at the initial medical review. She was asked about readiness to quit and reported she quit right before her surgery, had weaned down to 2 cigarettes a day. . Patient was advised and educated about tobacco cessation using combination therapy, tobacco cessation classes, quit line, and quit smoking apps. Patient demonstrated understanding of this material. Staff will continue to provide encouragement and follow up with the patient throughout the program.

## 2023-07-05 ENCOUNTER — Ambulatory Visit
Admission: RE | Admit: 2023-07-05 | Discharge: 2023-07-05 | Disposition: A | Discharge status: Home / Self Care | Payer: Medicare Other | Source: Ambulatory Visit | Attending: Thoracic Surgery (Cardiothoracic Vascular Surgery) | Admitting: Thoracic Surgery (Cardiothoracic Vascular Surgery)

## 2023-07-05 ENCOUNTER — Ambulatory Visit: Payer: Medicare Other

## 2023-07-05 ENCOUNTER — Telehealth: Payer: Self-pay | Admitting: *Deleted

## 2023-07-05 VITALS — BP 110/60 | HR 69 | Resp 18 | Ht 62.0 in | Wt 172.0 lb

## 2023-07-05 DIAGNOSIS — Z9889 Other specified postprocedural states: Secondary | ICD-10-CM

## 2023-07-05 DIAGNOSIS — I4891 Unspecified atrial fibrillation: Secondary | ICD-10-CM

## 2023-07-05 NOTE — Patient Instructions (Signed)
Continue to avoid any heavy lifting or strenuous use of your arms or shoulders for at least a total of two months from the time of surgery.  After two months, you may gradually increase how much you lift or otherwise use your arms or chest as tolerated, with limits based upon whether or not activities lead to the return of significant discomfort.  2. Endocarditis is a potentially serious infection of heart valves or inside lining of the heart.  It occurs more commonly in patients with diseased heart valves (such as patient's with aortic or mitral valve disease) and in patients who have undergone heart valve repair or replacement.  Certain surgical and dental procedures may put you at risk, such as dental cleaning, other dental procedures, or any surgery involving the respiratory, urinary, gastrointestinal tract, gallbladder or prostate gland.   To minimize your chances for develooping endocarditis, maintain good oral health and seek prompt medical attention for any infections involving the mouth, teeth, gums, skin or urinary tract.    Always notify your doctor or dentist about your underlying heart valve condition before having any invasive procedures. You will need to take antibiotics before certain procedures, including all routine dental cleanings or other dental procedures.  Your cardiologist or dentist should prescribe these antibiotics for you to be taken ahead of time.

## 2023-07-05 NOTE — Telephone Encounter (Signed)
Performed 6 min walk test on patient.   Patient ambulated 470ft without O2 for 4.97mins before stopping walk test d/t knee pain. Patient denied SOB. Oxygen dropped to 88-89% during test. Patient tolerated walk test without any breathing difficulties.

## 2023-07-06 ENCOUNTER — Ambulatory Visit: Payer: Medicare Other | Admitting: *Deleted

## 2023-07-06 ENCOUNTER — Ambulatory Visit: Payer: Medicare Other | Attending: Internal Medicine | Admitting: Internal Medicine

## 2023-07-06 ENCOUNTER — Encounter: Payer: Self-pay | Admitting: Internal Medicine

## 2023-07-06 VITALS — BP 100/56 | HR 69 | Ht 61.5 in | Wt 167.0 lb

## 2023-07-06 DIAGNOSIS — I4821 Permanent atrial fibrillation: Secondary | ICD-10-CM

## 2023-07-06 DIAGNOSIS — Z9889 Other specified postprocedural states: Secondary | ICD-10-CM

## 2023-07-06 DIAGNOSIS — I10 Essential (primary) hypertension: Secondary | ICD-10-CM | POA: Diagnosis not present

## 2023-07-06 DIAGNOSIS — Z79899 Other long term (current) drug therapy: Secondary | ICD-10-CM

## 2023-07-06 DIAGNOSIS — Z7901 Long term (current) use of anticoagulants: Secondary | ICD-10-CM

## 2023-07-06 DIAGNOSIS — I361 Nonrheumatic tricuspid (valve) insufficiency: Secondary | ICD-10-CM

## 2023-07-06 DIAGNOSIS — I4891 Unspecified atrial fibrillation: Secondary | ICD-10-CM

## 2023-07-06 DIAGNOSIS — D6869 Other thrombophilia: Secondary | ICD-10-CM

## 2023-07-06 DIAGNOSIS — I340 Nonrheumatic mitral (valve) insufficiency: Secondary | ICD-10-CM

## 2023-07-06 LAB — POCT INR: INR: 1.1 — AB (ref 2.0–3.0)

## 2023-07-06 MED ORDER — METOPROLOL TARTRATE 25 MG PO TABS: 25.0000 mg | ORAL_TABLET | Freq: Two times a day (BID) | ORAL | 2 refills | Status: DC

## 2023-07-06 NOTE — Progress Notes (Unsigned)
  Cardiology Office Note:  .   Date:  07/06/2023  ID:  Rhonda, [name] March 05, 1956, MRN [id number] PCP: Valerie Roys, FNP  [facility] Providers Cardiologist:  Parke Poisson, MD    History of Present Illness: .   Rhonda [name] is a 68 y.o. female.  Discussed the use of AI scribe software for clinical note transcription with the patient, who gave verbal consent to proceed.  History of Present Illness            ROS: negative except per HPI above.  Studies Reviewed: .        Results         Risk Assessment/Calculations:   {Does this patient have ATRIAL FIBRILLATION?:[phone]}         Physical Exam:   VS:  BP (!) 100/56   Pulse 69   Ht 5' 1.5" (1.562 m)   Wt 167 lb (75.8 kg)   SpO2 97%   BMI 31.04 kg/m    Wt Readings from Last 3 Encounters:  07/06/23 167 lb (75.8 kg)  07/05/23 172 lb (78 kg)  06/21/23 167 lb 8.8 oz (76 kg)     Physical Exam         GEN: Well nourished, well developed in no acute distress NECK: No JVD; No carotid bruits CARDIAC: ***RRR, no murmurs, rubs, gallops RESPIRATORY:  Clear to auscultation without rales, wheezing or rhonchi  ABDOMEN: Soft, non-tender, non-distended EXTREMITIES:  No edema; No deformity   ASSESSMENT AND PLAN: .    Assessment & Plan Atrial fibrillation, unspecified type [facility])  Essential hypertension   Assessment and Plan              {The patient has an active order for outpatient cardiac rehabilitation.   Please indicate if the patient is ready to start. Do NOT delete this.  It will auto delete.  Refresh note, then sign.              Click here to document readiness and see contraindications.  :1}  Cardiac Rehabilitation Eligibility Assessment      {Are you ordering a CV Procedure (e.g. stress test, cath, DCCV, TEE, etc)?   Press F2        :[id number]}   I spent *** minutes in the care of Rhonda [name] today including {CHL AMB CAR Time Based Billing  Options STW (Optional):[phone]::"documenting in the encounter."}

## 2023-07-06 NOTE — Patient Instructions (Signed)
Medication Instructions:  Your physician recommends that you continue on your current medications as directed. Please refer to the Current Medication list given to you today.  *If you need a refill on your cardiac medications before your next appointment, please call your pharmacy*  Lab Work: BMET and Magnesium today If you have labs (blood work) drawn today and your tests are completely normal, you will receive your results only by: MyChart Message (if you have MyChart) OR A paper copy in the mail If you have any lab test that is abnormal or we need to change your treatment, we will call you to review the results.  Testing/Procedures: None  Follow-Up: At [facility], you and your health needs are our priority.  As part of our continuing mission to provide you with exceptional heart care, we have created designated Provider Care Teams.  These Care Teams include your primary Cardiologist (physician) and Advanced Practice Providers (APPs -  Physician Assistants and Nurse Practitioners) who all work together to provide you with the care you need, when you need it.   Your next appointment:    07/31/2023 at 1:00 pm  Provider:   Parke Poisson, MD

## 2023-07-06 NOTE — Patient Instructions (Addendum)
Description   Today and tomorrow take 1.5 tablets then START taking 1 tablet daily, except 1/2 on Mondays and Saturdays. Start eating your leafy salad once a week with cranberries on top. Recheck INR in 1 week. [phone]

## 2023-07-07 LAB — BASIC METABOLIC PANEL
BUN/Creatinine Ratio: 20 (ref 12–28)
BUN: 16 mg/dL (ref 8–27)
CO2: 31 mmol/L — ABNORMAL HIGH (ref 20–29)
Calcium: 9.9 mg/dL (ref 8.7–10.3)
Chloride: 97 mmol/L (ref 96–106)
Creatinine, Ser: 0.79 mg/dL (ref 0.57–1.00)
Glucose: 100 mg/dL — ABNORMAL HIGH (ref 70–99)
Potassium: 5.1 mmol/L (ref 3.5–5.2)
Sodium: 142 mmol/L (ref 134–144)
eGFR: 83 mL/min/{1.73_m2} (ref >59)

## 2023-07-07 LAB — MAGNESIUM: Magnesium: 1.6 mg/dL (ref 1.6–2.3)

## 2023-07-13 ENCOUNTER — Ambulatory Visit: Payer: Medicare Other | Attending: Cardiovascular Disease | Admitting: *Deleted

## 2023-07-13 DIAGNOSIS — Z5181 Encounter for therapeutic drug level monitoring: Secondary | ICD-10-CM | POA: Diagnosis not present

## 2023-07-13 DIAGNOSIS — Z9889 Other specified postprocedural states: Secondary | ICD-10-CM

## 2023-07-13 DIAGNOSIS — I4891 Unspecified atrial fibrillation: Secondary | ICD-10-CM

## 2023-07-13 LAB — POCT INR: POC INR: 1.1

## 2023-07-13 NOTE — Patient Instructions (Signed)
 Description   Take 1.5 tablets of warfarin today and then START taking warfarin 1 tablet daily. Start eating your leafy salad once a week with cranberries on top. Recheck INR in 1 week. [phone]

## 2023-07-20 ENCOUNTER — Ambulatory Visit: Payer: Medicare Other

## 2023-07-20 ENCOUNTER — Encounter: Payer: Self-pay | Admitting: Thoracic Surgery (Cardiothoracic Vascular Surgery)

## 2023-07-20 ENCOUNTER — Telehealth: Payer: Self-pay

## 2023-07-20 ENCOUNTER — Ambulatory Visit (INDEPENDENT_AMBULATORY_CARE_PROVIDER_SITE_OTHER): Payer: Self-pay | Admitting: Thoracic Surgery (Cardiothoracic Vascular Surgery)

## 2023-07-20 VITALS — BP 120/71 | HR 67 | Resp 20 | Wt 166.0 lb

## 2023-07-20 DIAGNOSIS — Z9889 Other specified postprocedural states: Secondary | ICD-10-CM

## 2023-07-20 MED ORDER — APIXABAN 5 MG PO TABS: 5.0000 mg | ORAL_TABLET | Freq: Two times a day (BID) | ORAL | 1 refills | Status: DC

## 2023-07-20 NOTE — Patient Instructions (Signed)
Stop coumadin Start eliquis as before

## 2023-07-20 NOTE — Telephone Encounter (Signed)
   Name: Blanca Carreon  DOB: August 06, 1955  MRN: [id number]  Primary Cardiologist: Parke Poisson, MD  Chart reviewed as part of pre-operative protocol coverage. The patient has an upcoming visit scheduled with Dr. Jacques Navy on 07/31/2023 at which time clearance can be addressed in case there are any issues that would impact surgical recommendations.  Surgery date is TBD. I added preop FYI to appointment note so that provider is aware to address at time of outpatient visit.  Per office protocol the cardiology provider should forward their finalized clearance decision and recommendations regarding antiplatelet therapy to the requesting party below.    I will route this message as FYI to requesting party and remove this message from the preop box as separate preop APP input not needed at this time.   Please call with any questions.  Denyce Robert, NP  07/20/2023, 10:16 AM

## 2023-07-20 NOTE — Progress Notes (Signed)
[address]       [place] [address]             [phone]           Rhonda [name] [facility] Medical Record #[id number] Date of Birth: [birth year]  [name], FNP  Chief Complaint:   follow up MV and TV repair  History of Present Illness:     Pt is 1 month out from above and is doing well. She has stopped her oxygen and her O2 sats good at home. She is active and without SOB. She feels her water retention gone. She has seen cardiology and will have echo in a few weeks. She is starting cardiac rehab on Monday and has started driving      Past Medical History:  Diagnosis Date   Allergy    Atrial fibrillation (HCC)    CAD (coronary artery disease) 08/2009   BMS to RCA, non-obstructive in CX and LAD 2011   Cataract    CHF (congestive heart failure) (HCC)    Cirrhosis (HCC)    Dysrhythmia    A. Fib   Edema, peripheral    Heart murmur    Hypertension    Myocardial infarction [facility])    Nausea    Pneumonia    January 2020   Sleep apnea    No CPAP   Trichomoniasis 06/05/2018    Past Surgical History:  Procedure Laterality Date   BIOPSY  08/06/2020   Procedure: BIOPSY;  Surgeon: Rachael Fee, MD;  Location: [facility];  Service: Gastroenterology;;   CATARACT EXTRACTION W/ INTRAOCULAR LENS IMPLANT Bilateral    CESAREAN SECTION     x 1. no BTL   CLIPPING OF ATRIAL APPENDAGE N/A 06/14/2023   Procedure: CLIPPING OF ATRIAL APPENDAGE USING 50 MM MEDTRONIC PENDITURE LAA CLIP;  Surgeon: Eugenio Hoes, MD;  Location: [facility];  Service: Open Heart Surgery;  Laterality: N/A;   COLONOSCOPY WITH PROPOFOL N/A 08/06/2020   Procedure: COLONOSCOPY WITH PROPOFOL;  Surgeon: Rachael Fee, MD;  Location: [facility];  Service: Gastroenterology;  Laterality: N/A;   CORONARY ANGIOPLASTY WITH STENT PLACEMENT  06/2010   DILATION AND CURETTAGE OF UTERUS  1982   For SAB   ESOPHAGOGASTRODUODENOSCOPY (EGD) WITH PROPOFOL  N/A 08/06/2020   Procedure: ESOPHAGOGASTRODUODENOSCOPY (EGD) WITH PROPOFOL;  Surgeon: Rachael Fee, MD;  Location: [facility];  Service: Gastroenterology;  Laterality: N/A;   HYSTEROSCOPY WITH D & C N/A 03/13/2019   Procedure: DILATATION AND CURETTAGE /HYSTEROSCOPY;  Surgeon: Screven Bing, MD;  Location: [facility];  Service: Gynecology;  Laterality: N/A;   MITRAL VALVE REPAIR N/A 06/14/2023   Procedure: MITRAL VALVE REPAIR USING 32 MM SMUFORM SEMI-RIGID ANNULOPLASTY RING;  Surgeon: Eugenio Hoes, MD;  Location: [facility];  Service: Open Heart Surgery;  Laterality: N/A;   POLYPECTOMY  08/06/2020   Procedure: POLYPECTOMY;  Surgeon: Rachael Fee, MD;  Location: [facility];  Service: Gastroenterology;;   RIGHT/LEFT HEART CATH AND CORONARY ANGIOGRAPHY Bilateral 03/31/2023   Procedure: RIGHT/LEFT HEART CATH AND CORONARY ANGIOGRAPHY;  Surgeon: Yvonne Kendall, MD;  Location: [facility];  Service: Cardiovascular;  Laterality: Bilateral;   TEE WITHOUT CARDIOVERSION N/A 03/08/2023   Procedure: TRANSESOPHAGEAL ECHOCARDIOGRAM;  Surgeon: Parke Poisson, MD;  [facility]: [facility];  Service: Cardiovascular;  Laterality: N/A;   TEE WITHOUT CARDIOVERSION N/A 06/14/2023   Procedure: TRANSESOPHAGEAL ECHOCARDIOGRAM;  Surgeon: Eugenio Hoes, MD;  Location:  [facility];  Service: Open Heart Surgery;  Laterality: N/A;   TRICUSPID VALVE REPLACEMENT N/A 06/14/2023   Procedure: TRICUSPID VALVE REPAIR USING 32 MM EDWARDS MC3 TRICUSPID ANNULOPLASTY RING;  Surgeon: Eugenio Hoes, MD;  Location: [facility];  Service: Open Heart Surgery;  Laterality: N/A;    Social History   Tobacco Use  Smoking Status Former   Current packs/day: 0.20   Types: Cigarettes  Smokeless Tobacco Never  Tobacco Comments   Quit smoking cigarettes 06/07/2023  was smoking 2 cigarettes a day  several months before surgery    Social History   Substance and Sexual Activity  Alcohol Use Not Currently    Social  History   Socioeconomic History   Marital status: Widowed    Spouse name: Not on file   Number of children: 3   Years of education: Not on file   Highest education level: Not on file  Occupational History   Not on file  Tobacco Use   Smoking status: Former    Current packs/day: 0.20    Types: Cigarettes   Smokeless tobacco: Never   Tobacco comments:    Quit smoking cigarettes 06/07/2023  was smoking 2 cigarettes a day  several months before surgery  Vaping Use   Vaping status: Never Used  Substance and Sexual Activity   Alcohol use: Not Currently   Drug use: Yes    Types: Marijuana    Comment: nightly   Sexual activity: Yes    Birth control/protection: Post-menopausal  Other Topics Concern   Not on file  Social History Narrative   Not on file   Social Drivers of Health   Financial Resource Strain: Not on File (09/23/2021)   Received from [employer], [employer]    Financial Resource Strain: 0  Food Insecurity: No Food Insecurity (06/14/2023)   Hunger Vital Sign    Worried About Running Out of Food in the Last Year: Never true    Ran Out of Food in the Last Year: Never true  Transportation Needs: No Transportation Needs (06/14/2023)   PRAPARE - Administrator, Civil Service (Medical): No    Lack of Transportation (Non-Medical): No  Physical Activity: Not on File (09/23/2021)   Received from [place], [place]   Physical Activity    Physical Activity: 0  Stress: Not on File (09/23/2021)   Received from [facility], [place]   Stress    Stress: 0  Social Connections: Moderately Integrated (06/14/2023)   Social Connection and Isolation Panel [NHANES]    Frequency of Communication with Friends and Family: More than three times a week    Frequency of Social Gatherings with Friends and Family: Once a week    Attends Religious Services: More than 4 times per year    Active Member of [employer] or Organizations: Yes    Attends Banker Meetings: More than 4  times per year    Marital Status: Widowed  Intimate Partner Violence: Not At Risk (06/14/2023)   Humiliation, Afraid, Rape, and Kick questionnaire    Fear of Current or Ex-Partner: No    Emotionally Abused: No    Physically Abused: No    Sexually Abused: No    Allergies  Allergen Reactions   Ace Inhibitors Cough   Prednisone Itching and Swelling    Itching on leg and arms swelling from knee to ankles and felt restless    Current Outpatient Medications  Medication Sig Dispense Refill   acetaminophen (TYLENOL) 500 MG  tablet Take 500 mg by mouth every 6 (six) hours as needed.     albuterol (PROVENTIL HFA;VENTOLIN HFA) 108 (90 Base) MCG/ACT inhaler Inhale 2 puffs into the lungs every 6 (six) hours as needed for wheezing or shortness of breath. 1 Inhaler 2   atorvastatin (LIPITOR) 20 MG tablet Take 1 tablet (20 mg total) by mouth every evening. 90 tablet 3   dextromethorphan-guaiFENesin (MUCINEX DM) 30-600 MG 12hr tablet Take 1 tablet by mouth 2 (two) times daily. 30 tablet 0   digoxin (LANOXIN) 0.125 MG tablet Take 0.5 tablets (0.0625 mg total) by mouth daily. 45 tablet 3   furosemide (LASIX) 40 MG tablet Take 40 mg by mouth daily.     Homeopathic Products [facility] MENTAL FOCUS PO) Take 2 capsules by mouth in the morning. Amare MentaFocus     metoprolol tartrate (LOPRESSOR) 25 MG tablet Take 1 tablet (25 mg total) by mouth 2 (two) times daily. 180 tablet 2   Multiple Vitamins-Iron (MULTIVITAMINS WITH IRON) TABS tablet Take 1 tablet by mouth daily.     NON FORMULARY Take 2 capsules by mouth in the morning. Mood+ by amare GLOBAL     OVER THE COUNTER MEDICATION Take 1 packet by mouth daily. Amare Edge+ Grape Plant-Based Nootropics     pantoprazole (PROTONIX) 40 MG tablet Take 1 tablet (40 mg total) by mouth every evening.     Probiotic Product (PROBIOTIC PO) Take 1 packet by mouth daily. MentaBiotics Advance Gut Brain Nutrition Prebiotic/Probiotic/Phytobiotics     spironolactone (ALDACTONE)  100 MG tablet Take 100 mg by mouth daily.     warfarin (COUMADIN) 1 MG tablet Take 0.5 tablets (0.5 mg total) by mouth one time daily at 4 PM. 60 tablet 1   No current facility-administered medications for this visit.     Family History  Problem Relation Age of Onset   CAD Mother    Diabetes Mellitus II Father    Colon cancer Father    Hypertension Sister    Breast cancer Paternal Aunt 86   CAD Maternal Grandmother    Kidney disease Son        Physical Exam: Lungs: clear Card: RR without murmur Ext: chronic swelling Neuro: intact     Diagnostic Studies & Laboratory data: I have personally reviewed the following studies and agree with the findings     Recent Radiology Findings:       Recent Lab Findings: Lab Results  Component Value Date   WBC 9.2 06/21/2023   HGB 9.7 (L) 06/21/2023   HCT 29.9 (L) 06/21/2023   PLT 191 06/21/2023   GLUCOSE 100 (H) 07/06/2023   CHOL 142 11/28/2022   TRIG 70 11/28/2022   HDL 47 11/28/2022   LDLCALC 81 11/28/2022   ALT 13 06/12/2023   AST 19 06/12/2023   NA 142 07/06/2023   K 5.1 07/06/2023   CL 97 07/06/2023   CREATININE 0.79 07/06/2023   BUN 16 07/06/2023   CO2 31 (H) 07/06/2023   INR 1.1 07/13/2023   HGBA1C 6.2 (H) 06/12/2023      Assessment / Plan:     SP MV and TV repair. She is doing very well. Will leave diuretic therapy to cardiology. Have changed her back to her Eliquis. She will have echo soon. Restrictions reviewed. She took out garbage last night and we discussed need to not lift over 10 lbs for next two weeks   I have spent 30 min in review of the records, viewing studies and in  face to face with patient and in coordination of future care    Eugenio Hoes 07/20/2023 8:52 AM

## 2023-07-20 NOTE — Telephone Encounter (Signed)
..     Pre-operative Risk Assessment    Patient Name: Rhonda [name]  DOB: 1956-02-04 MRN: [id number]   Date of last office visit: 07/06/23 Date of next office visit: 07/31/23  Request for Surgical Clearance    Procedure:   CLEANING, RADIOGRAPHS AND FILLING AND CROWNS  Date of Surgery:  Clearance TBD                                Surgeon:  DR [place] THOMAS Surgeon's Group or Practice Name:  [facility] Phone number:  [phone] Fax number:  [phone]   Type of Clearance Requested:   - Medical  - Pharmacy:  Hold Warfarin (Coumadin)     Type of Anesthesia:  Local    Additional requests/questions:    Jola Babinski   07/20/2023, 9:27 AM

## 2023-07-24 ENCOUNTER — Encounter: Payer: Medicare Other | Attending: Internal Medicine

## 2023-07-24 VITALS — Ht 64.0 in | Wt 169.6 lb

## 2023-07-24 DIAGNOSIS — Z9889 Other specified postprocedural states: Secondary | ICD-10-CM | POA: Diagnosis present

## 2023-07-24 NOTE — Progress Notes (Signed)
Cardiac Individual Treatment Plan  Patient Details  Name: Rhonda [name] MRN: [id number] Date of Birth: [birth year] Referring Provider:   Flowsheet Row Cardiac Rehab from 07/24/2023 in [facility] Cardiac and Pulmonary Rehab  Referring Provider Dr. Weston Brass, MD       Initial Encounter Date:  Flowsheet Row Cardiac Rehab from 07/24/2023 in [facility] Cardiac and Pulmonary Rehab  Date 07/24/23       Visit Diagnosis: S/P MVR (mitral valve repair)  S/P tricuspid valve repair  Patient's Home Medications on Admission:  Current Outpatient Medications:    acetaminophen (TYLENOL) 500 MG tablet, Take 500 mg by mouth every 6 (six) hours as needed., Disp: , Rfl:    albuterol (PROVENTIL HFA;VENTOLIN HFA) 108 (90 Base) MCG/ACT inhaler, Inhale 2 puffs into the lungs every 6 (six) hours as needed for wheezing or shortness of breath., Disp: 1 Inhaler, Rfl: 2   apixaban (ELIQUIS) 5 MG TABS tablet, Take 1 tablet (5 mg total) by mouth 2 (two) times daily., Disp: 60 tablet, Rfl: 1   atorvastatin (LIPITOR) 20 MG tablet, Take 1 tablet (20 mg total) by mouth every evening., Disp: 90 tablet, Rfl: 3   digoxin (LANOXIN) 0.125 MG tablet, Take 0.5 tablets (0.0625 mg total) by mouth daily., Disp: 45 tablet, Rfl: 3   furosemide (LASIX) 40 MG tablet, Take 40 mg by mouth daily., Disp: , Rfl:    Homeopathic Products [facility] MENTAL FOCUS PO), Take 2 capsules by mouth in the morning. Amare MentaFocus, Disp: , Rfl:    metoprolol tartrate (LOPRESSOR) 25 MG tablet, Take 1 tablet (25 mg total) by mouth 2 (two) times daily., Disp: 180 tablet, Rfl: 2   Multiple Vitamins-Iron (MULTIVITAMINS WITH IRON) TABS tablet, Take 1 tablet by mouth daily., Disp: , Rfl:    NON FORMULARY, Take 2 capsules by mouth in the morning. Mood+ by [employer], Disp: , Rfl:    OVER THE COUNTER MEDICATION, Take 1 packet by mouth daily. Amare Edge+ Grape Plant-Based Nootropics, Disp: , Rfl:    pantoprazole (PROTONIX) 40 MG tablet, Take 1 tablet (40  mg total) by mouth every evening., Disp: , Rfl:    Probiotic Product (PROBIOTIC PO), Take 1 packet by mouth daily. MentaBiotics Advance Gut Brain Nutrition Prebiotic/Probiotic/Phytobiotics, Disp: , Rfl:    spironolactone (ALDACTONE) 100 MG tablet, Take 100 mg by mouth daily., Disp: , Rfl:   Past Medical History: Past Medical History:  Diagnosis Date   Allergy    Atrial fibrillation (HCC)    CAD (coronary artery disease) 08/2009   BMS to RCA, non-obstructive in CX and LAD 2011   Cataract    CHF (congestive heart failure) (HCC)    Cirrhosis (HCC)    Dysrhythmia    A. Fib   Edema, peripheral    Heart murmur    Hypertension    Myocardial infarction [facility])    Nausea    Pneumonia    January 2020   Sleep apnea    No CPAP   Trichomoniasis 06/05/2018    Tobacco Use: Social History   Tobacco Use  Smoking Status Former   Current packs/day: 0.20   Types: Cigarettes  Smokeless Tobacco Never  Tobacco Comments   Quit smoking cigarettes 06/07/2023  was smoking 2 cigarettes a day  several months before surgery    Labs: Review Flowsheet  More data exists      Latest Ref Rng & Units 03/31/2023 06/12/2023 06/14/2023 06/15/2023 06/16/2023  Labs for ITP Cardiac and Pulmonary Rehab  Hemoglobin A1c 4.8 -  5.6 % - 6.2  - - -  PH, Arterial 7.35 - 7.45 7.361  - 7.255  7.233  7.200  7.326  7.414  7.370  7.446  7.301  7.269  7.281  7.259  7.433  7.237  7.281   PCO2 arterial 32 - 48 mmHg 46.4  - 56.1  64.3  65.4  49.2  41.7  47.4  37.9  56.5  54.7  56.2  54.0  34.7  58.4  55.7   Bicarbonate 20.0 - 28.0 mmol/L 26.3  27.8  - 25.1  27.3  25.7  25.9  26.7  27.4  26.4  26.1  27.8  25.1  26.5  24.1  23.1  24.7  26.2   TCO2 22 - 32 mmol/L 28  29  - 27  29  28  27  28  27  29  30  28  27  27  30  29  27  28  26  24  26  28    Acid-base deficit 0.0 - 2.0 mmol/L - - 3.0  1.0  4.0  1.0  2.0  1.0  3.0  1.0  3.0  1.0   O2 Saturation % 89  63  - 94  93  93  97  100  100  80  100  100  93  95  93  94  93  93      Details       Multiple values from one day are sorted in reverse-chronological order          Exercise Target Goals: Exercise Program Goal: Individual exercise prescription set using results from initial 6 min walk test and THRR while considering  patient's activity barriers and safety.   Exercise Prescription Goal: Initial exercise prescription builds to 30-45 minutes a day of aerobic activity, 2-3 days per week.  Home exercise guidelines will be given to patient during program as part of exercise prescription that the participant will acknowledge.   Education: Aerobic Exercise: - Group verbal and visual presentation on the components of exercise prescription. Introduces F.I.T.T principle from ACSM for exercise prescriptions.  Reviews F.I.T.T. principles of aerobic exercise including progression. Written material given at graduation. Flowsheet Row Cardiac Rehab from 07/24/2023 in [facility] Cardiac and Pulmonary Rehab  Education need identified 07/24/23       Education: Resistance Exercise: - Group verbal and visual presentation on the components of exercise prescription. Introduces F.I.T.T principle from ACSM for exercise prescriptions  Reviews F.I.T.T. principles of resistance exercise including progression. Written material given at graduation.    Education: Exercise & Equipment Safety: - Individual verbal instruction and demonstration of equipment use and safety with use of the equipment. Flowsheet Row Cardiac Rehab from 07/24/2023 in [facility] Cardiac and Pulmonary Rehab  Date 07/24/23  Educator NT  Instruction Review Code 1- Verbalizes Understanding       Education: Exercise Physiology & General Exercise Guidelines: - Group verbal and written instruction with models to review the exercise physiology of the cardiovascular system and associated critical values. Provides general exercise guidelines with specific guidelines to those with heart or lung disease.    Education:  Flexibility, Balance, Mind/Body Relaxation: - Group verbal and visual presentation with interactive activity on the components of exercise prescription. Introduces F.I.T.T principle from ACSM for exercise prescriptions. Reviews F.I.T.T. principles of flexibility and balance exercise training including progression. Also discusses the mind body connection.  Reviews various relaxation techniques to help reduce and manage stress (i.e. Deep breathing,  progressive muscle relaxation, and visualization). Balance handout provided to take home. Written material given at graduation.   Activity Barriers & Risk Stratification:  Activity Barriers & Cardiac Risk Stratification - 07/24/23 1556       Activity Barriers & Cardiac Risk Stratification   Activity Barriers Joint Problems   R knee pain   Cardiac Risk Stratification Moderate             6 Minute Walk:  6 Minute Walk     Row Name 07/24/23 1557         6 Minute Walk   Phase Initial     Distance 1125 feet     Walk Time 6 minutes     # of Rest Breaks 0     MPH 2.13     METS 2.6     RPE 9     Perceived Dyspnea  0     VO2 Peak 9.09     Symptoms No     Resting HR 72 bpm     Resting BP 106/60     Resting Oxygen Saturation  97 %     Exercise Oxygen Saturation  during 6 min walk 95 %     Max Ex. HR 95 bpm     Max Ex. BP 120/64     2 Minute Post BP 116/62              Oxygen Initial Assessment:  Oxygen Initial Assessment - 07/03/23 1437       Home Oxygen   Home Oxygen Device Home Concentrator;E-Tanks    Sleep Oxygen Prescription Continuous    Liters per minute 3    Home Exercise Oxygen Prescription Continuous    Liters per minute 3    Home Resting Oxygen Prescription Continuous    Liters per minute 3    Compliance with Home Oxygen Use Yes      Intervention   Short Term Goals To learn and exhibit compliance with exercise, home and travel O2 prescription;To learn and understand importance of monitoring SPO2 with pulse  oximeter and demonstrate accurate use of the pulse oximeter.;To learn and understand importance of maintaining oxygen saturations>88%;To learn and demonstrate proper pursed lip breathing techniques or other breathing techniques. ;To learn and demonstrate proper use of respiratory medications    Long  Term Goals Exhibits compliance with exercise, home  and travel O2 prescription;Verbalizes importance of monitoring SPO2 with pulse oximeter and return demonstration;Maintenance of O2 saturations>88%;Exhibits proper breathing techniques, such as pursed lip breathing or other method taught during program session;Compliance with respiratory medication;Demonstrates proper use of MDI's             Oxygen Re-Evaluation:   Oxygen Discharge (Final Oxygen Re-Evaluation):   Initial Exercise Prescription:  Initial Exercise Prescription - 07/24/23 1600       Date of Initial Exercise RX and Referring Provider   Date 07/24/23    Referring Provider Dr. Weston Brass, MD      Oxygen   Maintain Oxygen Saturation 88% or higher      Treadmill   MPH 2.1    Grade 0    Minutes 15    METs 2.61      Recumbant Bike   Level 2    RPM 50    Watts 15    Minutes 15    METs 2.6      NuStep   Level 2    SPM 80    Minutes 15    METs  2.6      Prescription Details   Frequency (times per week) 3    Duration Progress to 30 minutes of continuous aerobic without signs/symptoms of physical distress      Intensity   THRR 40-80% of Max Heartrate 105-138    Ratings of Perceived Exertion 11-13    Perceived Dyspnea 0-4      Progression   Progression Continue to progress workloads to maintain intensity without signs/symptoms of physical distress.      Resistance Training   Training Prescription Yes    Weight 4 lb    Reps 10-15             Perform Capillary Blood Glucose checks as needed.  Exercise Prescription Changes:   Exercise Prescription Changes     Row Name 07/24/23 1600              Response to Exercise   Blood Pressure (Admit) 106/60       Blood Pressure (Exercise) 120/64       Blood Pressure (Exit) 116/62       Heart Rate (Admit) 72 bpm       Heart Rate (Exercise) 95 bpm       Heart Rate (Exit) 81 bpm       Oxygen Saturation (Admit) 97 %       Oxygen Saturation (Exercise) 95 %       Rating of Perceived Exertion (Exercise) 9       Perceived Dyspnea (Exercise) 0       Symptoms none       Comments Results                Exercise Comments:   Exercise Goals and Review:   Exercise Goals     Row Name 07/24/23 1208             Exercise Goals   Increase Physical Activity Yes       Intervention Develop an individualized exercise prescription for aerobic and resistive training based on initial evaluation findings, risk stratification, comorbidities and participant's personal goals.;Provide advice, education, support and counseling about physical activity/exercise needs.       Expected Outcomes Long Term: Exercising regularly at least 3-5 days a week.;Long Term: Add in home exercise to make exercise part of routine and to increase amount of physical activity.;Short Term: Attend rehab on a regular basis to increase amount of physical activity.       Increase Strength and Stamina Yes       Intervention Provide advice, education, support and counseling about physical activity/exercise needs.;Develop an individualized exercise prescription for aerobic and resistive training based on initial evaluation findings, risk stratification, comorbidities and participant's personal goals.       Expected Outcomes Short Term: Increase workloads from initial exercise prescription for resistance, speed, and METs.;Short Term: Perform resistance training exercises routinely during rehab and add in resistance training at home;Long Term: Improve cardiorespiratory fitness, muscular endurance and strength as measured by increased METs and functional capacity ( )       Able  to understand and use rate of perceived exertion (RPE) scale Yes       Intervention Provide education and explanation on how to use RPE scale       Expected Outcomes Short Term: Able to use RPE daily in rehab to express subjective intensity level;Long Term:  Able to use RPE to guide intensity level when exercising independently       Able to understand and  use Dyspnea scale Yes       Intervention Provide education and explanation on how to use Dyspnea scale       Expected Outcomes Short Term: Able to use Dyspnea scale daily in rehab to express subjective sense of shortness of breath during exertion;Long Term: Able to use Dyspnea scale to guide intensity level when exercising independently       Knowledge and understanding of Target Heart Rate Range (THRR) Yes       Intervention Provide education and explanation of THRR including how the numbers were predicted and where they are located for reference       Expected Outcomes Short Term: Able to state/look up THRR;Long Term: Able to use THRR to govern intensity when exercising independently;Short Term: Able to use daily as guideline for intensity in rehab       Able to check pulse independently Yes       Intervention Provide education and demonstration on how to check pulse in carotid and radial arteries.;Review the importance of being able to check your own pulse for safety during independent exercise       Expected Outcomes Short Term: Able to explain why pulse checking is important during independent exercise;Long Term: Able to check pulse independently and accurately       Understanding of Exercise Prescription Yes       Intervention Provide education, explanation, and written materials on patient's individual exercise prescription       Expected Outcomes Short Term: Able to explain program exercise prescription;Long Term: Able to explain home exercise prescription to exercise independently                Exercise Goals Re-Evaluation  :   Discharge Exercise Prescription (Final Exercise Prescription Changes):  Exercise Prescription Changes - 07/24/23 1600       Response to Exercise   Blood Pressure (Admit) 106/60    Blood Pressure (Exercise) 120/64    Blood Pressure (Exit) 116/62    Heart Rate (Admit) 72 bpm    Heart Rate (Exercise) 95 bpm    Heart Rate (Exit) 81 bpm    Oxygen Saturation (Admit) 97 %    Oxygen Saturation (Exercise) 95 %    Rating of Perceived Exertion (Exercise) 9    Perceived Dyspnea (Exercise) 0    Symptoms none    Comments Results             Nutrition:  Target Goals: Understanding of nutrition guidelines, daily intake of sodium 1500mg , cholesterol 200mg , calories 30% from fat and 7% or less from saturated fats, daily to have 5 or more servings of fruits and vegetables.  Education: All About Nutrition: -Group instruction provided by verbal, written material, interactive activities, discussions, models, and posters to present general guidelines for heart healthy nutrition including fat, fiber, MyPlate, the role of sodium in heart healthy nutrition, utilization of the nutrition label, and utilization of this knowledge for meal planning. Follow up email sent as well. Written material given at graduation. Flowsheet Row Cardiac Rehab from 07/24/2023 in [facility] Cardiac and Pulmonary Rehab  Education need identified 07/24/23       Biometrics:  Pre Biometrics - 07/24/23 1556       Pre Biometrics   Height 5\' 4"  (1.626 m)    Weight 169 lb 9.6 oz (76.9 kg)    Waist Circumference 35 inches    Hip Circumference 37 inches    Waist to Hip Ratio 0.95 %    BMI (Calculated) 29.1  Single Leg Stand 8.4 seconds              Nutrition Therapy Plan and Nutrition Goals:  Nutrition Therapy & Goals - 07/24/23 1351       Nutrition Therapy   Diet Cardiac, Low Na    Protein (specify units) 70-90g    Fiber 25 grams    Whole Grain Foods 3 servings    Saturated Fats 15 max. grams     Fruits and Vegetables 5 servings/day    Sodium 2 grams      Personal Nutrition Goals   Nutrition Goal Eat 15-30gProtein and 30-60gCarbs at each meal.    Personal Goal #2 Read labels and reduce sodium intake to below 2300mg . Ideally 1500mg  per day.    Personal Goal #3 Reduce saturated fat, less than 12g per day. Replace bad fats for more heart healthy fats.    Comments Patient drinking 20oz of diet coke, cup of coffee most mornings. She usually eats breakfast, but then wont eat lunch on the days she ate breakfast. She likes salads and often does them for dinner. Reviewed mediterranean diet handout, educated on types of fats, sources, and how to read labels. She reports she reads labels often, was apart of weight watchers for many years. She doesn't salt her foods and tries to keep sodium intake low. Provided her with goal of less than 1500mg  sodium daily. Encouraged her to eat 3 times per day use small protein snack paired with a complex carb. She likes cottage cheese and peaches.      Intervention Plan   Intervention Prescribe, educate and counsel regarding individualized specific dietary modifications aiming towards targeted core components such as weight, hypertension, lipid management, diabetes, heart failure and other comorbidities.;Nutrition handout(s) given to patient.    Expected Outcomes Short Term Goal: Understand basic principles of dietary content, such as calories, fat, sodium, cholesterol and nutrients.;Short Term Goal: A plan has been developed with personal nutrition goals set during dietitian appointment.;Long Term Goal: Adherence to prescribed nutrition plan.             Nutrition Assessments:  MEDIFICTS Score Key: >=70 Need to make dietary changes  40-70 Heart Healthy Diet <= 40 Therapeutic Level Cholesterol Diet  Flowsheet Row Cardiac Rehab from 07/24/2023 in [facility] Cardiac and Pulmonary Rehab  Picture Your Plate Total Score on Admission 71      Picture Your Plate  Scores: <40 Unhealthy dietary pattern with much room for improvement. 41-50 Dietary pattern unlikely to meet recommendations for good health and room for improvement. 51-60 More healthful dietary pattern, with some room for improvement.  >60 Healthy dietary pattern, although there may be some specific behaviors that could be improved.    Nutrition Goals Re-Evaluation:   Nutrition Goals Discharge (Final Nutrition Goals Re-Evaluation):   Psychosocial: Target Goals: Acknowledge presence or absence of significant depression and/or stress, maximize coping skills, provide positive support system. Participant is able to verbalize types and ability to use techniques and skills needed for reducing stress and depression.   Education: Stress, Anxiety, and Depression - Group verbal and visual presentation to define topics covered.  Reviews how body is impacted by stress, anxiety, and depression.  Also discusses healthy ways to reduce stress and to treat/manage anxiety and depression.  Written material given at graduation.   Education: Sleep Hygiene -Provides group verbal and written instruction about how sleep can affect your health.  Define sleep hygiene, discuss sleep cycles and impact of sleep habits. Review good sleep hygiene  tips.    Initial Review & Psychosocial Screening:  Initial Psych Review & Screening - 07/03/23 1428       Initial Review   Current issues with None Identified      Family Dynamics   Good Support System? Yes   daughter, neighbor, church family     Barriers   Psychosocial barriers to participate in program There are no identifiable barriers or psychosocial needs.      Screening Interventions   Interventions To provide support and resources with identified psychosocial needs;Provide feedback about the scores to participant;Encouraged to exercise    Expected Outcomes Short Term goal: Utilizing psychosocial counselor, staff and physician to assist with identification  of specific Stressors or current issues interfering with healing process. Setting desired goal for each stressor or current issue identified.;Long Term Goal: Stressors or current issues are controlled or eliminated.;Short Term goal: Identification and review with participant of any Quality of Life or Depression concerns found by scoring the questionnaire.;Long Term goal: The participant improves quality of Life and PHQ9 Scores as seen by post scores and/or verbalization of changes             Quality of Life Scores:   Quality of Life - 07/24/23 1206       Quality of Life   Select Quality of Life      Quality of Life Scores   Health/Function Pre 25.43 %    Socioeconomic Pre 23.31 %    Psych/Spiritual Pre 29.64 %    Family Pre 25.3 %    GLOBAL Pre 25.77 %            Scores of 19 and below usually indicate a poorer quality of life in these areas.  A difference of  2-3 points is a clinically meaningful difference.  A difference of 2-3 points in the total score of the Quality of Life Index has been associated with significant improvement in overall quality of life, self-image, physical symptoms, and general health in studies assessing change in quality of life.  PHQ-9: Review Flowsheet       07/24/2023  Depression screen PHQ 2/9  Decreased Interest 0  Down, Depressed, Hopeless 0  PHQ - 2 Score 0  Altered sleeping 0  Tired, decreased energy 0  Change in appetite 0  Feeling bad or failure about yourself  0  Trouble concentrating 0  Moving slowly or fidgety/restless 0  Suicidal thoughts 0  PHQ-9 Score 0   Interpretation of Total Score  Total Score Depression Severity:  1-4 = Minimal depression, 5-9 = Mild depression, 10-14 = Moderate depression, 15-19 = Moderately severe depression, 20-27 = Severe depression   Psychosocial Evaluation and Intervention:  Psychosocial Evaluation - 07/03/23 1439       Psychosocial Evaluation & Interventions   Interventions Encouraged to  exercise with the program and follow exercise prescription    Comments There are no barriers to her attending the program. She lives with a roommate in her condo. She has support from her daughter, neighbors and her church family.  She is ready to start the prgoram and work on building strength and stamina and continue healing from her surgery. She is wearing oxygen, was told it is short term, and she is ready to get rid of it as soon as her physician says it time .  She quit smoking right before her surgery, was down to 2 cigarettes a day.    Expected Outcomes STG attend all scheduled sessions, follow the exercise  progression. use her tobacco cessation resources to stay quit. LTG continue her exercise progression, stay quit with tobacco, use her [place] Quitline resource and MD if needed for help    Continue Psychosocial Services  Follow up required by staff             Psychosocial Re-Evaluation:   Psychosocial Discharge (Final Psychosocial Re-Evaluation):   Vocational Rehabilitation: Provide vocational rehab assistance to qualifying candidates.   Vocational Rehab Evaluation & Intervention:  Vocational Rehab - 07/03/23 1432       Initial Vocational Rehab Evaluation & Intervention   Assessment shows need for Vocational Rehabilitation No      Vocational Rehab Re-Evaulation   Comments no need for VR             Education: Education Goals: Education classes will be provided on a variety of topics geared toward better understanding of heart health and risk factor modification. Participant will state understanding/return demonstration of topics presented as noted by education test scores.  Learning Barriers/Preferences:   General Cardiac Education Topics:  AED/CPR: - Group verbal and written instruction with the use of models to demonstrate the basic use of the AED with the basic ABC's of resuscitation.   Anatomy and Cardiac Procedures: - Group verbal and visual presentation  and models provide information about basic cardiac anatomy and function. Reviews the testing methods done to diagnose heart disease and the outcomes of the test results. Describes the treatment choices: Medical Management, Angioplasty, or Coronary Bypass Surgery for treating various heart conditions including Myocardial Infarction, Angina, Valve Disease, and Cardiac Arrhythmias.  Written material given at graduation. Flowsheet Row Cardiac Rehab from 07/24/2023 in [facility] Cardiac and Pulmonary Rehab  Education need identified 07/24/23       Medication Safety: - Group verbal and visual instruction to review commonly prescribed medications for heart and lung disease. Reviews the medication, class of the drug, and side effects. Includes the steps to properly store meds and maintain the prescription regimen.  Written material given at graduation.   Intimacy: - Group verbal instruction through game format to discuss how heart and lung disease can affect sexual intimacy. Written material given at graduation..   Know Your Numbers and Heart Failure: - Group verbal and visual instruction to discuss disease risk factors for cardiac and pulmonary disease and treatment options.  Reviews associated critical values for Overweight/Obesity, Hypertension, Cholesterol, and Diabetes.  Discusses basics of heart failure: signs/symptoms and treatments.  Introduces Heart Failure Zone chart for action plan for heart failure.  Written material given at graduation.   Infection Prevention: - Provides verbal and written material to individual with discussion of infection control including proper hand washing and proper equipment cleaning during exercise session. Flowsheet Row Cardiac Rehab from 07/24/2023 in [facility] Cardiac and Pulmonary Rehab  Date 07/24/23  Educator NT  Instruction Review Code 1- Verbalizes Understanding       Falls Prevention: - Provides verbal and written material to individual with discussion of falls  prevention and safety. Flowsheet Row Cardiac Rehab from 07/24/2023 in [facility] Cardiac and Pulmonary Rehab  Date 07/03/23  Educator SB  Instruction Review Code 1- Verbalizes Understanding       Other: -Provides group and verbal instruction on various topics (see comments)   Knowledge Questionnaire Score:  Knowledge Questionnaire Score - 07/24/23 1206       Knowledge Questionnaire Score   Pre Score 22/26             Core Components/Risk Factors/Patient Goals at  Admission:  Personal Goals and Risk Factors at Admission - 07/03/23 1523       Core Components/Risk Factors/Patient Goals on Admission   Number of packs per day Lashaunda is a former tobacco user. Intervention for tobacco cessation was provided at the initial medical review. She was asked about readiness to quit and reported she quit right before her surgery, had weaned down to 2 cigarettes a day. . Patient was advised and educated about tobacco cessation using combination therapy, tobacco cessation classes, quit line, and quit smoking apps. Patient demonstrated understanding of this material. Staff will continue to provide encouragement and follow up with the patient throughout the program.             Education:Diabetes - Individual verbal and written instruction to review signs/symptoms of diabetes, desired ranges of glucose level fasting, after meals and with exercise. Acknowledge that pre and post exercise glucose checks will be done for 3 sessions at entry of program.   Core Components/Risk Factors/Patient Goals Review:    Core Components/Risk Factors/Patient Goals at Discharge (Final Review):    ITP Comments:  ITP Comments     Row Name 07/03/23 1524 07/24/23 1204         ITP Comments Virtual orientation call completed today. shehas an appointment on Date: not set until able to drive, she will call  for EP eval and gym Orientation.  Documentation of diagnosis can be found in [facility]  Date: 06/14/2023 .     Elia is a former tobacco user. Intervention for tobacco cessation was provided at the initial medical review. She was asked about readiness to quit and reported she quit right before her surgery, had weaned down to 2 cigarettes a day. . Patient was advised and educated about tobacco cessation using combination therapy, tobacco cessation classes, quit line, and quit smoking apps. Patient demonstrated understanding of this material. Staff will continue to provide encouragement and follow up with the patient throughout the program. Completed and gym orientation. Initial ITP created and sent for review to Dr. Bethann Punches, Medical Director.               Comments: Initial ITP

## 2023-07-24 NOTE — Progress Notes (Signed)
Assessment start time: 11:37 AM  Digestive issues/concerns: no known food allergies   24-hours Recall: B: oatmeal, whole grain toast with peanut butter L: nothing most days D: chicken, salad, baked potato  Beverages amari probiotic. Coffee with creamer,   Education r/t nutrition plan Patient drinking 20oz of diet coke, cup of coffee most mornings. She usually eats breakfast, but then wont eat lunch on the days she ate breakfast. She likes salads and often does them for dinner. Reviewed mediterranean diet handout, educated on types of fats, sources, and how to read labels. She reports she reads labels often, was apart of weight watchers for many years. She doesn't salt her foods and tries to keep sodium intake low. Provided her with goal of less than 1500mg  sodium daily. Encouraged her to eat 3 times per day use small protein snack paired with a complex carb. She likes cottage cheese and peaches.   Goal 1: Eat 15-30gProtein and 30-60gCarbs at each meal. Goal 2: Read labels and reduce sodium intake to below 2300mg . Ideally 1500mg  per day.  Goal 3: Reduce saturated fat, less than 12g per day. Replace bad fats for more heart healthy fats.   End time 12:08 PM

## 2023-07-24 NOTE — Patient Instructions (Addendum)
Patient Instructions  Patient Details  Name: Rhonda [name] MRN: [id number] Date of Birth: 30-Jan-1956 Referring Provider:  Parke Poisson, MD  Below are your personal goals for exercise, nutrition, and risk factors. Our goal is to help you stay on track towards obtaining and maintaining these goals. We will be discussing your progress on these goals with you throughout the program.  Initial Exercise Prescription:  Initial Exercise Prescription - 07/24/23 1600       Date of Initial Exercise RX and Referring Provider   Date 07/24/23    Referring Provider Dr. Weston Brass, MD      Oxygen   Maintain Oxygen Saturation 88% or higher      Treadmill   MPH 2.1    Grade 0    Minutes 15    METs 2.61      Recumbant Bike   Level 2    RPM 50    Watts 15    Minutes 15    METs 2.6      NuStep   Level 2    SPM 80    Minutes 15    METs 2.6      Prescription Details   Frequency (times per week) 3    Duration Progress to 30 minutes of continuous aerobic without signs/symptoms of physical distress      Intensity   THRR 40-80% of Max Heartrate 105-138    Ratings of Perceived Exertion 11-13    Perceived Dyspnea 0-4      Progression   Progression Continue to progress workloads to maintain intensity without signs/symptoms of physical distress.      Resistance Training   Training Prescription Yes    Weight 4 lb    Reps 10-15             Exercise Goals: Frequency: Be able to perform aerobic exercise two to three times per week in program working toward 2-5 days per week of home exercise.  Intensity: Work with a perceived exertion of 11 (fairly light) - 15 (hard) while following your exercise prescription.  We will make changes to your prescription with you as you progress through the program.   Duration: Be able to do 30 to 45 minutes of continuous aerobic exercise in addition to a 5 minute warm-up and a 5 minute cool-down routine.   Nutrition Goals: Your  personal nutrition goals will be established when you do your nutrition analysis with the dietician.  The following are general nutrition guidelines to follow: Cholesterol < 200mg /day Sodium < 1500mg /day Fiber: Women over 50 yrs - 21 grams per day  Personal Goals:  Personal Goals and Risk Factors at Admission - 07/03/23 1523       Core Components/Risk Factors/Patient Goals on Admission   Number of packs per day Zeenat is a former tobacco user. Intervention for tobacco cessation was provided at the initial medical review. She was asked about readiness to quit and reported she quit right before her surgery, had weaned down to 2 cigarettes a day. . Patient was advised and educated about tobacco cessation using combination therapy, tobacco cessation classes, quit line, and quit smoking apps. Patient demonstrated understanding of this material. Staff will continue to provide encouragement and follow up with the patient throughout the program.             Exercise Goals and Review:  Exercise Goals     Row Name 07/24/23 1208  Exercise Goals   Increase Physical Activity Yes       Intervention Develop an individualized exercise prescription for aerobic and resistive training based on initial evaluation findings, risk stratification, comorbidities and participant's personal goals.;Provide advice, education, support and counseling about physical activity/exercise needs.       Expected Outcomes Long Term: Exercising regularly at least 3-5 days a week.;Long Term: Add in home exercise to make exercise part of routine and to increase amount of physical activity.;Short Term: Attend rehab on a regular basis to increase amount of physical activity.       Increase Strength and Stamina Yes       Intervention Provide advice, education, support and counseling about physical activity/exercise needs.;Develop an individualized exercise prescription for aerobic and resistive training based on  initial evaluation findings, risk stratification, comorbidities and participant's personal goals.       Expected Outcomes Short Term: Increase workloads from initial exercise prescription for resistance, speed, and METs.;Short Term: Perform resistance training exercises routinely during rehab and add in resistance training at home;Long Term: Improve cardiorespiratory fitness, muscular endurance and strength as measured by increased METs and functional capacity ( )       Able to understand and use rate of perceived exertion (RPE) scale Yes       Intervention Provide education and explanation on how to use RPE scale       Expected Outcomes Short Term: Able to use RPE daily in rehab to express subjective intensity level;Long Term:  Able to use RPE to guide intensity level when exercising independently       Able to understand and use Dyspnea scale Yes       Intervention Provide education and explanation on how to use Dyspnea scale       Expected Outcomes Short Term: Able to use Dyspnea scale daily in rehab to express subjective sense of shortness of breath during exertion;Long Term: Able to use Dyspnea scale to guide intensity level when exercising independently       Knowledge and understanding of Target Heart Rate Range (THRR) Yes       Intervention Provide education and explanation of THRR including how the numbers were predicted and where they are located for reference       Expected Outcomes Short Term: Able to state/look up THRR;Long Term: Able to use THRR to govern intensity when exercising independently;Short Term: Able to use daily as guideline for intensity in rehab       Able to check pulse independently Yes       Intervention Provide education and demonstration on how to check pulse in carotid and radial arteries.;Review the importance of being able to check your own pulse for safety during independent exercise       Expected Outcomes Short Term: Able to explain why pulse checking is  important during independent exercise;Long Term: Able to check pulse independently and accurately       Understanding of Exercise Prescription Yes       Intervention Provide education, explanation, and written materials on patient's individual exercise prescription       Expected Outcomes Short Term: Able to explain program exercise prescription;Long Term: Able to explain home exercise prescription to exercise independently

## 2023-07-31 ENCOUNTER — Ambulatory Visit: Payer: Medicare Other | Attending: Internal Medicine | Admitting: Internal Medicine

## 2023-07-31 ENCOUNTER — Encounter: Payer: Medicare Other | Admitting: *Deleted

## 2023-07-31 ENCOUNTER — Encounter: Payer: Self-pay | Admitting: Internal Medicine

## 2023-07-31 VITALS — BP 117/74 | HR 68 | Ht 61.0 in | Wt 168.0 lb

## 2023-07-31 DIAGNOSIS — I4891 Unspecified atrial fibrillation: Secondary | ICD-10-CM | POA: Diagnosis not present

## 2023-07-31 DIAGNOSIS — I10 Essential (primary) hypertension: Secondary | ICD-10-CM

## 2023-07-31 DIAGNOSIS — Z9889 Other specified postprocedural states: Secondary | ICD-10-CM

## 2023-07-31 DIAGNOSIS — Z5181 Encounter for therapeutic drug level monitoring: Secondary | ICD-10-CM

## 2023-07-31 DIAGNOSIS — I484 Atypical atrial flutter: Secondary | ICD-10-CM | POA: Diagnosis not present

## 2023-07-31 DIAGNOSIS — Z7901 Long term (current) use of anticoagulants: Secondary | ICD-10-CM

## 2023-07-31 DIAGNOSIS — D6869 Other thrombophilia: Secondary | ICD-10-CM

## 2023-07-31 NOTE — Progress Notes (Signed)
 Daily Session Note  Patient Details  Name: Rhonda [name] MRN: [id number] Date of Birth: 16-Jan-1956 Referring Provider:   Flowsheet Row Cardiac Rehab from 07/24/2023 in [facility] Cardiac and Pulmonary Rehab  Referring Provider Dr. Weston Brass, MD       Encounter Date: 07/31/2023  Check In:  Session Check In - 07/31/23 1114       Check-In   Supervising physician immediately available to respond to emergencies See telemetry face sheet for immediately available ER MD    Location [facility]-Cardiac & Pulmonary Rehab    Staff Present Cora Collum, RN, BSN, CCRP;Margaret Best, MS, Exercise Physiologist;Maxon Conetta BS, Exercise Physiologist;Kelly Cloretta Ned, ACSM CEP, Exercise Physiologist    Virtual Visit No    Medication changes reported     No    Fall or balance concerns reported    No    Warm-up and Cool-down Performed on first and last piece of equipment    Resistance Training Performed Yes    VAD Patient? No    PAD/SET Patient? No      Pain Assessment   Currently in Pain? No/denies                Social History   Tobacco Use  Smoking Status Former   Current packs/day: 0.20   Types: Cigarettes  Smokeless Tobacco Never  Tobacco Comments   Quit smoking cigarettes 06/07/2023  was smoking 2 cigarettes a day  several months before surgery    Goals Met:  Independence with exercise equipment Exercise tolerated well No report of concerns or symptoms today  Goals Unmet:  Not Applicable  Comments: Pt able to follow exercise prescription today without complaint.  Will continue to monitor for progression. First full day of exercise!  Patient was oriented to gym and equipment including functions, settings, policies, and procedures.  Patient's individual exercise prescription and treatment plan were reviewed.  All starting workloads were established based on the results of the 6 minute walk test done at initial orientation visit.  The plan for exercise progression was also  introduced and progression will be customized based on patient's performance and goals.    Dr. Bethann Punches is Medical Director for [facility] Cardiac Rehabilitation.  Dr. Vida Rigger is Medical Director for [facility] Pulmonary Rehabilitation.

## 2023-07-31 NOTE — Progress Notes (Signed)
 Cardiology Office Note:  .   Date:  07/31/2023  ID:  Dlynn, Ranes [birth year], MRN [id number] PCP: Valerie Roys, FNP  [facility] Providers Cardiologist:  Parke Poisson, MD    History of Present Illness: .   Rhonda [name] is a 68 y.o. female.  Discussed the use of AI scribe software for clinical note transcription with the patient, who gave verbal consent to proceed.  History of Present Illness   The patient, with a history of mitral and tricuspid valve annuloplasty, presents for a post-operative follow-up. She reports significant improvement in her overall health and well-being since the surgery. She was initially on oxygen therapy post-surgery due to fluid overload but has since weaned herself off, monitoring her oxygen saturation at home with a pulse oximeter. She reports maintaining oxygen saturation in the nineties even without the oxygen therapy.  The patient also mentions a difficult post-operative period where she struggled with breathing after extubation. She was close to being re-intubated but managed to recover with the support of her family.  In addition to her cardiac issues, the patient has a history of atrial fibrillation (AFib) and is currently on Eliquis for anticoagulation. She also has a history of liver cirrhosis and has been advised to abstain from alcohol.  The patient is currently on Lasix 40mg  daily for fluid management, which she reports is working well for her. She also mentions using a respiratory exercise device to keep her lungs strong.        ROS: negative except per HPI above.  Studies Reviewed: .        Results   DIAGNOSTIC EKG: Accelerated junctional rhythm (07/31/2023)     Risk Assessment/Calculations:    CHA2DS2-VASc Score = 5   This indicates a 7.2% annual risk of stroke. The patient's score is based upon: CHF History: 1 HTN History: 1 Diabetes History: 0 Stroke History: 0 Vascular Disease  History: 1 Age Score: 1 Gender Score: 1            Physical Exam:   VS:  BP 117/74   Pulse 68   Ht 5\' 1"  (1.549 m)   Wt 168 lb (76.2 kg)   SpO2 90%   BMI 31.74 kg/m    Wt Readings from Last 3 Encounters:  07/31/23 168 lb (76.2 kg)  07/24/23 169 lb 9.6 oz (76.9 kg)  07/20/23 166 lb (75.3 kg)     Physical Exam   CHEST: Lungs clear to auscultation. CARDIOVASCULAR: No heart murmurs.     GEN: Well nourished, well developed in no acute distress NECK: No JVD; No carotid bruits CARDIAC: RRR, no murmurs, rubs, gallops RESPIRATORY:  Clear to auscultation without rales, wheezing or rhonchi  ABDOMEN: Soft, non-tender, non-distended EXTREMITIES:  trace bilateral edema; No deformity   ASSESSMENT AND PLAN: .    Assessment & Plan Essential hypertension  Atrial fibrillation, unspecified type (HCC)  Atypical atrial flutter (HCC)  S/P MVR (mitral valve repair)  Long term (current) use of anticoagulants  Encounter for therapeutic drug monitoring  Secondary hypercoagulable state (HCC)  S/P TVR (tricuspid valve repair)   Assessment and Plan    Post Mitral and Tricuspid Valve Annuloplasty Patient reports significant improvement in symptoms and physical appearance. No longer requires oxygen therapy. -Schedule echocardiogram to assess ventricular function post valve repair. -Continue current medication regimen.  Atrial Fibrillation EKG shows accel junctional rhythm vs possibly sinus with low p wave voltage. Patient reports occasional elevated heart rate. Previously  felt to have permanent afib.  -Continue Eliquis for stroke prevention. -Check Digoxin level today. Continue 0.0625 mg daily if level normal.  -Continue to monitor EKG at each visit.  Liver Disease Patient abstaining from alcohol. -Continue current management.  General Health Maintenance -Check labs today to assess kidney function and potassium levels. -Follow-up appointment in three months.

## 2023-07-31 NOTE — Patient Instructions (Signed)
 Medication Instructions:  NO Changes *If you need a refill on your cardiac medications before your next appointment, please call your pharmacy*   Lab Work: BMET and Digoxin Level Today If you have labs (blood work) drawn today and your tests are completely normal, you will receive your results only by: MyChart Message (if you have MyChart) OR A paper copy in the mail If you have any lab test that is abnormal or we need to change your treatment, we will call you to review the results.   Testing/Procedures: None   Follow-Up: At [facility], you and your health needs are our priority.  As part of our continuing mission to provide you with exceptional heart care, we have created designated Provider Care Teams.  These Care Teams include your primary Cardiologist (physician) and Advanced Practice Providers (APPs -  Physician Assistants and Nurse Practitioners) who all work together to provide you with the care you need, when you need it.   Your next appointment:    10/27/2023 at 8:40 am  Provider:   Parke Poisson, MD

## 2023-08-01 LAB — BASIC METABOLIC PANEL
BUN/Creatinine Ratio: 18 (ref 12–28)
BUN: 19 mg/dL (ref 8–27)
CO2: 26 mmol/L (ref 20–29)
Calcium: 10.1 mg/dL (ref 8.7–10.3)
Chloride: 97 mmol/L (ref 96–106)
Creatinine, Ser: 1.06 mg/dL — ABNORMAL HIGH (ref 0.57–1.00)
Glucose: 105 mg/dL — ABNORMAL HIGH (ref 70–99)
Potassium: 4.9 mmol/L (ref 3.5–5.2)
Sodium: 139 mmol/L (ref 134–144)
eGFR: 58 mL/min/{1.73_m2} — ABNORMAL LOW (ref >59)

## 2023-08-01 LAB — DIGOXIN LEVEL: Digoxin, Serum: 0.5 ng/mL (ref 0.5–0.9)

## 2023-08-02 ENCOUNTER — Encounter: Payer: Self-pay | Admitting: *Deleted

## 2023-08-02 ENCOUNTER — Other Ambulatory Visit (HOSPITAL_COMMUNITY): Payer: Medicare Other

## 2023-08-02 ENCOUNTER — Encounter: Payer: Medicare Other | Admitting: *Deleted

## 2023-08-02 DIAGNOSIS — Z9889 Other specified postprocedural states: Secondary | ICD-10-CM

## 2023-08-02 NOTE — Progress Notes (Signed)
 Daily Session Note  Patient Details  Name: Jasime Westergren MRN: [id number] Date of Birth: 30-Oct-1955 Referring Provider:   Flowsheet Row Cardiac Rehab from 07/24/2023 in [facility] Cardiac and Pulmonary Rehab  Referring Provider Dr. Weston Brass, MD       Encounter Date: 08/02/2023  Check In:  Session Check In - 08/02/23 1129       Check-In   Supervising physician immediately available to respond to emergencies See telemetry face sheet for immediately available ER MD    Location [facility]-Cardiac & Pulmonary Rehab    Staff Present Susann Givens RN,BSN;Joseph [facility] Best, MS, Exercise Physiologist    Virtual Visit No    Medication changes reported     No    Fall or balance concerns reported    No    Warm-up and Cool-down Performed on first and last piece of equipment    Resistance Training Performed Yes    VAD Patient? No    PAD/SET Patient? No      Pain Assessment   Currently in Pain? No/denies                Social History   Tobacco Use  Smoking Status Former   Current packs/day: 0.20   Types: Cigarettes  Smokeless Tobacco Never  Tobacco Comments   Quit smoking cigarettes 06/07/2023  was smoking 2 cigarettes a day  several months before surgery    Goals Met:  Independence with exercise equipment Exercise tolerated well No report of concerns or symptoms today  Goals Unmet:  Not Applicable  Comments: Pt able to follow exercise prescription today without complaint.  Will continue to monitor for progression.    Dr. Bethann Punches is Medical Director for [facility] Cardiac Rehabilitation.  Dr. Vida Rigger is Medical Director for [facility] Pulmonary Rehabilitation.

## 2023-08-04 ENCOUNTER — Encounter: Payer: Medicare Other | Admitting: *Deleted

## 2023-08-04 DIAGNOSIS — Z9889 Other specified postprocedural states: Secondary | ICD-10-CM | POA: Diagnosis not present

## 2023-08-04 NOTE — Progress Notes (Signed)
 Daily Session Note  Patient Details  Name: Rhonda [name] MRN: [id number] Date of Birth: 1956/03/20 Referring Provider:   Flowsheet Row Cardiac Rehab from 07/24/2023 in [facility] Cardiac and Pulmonary Rehab  Referring Provider Dr. Weston Brass, MD       Encounter Date: 08/04/2023  Check In:  Session Check In - 08/04/23 1111       Check-In   Supervising physician immediately available to respond to emergencies See telemetry face sheet for immediately available ER MD    Location [facility]-Cardiac & Pulmonary Rehab    Staff Present Susann Givens RN,BSN;Joseph Weyman Pedro, [place], Exercise Physiologist    Virtual Visit No    Medication changes reported     No    Fall or balance concerns reported    No    Tobacco Cessation No Change    Current number of cigarettes/nicotine per day     0    Warm-up and Cool-down Performed on first and last piece of equipment    Resistance Training Performed Yes    VAD Patient? No    PAD/SET Patient? No      Pain Assessment   Currently in Pain? No/denies                Social History   Tobacco Use  Smoking Status Former   Current packs/day: 0.20   Types: Cigarettes  Smokeless Tobacco Never  Tobacco Comments   Quit smoking cigarettes 06/07/2023  was smoking 2 cigarettes a day  several months before surgery    Goals Met:  Independence with exercise equipment Exercise tolerated well No report of concerns or symptoms today Strength training completed today  Goals Unmet:  Not Applicable  Comments: Pt able to follow exercise prescription today without complaint.  Will continue to monitor for progression.    Dr. Bethann Punches is Medical Director for [facility] Cardiac Rehabilitation.  Dr. Vida Rigger is Medical Director for [facility] Pulmonary Rehabilitation.

## 2023-08-07 ENCOUNTER — Other Ambulatory Visit: Payer: Self-pay

## 2023-08-07 ENCOUNTER — Encounter: Payer: Medicare Other | Attending: Internal Medicine | Admitting: *Deleted

## 2023-08-07 DIAGNOSIS — Z48812 Encounter for surgical aftercare following surgery on the circulatory system: Secondary | ICD-10-CM | POA: Diagnosis not present

## 2023-08-07 DIAGNOSIS — Z9889 Other specified postprocedural states: Secondary | ICD-10-CM | POA: Insufficient documentation

## 2023-08-07 DIAGNOSIS — K7031 Alcoholic cirrhosis of liver with ascites: Secondary | ICD-10-CM

## 2023-08-07 NOTE — Progress Notes (Signed)
 Daily Session Note  Patient Details  Name: Rhonda [name] MRN: [id number] Date of Birth: 04-07-1956 Referring Provider:   Flowsheet Row Cardiac Rehab from 07/24/2023 in [facility] Cardiac and Pulmonary Rehab  Referring Provider Dr. Weston Brass, MD       Encounter Date: 08/07/2023  Check In:  Session Check In - 08/07/23 1120       Check-In   Supervising physician immediately available to respond to emergencies See telemetry face sheet for immediately available ER MD    Location [facility]-Cardiac & Pulmonary Rehab    Staff Present Susann Givens RN,BSN;Joseph [facility] RCP,RRT,BSRT;Margaret Best, MS, Exercise Physiologist;Kelly Cloretta Ned, ACSM CEP, Exercise Physiologist    Virtual Visit No    Medication changes reported     No    Fall or balance concerns reported    No    Warm-up and Cool-down Performed on first and last piece of equipment    Resistance Training Performed Yes    VAD Patient? No    PAD/SET Patient? No      Pain Assessment   Currently in Pain? No/denies                Social History   Tobacco Use  Smoking Status Former   Current packs/day: 0.20   Types: Cigarettes  Smokeless Tobacco Never  Tobacco Comments   Quit smoking cigarettes 06/07/2023  was smoking 2 cigarettes a day  several months before surgery    Goals Met:  Independence with exercise equipment Exercise tolerated well No report of concerns or symptoms today Strength training completed today  Goals Unmet:  Not Applicable  Comments: Pt able to follow exercise prescription today without complaint.  Will continue to monitor for progression.    Dr. Bethann Punches is Medical Director for [facility] Cardiac Rehabilitation.  Dr. Vida Rigger is Medical Director for [facility] Pulmonary Rehabilitation.

## 2023-08-09 ENCOUNTER — Encounter: Payer: Medicare Other | Admitting: *Deleted

## 2023-08-09 DIAGNOSIS — Z9889 Other specified postprocedural states: Secondary | ICD-10-CM | POA: Diagnosis not present

## 2023-08-09 NOTE — Progress Notes (Signed)
 Daily Session Note  Patient Details  Name: Rhonda [name] MRN: [id number] Date of Birth: 10/09/1955 Referring Provider:   Flowsheet Row Cardiac Rehab from 07/24/2023 in [facility] Cardiac and Pulmonary Rehab  Referring Provider Dr. Weston Brass, MD       Encounter Date: 08/09/2023  Check In:  Session Check In - 08/09/23 1119       Check-In   Supervising physician immediately available to respond to emergencies See telemetry face sheet for immediately available ER MD    Location [facility]-Cardiac & Pulmonary Rehab    Staff Present Cora Collum, RN, BSN, CCRP;Margaret Best, MS, Exercise Physiologist;Joseph Hood RCP,RRT,BSRT;Noah Tickle, BS, Exercise Physiologist    Virtual Visit No    Medication changes reported     No    Fall or balance concerns reported    No    Warm-up and Cool-down Performed on first and last piece of equipment    Resistance Training Performed Yes    VAD Patient? No    PAD/SET Patient? No      Pain Assessment   Currently in Pain? No/denies                Social History   Tobacco Use  Smoking Status Former   Current packs/day: 0.20   Types: Cigarettes  Smokeless Tobacco Never  Tobacco Comments   Quit smoking cigarettes 06/07/2023  was smoking 2 cigarettes a day  several months before surgery    Goals Met:  Independence with exercise equipment Exercise tolerated well No report of concerns or symptoms today  Goals Unmet:  Not Applicable  Comments: Pt able to follow exercise prescription today without complaint.  Will continue to monitor for progression.    Dr. Bethann Punches is Medical Director for [facility] Cardiac Rehabilitation.  Dr. Vida Rigger is Medical Director for [facility] Pulmonary Rehabilitation.

## 2023-08-14 ENCOUNTER — Encounter: Payer: Medicare Other | Admitting: *Deleted

## 2023-08-14 DIAGNOSIS — Z9889 Other specified postprocedural states: Secondary | ICD-10-CM | POA: Diagnosis not present

## 2023-08-14 NOTE — Progress Notes (Signed)
 Daily Session Note  Patient Details  Name: Rhonda [name] MRN: [id number] Date of Birth: Apr 27, 1956 Referring Provider:   Flowsheet Row Cardiac Rehab from 07/24/2023 in [facility] Cardiac and Pulmonary Rehab  Referring Provider Dr. Weston Brass, MD       Encounter Date: 08/14/2023  Check In:  Session Check In - 08/14/23 1140       Check-In   Supervising physician immediately available to respond to emergencies See telemetry face sheet for immediately available ER MD    Location [facility]-Cardiac & Pulmonary Rehab    Staff Present Cora Collum, RN, BSN, CCRP;Margaret Best, MS, Exercise Physiologist;Kelly Cloretta Ned, ACSM CEP, Exercise Physiologist;Meredith Jewel Baize RN,BSN    Virtual Visit No    Medication changes reported     No    Fall or balance concerns reported    No    Warm-up and Cool-down Performed on first and last piece of equipment    Resistance Training Performed Yes    VAD Patient? No    PAD/SET Patient? No      Pain Assessment   Currently in Pain? No/denies                Social History   Tobacco Use  Smoking Status Former   Current packs/day: 0.20   Types: Cigarettes  Smokeless Tobacco Never  Tobacco Comments   Quit smoking cigarettes 06/07/2023  was smoking 2 cigarettes a day  several months before surgery    Goals Met:  Independence with exercise equipment Exercise tolerated well No report of concerns or symptoms today  Goals Unmet:  Not Applicable  Comments: Pt able to follow exercise prescription today without complaint.  Will continue to monitor for progression.    Dr. Bethann Punches is Medical Director for [facility] Cardiac Rehabilitation.  Dr. Vida Rigger is Medical Director for [facility] Pulmonary Rehabilitation.

## 2023-08-16 ENCOUNTER — Encounter: Payer: Medicare Other | Admitting: *Deleted

## 2023-08-16 DIAGNOSIS — Z9889 Other specified postprocedural states: Secondary | ICD-10-CM

## 2023-08-16 NOTE — Progress Notes (Signed)
 Daily Session Note  Patient Details  Name: Rhonda [name] MRN: [id number] Date of Birth: 07-May-1956 Referring Provider:   Flowsheet Row Cardiac Rehab from 07/24/2023 in [facility] Cardiac and Pulmonary Rehab  Referring Provider Dr. Weston Brass, MD       Encounter Date: 08/16/2023  Check In:  Session Check In - 08/16/23 1200       Check-In   Supervising physician immediately available to respond to emergencies See telemetry face sheet for immediately available ER MD    Location [facility]-Cardiac & Pulmonary Rehab    Staff Present Cora Collum, RN, BSN, CCRP;Maxon Conetta BS, Exercise Physiologist;Joseph Hood RCP,RRT,BSRT;Noah Tickle, BS, Exercise Physiologist    Virtual Visit No    Medication changes reported     No    Fall or balance concerns reported    No    Warm-up and Cool-down Performed on first and last piece of equipment    Resistance Training Performed Yes    VAD Patient? No    PAD/SET Patient? No      Pain Assessment   Currently in Pain? No/denies                Social History   Tobacco Use  Smoking Status Former   Current packs/day: 0.20   Types: Cigarettes  Smokeless Tobacco Never  Tobacco Comments   Quit smoking cigarettes 06/07/2023  was smoking 2 cigarettes a day  several months before surgery    Goals Met:  Independence with exercise equipment Exercise tolerated well No report of concerns or symptoms today  Goals Unmet:  Not Applicable  Comments: Pt able to follow exercise prescription today without complaint.  Will continue to monitor for progression.    Dr. Bethann Punches is Medical Director for [facility] Cardiac Rehabilitation.  Dr. Vida Rigger is Medical Director for [facility] Pulmonary Rehabilitation.

## 2023-08-16 NOTE — Progress Notes (Signed)
 Cardiac Individual Treatment Plan  Patient Details  Name: Rhonda [name] MRN: [id number] Date of Birth: Oct 14, 1955 Referring Provider:   Flowsheet Row Cardiac Rehab from 07/24/2023 in [facility] Cardiac and Pulmonary Rehab  Referring Provider Dr. Weston Brass, MD       Initial Encounter Date:  Flowsheet Row Cardiac Rehab from 07/24/2023 in [facility] Cardiac and Pulmonary Rehab  Date 07/24/23       Visit Diagnosis: S/P MVR (mitral valve repair)  S/P tricuspid valve repair  Patient's Home Medications on Admission:  Current Outpatient Medications:    acetaminophen (TYLENOL) 500 MG tablet, Take 500 mg by mouth every 6 (six) hours as needed., Disp: , Rfl:    albuterol (PROVENTIL HFA;VENTOLIN HFA) 108 (90 Base) MCG/ACT inhaler, Inhale 2 puffs into the lungs every 6 (six) hours as needed for wheezing or shortness of breath., Disp: 1 Inhaler, Rfl: 2   apixaban (ELIQUIS) 5 MG TABS tablet, Take 1 tablet (5 mg total) by mouth 2 (two) times daily., Disp: 60 tablet, Rfl: 1   atorvastatin (LIPITOR) 20 MG tablet, Take 1 tablet (20 mg total) by mouth every evening., Disp: 90 tablet, Rfl: 3   digoxin (LANOXIN) 0.125 MG tablet, Take 0.5 tablets (0.0625 mg total) by mouth daily., Disp: 45 tablet, Rfl: 3   furosemide (LASIX) 40 MG tablet, Take 40 mg by mouth daily., Disp: , Rfl:    Homeopathic Products [facility] MENTAL FOCUS PO), Take 2 capsules by mouth in the morning. Am[employer] MentaFocus, Disp: , Rfl:    metoprolol tartrate (LOPRESSOR) 25 MG tablet, Take 1 tablet (25 mg total) by mouth 2 (two) times daily., Disp: 180 tablet, Rfl: 2   Multiple Vitamins-Iron (MULTIVITAMINS WITH IRON) TABS tablet, Take 1 tablet by mouth daily., Disp: , Rfl:    NON FORMULARY, Take 2 capsules by mouth in the morning. Mood+ by [employer], Disp: , Rfl:    OVER THE COUNTER MEDICATION, Take 1 packet by mouth daily. Am[employer] Edge+ Grape Plant-Based Nootropics, Disp: , Rfl:    pantoprazole (PROTONIX) 40 MG tablet, Take 1 tablet (40  mg total) by mouth every evening., Disp: , Rfl:    Probiotic Product (PROBIOTIC PO), Take 1 packet by mouth daily. MentaBiotics Advance Gut Brain Nutrition Prebiotic/Probiotic/Phytobiotics, Disp: , Rfl:    spironolactone (ALDACTONE) 100 MG tablet, Take 100 mg by mouth daily., Disp: , Rfl:   Past Medical History: Past Medical History:  Diagnosis Date   Allergy    Atrial fibrillation (HCC)    CAD (coronary artery disease) 08/2009   BMS to RCA, non-obstructive in CX and LAD 2011   Cataract    CHF (congestive heart failure) (HCC)    Cirrhosis (HCC)    Dysrhythmia    A. Fib   Edema, peripheral    Heart murmur    Hypertension    Myocardial infarction [facility])    Nausea    Pneumonia    January 2020   Sleep apnea    No CPAP   Trichomoniasis 06/05/2018    Tobacco Use: Social History   Tobacco Use  Smoking Status Former   Current packs/day: 0.20   Types: Cigarettes  Smokeless Tobacco Never  Tobacco Comments   Quit smoking cigarettes 06/07/2023  was smoking 2 cigarettes a day  several months before surgery    Labs: Review Flowsheet  More data exists      Latest Ref Rng & Units 03/31/2023 06/12/2023 06/14/2023 06/15/2023 06/16/2023  Labs for ITP Cardiac and Pulmonary Rehab  Hemoglobin A1c 4.8 -  5.6 % - 6.2  - - -  PH, Arterial 7.35 - 7.45 7.361  - 7.255  7.233  7.200  7.326  7.414  7.370  7.446  7.301  7.269  7.281  7.259  7.433  7.237  7.281   PCO2 arterial 32 - 48 mmHg 46.4  - 56.1  64.3  65.4  49.2  41.7  47.4  37.9  56.5  54.7  56.2  54.0  34.7  58.4  55.7   Bicarbonate 20.0 - 28.0 mmol/L 26.3  27.8  - 25.1  27.3  25.7  25.9  26.7  27.4  26.4  26.1  27.8  25.1  26.5  24.1  23.1  24.7  26.2   TCO2 22 - 32 mmol/L 28  29  - 27  29  28  27  28  27  29  30  28  27  27  30  29  27  28  26  24  26  28    Acid-base deficit 0.0 - 2.0 mmol/L - - 3.0  1.0  4.0  1.0  2.0  1.0  3.0  1.0  3.0  1.0   O2 Saturation % 89  63  - 94  93  93  97  100  100  80  100  100  93  95  93  94  93  93      Details       Multiple values from one day are sorted in reverse-chronological order          Exercise Target Goals: Exercise Program Goal: Individual exercise prescription set using results from initial 6 min walk test and THRR while considering  patient's activity barriers and safety.   Exercise Prescription Goal: Initial exercise prescription builds to 30-45 minutes a day of aerobic activity, 2-3 days per week.  Home exercise guidelines will be given to patient during program as part of exercise prescription that the participant will acknowledge.   Education: Aerobic Exercise: - Group verbal and visual presentation on the components of exercise prescription. Introduces F.I.T.T principle from ACSM for exercise prescriptions.  Reviews F.I.T.T. principles of aerobic exercise including progression. Written material given at graduation. Flowsheet Row Cardiac Rehab from 07/24/2023 in [facility] Cardiac and Pulmonary Rehab  Education need identified 07/24/23       Education: Resistance Exercise: - Group verbal and visual presentation on the components of exercise prescription. Introduces F.I.T.T principle from ACSM for exercise prescriptions  Reviews F.I.T.T. principles of resistance exercise including progression. Written material given at graduation.    Education: Exercise & Equipment Safety: - Individual verbal instruction and demonstration of equipment use and safety with use of the equipment. Flowsheet Row Cardiac Rehab from 07/24/2023 in [facility] Cardiac and Pulmonary Rehab  Date 07/24/23  Educator NT  Instruction Review Code 1- Verbalizes Understanding       Education: Exercise Physiology & General Exercise Guidelines: - Group verbal and written instruction with models to review the exercise physiology of the cardiovascular system and associated critical values. Provides general exercise guidelines with specific guidelines to those with heart or lung disease.    Education:  Flexibility, Balance, Mind/Body Relaxation: - Group verbal and visual presentation with interactive activity on the components of exercise prescription. Introduces F.I.T.T principle from ACSM for exercise prescriptions. Reviews F.I.T.T. principles of flexibility and balance exercise training including progression. Also discusses the mind body connection.  Reviews various relaxation techniques to help reduce and manage stress (i.e. Deep breathing,  progressive muscle relaxation, and visualization). Balance handout provided to take home. Written material given at graduation.   Activity Barriers & Risk Stratification:  Activity Barriers & Cardiac Risk Stratification - 07/24/23 1556       Activity Barriers & Cardiac Risk Stratification   Activity Barriers Joint Problems   R knee pain   Cardiac Risk Stratification Moderate             6 Minute Walk:  6 Minute Walk     Row Name 07/24/23 1557         6 Minute Walk   Phase Initial     Distance 1125 feet     Walk Time 6 minutes     # of Rest Breaks 0     MPH 2.13     METS 2.6     RPE 9     Perceived Dyspnea  0     VO2 Peak 9.09     Symptoms No     Resting HR 72 bpm     Resting BP 106/60     Resting Oxygen Saturation  97 %     Exercise Oxygen Saturation  during 6 min walk 95 %     Max Ex. HR 95 bpm     Max Ex. BP 120/64     2 Minute Post BP 116/62              Oxygen Initial Assessment:  Oxygen Initial Assessment - 07/03/23 1437       Home Oxygen   Home Oxygen Device Home Concentrator;E-Tanks    Sleep Oxygen Prescription Continuous    Liters per minute 3    Home Exercise Oxygen Prescription Continuous    Liters per minute 3    Home Resting Oxygen Prescription Continuous    Liters per minute 3    Compliance with Home Oxygen Use Yes      Intervention   Short Term Goals To learn and exhibit compliance with exercise, home and travel O2 prescription;To learn and understand importance of monitoring SPO2 with pulse  oximeter and demonstrate accurate use of the pulse oximeter.;To learn and understand importance of maintaining oxygen saturations>88%;To learn and demonstrate proper pursed lip breathing techniques or other breathing techniques. ;To learn and demonstrate proper use of respiratory medications    Long  Term Goals Exhibits compliance with exercise, home  and travel O2 prescription;Verbalizes importance of monitoring SPO2 with pulse oximeter and return demonstration;Maintenance of O2 saturations>88%;Exhibits proper breathing techniques, such as pursed lip breathing or other method taught during program session;Compliance with respiratory medication;Demonstrates proper use of MDI's             Oxygen Re-Evaluation:   Oxygen Discharge (Final Oxygen Re-Evaluation):   Initial Exercise Prescription:  Initial Exercise Prescription - 07/24/23 1600       Date of Initial Exercise RX and Referring Provider   Date 07/24/23    Referring Provider Dr. Weston Brass, MD      Oxygen   Maintain Oxygen Saturation 88% or higher      Treadmill   MPH 2.1    Grade 0    Minutes 15    METs 2.61      Recumbant Bike   Level 2    RPM 50    Watts 15    Minutes 15    METs 2.6      NuStep   Level 2    SPM 80    Minutes 15    METs  2.6      Prescription Details   Frequency (times per week) 3    Duration Progress to 30 minutes of continuous aerobic without signs/symptoms of physical distress      Intensity   THRR 40-80% of Max Heartrate 105-138    Ratings of Perceived Exertion 11-13    Perceived Dyspnea 0-4      Progression   Progression Continue to progress workloads to maintain intensity without signs/symptoms of physical distress.      Resistance Training   Training Prescription Yes    Weight 4 lb    Reps 10-15             Perform Capillary Blood Glucose checks as needed.  Exercise Prescription Changes:   Exercise Prescription Changes     Row Name 07/24/23 1600 08/08/23 1400            Response to Exercise   Blood Pressure (Admit) 106/60 104/64      Blood Pressure (Exercise) 120/64 144/70      Blood Pressure (Exit) 116/62 98/60      Heart Rate (Admit) 72 bpm 71 bpm      Heart Rate (Exercise) 95 bpm 140 bpm      Heart Rate (Exit) 81 bpm 71 bpm      Oxygen Saturation (Admit) 97 % --      Oxygen Saturation (Exercise) 95 % --      Rating of Perceived Exertion (Exercise) 9 11      Perceived Dyspnea (Exercise) 0 --      Symptoms none none      Comments Results First three days of exercise      Duration -- Continue with 30 min of aerobic exercise without signs/symptoms of physical distress.      Intensity -- THRR unchanged        Progression   Progression -- Continue to progress workloads to maintain intensity without signs/symptoms of physical distress.      Average METs -- 2.52        Resistance Training   Training Prescription -- Yes      Weight -- 4 lb      Reps -- 10-15        Interval Training   Interval Training -- No        Treadmill   MPH -- 2.2      Grade -- 0      Minutes -- 15      METs -- 2.69        NuStep   Level -- 4  T6: level 3      Minutes -- 15      METs -- 2.8  T6: 2.3 METs        Oxygen   Maintain Oxygen Saturation -- 88% or higher               Exercise Comments:   Exercise Comments     Row Name 07/31/23 1115           Exercise Comments First full day of exercise!  Patient was oriented to gym and equipment including functions, settings, policies, and procedures.  Patient's individual exercise prescription and treatment plan were reviewed.  All starting workloads were established based on the results of the 6 minute walk test done at initial orientation visit.  The plan for exercise progression was also introduced and progression will be customized based on patient's performance and goals.  Exercise Goals and Review:   Exercise Goals     Row Name 07/24/23 1208              Exercise Goals   Increase Physical Activity Yes       Intervention Develop an individualized exercise prescription for aerobic and resistive training based on initial evaluation findings, risk stratification, comorbidities and participant's personal goals.;Provide advice, education, support and counseling about physical activity/exercise needs.       Expected Outcomes Long Term: Exercising regularly at least 3-5 days a week.;Long Term: Add in home exercise to make exercise part of routine and to increase amount of physical activity.;Short Term: Attend rehab on a regular basis to increase amount of physical activity.       Increase Strength and Stamina Yes       Intervention Provide advice, education, support and counseling about physical activity/exercise needs.;Develop an individualized exercise prescription for aerobic and resistive training based on initial evaluation findings, risk stratification, comorbidities and participant's personal goals.       Expected Outcomes Short Term: Increase workloads from initial exercise prescription for resistance, speed, and METs.;Short Term: Perform resistance training exercises routinely during rehab and add in resistance training at home;Long Term: Improve cardiorespiratory fitness, muscular endurance and strength as measured by increased METs and functional capacity ( )       Able to understand and use rate of perceived exertion (RPE) scale Yes       Intervention Provide education and explanation on how to use RPE scale       Expected Outcomes Short Term: Able to use RPE daily in rehab to express subjective intensity level;Long Term:  Able to use RPE to guide intensity level when exercising independently       Able to understand and use Dyspnea scale Yes       Intervention Provide education and explanation on how to use Dyspnea scale       Expected Outcomes Short Term: Able to use Dyspnea scale daily in rehab to express subjective sense of shortness of breath  during exertion;Long Term: Able to use Dyspnea scale to guide intensity level when exercising independently       Knowledge and understanding of Target Heart Rate Range (THRR) Yes       Intervention Provide education and explanation of THRR including how the numbers were predicted and where they are located for reference       Expected Outcomes Short Term: Able to state/look up THRR;Long Term: Able to use THRR to govern intensity when exercising independently;Short Term: Able to use daily as guideline for intensity in rehab       Able to check pulse independently Yes       Intervention Provide education and demonstration on how to check pulse in carotid and radial arteries.;Review the importance of being able to check your own pulse for safety during independent exercise       Expected Outcomes Short Term: Able to explain why pulse checking is important during independent exercise;Long Term: Able to check pulse independently and accurately       Understanding of Exercise Prescription Yes       Intervention Provide education, explanation, and written materials on patient's individual exercise prescription       Expected Outcomes Short Term: Able to explain program exercise prescription;Long Term: Able to explain home exercise prescription to exercise independently                Exercise Goals Re-Evaluation :  Exercise Goals Re-Evaluation     Row Name 07/31/23 1115 08/08/23 1435           Exercise Goal Re-Evaluation   Exercise Goals Review Able to understand and use rate of perceived exertion (RPE) scale;Able to understand and use Dyspnea scale;Knowledge and understanding of Target Heart Rate Range (THRR);Understanding of Exercise Prescription Understanding of Exercise Prescription;Increase Physical Activity;Increase Strength and Stamina      Comments Reviewed RPE and dyspnea scale, THR and program prescription with pt today.  Pt voiced understanding and was given a copy of goals to take  home. Rhonda [name] is off to a good start in the program. She was able to walk the treadmill at a speed of 2.2 mph with no incline during her first week of rehab. She also did well working at level 3 on the T6 nustep and level 4 on the T4 nustep. We will continue to monitor her progress in the program.      Expected Outcomes Short: Use RPE daily to regulate intensity. Long: Follow program prescription in THR. Short: Continue to follow current exercise prescription. Long: Continue exercise to improve strength and stamina.               Discharge Exercise Prescription (Final Exercise Prescription Changes):  Exercise Prescription Changes - 08/08/23 1400       Response to Exercise   Blood Pressure (Admit) 104/64    Blood Pressure (Exercise) 144/70    Blood Pressure (Exit) 98/60    Heart Rate (Admit) 71 bpm    Heart Rate (Exercise) 140 bpm    Heart Rate (Exit) 71 bpm    Rating of Perceived Exertion (Exercise) 11    Symptoms none    Comments First three days of exercise    Duration Continue with 30 min of aerobic exercise without signs/symptoms of physical distress.    Intensity THRR unchanged      Progression   Progression Continue to progress workloads to maintain intensity without signs/symptoms of physical distress.    Average METs 2.52      Resistance Training   Training Prescription Yes    Weight 4 lb    Reps 10-15      Interval Training   Interval Training No      Treadmill   MPH 2.2    Grade 0    Minutes 15    METs 2.69      NuStep   Level 4   T6: level 3   Minutes 15    METs 2.8   T6: 2.3 METs     Oxygen   Maintain Oxygen Saturation 88% or higher             Nutrition:  Target Goals: Understanding of nutrition guidelines, daily intake of sodium 1500mg , cholesterol 200mg , calories 30% from fat and 7% or less from saturated fats, daily to have 5 or more servings of fruits and vegetables.  Education: All About Nutrition: -Group instruction provided by verbal,  written material, interactive activities, discussions, models, and posters to present general guidelines for heart healthy nutrition including fat, fiber, MyPlate, the role of sodium in heart healthy nutrition, utilization of the nutrition label, and utilization of this knowledge for meal planning. Follow up email sent as well. Written material given at graduation. Flowsheet Row Cardiac Rehab from 07/24/2023 in [facility] Cardiac and Pulmonary Rehab  Education need identified 07/24/23       Biometrics:  Pre Biometrics - 07/24/23 1556  Pre Biometrics   Height 5\' 4"  (1.626 m)    Weight 169 lb 9.6 oz (76.9 kg)    Waist Circumference 35 inches    Hip Circumference 37 inches    Waist to Hip Ratio 0.95 %    BMI (Calculated) 29.1    Single Leg Stand 8.4 seconds              Nutrition Therapy Plan and Nutrition Goals:  Nutrition Therapy & Goals - 07/24/23 1351       Nutrition Therapy   Diet Cardiac, Low Na    Protein (specify units) 70-90g    Fiber 25 grams    Whole Grain Foods 3 servings    Saturated Fats 15 max. grams    Fruits and Vegetables 5 servings/day    Sodium 2 grams      Personal Nutrition Goals   Nutrition Goal Eat 15-30gProtein and 30-60gCarbs at each meal.    Personal Goal #2 Read labels and reduce sodium intake to below 2300mg . Ideally 1500mg  per day.    Personal Goal #3 Reduce saturated fat, less than 12g per day. Replace bad fats for more heart healthy fats.    Comments Patient drinking 20oz of diet coke, cup of coffee most mornings. She usually eats breakfast, but then wont eat lunch on the days she ate breakfast. She likes salads and often does them for dinner. Reviewed mediterranean diet handout, educated on types of fats, sources, and how to read labels. She reports she reads labels often, was apart of weight watchers for many years. She doesn't salt her foods and tries to keep sodium intake low. Provided her with goal of less than 1500mg  sodium daily.  Encouraged her to eat 3 times per day use small protein snack paired with a complex carb. She likes cottage cheese and peaches.      Intervention Plan   Intervention Prescribe, educate and counsel regarding individualized specific dietary modifications aiming towards targeted core components such as weight, hypertension, lipid management, diabetes, heart failure and other comorbidities.;Nutrition handout(s) given to patient.    Expected Outcomes Short Term Goal: Understand basic principles of dietary content, such as calories, fat, sodium, cholesterol and nutrients.;Short Term Goal: A plan has been developed with personal nutrition goals set during dietitian appointment.;Long Term Goal: Adherence to prescribed nutrition plan.             Nutrition Assessments:  MEDIFICTS Score Key: >=70 Need to make dietary changes  40-70 Heart Healthy Diet <= 40 Therapeutic Level Cholesterol Diet  Flowsheet Row Cardiac Rehab from 07/24/2023 in [facility] Cardiac and Pulmonary Rehab  Picture Your Plate Total Score on Admission 71      Picture Your Plate Scores: <78 Unhealthy dietary pattern with much room for improvement. 41-50 Dietary pattern unlikely to meet recommendations for good health and room for improvement. 51-60 More healthful dietary pattern, with some room for improvement.  >60 Healthy dietary pattern, although there may be some specific behaviors that could be improved.    Nutrition Goals Re-Evaluation:   Nutrition Goals Discharge (Final Nutrition Goals Re-Evaluation):   Psychosocial: Target Goals: Acknowledge presence or absence of significant depression and/or stress, maximize coping skills, provide positive support system. Participant is able to verbalize types and ability to use techniques and skills needed for reducing stress and depression.   Education: Stress, Anxiety, and Depression - Group verbal and visual presentation to define topics covered.  Reviews how body is impacted  by stress, anxiety, and depression.  Also discusses  healthy ways to reduce stress and to treat/manage anxiety and depression.  Written material given at graduation.   Education: Sleep Hygiene -Provides group verbal and written instruction about how sleep can affect your health.  Define sleep hygiene, discuss sleep cycles and impact of sleep habits. Review good sleep hygiene tips.    Initial Review & Psychosocial Screening:  Initial Psych Review & Screening - 07/03/23 1428       Initial Review   Current issues with None Identified      Family Dynamics   Good Support System? Yes   daughter, neighbor, church family     Barriers   Psychosocial barriers to participate in program There are no identifiable barriers or psychosocial needs.      Screening Interventions   Interventions To provide support and resources with identified psychosocial needs;Provide feedback about the scores to participant;Encouraged to exercise    Expected Outcomes Short Term goal: Utilizing psychosocial counselor, staff and physician to assist with identification of specific Stressors or current issues interfering with healing process. Setting desired goal for each stressor or current issue identified.;Long Term Goal: Stressors or current issues are controlled or eliminated.;Short Term goal: Identification and review with participant of any Quality of Life or Depression concerns found by scoring the questionnaire.;Long Term goal: The participant improves quality of Life and PHQ9 Scores as seen by post scores and/or verbalization of changes             Quality of Life Scores:   Quality of Life - 07/24/23 1206       Quality of Life   Select Quality of Life      Quality of Life Scores   Health/Function Pre 25.43 %    Socioeconomic Pre 23.31 %    Psych/Spiritual Pre 29.64 %    Family Pre 25.3 %    GLOBAL Pre 25.77 %            Scores of 19 and below usually indicate a poorer quality of life in these  areas.  A difference of  2-3 points is a clinically meaningful difference.  A difference of 2-3 points in the total score of the Quality of Life Index has been associated with significant improvement in overall quality of life, self-image, physical symptoms, and general health in studies assessing change in quality of life.  PHQ-9: Review Flowsheet       07/24/2023  Depression screen PHQ 2/9  Decreased Interest 0  Down, Depressed, Hopeless 0  PHQ - 2 Score 0  Altered sleeping 0  Tired, decreased energy 0  Change in appetite 0  Feeling bad or failure about yourself  0  Trouble concentrating 0  Moving slowly or fidgety/restless 0  Suicidal thoughts 0  PHQ-9 Score 0   Interpretation of Total Score  Total Score Depression Severity:  1-4 = Minimal depression, 5-9 = Mild depression, 10-14 = Moderate depression, 15-19 = Moderately severe depression, 20-27 = Severe depression   Psychosocial Evaluation and Intervention:  Psychosocial Evaluation - 07/03/23 1439       Psychosocial Evaluation & Interventions   Interventions Encouraged to exercise with the program and follow exercise prescription    Comments There are no barriers to her attending the program. She lives with a roommate in her condo. She has support from her daughter, neighbors and her church family.  She is ready to start the prgoram and work on building strength and stamina and continue healing from her surgery. She is wearing oxygen, was told  it is short term, and she is ready to get rid of it as soon as her physician says it time .  She quit smoking right before her surgery, was down to 2 cigarettes a day.    Expected Outcomes STG attend all scheduled sessions, follow the exercise progression. use her tobacco cessation resources to stay quit. LTG continue her exercise progression, stay quit with tobacco, use her [place] Quitline resource and MD if needed for help    Continue Psychosocial Services  Follow up required by staff              Psychosocial Re-Evaluation:   Psychosocial Discharge (Final Psychosocial Re-Evaluation):   Vocational Rehabilitation: Provide vocational rehab assistance to qualifying candidates.   Vocational Rehab Evaluation & Intervention:  Vocational Rehab - 07/03/23 1432       Initial Vocational Rehab Evaluation & Intervention   Assessment shows need for Vocational Rehabilitation No      Vocational Rehab Re-Evaulation   Comments no need for VR             Education: Education Goals: Education classes will be provided on a variety of topics geared toward better understanding of heart health and risk factor modification. Participant will state understanding/return demonstration of topics presented as noted by education test scores.  Learning Barriers/Preferences:   General Cardiac Education Topics:  AED/CPR: - Group verbal and written instruction with the use of models to demonstrate the basic use of the AED with the basic ABC's of resuscitation.   Anatomy and Cardiac Procedures: - Group verbal and visual presentation and models provide information about basic cardiac anatomy and function. Reviews the testing methods done to diagnose heart disease and the outcomes of the test results. Describes the treatment choices: Medical Management, Angioplasty, or Coronary Bypass Surgery for treating various heart conditions including Myocardial Infarction, Angina, Valve Disease, and Cardiac Arrhythmias.  Written material given at graduation. Flowsheet Row Cardiac Rehab from 07/24/2023 in [facility] Cardiac and Pulmonary Rehab  Education need identified 07/24/23       Medication Safety: - Group verbal and visual instruction to review commonly prescribed medications for heart and lung disease. Reviews the medication, class of the drug, and side effects. Includes the steps to properly store meds and maintain the prescription regimen.  Written material given at graduation.   Intimacy: -  Group verbal instruction through game format to discuss how heart and lung disease can affect sexual intimacy. Written material given at graduation..   Know Your Numbers and Heart Failure: - Group verbal and visual instruction to discuss disease risk factors for cardiac and pulmonary disease and treatment options.  Reviews associated critical values for Overweight/Obesity, Hypertension, Cholesterol, and Diabetes.  Discusses basics of heart failure: signs/symptoms and treatments.  Introduces Heart Failure Zone chart for action plan for heart failure.  Written material given at graduation.   Infection Prevention: - Provides verbal and written material to individual with discussion of infection control including proper hand washing and proper equipment cleaning during exercise session. Flowsheet Row Cardiac Rehab from 07/24/2023 in [facility] Cardiac and Pulmonary Rehab  Date 07/24/23  Educator NT  Instruction Review Code 1- Verbalizes Understanding       Falls Prevention: - Provides verbal and written material to individual with discussion of falls prevention and safety. Flowsheet Row Cardiac Rehab from 07/24/2023 in [facility] Cardiac and Pulmonary Rehab  Date 07/03/23  Educator SB  Instruction Review Code 1- Verbalizes Understanding       Other: -Provides group and  verbal instruction on various topics (see comments)   Knowledge Questionnaire Score:  Knowledge Questionnaire Score - 07/24/23 1206       Knowledge Questionnaire Score   Pre Score 22/26             Core Components/Risk Factors/Patient Goals at Admission:  Personal Goals and Risk Factors at Admission - 07/03/23 1523       Core Components/Risk Factors/Patient Goals on Admission   Number of packs per day Rhonda [name] is a former tobacco user. Intervention for tobacco cessation was provided at the initial medical review. She was asked about readiness to quit and reported she quit right before her surgery, had weaned down to 2  cigarettes a day. . Patient was advised and educated about tobacco cessation using combination therapy, tobacco cessation classes, quit line, and quit smoking apps. Patient demonstrated understanding of this material. Staff will continue to provide encouragement and follow up with the patient throughout the program.             Education:Diabetes - Individual verbal and written instruction to review signs/symptoms of diabetes, desired ranges of glucose level fasting, after meals and with exercise. Acknowledge that pre and post exercise glucose checks will be done for 3 sessions at entry of program.   Core Components/Risk Factors/Patient Goals Review:    Core Components/Risk Factors/Patient Goals at Discharge (Final Review):    ITP Comments:  ITP Comments     Row Name 07/03/23 1524 07/24/23 1204 07/31/23 1114 08/16/23 0736     ITP Comments Virtual orientation call completed today. shehas an appointment on Date: not set until able to drive, she will call  for EP eval and gym Orientation.  Documentation of diagnosis can be found in [facility]  Date: 06/14/2023 .    Rhonda [name] is a former tobacco user. Intervention for tobacco cessation was provided at the initial medical review. She was asked about readiness to quit and reported she quit right before her surgery, had weaned down to 2 cigarettes a day. . Patient was advised and educated about tobacco cessation using combination therapy, tobacco cessation classes, quit line, and quit smoking apps. Patient demonstrated understanding of this material. Staff will continue to provide encouragement and follow up with the patient throughout the program. Completed and gym orientation. Initial ITP created and sent for review to Dr. Bethann Punches, Medical Director. First full day of exercise!  Patient was oriented to gym and equipment including functions, settings, policies, and procedures.  Patient's individual exercise prescription and treatment plan were  reviewed.  All starting workloads were established based on the results of the 6 minute walk test done at initial orientation visit.  The plan for exercise progression was also introduced and progression will be customized based on patient's performance and goals. 30 Day review completed. Medical Director ITP review done, changes made as directed, and signed approval by Medical Director. New to program.             Comments: 30 Day review completed. Medical Director ITP review done, changes made as directed, and signed approval by Medical Director. New to program.

## 2023-08-17 ENCOUNTER — Ambulatory Visit
Admission: RE | Admit: 2023-08-17 | Discharge: 2023-08-17 | Disposition: A | Discharge status: Home / Self Care | Source: Ambulatory Visit | Attending: Nurse Practitioner | Admitting: Nurse Practitioner

## 2023-08-17 DIAGNOSIS — K7031 Alcoholic cirrhosis of liver with ascites: Secondary | ICD-10-CM | POA: Diagnosis present

## 2023-08-17 MED ORDER — GADOBUTROL 1 MMOL/ML IV SOLN
7.0000 mL | Freq: Once | INTRAVENOUS | Status: AC | PRN
  Administered 2023-08-17: 7 mL via INTRAVENOUS

## 2023-08-18 ENCOUNTER — Other Ambulatory Visit: Payer: Self-pay | Admitting: Nurse Practitioner

## 2023-08-18 ENCOUNTER — Encounter: Payer: Medicare Other | Admitting: *Deleted

## 2023-08-18 DIAGNOSIS — K7031 Alcoholic cirrhosis of liver with ascites: Secondary | ICD-10-CM

## 2023-08-18 DIAGNOSIS — Z9889 Other specified postprocedural states: Secondary | ICD-10-CM

## 2023-08-18 NOTE — Progress Notes (Signed)
 Daily Session Note  Patient Details  Name: Rhonda [name] MRN: [id number] Date of Birth: 21-May-1956 Referring Provider:   Flowsheet Row Cardiac Rehab from 07/24/2023 in [facility] Cardiac and Pulmonary Rehab  Referring Provider Dr. Weston Brass, MD       Encounter Date: 08/18/2023  Check In:  Session Check In - 08/18/23 1120       Check-In   Supervising physician immediately available to respond to emergencies See telemetry face sheet for immediately available ER MD    Location [facility]-Cardiac & Pulmonary Rehab    Staff Present Cora Collum, RN, BSN, CCRP;Joseph Hood RCP,RRT,BSRT;Noah Tickle, [place], Exercise Physiologist    Virtual Visit No    Medication changes reported     No    Fall or balance concerns reported    No    Warm-up and Cool-down Performed on first and last piece of equipment    Resistance Training Performed Yes    VAD Patient? No    PAD/SET Patient? No      Pain Assessment   Currently in Pain? No/denies                Social History   Tobacco Use  Smoking Status Former   Current packs/day: 0.20   Types: Cigarettes  Smokeless Tobacco Never  Tobacco Comments   Quit smoking cigarettes 06/07/2023  was smoking 2 cigarettes a day  several months before surgery    Goals Met:  Independence with exercise equipment Exercise tolerated well No report of concerns or symptoms today  Goals Unmet:  Not Applicable  Comments: Pt able to follow exercise prescription today without complaint.  Will continue to monitor for progression.    Dr. Bethann Punches is Medical Director for [facility] Cardiac Rehabilitation.  Dr. Vida Rigger is Medical Director for [facility] Pulmonary Rehabilitation.

## 2023-08-21 ENCOUNTER — Encounter: Payer: Medicare Other | Admitting: *Deleted

## 2023-08-21 DIAGNOSIS — Z9889 Other specified postprocedural states: Secondary | ICD-10-CM | POA: Diagnosis not present

## 2023-08-21 NOTE — Progress Notes (Signed)
 Daily Session Note  Patient Details  Name: Rhonda [name] MRN: [id number] Date of Birth: May 07, 1956 Referring Provider:   Flowsheet Row Cardiac Rehab from 07/24/2023 in [facility] Cardiac and Pulmonary Rehab  Referring Provider Dr. Weston Brass, MD       Encounter Date: 08/21/2023  Check In:  Session Check In - 08/21/23 1138       Check-In   Supervising physician immediately available to respond to emergencies See telemetry face sheet for immediately available ER MD    Location [facility]-Cardiac & Pulmonary Rehab    Staff Present Cora Collum, RN, BSN, CCRP;Margaret Best, MS, Exercise Physiologist;Meredith Jewel Baize RN,BSN;Kelly Madilyn Fireman BS, ACSM CEP, Exercise Physiologist    Virtual Visit No    Medication changes reported     No    Fall or balance concerns reported    No    Warm-up and Cool-down Performed on first and last piece of equipment    Resistance Training Performed Yes    VAD Patient? No    PAD/SET Patient? No      Pain Assessment   Currently in Pain? No/denies                Social History   Tobacco Use  Smoking Status Former   Current packs/day: 0.20   Types: Cigarettes  Smokeless Tobacco Never  Tobacco Comments   Quit smoking cigarettes 06/07/2023  was smoking 2 cigarettes a day  several months before surgery    Goals Met:  Independence with exercise equipment Exercise tolerated well No report of concerns or symptoms today  Goals Unmet:  Not Applicable  Comments: Pt able to follow exercise prescription today without complaint.  Will continue to monitor for progression.    Dr. Bethann Punches is Medical Director for [facility] Cardiac Rehabilitation.  Dr. Vida Rigger is Medical Director for [facility] Pulmonary Rehabilitation.

## 2023-08-22 ENCOUNTER — Encounter (HOSPITAL_COMMUNITY): Payer: Self-pay

## 2023-08-22 ENCOUNTER — Ambulatory Visit (HOSPITAL_COMMUNITY): Payer: Medicare Other | Attending: Internal Medicine

## 2023-08-22 ENCOUNTER — Encounter (HOSPITAL_COMMUNITY): Payer: Self-pay | Admitting: Internal Medicine

## 2023-08-23 ENCOUNTER — Encounter: Payer: Medicare Other | Admitting: *Deleted

## 2023-08-23 DIAGNOSIS — Z9889 Other specified postprocedural states: Secondary | ICD-10-CM | POA: Diagnosis not present

## 2023-08-23 NOTE — Progress Notes (Signed)
 Daily Session Note  Patient Details  Name: Rhonda [name] MRN: [id number] Date of Birth: 08-Jun-1955 Referring Provider:   Flowsheet Row Cardiac Rehab from 07/24/2023 in [facility] Cardiac and Pulmonary Rehab  Referring Provider Dr. Weston Brass, MD       Encounter Date: 08/23/2023  Check In:  Session Check In - 08/23/23 1442       Check-In   Supervising physician immediately available to respond to emergencies See telemetry face sheet for immediately available ER MD    Location [facility]-Cardiac & Pulmonary Rehab    Staff Present Cora Collum, RN, BSN, CCRP;Joseph Hood RCP,RRT,BSRT;Maxon [place] BS, Exercise Physiologist;Noah Tickle, BS, Exercise Physiologist    Virtual Visit No    Medication changes reported     No    Fall or balance concerns reported    No    Warm-up and Cool-down Performed on first and last piece of equipment    Resistance Training Performed Yes    VAD Patient? No    PAD/SET Patient? No      Pain Assessment   Currently in Pain? No/denies                Social History   Tobacco Use  Smoking Status Former   Current packs/day: 0.20   Types: Cigarettes  Smokeless Tobacco Never  Tobacco Comments   Quit smoking cigarettes 06/07/2023  was smoking 2 cigarettes a day  several months before surgery    Goals Met:  Independence with exercise equipment Exercise tolerated well No report of concerns or symptoms today  Goals Unmet:  Not Applicable  Comments: Pt able to follow exercise prescription today without complaint.  Will continue to monitor for progression.    Dr. Bethann Punches is Medical Director for [facility] Cardiac Rehabilitation.  Dr. Vida Rigger is Medical Director for [facility] Pulmonary Rehabilitation.

## 2023-08-25 ENCOUNTER — Encounter: Payer: Medicare Other | Admitting: *Deleted

## 2023-08-25 DIAGNOSIS — Z9889 Other specified postprocedural states: Secondary | ICD-10-CM | POA: Diagnosis not present

## 2023-08-25 NOTE — Progress Notes (Signed)
 Daily Session Note  Patient Details  Name: Rhonda [name] MRN: [id number] Date of Birth: 1956/05/02 Referring Provider:   Flowsheet Row Cardiac Rehab from 07/24/2023 in [facility] Cardiac and Pulmonary Rehab  Referring Provider Dr. Weston Brass, MD       Encounter Date: 08/25/2023  Check In:  Session Check In - 08/25/23 1228       Check-In   Supervising physician immediately available to respond to emergencies See telemetry face sheet for immediately available ER MD    Location [facility]-Cardiac & Pulmonary Rehab    Staff Present Rory Percy, MS, Exercise Physiologist;Jalecia Leon, RN, BSN, CCRP;Joseph Hood RCP,RRT,BSRT    Virtual Visit No    Medication changes reported     No    Fall or balance concerns reported    No    Warm-up and Cool-down Performed on first and last piece of equipment    Resistance Training Performed Yes    VAD Patient? No    PAD/SET Patient? No      Pain Assessment   Currently in Pain? No/denies                Social History   Tobacco Use  Smoking Status Former   Current packs/day: 0.20   Types: Cigarettes  Smokeless Tobacco Never  Tobacco Comments   Quit smoking cigarettes 06/07/2023  was smoking 2 cigarettes a day  several months before surgery    Goals Met:  Independence with exercise equipment Exercise tolerated well No report of concerns or symptoms today  Goals Unmet:  Not Applicable  Comments: Pt able to follow exercise prescription today without complaint.  Will continue to monitor for progression.    Dr. Bethann Punches is Medical Director for [facility] Cardiac Rehabilitation.  Dr. Vida Rigger is Medical Director for [facility] Pulmonary Rehabilitation.

## 2023-08-28 ENCOUNTER — Other Ambulatory Visit: Payer: Self-pay

## 2023-08-28 ENCOUNTER — Encounter: Payer: Medicare Other | Admitting: *Deleted

## 2023-08-28 ENCOUNTER — Telehealth: Payer: Self-pay

## 2023-08-28 DIAGNOSIS — Z9889 Other specified postprocedural states: Secondary | ICD-10-CM | POA: Diagnosis not present

## 2023-08-28 DIAGNOSIS — K7031 Alcoholic cirrhosis of liver with ascites: Secondary | ICD-10-CM

## 2023-08-28 NOTE — Telephone Encounter (Signed)
-----   Message from Nurse Joselyn Glassman sent at 02/27/2023  9:21 AM EDT -----  Personal reminder sent 02/27/2023/ Alcide Evener NP  AFP 6 month recall

## 2023-08-28 NOTE — Progress Notes (Signed)
 Daily Session Note  Patient Details  Name: Rhonda [name] MRN: [id number] Date of Birth: [birth year] Referring Provider:   Flowsheet Row Cardiac Rehab from 07/24/2023 in [facility] Cardiac and Pulmonary Rehab  Referring Provider Dr. Weston Brass, MD       Encounter Date: 08/28/2023  Check In:  Session Check In - 08/28/23 1648       Check-In   Supervising physician immediately available to respond to emergencies See telemetry face sheet for immediately available ER MD    Location [facility]-Cardiac & Pulmonary Rehab    Staff Present Rory Percy, MS, Exercise Physiologist;Maxon Suzzette Righter, Exercise Physiologist;Kelly Cloretta Ned, ACSM CEP, Exercise Physiologist;Meredith Jewel Baize RN,BSN;Charina Fons, RN, BSN, CCRP    Virtual Visit No    Medication changes reported     No    Fall or balance concerns reported    No    Warm-up and Cool-down Performed on first and last piece of equipment    Resistance Training Performed Yes    VAD Patient? No    PAD/SET Patient? No      Pain Assessment   Currently in Pain? No/denies                Social History   Tobacco Use  Smoking Status Former   Current packs/day: 0.20   Types: Cigarettes  Smokeless Tobacco Never  Tobacco Comments   Quit smoking cigarettes 06/07/2023  was smoking 2 cigarettes a day  several months before surgery    Goals Met:  Independence with exercise equipment Exercise tolerated well No report of concerns or symptoms today  Goals Unmet:  Not Applicable  Comments: Pt able to follow exercise prescription today without complaint.  Will continue to monitor for progression.    Dr. Bethann Punches is Medical Director for [facility] Cardiac Rehabilitation.  Dr. Vida Rigger is Medical Director for [facility] Pulmonary Rehabilitation.

## 2023-08-28 NOTE — Telephone Encounter (Signed)
 Reminder received in Epic. Pt made aware. Order for lab placed in Epic. Pt made aware.  Pt verbalized understanding with all questions answered.

## 2023-08-30 ENCOUNTER — Encounter: Payer: Medicare Other | Admitting: *Deleted

## 2023-08-30 DIAGNOSIS — Z9889 Other specified postprocedural states: Secondary | ICD-10-CM

## 2023-08-30 NOTE — Progress Notes (Signed)
 Daily Session Note  Patient Details  Name: Rhonda [name] MRN: [id number] Date of Birth: [birth year] Referring Provider:   Flowsheet Row Cardiac Rehab from 07/24/2023 in [facility] Cardiac and Pulmonary Rehab  Referring Provider Dr. Weston Brass, MD       Encounter Date: 08/30/2023  Check In:  Session Check In - 08/30/23 1158       Check-In   Supervising physician immediately available to respond to emergencies See telemetry face sheet for immediately available ER MD    Location [facility]-Cardiac & Pulmonary Rehab    Staff Present Maxon Conetta BS, Exercise Physiologist;Noah Tickle, BS, Exercise Physiologist;Juwana Thoreson, RN, BSN, CCRP;Joseph Hood RCP,RRT,BSRT;Jason Wallace Cullens RDN,LDN    Virtual Visit No    Medication changes reported     No    Fall or balance concerns reported    No    Warm-up and Cool-down Performed on first and last piece of equipment    Resistance Training Performed Yes    VAD Patient? No    PAD/SET Patient? No      Pain Assessment   Currently in Pain? No/denies                Social History   Tobacco Use  Smoking Status Former   Current packs/day: 0.20   Types: Cigarettes  Smokeless Tobacco Never  Tobacco Comments   Quit smoking cigarettes 06/07/2023  was smoking 2 cigarettes a day  several months before surgery    Goals Met:  Independence with exercise equipment Exercise tolerated well No report of concerns or symptoms today  Goals Unmet:  Not Applicable  Comments: Pt able to follow exercise prescription today without complaint.  Will continue to monitor for progression.    Dr. Bethann Punches is Medical Director for [facility] Cardiac Rehabilitation.  Dr. Vida Rigger is Medical Director for [facility] Pulmonary Rehabilitation.

## 2023-09-01 ENCOUNTER — Telehealth: Payer: Self-pay | Admitting: Internal Medicine

## 2023-09-01 DIAGNOSIS — Z2989 Encounter for other specified prophylactic measures: Secondary | ICD-10-CM

## 2023-09-01 NOTE — Telephone Encounter (Signed)
 Pts dental office calling in wanting to know if the pt would need if the pt would need Pre Meds before her dental treatment and if so for how long. Please advise

## 2023-09-04 ENCOUNTER — Encounter: Payer: Medicare Other | Admitting: *Deleted

## 2023-09-04 DIAGNOSIS — Z9889 Other specified postprocedural states: Secondary | ICD-10-CM

## 2023-09-04 NOTE — Progress Notes (Signed)
 Daily Session Note  Patient Details  Name: Rhonda [name] MRN: [id number] Date of Birth: 1956/05/03 Referring Provider:   Flowsheet Row Cardiac Rehab from 07/24/2023 in [facility] Cardiac and Pulmonary Rehab  Referring Provider Dr. Weston Brass, MD       Encounter Date: 09/04/2023  Check In:  Session Check In - 09/04/23 1144       Check-In   Supervising physician immediately available to respond to emergencies See telemetry face sheet for immediately available ER MD    Location [facility]-Cardiac & Pulmonary Rehab    Staff Present Cora Collum, RN, BSN, CCRP;Margaret Best, MS, Exercise Physiologist;Kelly Madilyn Fireman BS, ACSM CEP, Exercise Physiologist;Maxon Conetta BS, Exercise Physiologist    Virtual Visit No    Medication changes reported     No    Fall or balance concerns reported    No    Warm-up and Cool-down Performed on first and last piece of equipment    Resistance Training Performed Yes    VAD Patient? No    PAD/SET Patient? No                Social History   Tobacco Use  Smoking Status Former   Current packs/day: 0.20   Types: Cigarettes  Smokeless Tobacco Never  Tobacco Comments   Quit smoking cigarettes 06/07/2023  was smoking 2 cigarettes a day  several months before surgery    Goals Met:  Independence with exercise equipment Exercise tolerated well No report of concerns or symptoms today  Goals Unmet:  Not Applicable  Comments: Pt able to follow exercise prescription today without complaint.  Will continue to monitor for progression.    Dr. Bethann Punches is Medical Director for [facility] Cardiac Rehabilitation.  Dr. Vida Rigger is Medical Director for [facility] Pulmonary Rehabilitation.

## 2023-09-06 ENCOUNTER — Encounter: Payer: Medicare Other | Attending: Internal Medicine | Admitting: *Deleted

## 2023-09-06 ENCOUNTER — Other Ambulatory Visit

## 2023-09-06 DIAGNOSIS — Z9889 Other specified postprocedural states: Secondary | ICD-10-CM | POA: Insufficient documentation

## 2023-09-06 DIAGNOSIS — K7031 Alcoholic cirrhosis of liver with ascites: Secondary | ICD-10-CM

## 2023-09-06 NOTE — Progress Notes (Signed)
 Daily Session Note  Patient Details  Name: Rhonda [name] MRN: [id number] Date of Birth: 03-16-1956 Referring Provider:   Flowsheet Row Cardiac Rehab from 07/24/2023 in [facility] Cardiac and Pulmonary Rehab  Referring Provider Dr. Weston Brass, MD       Encounter Date: 09/06/2023  Check In:  Session Check In - 09/06/23 1118       Check-In   Supervising physician immediately available to respond to emergencies See telemetry face sheet for immediately available ER MD    Location [facility]-Cardiac & Pulmonary Rehab    Staff Present Maxon Conetta BS, Exercise Physiologist;Noah Tickle, BS, Exercise Physiologist;Jason Wallace Cullens RDN,LDN;Kimberely Mccannon, RN, BSN, CCRP;Joseph Hood RCP,RRT,BSRT    Virtual Visit No    Medication changes reported     No    Fall or balance concerns reported    No    Warm-up and Cool-down Performed on first and last piece of equipment    Resistance Training Performed Yes    VAD Patient? No    PAD/SET Patient? No      Pain Assessment   Currently in Pain? No/denies                Social History   Tobacco Use  Smoking Status Former   Current packs/day: 0.20   Types: Cigarettes  Smokeless Tobacco Never  Tobacco Comments   Quit smoking cigarettes 06/07/2023  was smoking 2 cigarettes a day  several months before surgery    Goals Met:  Independence with exercise equipment Exercise tolerated well No report of concerns or symptoms today  Goals Unmet:  Not Applicable  Comments: Pt able to follow exercise prescription today without complaint.  Will continue to monitor for progression.    Dr. Bethann Punches is Medical Director for [facility] Cardiac Rehabilitation.  Dr. Vida Rigger is Medical Director for [facility] Pulmonary Rehabilitation.

## 2023-09-07 LAB — AFP TUMOR MARKER: AFP-Tumor Marker: 1.4 ng/mL

## 2023-09-08 ENCOUNTER — Encounter: Payer: Medicare Other | Admitting: *Deleted

## 2023-09-08 DIAGNOSIS — Z9889 Other specified postprocedural states: Secondary | ICD-10-CM | POA: Diagnosis not present

## 2023-09-08 NOTE — Progress Notes (Signed)
 Daily Session Note  Patient Details  Name: Rhonda [name] MRN: [id number] Date of Birth: [birth year] Referring Provider:   Flowsheet Row Cardiac Rehab from 07/24/2023 in [facility] Cardiac and Pulmonary Rehab  Referring Provider Dr. Weston Brass, MD       Encounter Date: 09/08/2023  Check In:  Session Check In - 09/08/23 1115       Check-In   Supervising physician immediately available to respond to emergencies See telemetry face sheet for immediately available ER MD    Location [facility]-Cardiac & Pulmonary Rehab    Staff Present Rory Percy, MS, Exercise Physiologist;Maxon Conetta BS, Exercise Physiologist;Noah Tickle, BS, Exercise Physiologist;Macon Sandiford, RN, BSN, CCRP    Virtual Visit No    Medication changes reported     No    Fall or balance concerns reported    No    Warm-up and Cool-down Performed on first and last piece of equipment    Resistance Training Performed Yes    VAD Patient? No    PAD/SET Patient? No      Pain Assessment   Currently in Pain? No/denies                Social History   Tobacco Use  Smoking Status Former   Current packs/day: 0.20   Types: Cigarettes  Smokeless Tobacco Never  Tobacco Comments   Quit smoking cigarettes 06/07/2023  was smoking 2 cigarettes a day  several months before surgery    Goals Met:  Independence with exercise equipment Exercise tolerated well No report of concerns or symptoms today  Goals Unmet:  Not Applicable  Comments: Pt able to follow exercise prescription today without complaint.  Will continue to monitor for progression.    Dr. Bethann Punches is Medical Director for [facility] Cardiac Rehabilitation.  Dr. Vida Rigger is Medical Director for [facility] Pulmonary Rehabilitation.

## 2023-09-11 ENCOUNTER — Encounter: Payer: Medicare Other | Admitting: *Deleted

## 2023-09-11 DIAGNOSIS — Z9889 Other specified postprocedural states: Secondary | ICD-10-CM | POA: Diagnosis not present

## 2023-09-11 NOTE — Progress Notes (Signed)
 Daily Session Note  Patient Details  Name: Rhonda [name] MRN: [id number] Date of Birth: [birth year] Referring Provider:   Flowsheet Row Cardiac Rehab from 07/24/2023 in [facility]  Referring Provider Dr. Weston Brass, MD       Encounter Date: 09/11/2023  Check In:  Session Check In - 09/11/23 1120       Check-In   Supervising physician immediately available to respond to emergencies See telemetry face sheet for immediately available ER MD    Location [facility]-Cardiac & Pulmonary [facility]hab    Staff Present Cora Collum, RN, BSN, CCRP;Joseph Hood RCP,RRT,BSRT;[employer], Exercise Physiologist;[employer], ACSM CEP, Exercise Physiologist    Virtual Visit No    Medication changes reported     No    Fall or balance concerns reported    No    Warm-up and Cool-down Performed on first and last piece of equipment    Resistance Training Performed Yes    VAD Patient? No    PAD/SET Patient? No      Pain Assessment   Currently in Pain? No/denies                Social History   Tobacco Use  Smoking Status Former   Current packs/day: 0.20   Types: Cigarettes  Smokeless Tobacco Never  Tobacco Comments   Quit smoking cigarettes 06/07/2023  was smoking 2 cigarettes a day  several months before surgery    Goals Met:  Independence with exercise equipment Exercise tolerated well No report of concerns or symptoms today  Goals Unmet:  Not Applicable  Comments: Pt able to follow exercise prescription today without complaint.  Will continue to monitor for progression.    Dr. Bethann Punches is Medical Director for [facility] Cardiac Rehabilitation.  Dr. Vida Rigger is Medical Director for [facility].

## 2023-09-13 ENCOUNTER — Encounter: Payer: Self-pay | Admitting: *Deleted

## 2023-09-13 ENCOUNTER — Ambulatory Visit (HOSPITAL_COMMUNITY): Attending: Cardiovascular Disease

## 2023-09-13 DIAGNOSIS — Z952 Presence of prosthetic heart valve: Secondary | ICD-10-CM | POA: Diagnosis not present

## 2023-09-13 DIAGNOSIS — T8203XA Leakage of heart valve prosthesis, initial encounter: Secondary | ICD-10-CM | POA: Insufficient documentation

## 2023-09-13 DIAGNOSIS — I10 Essential (primary) hypertension: Secondary | ICD-10-CM | POA: Insufficient documentation

## 2023-09-13 DIAGNOSIS — G4730 Sleep apnea, unspecified: Secondary | ICD-10-CM | POA: Diagnosis not present

## 2023-09-13 DIAGNOSIS — Z9889 Other specified postprocedural states: Secondary | ICD-10-CM | POA: Diagnosis not present

## 2023-09-13 DIAGNOSIS — I509 Heart failure, unspecified: Secondary | ICD-10-CM | POA: Diagnosis not present

## 2023-09-13 DIAGNOSIS — I4891 Unspecified atrial fibrillation: Secondary | ICD-10-CM | POA: Insufficient documentation

## 2023-09-13 DIAGNOSIS — I2510 Atherosclerotic heart disease of native coronary artery without angina pectoris: Secondary | ICD-10-CM | POA: Diagnosis not present

## 2023-09-13 DIAGNOSIS — I252 Old myocardial infarction: Secondary | ICD-10-CM | POA: Insufficient documentation

## 2023-09-13 LAB — ECHOCARDIOGRAM COMPLETE
Area-P 1/2: 1.59 cm2
MV M vel: 4.4 m/s
MV Peak grad: 77.4 mmHg
MV VTI: 1.38 cm2
S' Lateral: 3.9 cm

## 2023-09-13 NOTE — Progress Notes (Signed)
 Cardiac Individual Treatment Plan  Patient Details  Name: Rhonda [name] MRN: [id number] Date of Birth: 20-Dec-1955 Referring Provider:   Flowsheet Row Cardiac Rehab from 07/24/2023 in [facility] Cardiac and Pulmonary Rehab  Referring Provider Dr. Weston Brass, MD       Initial Encounter Date:  Flowsheet Row Cardiac Rehab from 07/24/2023 in [facility] Cardiac and Pulmonary Rehab  Date 07/24/23       Visit Diagnosis: S/P MVR (mitral valve repair)  S/P tricuspid valve repair  Patient's Home Medications on Admission:  Current Outpatient Medications:    acetaminophen (TYLENOL) 500 MG tablet, Take 500 mg by mouth every 6 (six) hours as needed., Disp: , Rfl:    albuterol (PROVENTIL HFA;VENTOLIN HFA) 108 (90 Base) MCG/ACT inhaler, Inhale 2 puffs into the lungs every 6 (six) hours as needed for wheezing or shortness of breath., Disp: 1 Inhaler, Rfl: 2   apixaban (ELIQUIS) 5 MG TABS tablet, Take 1 tablet (5 mg total) by mouth 2 (two) times daily., Disp: 60 tablet, Rfl: 1   atorvastatin (LIPITOR) 20 MG tablet, Take 1 tablet (20 mg total) by mouth every evening., Disp: 90 tablet, Rfl: 3   digoxin (LANOXIN) 0.125 MG tablet, Take 0.5 tablets (0.0625 mg total) by mouth daily., Disp: 45 tablet, Rfl: 3   furosemide (LASIX) 40 MG tablet, Take 40 mg by mouth daily., Disp: , Rfl:    Homeopathic Products [facility] MENTAL FOCUS PO), Take 2 capsules by mouth in the morning. Amare MentaFocus, Disp: , Rfl:    metoprolol tartrate (LOPRESSOR) 25 MG tablet, Take 1 tablet (25 mg total) by mouth 2 (two) times daily., Disp: 180 tablet, Rfl: 2   Multiple Vitamins-Iron (MULTIVITAMINS WITH IRON) TABS tablet, Take 1 tablet by mouth daily., Disp: , Rfl:    NON FORMULARY, Take 2 capsules by mouth in the morning. Mood+ by [employer], Disp: , Rfl:    OVER THE COUNTER MEDICATION, Take 1 packet by mouth daily. Amare Edge+ Grape Plant-Based Nootropics, Disp: , Rfl:    pantoprazole (PROTONIX) 40 MG tablet, Take 1 tablet (40  mg total) by mouth every evening., Disp: , Rfl:    Probiotic Product (PROBIOTIC PO), Take 1 packet by mouth daily. MentaBiotics Advance Gut Brain Nutrition Prebiotic/Probiotic/Phytobiotics, Disp: , Rfl:    spironolactone (ALDACTONE) 100 MG tablet, Take 100 mg by mouth daily., Disp: , Rfl:   Past Medical History: Past Medical History:  Diagnosis Date   Allergy    Atrial fibrillation (HCC)    CAD (coronary artery disease) 08/2009   BMS to RCA, non-obstructive in CX and LAD 2011   Cataract    CHF (congestive heart failure) (HCC)    Cirrhosis (HCC)    Dysrhythmia    A. Fib   Edema, peripheral    Heart murmur    Hypertension    Myocardial infarction [facility])    Nausea    Pneumonia    January 2020   Sleep apnea    No CPAP   Trichomoniasis 06/05/2018    Tobacco Use: Social History   Tobacco Use  Smoking Status Former   Current packs/day: 0.20   Types: Cigarettes  Smokeless Tobacco Never  Tobacco Comments   Quit smoking cigarettes 06/07/2023  was smoking 2 cigarettes a day  several months before surgery    Labs: Review Flowsheet  More data exists      Latest Ref Rng & Units 03/31/2023 06/12/2023 06/14/2023 06/15/2023 06/16/2023  Labs for ITP Cardiac and Pulmonary Rehab  Hemoglobin A1c 4.8 -  5.6 % - 6.2  - - -  PH, Arterial 7.35 - 7.45 7.361  - 7.255  7.233  7.200  7.326  7.414  7.370  7.446  7.301  7.269  7.281  7.259  7.433  7.237  7.281   PCO2 arterial 32 - 48 mmHg 46.4  - 56.1  64.3  65.4  49.2  41.7  47.4  37.9  56.5  54.7  56.2  54.0  34.7  58.4  55.7   Bicarbonate 20.0 - 28.0 mmol/L 26.3  27.8  - 25.1  27.3  25.7  25.9  26.7  27.4  26.4  26.1  27.8  25.1  26.5  24.1  23.1  24.7  26.2   TCO2 22 - 32 mmol/L 28  29  - 27  29  28  27  28  27  29  30  28  27  27  30  29  27  28  26  24  26  28    Acid-base deficit 0.0 - 2.0 mmol/L - - 3.0  1.0  4.0  1.0  2.0  1.0  3.0  1.0  3.0  1.0   O2 Saturation % 89  63  - 94  93  93  97  100  100  80  100  100  93  95  93  94  93  93      Details       Multiple values from one day are sorted in reverse-chronological order          Exercise Target Goals: Exercise Program Goal: Individual exercise prescription set using results from initial 6 min walk test and THRR while considering  patient's activity barriers and safety.   Exercise Prescription Goal: Initial exercise prescription builds to 30-45 minutes a day of aerobic activity, 2-3 days per week.  Home exercise guidelines will be given to patient during program as part of exercise prescription that the participant will acknowledge.   Education: Aerobic Exercise: - Group verbal and visual presentation on the components of exercise prescription. Introduces F.I.T.T principle from ACSM for exercise prescriptions.  Reviews F.I.T.T. principles of aerobic exercise including progression. Written material given at graduation. Flowsheet Row Cardiac Rehab from 07/24/2023 in [facility] Cardiac and Pulmonary Rehab  Education need identified 07/24/23       Education: Resistance Exercise: - Group verbal and visual presentation on the components of exercise prescription. Introduces F.I.T.T principle from ACSM for exercise prescriptions  Reviews F.I.T.T. principles of resistance exercise including progression. Written material given at graduation.    Education: Exercise & Equipment Safety: - Individual verbal instruction and demonstration of equipment use and safety with use of the equipment. Flowsheet Row Cardiac Rehab from 07/24/2023 in [facility] Cardiac and Pulmonary Rehab  Date 07/24/23  Educator NT  Instruction Review Code 1- Verbalizes Understanding       Education: Exercise Physiology & General Exercise Guidelines: - Group verbal and written instruction with models to review the exercise physiology of the cardiovascular system and associated critical values. Provides general exercise guidelines with specific guidelines to those with heart or lung disease.    Education:  Flexibility, Balance, Mind/Body Relaxation: - Group verbal and visual presentation with interactive activity on the components of exercise prescription. Introduces F.I.T.T principle from ACSM for exercise prescriptions. Reviews F.I.T.T. principles of flexibility and balance exercise training including progression. Also discusses the mind body connection.  Reviews various relaxation techniques to help reduce and manage stress (i.e. Deep breathing,  progressive muscle relaxation, and visualization). Balance handout provided to take home. Written material given at graduation.   Activity Barriers & Risk Stratification:  Activity Barriers & Cardiac Risk Stratification - 07/24/23 1556       Activity Barriers & Cardiac Risk Stratification   Activity Barriers Joint Problems   R knee pain   Cardiac Risk Stratification Moderate             6 Minute Walk:  6 Minute Walk     Row Name 07/24/23 1557         6 Minute Walk   Phase Initial     Distance 1125 feet     Walk Time 6 minutes     # of Rest Breaks 0     MPH 2.13     METS 2.6     RPE 9     Perceived Dyspnea  0     VO2 Peak 9.09     Symptoms No     Resting HR 72 bpm     Resting BP 106/60     Resting Oxygen Saturation  97 %     Exercise Oxygen Saturation  during 6 min walk 95 %     Max Ex. HR 95 bpm     Max Ex. BP 120/64     2 Minute Post BP 116/62              Oxygen Initial Assessment:  Oxygen Initial Assessment - 07/03/23 1437       Home Oxygen   Home Oxygen Device Home Concentrator;E-Tanks    Sleep Oxygen Prescription Continuous    Liters per minute 3    Home Exercise Oxygen Prescription Continuous    Liters per minute 3    Home Resting Oxygen Prescription Continuous    Liters per minute 3    Compliance with Home Oxygen Use Yes      Intervention   Short Term Goals To learn and exhibit compliance with exercise, home and travel O2 prescription;To learn and understand importance of monitoring SPO2 with pulse  oximeter and demonstrate accurate use of the pulse oximeter.;To learn and understand importance of maintaining oxygen saturations>88%;To learn and demonstrate proper pursed lip breathing techniques or other breathing techniques. ;To learn and demonstrate proper use of respiratory medications    Long  Term Goals Exhibits compliance with exercise, home  and travel O2 prescription;Verbalizes importance of monitoring SPO2 with pulse oximeter and return demonstration;Maintenance of O2 saturations>88%;Exhibits proper breathing techniques, such as pursed lip breathing or other method taught during program session;Compliance with respiratory medication;Demonstrates proper use of MDI's             Oxygen Re-Evaluation:   Oxygen Discharge (Final Oxygen Re-Evaluation):   Initial Exercise Prescription:  Initial Exercise Prescription - 07/24/23 1600       Date of Initial Exercise RX and Referring Provider   Date 07/24/23    Referring Provider Dr. Weston Brass, MD      Oxygen   Maintain Oxygen Saturation 88% or higher      Treadmill   MPH 2.1    Grade 0    Minutes 15    METs 2.61      Recumbant Bike   Level 2    RPM 50    Watts 15    Minutes 15    METs 2.6      NuStep   Level 2    SPM 80    Minutes 15    METs  2.6      Prescription Details   Frequency (times per week) 3    Duration Progress to 30 minutes of continuous aerobic without signs/symptoms of physical distress      Intensity   THRR 40-80% of Max Heartrate 105-138    Ratings of Perceived Exertion 11-13    Perceived Dyspnea 0-4      Progression   Progression Continue to progress workloads to maintain intensity without signs/symptoms of physical distress.      Resistance Training   Training Prescription Yes    Weight 4 lb    Reps 10-15             Perform Capillary Blood Glucose checks as needed.  Exercise Prescription Changes:   Exercise Prescription Changes     Row Name 07/24/23 1600 08/08/23 1400  08/22/23 1400 09/06/23 1500       Response to Exercise   Blood Pressure (Admit) 106/60 104/64 122/60 102/58    Blood Pressure (Exercise) 120/64 144/70 138/72 148/70    Blood Pressure (Exit) 116/62 98/60 108/60 102/54    Heart Rate (Admit) 72 bpm 71 bpm 82 bpm 91 bpm    Heart Rate (Exercise) 95 bpm 140 bpm 98 bpm 118 bpm    Heart Rate (Exit) 81 bpm 71 bpm 82 bpm 68 bpm    Oxygen Saturation (Admit) 97 % -- -- --    Oxygen Saturation (Exercise) 95 % -- -- --    Rating of Perceived Exertion (Exercise) 9 11 12 12     Perceived Dyspnea (Exercise) 0 -- -- --    Symptoms none none none none    Comments Results First three days of exercise -- --    Duration -- Continue with 30 min of aerobic exercise without signs/symptoms of physical distress. Continue with 30 min of aerobic exercise without signs/symptoms of physical distress. Continue with 30 min of aerobic exercise without signs/symptoms of physical distress.    Intensity -- THRR unchanged THRR unchanged THRR unchanged      Progression   Progression -- Continue to progress workloads to maintain intensity without signs/symptoms of physical distress. Continue to progress workloads to maintain intensity without signs/symptoms of physical distress. Continue to progress workloads to maintain intensity without signs/symptoms of physical distress.    Average METs -- 2.52 2.85 2.84      Resistance Training   Training Prescription -- Yes Yes Yes    Weight -- 4 lb 4 lb 4 lb    Reps -- 10-15 10-15 10-15      Interval Training   Interval Training -- No No No      Treadmill   MPH -- 2.2 2.5 2.5    Grade -- 0 0 0    Minutes -- 15 15 15     METs -- 2.69 2.91 2.91      NuStep   Level -- 4  T6: level 3 5 5     Minutes -- 15 15 15     METs -- 2.8  T6: 2.3 METs 2.9 2.8      REL-XR   Level -- -- 2 5    Minutes -- -- 15 15    METs -- -- 3.2 --      Track   Laps -- -- 35 --    Minutes -- -- 15 --    METs -- -- 2.9 --      Oxygen   Maintain  Oxygen Saturation -- 88% or higher 88% or higher 88% or  higher             Exercise Comments:   Exercise Comments     Row Name 07/31/23 1115           Exercise Comments First full day of exercise!  Patient was oriented to gym and equipment including functions, settings, policies, and procedures.  Patient's individual exercise prescription and treatment plan were reviewed.  All starting workloads were established based on the results of the 6 minute walk test done at initial orientation visit.  The plan for exercise progression was also introduced and progression will be customized based on patient's performance and goals.                Exercise Goals and Review:   Exercise Goals     Row Name 07/24/23 1208             Exercise Goals   Increase Physical Activity Yes       Intervention Develop an individualized exercise prescription for aerobic and resistive training based on initial evaluation findings, risk stratification, comorbidities and participant's personal goals.;Provide advice, education, support and counseling about physical activity/exercise needs.       Expected Outcomes Long Term: Exercising regularly at least 3-5 days a week.;Long Term: Add in home exercise to make exercise part of routine and to increase amount of physical activity.;Short Term: Attend rehab on a regular basis to increase amount of physical activity.       Increase Strength and Stamina Yes       Intervention Provide advice, education, support and counseling about physical activity/exercise needs.;Develop an individualized exercise prescription for aerobic and resistive training based on initial evaluation findings, risk stratification, comorbidities and participant's personal goals.       Expected Outcomes Short Term: Increase workloads from initial exercise prescription for resistance, speed, and METs.;Short Term: Perform resistance training exercises routinely during rehab and add in resistance  training at home;Long Term: Improve cardiorespiratory fitness, muscular endurance and strength as measured by increased METs and functional capacity ( )       Able to understand and use rate of perceived exertion (RPE) scale Yes       Intervention Provide education and explanation on how to use RPE scale       Expected Outcomes Short Term: Able to use RPE daily in rehab to express subjective intensity level;Long Term:  Able to use RPE to guide intensity level when exercising independently       Able to understand and use Dyspnea scale Yes       Intervention Provide education and explanation on how to use Dyspnea scale       Expected Outcomes Short Term: Able to use Dyspnea scale daily in rehab to express subjective sense of shortness of breath during exertion;Long Term: Able to use Dyspnea scale to guide intensity level when exercising independently       Knowledge and understanding of Target Heart Rate Range (THRR) Yes       Intervention Provide education and explanation of THRR including how the numbers were predicted and where they are located for reference       Expected Outcomes Short Term: Able to state/look up THRR;Long Term: Able to use THRR to govern intensity when exercising independently;Short Term: Able to use daily as guideline for intensity in rehab       Able to check pulse independently Yes       Intervention Provide education and demonstration on how to check pulse in  carotid and radial arteries.;Review the importance of being able to check your own pulse for safety during independent exercise       Expected Outcomes Short Term: Able to explain why pulse checking is important during independent exercise;Long Term: Able to check pulse independently and accurately       Understanding of Exercise Prescription Yes       Intervention Provide education, explanation, and written materials on patient's individual exercise prescription       Expected Outcomes Short Term: Able to explain  program exercise prescription;Long Term: Able to explain home exercise prescription to exercise independently                Exercise Goals Re-Evaluation :  Exercise Goals Re-Evaluation     Row Name 07/31/23 1115 08/08/23 1435 08/22/23 1405 09/06/23 1542       Exercise Goal Re-Evaluation   Exercise Goals Review Able to understand and use rate of perceived exertion (RPE) scale;Able to understand and use Dyspnea scale;Knowledge and understanding of Target Heart Rate Range (THRR);Understanding of Exercise Prescription Understanding of Exercise Prescription;Increase Physical Activity;Increase Strength and Stamina Understanding of Exercise Prescription;Increase Physical Activity;Increase Strength and Stamina Understanding of Exercise Prescription;Increase Physical Activity;Increase Strength and Stamina    Comments Reviewed RPE and dyspnea scale, THR and program prescription with pt today.  Pt voiced understanding and was given a copy of goals to take home. [name] is off to a good start in the program. She was able to walk the treadmill at a speed of 2.2 mph with no incline during her first week of rehab. She also did well working at level 3 on the T6 nustep and level 4 on the T4 nustep. We will continue to monitor her progress in the program. [name] is doing well in rehab. She recently increased her treadmill speed to 2.5 mph with no incline. She also improved to level 5 on the T4 nustep and began using the XR at level 2. We will continue to monitor her progress in the program. [name] continues to do well in the program. She has continued to walk the treadmill at a speed of 2.5 mph with no incline. She also improved to level 5 on the XR and has stayed consistent at level 5 on the T4 nustep. We will continue to monitor her progress in the program.    Expected Outcomes Short: Use RPE daily to regulate intensity. Long: Follow program prescription in THR. Short: Continue to follow current exercise prescription. Long:  Continue exercise to improve strength and stamina. Short: Add incline to treadmill workload. Long: Continue exercise to improve strength and stamina. Short: Add incline to treadmill workload. Long: Continue exercise to improve strength and stamina.             Discharge Exercise Prescription (Final Exercise Prescription Changes):  Exercise Prescription Changes - 09/06/23 1500       Response to Exercise   Blood Pressure (Admit) 102/58    Blood Pressure (Exercise) 148/70    Blood Pressure (Exit) 102/54    Heart Rate (Admit) 91 bpm    Heart Rate (Exercise) 118 bpm    Heart Rate (Exit) 68 bpm    Rating of Perceived Exertion (Exercise) 12    Symptoms none    Duration Continue with 30 min of aerobic exercise without signs/symptoms of physical distress.    Intensity THRR unchanged      Progression   Progression Continue to progress workloads to maintain intensity without signs/symptoms of physical distress.  Average METs 2.84      Resistance Training   Training Prescription Yes    Weight 4 lb    Reps 10-15      Interval Training   Interval Training No      Treadmill   MPH 2.5    Grade 0    Minutes 15    METs 2.91      NuStep   Level 5    Minutes 15    METs 2.8      REL-XR   Level 5    Minutes 15      Oxygen   Maintain Oxygen Saturation 88% or higher             Nutrition:  Target Goals: Understanding of nutrition guidelines, daily intake of sodium 1500mg , cholesterol 200mg , calories 30% from fat and 7% or less from saturated fats, daily to have 5 or more servings of fruits and vegetables.  Education: All About Nutrition: -Group instruction provided by verbal, written material, interactive activities, discussions, models, and posters to present general guidelines for heart healthy nutrition including fat, fiber, MyPlate, the role of sodium in heart healthy nutrition, utilization of the nutrition label, and utilization of this knowledge for meal planning.  Follow up email sent as well. Written material given at graduation. Flowsheet Row Cardiac Rehab from 07/24/2023 in [facility] Cardiac and Pulmonary Rehab  Education need identified 07/24/23       Biometrics:  Pre Biometrics - 07/24/23 1556       Pre Biometrics   Height 5\' 4"  (1.626 m)    Weight 169 lb 9.6 oz (76.9 kg)    Waist Circumference 35 inches    Hip Circumference 37 inches    Waist to Hip Ratio 0.95 %    BMI (Calculated) 29.1    Single Leg Stand 8.4 seconds              Nutrition Therapy Plan and Nutrition Goals:  Nutrition Therapy & Goals - 07/24/23 1351       Nutrition Therapy   Diet Cardiac, Low Na    Protein (specify units) 70-90g    Fiber 25 grams    Whole Grain Foods 3 servings    Saturated Fats 15 max. grams    Fruits and Vegetables 5 servings/day    Sodium 2 grams      Personal Nutrition Goals   Nutrition Goal Eat 15-30gProtein and 30-60gCarbs at each meal.    Personal Goal #2 Read labels and reduce sodium intake to below 2300mg . Ideally 1500mg  per day.    Personal Goal #3 Reduce saturated fat, less than 12g per day. Replace bad fats for more heart healthy fats.    Comments Patient drinking 20oz of diet coke, cup of coffee most mornings. She usually eats breakfast, but then wont eat lunch on the days she ate breakfast. She likes salads and often does them for dinner. Reviewed mediterranean diet handout, educated on types of fats, sources, and how to read labels. She reports she reads labels often, was apart of weight watchers for many years. She doesn't salt her foods and tries to keep sodium intake low. Provided her with goal of less than 1500mg  sodium daily. Encouraged her to eat 3 times per day use small protein snack paired with a complex carb. She likes cottage cheese and peaches.      Intervention Plan   Intervention Prescribe, educate and counsel regarding individualized specific dietary modifications aiming towards targeted core components such as  weight, hypertension, lipid management, diabetes, heart failure and other comorbidities.;Nutrition handout(s) given to patient.    Expected Outcomes Short Term Goal: Understand basic principles of dietary content, such as calories, fat, sodium, cholesterol and nutrients.;Short Term Goal: A plan has been developed with personal nutrition goals set during dietitian appointment.;Long Term Goal: Adherence to prescribed nutrition plan.             Nutrition Assessments:  MEDIFICTS Score Key: >=70 Need to make dietary changes  40-70 Heart Healthy Diet <= 40 Therapeutic Level Cholesterol Diet  Flowsheet Row Cardiac Rehab from 07/24/2023 in [facility] Cardiac and Pulmonary Rehab  Picture Your Plate Total Score on Admission 71      Picture Your Plate Scores: <91 Unhealthy dietary pattern with much room for improvement. 41-50 Dietary pattern unlikely to meet recommendations for good health and room for improvement. 51-60 More healthful dietary pattern, with some room for improvement.  >60 Healthy dietary pattern, although there may be some specific behaviors that could be improved.    Nutrition Goals Re-Evaluation:  Nutrition Goals Re-Evaluation     Row Name 09/04/23 1143             Goals   Nutrition Goal Eat 15-30gProtein and 30-60gCarbs at each meal.       Comment [name] reports that she is eating more protein, mostly chicken, yoghurt, oatmeal with protein. She reads food lables and is making mindful choices to reduce sodium and saturated fat. She eats mostly fresh or frozen vegetables and if she gets canned she gets reduced sodium choices.       Expected Outcome Short: continue to read food labels and eat fresh fruits and veggies. Long: maintain heart healhty diet.         Personal Goal #2 Re-Evaluation   Personal Goal #2 Read labels and reduce sodium intake to below 2300mg . Ideally 1500mg  per day.         Personal Goal #3 Re-Evaluation   Personal Goal #3 Reduce saturated fat, less than  12g per day. Replace bad fats for more heart healthy fats.                Nutrition Goals Discharge (Final Nutrition Goals Re-Evaluation):  Nutrition Goals Re-Evaluation - 09/04/23 1143       Goals   Nutrition Goal Eat 15-30gProtein and 30-60gCarbs at each meal.    Comment [name] reports that she is eating more protein, mostly chicken, yoghurt, oatmeal with protein. She reads food lables and is making mindful choices to reduce sodium and saturated fat. She eats mostly fresh or frozen vegetables and if she gets canned she gets reduced sodium choices.    Expected Outcome Short: continue to read food labels and eat fresh fruits and veggies. Long: maintain heart healhty diet.      Personal Goal #2 Re-Evaluation   Personal Goal #2 Read labels and reduce sodium intake to below 2300mg . Ideally 1500mg  per day.      Personal Goal #3 Re-Evaluation   Personal Goal #3 Reduce saturated fat, less than 12g per day. Replace bad fats for more heart healthy fats.             Psychosocial: Target Goals: Acknowledge presence or absence of significant depression and/or stress, maximize coping skills, provide positive support system. Participant is able to verbalize types and ability to use techniques and skills needed for reducing stress and depression.   Education: Stress, Anxiety, and Depression - Group verbal and visual presentation to define topics covered.  Reviews how body is impacted by stress, anxiety, and depression.  Also discusses healthy ways to reduce stress and to treat/manage anxiety and depression.  Written material given at graduation.   Education: Sleep Hygiene -Provides group verbal and written instruction about how sleep can affect your health.  Define sleep hygiene, discuss sleep cycles and impact of sleep habits. Review good sleep hygiene tips.    Initial Review & Psychosocial Screening:  Initial Psych Review & Screening - 07/03/23 1428       Initial Review   Current issues  with None Identified      Family Dynamics   Good Support System? Yes   daughter, neighbor, church family     Barriers   Psychosocial barriers to participate in program There are no identifiable barriers or psychosocial needs.      Screening Interventions   Interventions To provide support and resources with identified psychosocial needs;Provide feedback about the scores to participant;Encouraged to exercise    Expected Outcomes Short Term goal: Utilizing psychosocial counselor, staff and physician to assist with identification of specific Stressors or current issues interfering with healing process. Setting desired goal for each stressor or current issue identified.;Long Term Goal: Stressors or current issues are controlled or eliminated.;Short Term goal: Identification and review with participant of any Quality of Life or Depression concerns found by scoring the questionnaire.;Long Term goal: The participant improves quality of Life and PHQ9 Scores as seen by post scores and/or verbalization of changes             Quality of Life Scores:   Quality of Life - 07/24/23 1206       Quality of Life   Select Quality of Life      Quality of Life Scores   Health/Function Pre 25.43 %    Socioeconomic Pre 23.31 %    Psych/Spiritual Pre 29.64 %    Family Pre 25.3 %    GLOBAL Pre 25.77 %            Scores of 19 and below usually indicate a poorer quality of life in these areas.  A difference of  2-3 points is a clinically meaningful difference.  A difference of 2-3 points in the total score of the Quality of Life Index has been associated with significant improvement in overall quality of life, self-image, physical symptoms, and general health in studies assessing change in quality of life.  PHQ-9: Review Flowsheet       07/24/2023  Depression screen PHQ 2/9  Decreased Interest 0  Down, Depressed, Hopeless 0  PHQ - 2 Score 0  Altered sleeping 0  Tired, decreased energy 0  Change  in appetite 0  Feeling bad or failure about yourself  0  Trouble concentrating 0  Moving slowly or fidgety/restless 0  Suicidal thoughts 0  PHQ-9 Score 0   Interpretation of Total Score  Total Score Depression Severity:  1-4 = Minimal depression, 5-9 = Mild depression, 10-14 = Moderate depression, 15-19 = Moderately severe depression, 20-27 = Severe depression   Psychosocial Evaluation and Intervention:  Psychosocial Evaluation - 07/03/23 1439       Psychosocial Evaluation & Interventions   Interventions Encouraged to exercise with the program and follow exercise prescription    Comments There are no barriers to her attending the program. She lives with a roommate in her condo. She has support from her daughter, neighbors and her church family.  She is ready to start the prgoram and work on International aid/development worker  and stamina and continue healing from her surgery. She is wearing oxygen, was told it is short term, and she is ready to get rid of it as soon as her physician says it time .  She quit smoking right before her surgery, was down to 2 cigarettes a day.    Expected Outcomes STG attend all scheduled sessions, follow the exercise progression. use her tobacco cessation resources to stay quit. LTG continue her exercise progression, stay quit with tobacco, use her [place] Quitline resource and MD if needed for help    Continue Psychosocial Services  Follow up required by staff             Psychosocial Re-Evaluation:  Psychosocial Re-Evaluation     Row Name 09/04/23 1148             Psychosocial Re-Evaluation   Current issues with None Identified       Comments [name] reports no concerns with sleep, stress, or mental health. She still has a good support system. She feels well rested when she gets up in the morning and has energy thoughout the day.       Expected Outcomes Short: continue to attend cardiac rehab for exercise and its mental health benefits. Long: maintain good mental health  habits.       Interventions Encouraged to attend Cardiac Rehabilitation for the exercise       Continue Psychosocial Services  Follow up required by staff                Psychosocial Discharge (Final Psychosocial Re-Evaluation):  Psychosocial Re-Evaluation - 09/04/23 1148       Psychosocial Re-Evaluation   Current issues with None Identified    Comments [name] reports no concerns with sleep, stress, or mental health. She still has a good support system. She feels well rested when she gets up in the morning and has energy thoughout the day.    Expected Outcomes Short: continue to attend cardiac rehab for exercise and its mental health benefits. Long: maintain good mental health habits.    Interventions Encouraged to attend Cardiac Rehabilitation for the exercise    Continue Psychosocial Services  Follow up required by staff             Vocational Rehabilitation: Provide vocational rehab assistance to qualifying candidates.   Vocational Rehab Evaluation & Intervention:  Vocational Rehab - 07/03/23 1432       Initial Vocational Rehab Evaluation & Intervention   Assessment shows need for Vocational Rehabilitation No      Vocational Rehab Re-Evaulation   Comments no need for VR             Education: Education Goals: Education classes will be provided on a variety of topics geared toward better understanding of heart health and risk factor modification. Participant will state understanding/return demonstration of topics presented as noted by education test scores.  Learning Barriers/Preferences:   General Cardiac Education Topics:  AED/CPR: - Group verbal and written instruction with the use of models to demonstrate the basic use of the AED with the basic ABC's of resuscitation.   Anatomy and Cardiac Procedures: - Group verbal and visual presentation and models provide information about basic cardiac anatomy and function. Reviews the testing methods done to diagnose  heart disease and the outcomes of the test results. Describes the treatment choices: Medical Management, Angioplasty, or Coronary Bypass Surgery for treating various heart conditions including Myocardial Infarction, Angina, Valve Disease, and Cardiac Arrhythmias.  Written material  given at graduation. Flowsheet Row Cardiac Rehab from 07/24/2023 in [facility] Cardiac and Pulmonary Rehab  Education need identified 07/24/23       Medication Safety: - Group verbal and visual instruction to review commonly prescribed medications for heart and lung disease. Reviews the medication, class of the drug, and side effects. Includes the steps to properly store meds and maintain the prescription regimen.  Written material given at graduation.   Intimacy: - Group verbal instruction through game format to discuss how heart and lung disease can affect sexual intimacy. Written material given at graduation..   Know Your Numbers and Heart Failure: - Group verbal and visual instruction to discuss disease risk factors for cardiac and pulmonary disease and treatment options.  Reviews associated critical values for Overweight/Obesity, Hypertension, Cholesterol, and Diabetes.  Discusses basics of heart failure: signs/symptoms and treatments.  Introduces Heart Failure Zone chart for action plan for heart failure.  Written material given at graduation.   Infection Prevention: - Provides verbal and written material to individual with discussion of infection control including proper hand washing and proper equipment cleaning during exercise session. Flowsheet Row Cardiac Rehab from 07/24/2023 in [facility] Cardiac and Pulmonary Rehab  Date 07/24/23  Educator NT  Instruction Review Code 1- Verbalizes Understanding       Falls Prevention: - Provides verbal and written material to individual with discussion of falls prevention and safety. Flowsheet Row Cardiac Rehab from 07/24/2023 in [facility] Cardiac and Pulmonary Rehab  Date  07/03/23  Educator SB  Instruction Review Code 1- Verbalizes Understanding       Other: -Provides group and verbal instruction on various topics (see comments)   Knowledge Questionnaire Score:  Knowledge Questionnaire Score - 07/24/23 1206       Knowledge Questionnaire Score   Pre Score 22/26             Core Components/Risk Factors/Patient Goals at Admission:  Personal Goals and Risk Factors at Admission - 07/03/23 1523       Core Components/Risk Factors/Patient Goals on Admission   Number of packs per day Aidaly is a former tobacco user. Intervention for tobacco cessation was provided at the initial medical review. She was asked about readiness to quit and reported she quit right before her surgery, had weaned down to 2 cigarettes a day. . Patient was advised and educated about tobacco cessation using combination therapy, tobacco cessation classes, quit line, and quit smoking apps. Patient demonstrated understanding of this material. Staff will continue to provide encouragement and follow up with the patient throughout the program.             Education:Diabetes - Individual verbal and written instruction to review signs/symptoms of diabetes, desired ranges of glucose level fasting, after meals and with exercise. Acknowledge that pre and post exercise glucose checks will be done for 3 sessions at entry of program.   Core Components/Risk Factors/Patient Goals Review:   Goals and Risk Factor Review     Row Name 09/04/23 1138             Core Components/Risk Factors/Patient Goals Review   Personal Goals Review Tobacco Cessation;Heart Failure;Lipids       Review [name] reports that she is still tobacco free, and going on 3 months with no cigarettes. She monitors her weight regularly and monitors for signs of heart failure. She takes all her cholesterol meds regularly follows up with doctor for lab work.       Expected Outcomes short: continue to monitor weight  and  remain tobacco free. Long: continue with lifestyle changes to help control cardiac risk factors. Remain tobacco free for the long term.                Core Components/Risk Factors/Patient Goals at Discharge (Final Review):   Goals and Risk Factor Review - 09/04/23 1138       Core Components/Risk Factors/Patient Goals Review   Personal Goals Review Tobacco Cessation;Heart Failure;Lipids    Review [name] reports that she is still tobacco free, and going on 3 months with no cigarettes. She monitors her weight regularly and monitors for signs of heart failure. She takes all her cholesterol meds regularly follows up with doctor for lab work.    Expected Outcomes short: continue to monitor weight and remain tobacco free. Long: continue with lifestyle changes to help control cardiac risk factors. Remain tobacco free for the long term.             ITP Comments:  ITP Comments     Row Name 07/03/23 1524 07/24/23 1204 07/31/23 1114 08/16/23 0736 09/13/23 0934   ITP Comments Virtual orientation call completed today. shehas an appointment on Date: not set until able to drive, she will call  for EP eval and gym Orientation.  Documentation of diagnosis [facility] be found in [facility]  Date: 06/14/2023 .    Journii is a former tobacco user. Intervention for tobacco cessation was provided at the initial medical review. She was asked about readiness to quit and reported she quit right before her surgery, had weaned down to 2 cigarettes a day. . Patient was advised and educated about tobacco cessation using combination therapy, tobacco cessation classes, quit line, and quit smoking apps. Patient demonstrated understanding of this material. Staff will continue to provide encouragement and follow up with the patient throughout the program. Completed and gym orientation. Initial ITP created and sent for review to Dr. Bethann Punches, Medical Director. First full day of exercise!  Patient was oriented to gym and equipment  including functions, settings, policies, and procedures.  Patient's individual exercise prescription and treatment plan were reviewed.  All starting workloads were established based on the results of the 6 minute walk test done at initial orientation visit.  The plan for exercise progression was also introduced and progression will be customized based on patient's performance and goals. 30 Day review completed. Medical Director ITP review done, changes made as directed, and signed approval by Medical Director. New to program. 30 Day review completed. Medical Director ITP review done, changes made as directed, and signed approval by Medical Director.            Comments:

## 2023-09-13 NOTE — Progress Notes (Signed)
 Daily Session Note  Patient Details  Name: Rhonda [name] MRN: [id number] Date of Birth: Dec 24, 1955 Referring Provider:   Flowsheet Row Cardiac Rehab from 07/24/2023 in [facility] Cardiac and Pulmonary Rehab  Referring Provider Dr. Weston Brass, MD       Encounter Date: 09/13/2023  Check In:  Session Check In - 09/13/23 1110       Check-In   Supervising physician immediately available to respond to emergencies See telemetry face sheet for immediately available ER MD    Location [facility]-Cardiac & Pulmonary Rehab    Staff Present Rory Percy, MS, Exercise Physiologist;Meredith Jewel Baize RN,BSN;Jabarri Stefanelli RN,BSN,MPA;Joseph Reino Kent RCP,RRT,BSRT    Virtual Visit No    Medication changes reported     No    Fall or balance concerns reported    No    Tobacco Cessation No Change    Warm-up and Cool-down Performed on first and last piece of equipment    Resistance Training Performed Yes    VAD Patient? No    PAD/SET Patient? No      Pain Assessment   Currently in Pain? No/denies                Social History   Tobacco Use  Smoking Status Former   Current packs/day: 0.20   Types: Cigarettes  Smokeless Tobacco Never  Tobacco Comments   Quit smoking cigarettes 06/07/2023  was smoking 2 cigarettes a day  several months before surgery    Goals Met:  Independence with exercise equipment Exercise tolerated well No report of concerns or symptoms today Strength training completed today  Goals Unmet:  Not Applicable  Comments: Pt able to follow exercise prescription today without complaint.  Will continue to monitor for progression.    Dr. Bethann Punches is Medical Director for [facility] Cardiac Rehabilitation.  Dr. Vida Rigger is Medical Director for [facility] Pulmonary Rehabilitation.

## 2023-09-15 ENCOUNTER — Encounter: Payer: Medicare Other | Admitting: *Deleted

## 2023-09-15 DIAGNOSIS — Z9889 Other specified postprocedural states: Secondary | ICD-10-CM | POA: Diagnosis not present

## 2023-09-15 NOTE — Progress Notes (Signed)
 Daily Session Note  Patient Details  Name: Rhonda [name] MRN: [id number] Date of Birth: 02-13-1956 Referring Provider:   Flowsheet Row Cardiac Rehab from 07/24/2023 in [facility] Cardiac and Pulmonary Rehab  Referring Provider Dr. Weston Brass, MD       Encounter Date: 09/15/2023  Check In:  Session Check In - 09/15/23 1131       Check-In   Supervising physician immediately available to respond to emergencies See telemetry face sheet for immediately available ER MD    Location [facility]-Cardiac & Pulmonary Rehab    Staff Present Maxon Conetta BS, Exercise Physiologist;Noah Tickle, BS, Exercise Physiologist;Dago Jungwirth, RN, BSN, CCRP;Joseph Hood RCP,RRT,BSRT    Virtual Visit No    Medication changes reported     No    Fall or balance concerns reported    No    Warm-up and Cool-down Performed on first and last piece of equipment    Resistance Training Performed Yes    VAD Patient? No    PAD/SET Patient? No      Pain Assessment   Currently in Pain? No/denies                Social History   Tobacco Use  Smoking Status Former   Current packs/day: 0.20   Types: Cigarettes  Smokeless Tobacco Never  Tobacco Comments   Quit smoking cigarettes 06/07/2023  was smoking 2 cigarettes a day  several months before surgery    Goals Met:  Independence with exercise equipment Exercise tolerated well No report of concerns or symptoms today  Goals Unmet:  Not Applicable  Comments: Pt able to follow exercise prescription today without complaint.  Will continue to monitor for progression.    Dr. Bethann Punches is Medical Director for [facility] Cardiac Rehabilitation.  Dr. Vida Rigger is Medical Director for [facility] Pulmonary Rehabilitation.

## 2023-09-18 ENCOUNTER — Encounter: Payer: Medicare Other | Admitting: *Deleted

## 2023-09-18 DIAGNOSIS — Z9889 Other specified postprocedural states: Secondary | ICD-10-CM | POA: Diagnosis not present

## 2023-09-18 NOTE — Progress Notes (Signed)
 Daily Session Note  Patient Details  Name: Rhonda [name] MRN: [id number] Date of Birth: December 25, 1955 Referring Provider:   Flowsheet Row Cardiac Rehab from 07/24/2023 in [facility] Cardiac and Pulmonary Rehab  Referring Provider Dr. Grady Lawman, MD       Encounter Date: 09/18/2023  Check In:  Session Check In - 09/18/23 1116       Check-In   Supervising physician immediately available to respond to emergencies See telemetry face sheet for immediately available ER MD    Location [facility]-Cardiac & Pulmonary Rehab    Staff Present Maud Sorenson, RN, BSN, CCRP;Maxon Conetta BS, Exercise Physiologist;Kelly [employer], ACSM CEP, Exercise Physiologist;Noah Tickle, BS, Exercise Physiologist;Jason Martina Sledge RDN,LDN    Virtual Visit No    Medication changes reported     No    Fall or balance concerns reported    No    Warm-up and Cool-down Performed on first and last piece of equipment    Resistance Training Performed Yes    VAD Patient? No    PAD/SET Patient? No      Pain Assessment   Currently in Pain? No/denies                Social History   Tobacco Use  Smoking Status Former   Current packs/day: 0.20   Types: Cigarettes  Smokeless Tobacco Never  Tobacco Comments   Quit smoking cigarettes 06/07/2023  was smoking 2 cigarettes a day  several months before surgery    Goals Met:  Independence with exercise equipment Exercise tolerated well No report of concerns or symptoms today  Goals Unmet:  Not Applicable  Comments: Pt able to follow exercise prescription today without complaint.  Will continue to monitor for progression.    Dr. Firman Hughes is Medical Director for [facility] Cardiac Rehabilitation.  Dr. Fuad Aleskerov is Medical Director for [facility] Pulmonary Rehabilitation.

## 2023-09-19 ENCOUNTER — Other Ambulatory Visit: Payer: Self-pay | Admitting: Gastroenterology

## 2023-09-19 ENCOUNTER — Other Ambulatory Visit: Payer: Self-pay | Admitting: Internal Medicine

## 2023-09-20 ENCOUNTER — Encounter: Payer: Medicare Other | Admitting: *Deleted

## 2023-09-20 DIAGNOSIS — Z9889 Other specified postprocedural states: Secondary | ICD-10-CM | POA: Diagnosis not present

## 2023-09-20 NOTE — Progress Notes (Signed)
 Daily Session Note  Patient Details  Name: Rhonda [name] MRN: [id number] Date of Birth: 07/08/1955 Referring Provider:   Flowsheet Row Cardiac Rehab from 07/24/2023 in [facility] Cardiac and Pulmonary Rehab  Referring Provider Dr. Grady Lawman, MD       Encounter Date: 09/20/2023  Check In:  Session Check In - 09/20/23 1205       Check-In   Supervising physician immediately available to respond to emergencies See telemetry face sheet for immediately available ER MD    Location [facility]-Cardiac & Pulmonary Rehab    Staff Present Lyell Samuel, MS, Exercise Physiologist;Maxon Conetta BS, Exercise Physiologist;Megha Agnes, RN, BSN, CCRP;Meredith Craven RN,BSN    Virtual Visit No    Medication changes reported     No    Fall or balance concerns reported    No    Warm-up and Cool-down Performed on first and last piece of equipment    Resistance Training Performed Yes    VAD Patient? No    PAD/SET Patient? No      Pain Assessment   Currently in Pain? No/denies                Social History   Tobacco Use  Smoking Status Former   Current packs/day: 0.20   Types: Cigarettes  Smokeless Tobacco Never  Tobacco Comments   Quit smoking cigarettes 06/07/2023  was smoking 2 cigarettes a day  several months before surgery    Goals Met:  Independence with exercise equipment Exercise tolerated well No report of concerns or symptoms today  Goals Unmet:  Not Applicable  Comments: Pt able to follow exercise prescription today without complaint.  Will continue to monitor for progression.    Dr. Firman Hughes is Medical Director for [facility] Cardiac Rehabilitation.  Dr. Fuad Aleskerov is Medical Director for [facility] Pulmonary Rehabilitation.

## 2023-09-22 ENCOUNTER — Encounter: Payer: Medicare Other | Admitting: *Deleted

## 2023-09-22 DIAGNOSIS — Z9889 Other specified postprocedural states: Secondary | ICD-10-CM

## 2023-09-22 IMAGING — MR MR ABDOMEN WO/W CM
19 series · 48 of 48 positions shown · IV contrast (9 GADAVIST)
Comparison: Abdominal ultrasound 07/23/2021

CLINICAL DATA: Liver lesion

EXAM:
MRI ABDOMEN WITHOUT AND WITH CONTRAST
TECHNIQUE: Multiplanar multisequence MR imaging of the abdomen was performed
both before and after the administration of intravenous contrast.
CONTRAST:  9mL GADAVIST GADOBUTROL 1 MMOL/ML IV SOLN

[Series 2: DWI · axial · 6.0mm · 1.59mm/px · z∈[-139,+99]mm · 3 of 68 slices shown (1 of 2)]
[im 1/68]
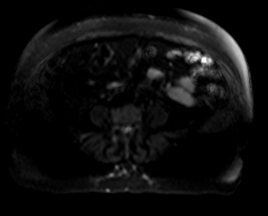
[im 34/68]
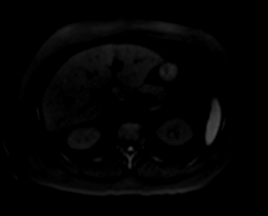
[im 68/68]
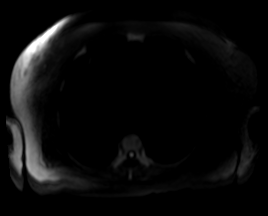

[Series 3: DWI · axial · 6.0mm · 1.59mm/px · z∈[-139,+99]mm · 2 of 34 slices shown (2 of 2)]
[im 1/34]
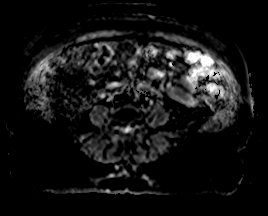
[im 34/34]
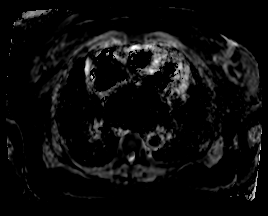

[Series 4: T2 fat-sat · axial · 6.0mm · 1.25mm/px · 1 of 36 slices shown]
[im 1/36]
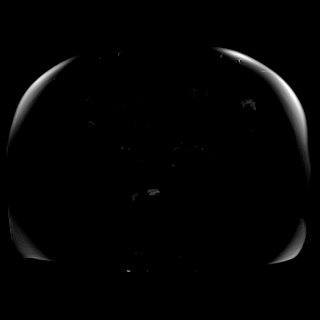

[Series 7: cor haste · coronal · 6.0mm · 1.56mm/px · 1 of 42 slices shown]
[im 1/42]
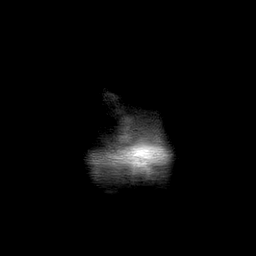

[Series 8: bSSFP · axial · 6.0mm · 2.29mm/px · 1 of 44 slices shown]
[im 1/44]
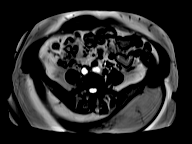

[Series 9: T1 dynamic · axial · 3.0mm · 1.72mm/px · z∈[-168,+117]mm · 3 of 96 slices shown (1 of 10)]
[im 1/96]
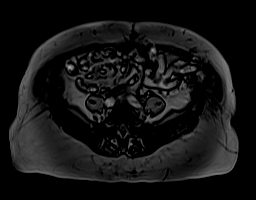
[im 48/96]
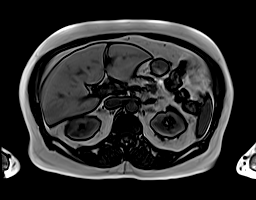
[im 96/96]
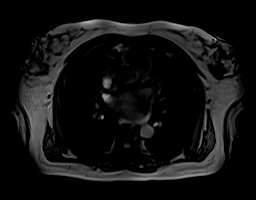

[Series 10: T1 dynamic · axial · 3.0mm · 1.72mm/px · z∈[-168,+117]mm · 3 of 96 slices shown (2 of 10)]
[im 1/96]
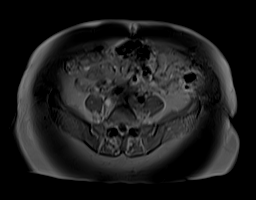
[im 48/96]
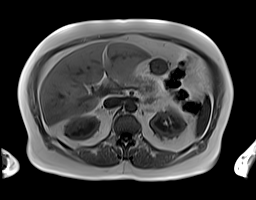
[im 96/96]
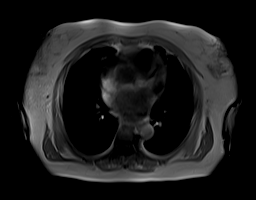

[Series 12: T1 dynamic · axial · 3.0mm · 1.72mm/px · z∈[-168,+117]mm · 3 of 96 slices shown (3 of 10)]
[im 1/96]
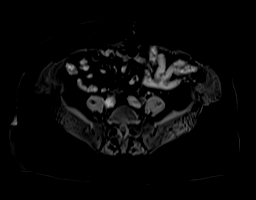
[im 48/96]
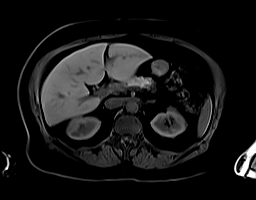
[im 96/96]
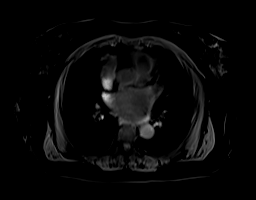

[Series 17: T1 dynamic · axial · 3.0mm · 1.72mm/px · z∈[-168,+117]mm · 3 of 96 slices shown (4 of 10)]
[im 1/96]
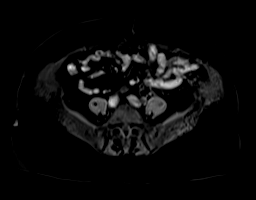
[im 48/96]
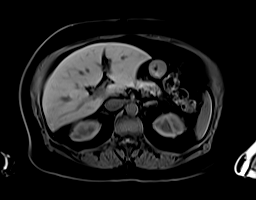
[im 96/96]
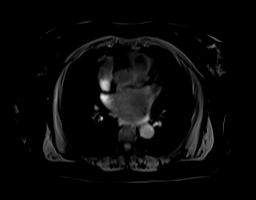

[Series 19: T1 dynamic · axial · 3.0mm · 1.72mm/px · z∈[-168,+117]mm · 3 of 96 slices shown (5 of 10)]
[im 1/96]
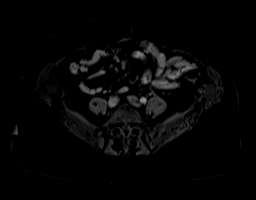
[im 48/96]
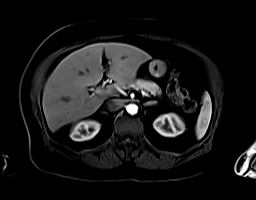
[im 96/96]
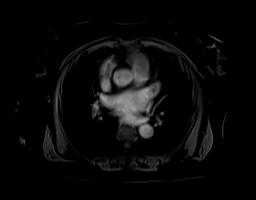

[Series 20: T1 dynamic · axial · 3.0mm · 1.72mm/px · z∈[-168,+117]mm · 3 of 96 slices shown (6 of 10)]
[im 1/96]
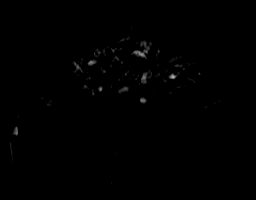
[im 48/96]
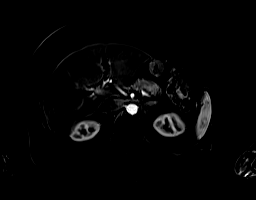
[im 96/96]
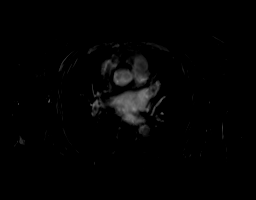

[Series 22: T1 dynamic · axial · 3.0mm · 1.72mm/px · z∈[-168,+117]mm · 3 of 96 slices shown (7 of 10)]
[im 1/96]
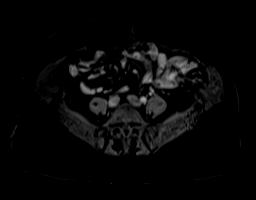
[im 48/96]
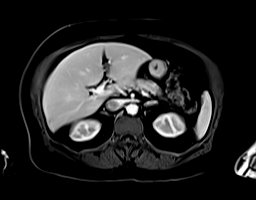
[im 96/96]
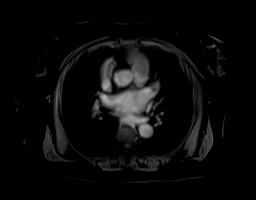

[Series 23: T1 dynamic · axial · 3.0mm · 1.72mm/px · z∈[-168,+117]mm · 3 of 96 slices shown (8 of 10)]
[im 1/96]
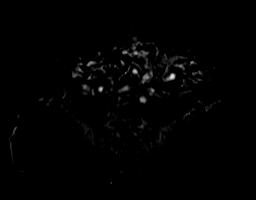
[im 48/96]
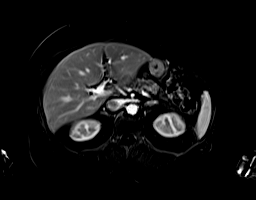
[im 96/96]
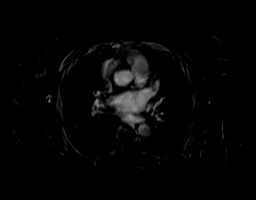

[Series 25: T1 dynamic · axial · 3.0mm · 1.72mm/px · z∈[-168,+117]mm · 3 of 96 slices shown (9 of 10)]
[im 1/96]
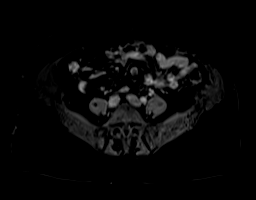
[im 48/96]
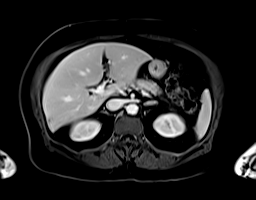
[im 96/96]
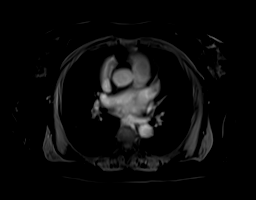

[Series 26: T1 dynamic · axial · 3.0mm · 1.72mm/px · z∈[-168,+117]mm · 3 of 96 slices shown (10 of 10)]
[im 1/96]
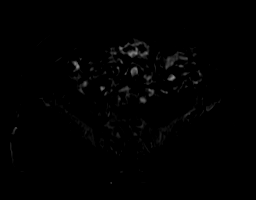
[im 48/96]
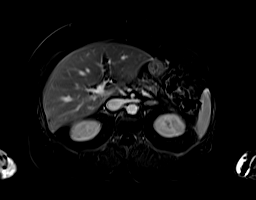
[im 96/96]
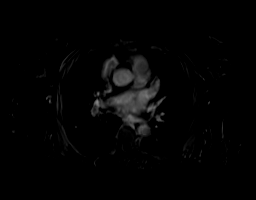

[Series 27: ax_haste_mbh · axial · 6.0mm · 1.38mm/px · 1 of 38 slices shown]
[im 1/38]
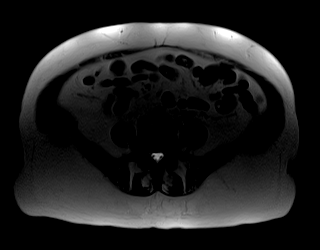

[Series 29: cor_vibe_dixon_delayed_w · coronal · 3.0mm · 2.34mm/px · 3 of 104 slices shown]
[im 1/104]
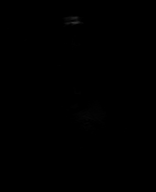
[im 52/104]
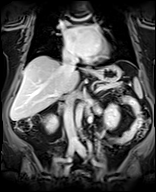
[im 104/104]
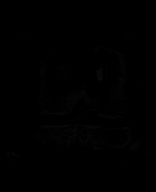

[Series 31: ax_dixon_delayed_w_reg · axial · 3.0mm · 1.72mm/px · z∈[-168,+117]mm · 3 of 96 slices shown]
[im 1/96]
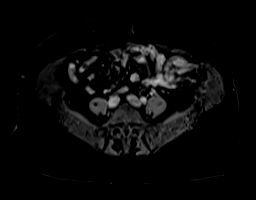
[im 48/96]
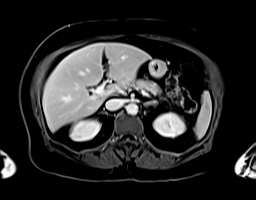
[im 96/96]
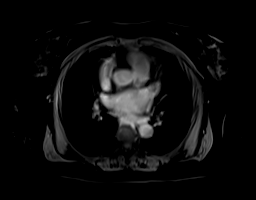

[Series 32: ax_dixon_delayed_w_reg_sub · axial · 3.0mm · 1.72mm/px · z∈[-168,+117]mm · 3 of 96 slices shown]
[im 1/96]
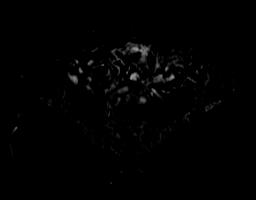
[im 48/96]
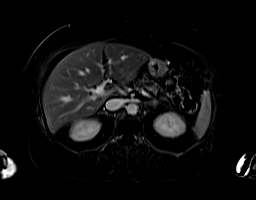
[im 96/96]
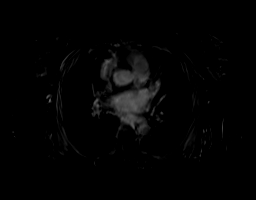

[48 of 48 positions shown; findings below may reference images not displayed]

FINDINGS: Lower chest: Cardiomegaly.

Hepatobiliary: Liver is normal in size without evidence of hepatic
steatosis. No definite nodularity visualized. There is a 1 cm lesion
in the right hepatic lobe segment [DATE] which is mildly hyperintense
on T2 and hypointense on T1 and demonstrates peripheral irregular
postcontrast enhancement with gradual filling, most consistent with
hemangioma. A few tiny subcentimeter hepatic cysts also noted. No
early enhancing mass with washout identified. Gallbladder appears
normal. No biliary ductal dilatation.

Pancreas: There are 2 adjacent complex cystic masses identified in
the posterior head of the pancreas which are best seen on the
coronal T2 sequence and measure up to 2 x 1.7 cm at the more
superior lesion and 1.5 x 1.5 cm just inferiorly. Both of the
lesions demonstrate multiple thin internal septations/loculations in
both appear to communicate with the pancreatic duct. No definite
enhancing soft tissue nodularity within the cysts. There is
enhancement of the septa. The main pancreatic duct is normal
caliber.

Spleen:  Within normal limits in size and appearance.

Adrenals/Urinary Tract: 12 mm adenoma of the left adrenal gland. A
few renal cortical cysts identified bilaterally measuring up to
cm on the right. No hydronephrosis.

Stomach/Bowel: Colonic diverticulosis. No evidence of bowel
obstruction.

Vascular/Lymphatic: No pathologically enlarged lymph nodes
identified. No abdominal aortic aneurysm demonstrated.

Other:  No ascites.

Musculoskeletal: No suspicious bone lesions identified.
IMPRESSION: 1. 1 cm lesion in the right hepatic lobe appears most consistent
with a small hemangioma. Given the small size, consider follow-up
ultrasound in 6 months to ensure stability.
2. 2 adjacent complex cystic masses in the posterior head of the
pancreas as described, likely side-branch IPMNs. Recommend follow-up
MRI with contrast in 12 months.
3. Small left adrenal gland adenoma.
4. Renal cysts.
5. Colonic diverticulosis.

## 2023-09-22 NOTE — Progress Notes (Signed)
 Daily Session Note  Patient Details  Name: Rhonda [name] MRN: [id number] Date of Birth: 21-Apr-1956 Referring Provider:   Flowsheet Row Cardiac Rehab from 07/24/2023 in [facility] Cardiac and Pulmonary Rehab  Referring Provider Dr. Soyla Merck, MD       Encounter Date: 09/22/2023  Check In:  Session Check In - 09/22/23 1133       Check-In   Supervising physician immediately available to respond to emergencies See telemetry face sheet for immediately available ER MD    Location [facility]-Cardiac & Pulmonary Rehab    Staff Present Bruno Mirza RN,BSN;Maxon Burnell BS, Exercise Physiologist;Kelly Dyane HECKLE, ACSM CEP, Exercise Physiologist;Noah Tickle, BS, Exercise Physiologist    Virtual Visit No    Medication changes reported     No    Fall or balance concerns reported    No    Warm-up and Cool-down Performed on first and last piece of equipment    Resistance Training Performed Yes    VAD Patient? No    PAD/SET Patient? No      Pain Assessment   Currently in Pain? No/denies    Multiple Pain Sites No                Social History   Tobacco Use  Smoking Status Former   Current packs/day: 0.20   Types: Cigarettes  Smokeless Tobacco Never  Tobacco Comments   Quit smoking cigarettes 06/07/2023  was smoking 2 cigarettes a day  several months before surgery    Goals Met:  Independence with exercise equipment Exercise tolerated well No report of concerns or symptoms today Strength training completed today  Goals Unmet:  Not Applicable  Comments: Pt able to follow exercise prescription today without complaint.  Will continue to monitor for progression.    Dr. Oneil Pinal is Medical Director for [facility] Cardiac Rehabilitation.  Dr. Fuad Aleskerov is Medical Director for [facility] Pulmonary Rehabilitation.

## 2023-09-27 ENCOUNTER — Encounter: Payer: Medicare Other | Admitting: *Deleted

## 2023-09-27 DIAGNOSIS — Z9889 Other specified postprocedural states: Secondary | ICD-10-CM
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
## 2023-09-29 ENCOUNTER — Encounter: Payer: Medicare Other | Admitting: *Deleted

## 2023-09-29 DIAGNOSIS — Z9889 Other specified postprocedural states: Secondary | ICD-10-CM

## 2023-09-29 NOTE — Progress Notes (Signed)
 Daily Session Note  Patient Details  Name: Rhonda [name] MRN: [id number] Date of Birth: [birth year] Referring Provider:   Flowsheet Row Cardiac Rehab from 07/24/2023 in [facility] Cardiac and Pulmonary Rehab  Referring Provider Dr. Grady Lawman, MD       Encounter Date: 09/29/2023  Check In:  Session Check In - 09/29/23 1131       Check-In   Supervising physician immediately available to respond to emergencies See telemetry face sheet for immediately available ER MD    Location [facility]-Cardiac & Pulmonary Rehab    Staff Present Maxon Conetta BS, Exercise Physiologist;Noah Tickle, BS, Exercise Physiologist;Shellie Rogoff, RN, BSN, CCRP;Joseph Hood RCP,RRT,BSRT    Virtual Visit No    Medication changes reported     No    Fall or balance concerns reported    No    Warm-up and Cool-down Performed on first and last piece of equipment    Resistance Training Performed Yes    VAD Patient? No    PAD/SET Patient? No      Pain Assessment   Currently in Pain? No/denies                Social History   Tobacco Use  Smoking Status Former   Current packs/day: 0.20   Types: Cigarettes  Smokeless Tobacco Never  Tobacco Comments   Quit smoking cigarettes 06/07/2023  was smoking 2 cigarettes a day  several months before surgery    Goals Met:  Independence with exercise equipment Exercise tolerated well No report of concerns or symptoms today  Goals Unmet:  Not Applicable  Comments: Pt able to follow exercise prescription today without complaint.  Will continue to monitor for progression.    Dr. Firman Hughes is Medical Director for [facility] Cardiac Rehabilitation.  Dr. Fuad Aleskerov is Medical Director for [facility] Pulmonary Rehabilitation.

## 2023-10-02 ENCOUNTER — Encounter: Payer: Medicare Other | Admitting: *Deleted

## 2023-10-02 DIAGNOSIS — Z9889 Other specified postprocedural states: Secondary | ICD-10-CM | POA: Diagnosis not present

## 2023-10-02 NOTE — Progress Notes (Signed)
 Daily Session Note  Patient Details  Name: Rhonda [name] MRN: [id number] Date of Birth: [birth year] Referring Provider:   Flowsheet Row Cardiac Rehab from 07/24/2023 in [facility] Cardiac and Pulmonary Rehab  Referring Provider Dr. Grady Lawman, MD       Encounter Date: 10/02/2023  Check In:  Session Check In - 10/02/23 1159       Check-In   Supervising physician immediately available to respond to emergencies See telemetry face sheet for immediately available ER MD    Location [facility]-Cardiac & Pulmonary Rehab    Staff Present Maud Sorenson, RN, BSN, CCRP;Meredith Manson Seitz RN,BSN;Joseph Hood RCP,RRT,BSRT;Maxon Shiloh BS, Exercise Physiologist;Kelly Sabra Cramp BS, ACSM CEP, Exercise Physiologist    Virtual Visit No    Medication changes reported     No    Fall or balance concerns reported    No    Warm-up and Cool-down Performed on first and last piece of equipment    Resistance Training Performed Yes    VAD Patient? No    PAD/SET Patient? No      Pain Assessment   Currently in Pain? No/denies                Social History   Tobacco Use  Smoking Status Former   Current packs/day: 0.20   Types: Cigarettes  Smokeless Tobacco Never  Tobacco Comments   Quit smoking cigarettes 06/07/2023  was smoking 2 cigarettes a day  several months before surgery    Goals Met:  Independence with exercise equipment Exercise tolerated well No report of concerns or symptoms today  Goals Unmet:  Not Applicable  Comments: Pt able to follow exercise prescription today without complaint.  Will continue to monitor for progression.    Dr. Firman Hughes is Medical Director for [facility] Cardiac Rehabilitation.  Dr. Fuad Aleskerov is Medical Director for [facility] Pulmonary Rehabilitation.

## 2023-10-04 ENCOUNTER — Encounter: Payer: Medicare Other | Admitting: *Deleted

## 2023-10-04 DIAGNOSIS — Z9889 Other specified postprocedural states: Secondary | ICD-10-CM

## 2023-10-04 NOTE — Progress Notes (Signed)
 Daily Session Note  Patient Details  Name: Rhonda [name] MRN: [id number] Date of Birth: 03-07-1956 Referring Provider:   Flowsheet Row Cardiac Rehab from 07/24/2023 in [facility] Cardiac and Pulmonary Rehab  Referring Provider Dr. Grady Lawman, MD       Encounter Date: 10/04/2023  Check In:  Session Check In - 10/04/23 1425       Check-In   Supervising physician immediately available to respond to emergencies See telemetry face sheet for immediately available ER MD    Location [facility]-Cardiac & Pulmonary Rehab    Staff Present Maxon Conetta BS, Exercise Physiologist;Noah Tickle, BS, Exercise Physiologist;Joseph Hood RCP,RRT,BSRT;Maud Sorenson, RN, BSN, CCRP    Virtual Visit No    Medication changes reported     No    Fall or balance concerns reported    No    Warm-up and Cool-down Performed on first and last piece of equipment    Resistance Training Performed Yes    VAD Patient? No    PAD/SET Patient? No      Pain Assessment   Currently in Pain? No/denies                Social History   Tobacco Use  Smoking Status Former   Current packs/day: 0.20   Types: Cigarettes  Smokeless Tobacco Never  Tobacco Comments   Quit smoking cigarettes 06/07/2023  was smoking 2 cigarettes a day  several months before surgery    Goals Met:  Independence with exercise equipment Exercise tolerated well No report of concerns or symptoms today  Goals Unmet:  Not Applicable  Comments: Pt able to follow exercise prescription today without complaint.  Will continue to monitor for progression.    Dr. Firman Hughes is Medical Director for [facility] Cardiac Rehabilitation.  Dr. Fuad Aleskerov is Medical Director for [facility] Pulmonary Rehabilitation.

## 2023-10-05 MED ORDER — AMOXICILLIN 500 MG PO CAPS: ORAL_CAPSULE | ORAL | 0 refills | Status: AC

## 2023-10-06 ENCOUNTER — Encounter: Payer: Medicare Other | Attending: Internal Medicine | Admitting: *Deleted

## 2023-10-06 DIAGNOSIS — Z9889 Other specified postprocedural states: Secondary | ICD-10-CM | POA: Diagnosis present

## 2023-10-06 NOTE — Progress Notes (Signed)
 Daily Session Note  Patient Details  Name: Rhonda [name] MRN: [id number] Date of Birth: 03-04-1956 Referring Provider:   Flowsheet Row Cardiac Rehab from 07/24/2023 in [facility] Cardiac and Pulmonary Rehab  Referring Provider Dr. Grady Lawman, MD       Encounter Date: 10/06/2023  Check In:  Session Check In - 10/06/23 1139       Check-In   Supervising physician immediately available to respond to emergencies See telemetry face sheet for immediately available ER MD    Location [facility]-Cardiac & Pulmonary Rehab    Staff Present Maxon Conetta BS, Exercise Physiologist;Noah Tickle, BS, Exercise Physiologist;Sagan Wurzel, RN, BSN, CCRP;Joseph Hood RCP,RRT,BSRT    Virtual Visit No    Medication changes reported     No    Fall or balance concerns reported    No    Warm-up and Cool-down Performed on first and last piece of equipment    Resistance Training Performed Yes    VAD Patient? No    PAD/SET Patient? No      Pain Assessment   Currently in Pain? No/denies                Social History   Tobacco Use  Smoking Status Former   Current packs/day: 0.20   Types: Cigarettes  Smokeless Tobacco Never  Tobacco Comments   Quit smoking cigarettes 06/07/2023  was smoking 2 cigarettes a day  several months before surgery    Goals Met:  Independence with exercise equipment Exercise tolerated well No report of concerns or symptoms today  Goals Unmet:  Not Applicable  Comments: Pt able to follow exercise prescription today without complaint.  Will continue to monitor for progression.    Dr. Firman Hughes is Medical Director for [facility] Cardiac Rehabilitation.  Dr. Fuad Aleskerov is Medical Director for [facility] Pulmonary Rehabilitation.

## 2023-10-09 ENCOUNTER — Encounter: Payer: Medicare Other | Admitting: *Deleted

## 2023-10-09 DIAGNOSIS — Z9889 Other specified postprocedural states: Secondary | ICD-10-CM | POA: Diagnosis not present

## 2023-10-09 NOTE — Progress Notes (Signed)
 Daily Session Note  Patient Details  Name: Rhonda [name] MRN: [id number] Date of Birth: [birth year] Referring Provider:   Flowsheet Row Cardiac Rehab from 07/24/2023 in [facility] Cardiac and Pulmonary Rehab  Referring Provider Dr. Grady Lawman, MD       Encounter Date: 10/09/2023  Check In:  Session Check In - 10/09/23 1125       Check-In   Supervising physician immediately available to respond to emergencies See telemetry face sheet for immediately available ER MD    Location [facility]-Cardiac & Pulmonary Rehab    Staff Present Maxon Beauford Bounds, Exercise Physiologist;Joseph Hood RCP,RRT,BSRT;Tamsin Nader RN,BSN;Kelly [place] BS, ACSM CEP, Exercise Physiologist    Virtual Visit No    Medication changes reported     No    Fall or balance concerns reported    No    Warm-up and Cool-down Performed on first and last piece of equipment    Resistance Training Performed Yes    VAD Patient? No    PAD/SET Patient? No      Pain Assessment   Currently in Pain? No/denies                Social History   Tobacco Use  Smoking Status Former   Current packs/day: 0.20   Types: Cigarettes  Smokeless Tobacco Never  Tobacco Comments   Quit smoking cigarettes 06/07/2023  was smoking 2 cigarettes a day  several months before surgery    Goals Met:  Proper associated with RPD/PD & O2 Sat Independence with exercise equipment Exercise tolerated well No report of concerns or symptoms today Strength training completed today  Goals Unmet:  Not Applicable  Comments: Pt able to follow exercise prescription today without complaint.  Will continue to monitor for progression.    Dr. Firman Hughes is Medical Director for [facility] Cardiac Rehabilitation.  Dr. Fuad Aleskerov is Medical Director for [facility] Pulmonary Rehabilitation.

## 2023-10-11 ENCOUNTER — Encounter: Payer: Self-pay | Admitting: *Deleted

## 2023-10-11 ENCOUNTER — Encounter: Payer: Medicare Other | Admitting: *Deleted

## 2023-10-11 DIAGNOSIS — Z9889 Other specified postprocedural states: Secondary | ICD-10-CM

## 2023-10-11 NOTE — Progress Notes (Signed)
 Cardiac Individual Treatment Plan  Patient Details  Name: Rhonda [name] MRN: [id number] Date of Birth: 03/10/1956 Referring Provider:   Flowsheet Row Cardiac Rehab from 07/24/2023 in [facility] Cardiac and Pulmonary Rehab  Referring Provider Dr. Grady Lawman, MD       Initial Encounter Date:  Flowsheet Row Cardiac Rehab from 07/24/2023 in [facility] Cardiac and Pulmonary Rehab  Date 07/24/23       Visit Diagnosis: S/P MVR (mitral valve repair)  S/P tricuspid valve repair  Patient's Home Medications on Admission:  Current Outpatient Medications:    acetaminophen  (TYLENOL ) 500 MG tablet, Take 500 mg by mouth every 6 (six) hours as needed., Disp: , Rfl:    albuterol  (PROVENTIL  HFA;VENTOLIN  HFA) 108 (90 Base) MCG/ACT inhaler, Inhale 2 puffs into the lungs every 6 (six) hours as needed for wheezing or shortness of breath., Disp: 1 Inhaler, Rfl: 2   amoxicillin  (AMOXIL ) 500 MG capsule, Take 4 capsules by mouth one time 30-60 minutes prior to dental appointment, Disp: 4 capsule, Rfl: 0   apixaban  (ELIQUIS ) 5 MG TABS tablet, Take 1 tablet (5 mg total) by mouth 2 (two) times daily., Disp: 60 tablet, Rfl: 1   atorvastatin  (LIPITOR) 20 MG tablet, Take 1 tablet (20 mg total) by mouth every evening., Disp: 90 tablet, Rfl: 3   digoxin  (LANOXIN ) 0.125 MG tablet, Take 0.5 tablets (0.0625 mg total) by mouth daily., Disp: 45 tablet, Rfl: 3   furosemide  (LASIX ) 40 MG tablet, Take 40 mg by mouth daily., Disp: , Rfl:    Homeopathic Products [facility] MENTAL FOCUS PO), Take 2 capsules by mouth in the morning. Amare MentaFocus, Disp: , Rfl:    metoprolol  tartrate (LOPRESSOR ) 25 MG tablet, Take 1 tablet (25 mg total) by mouth 2 (two) times daily., Disp: 180 tablet, Rfl: 2   Multiple Vitamins-Iron  (MULTIVITAMINS WITH IRON ) TABS tablet, Take 1 tablet by mouth daily., Disp: , Rfl:    NON FORMULARY, Take 2 capsules by mouth in the morning. Mood+ by [employer], Disp: , Rfl:    OVER THE COUNTER MEDICATION,  Take 1 packet by mouth daily. Amare Edge+ Grape Plant-Based Nootropics, Disp: , Rfl:    pantoprazole  (PROTONIX ) 40 MG tablet, TAKE 1 TABLET BY MOUTH EVERY DAY, Disp: 90 tablet, Rfl: 3   Probiotic Product (PROBIOTIC PO), Take 1 packet by mouth daily. MentaBiotics Advance Gut Brain Nutrition Prebiotic/Probiotic/Phytobiotics, Disp: , Rfl:    spironolactone  (ALDACTONE ) 100 MG tablet, Take 100 mg by mouth daily., Disp: , Rfl:   Past Medical History: Past Medical History:  Diagnosis Date   Allergy    Atrial fibrillation (HCC)    CAD (coronary artery disease) 08/2009   BMS to RCA, non-obstructive in CX and LAD 2011   Cataract    CHF (congestive heart failure) (HCC)    Cirrhosis (HCC)    Dysrhythmia    A. Fib   Edema, peripheral    Heart murmur    Hypertension    Myocardial infarction [facility])    Nausea    Pneumonia    January 2020   Sleep apnea    No CPAP   Trichomoniasis 06/05/2018    Tobacco Use: Social History   Tobacco Use  Smoking Status Former   Current packs/day: 0.20   Types: Cigarettes  Smokeless Tobacco Never  Tobacco Comments   Quit smoking cigarettes 06/07/2023  was smoking 2 cigarettes a day  several months before surgery    Labs: Review Flowsheet  More data exists  Latest Ref Rng & Units 03/31/2023 06/12/2023 06/14/2023 06/15/2023 06/16/2023  Labs for ITP Cardiac and Pulmonary Rehab  Hemoglobin A1c 4.8 - 5.6 % - 6.2  - - -  PH, Arterial 7.35 - 7.45 7.361  - 7.255  7.233  7.200  7.326  7.414  7.370  7.446  7.301  7.269  7.281  7.259  7.433  7.237  7.281   PCO2 arterial 32 - 48 mmHg 46.4  - 56.1  64.3  65.4  49.2  41.7  47.4  37.9  56.5  54.7  56.2  54.0  34.7  58.4  55.7   Bicarbonate 20.0 - 28.0 mmol/L 26.3  27.8  - 25.1  27.3  25.7  25.9  26.7  27.4  26.4  26.1  27.8  25.1  26.5  24.1  23.1  24.7  26.2   TCO2 22 - 32 mmol/L 28  29  - 27  29  28  27  28  27  29  30  28  27  27  30  29  27  28  26  24  26  28    Acid-base deficit 0.0 - 2.0 mmol/L - - 3.0  1.0  4.0   1.0  2.0  1.0  3.0  1.0  3.0  1.0   O2 Saturation % 89  63  - 94  93  93  97  100  100  80  100  100  93  95  93  94  93  93     Details       Multiple values from one day are sorted in reverse-chronological order          Exercise Target Goals: Exercise Program Goal: Individual exercise prescription set using results from initial 6 min walk test and THRR while considering  patient's activity barriers and safety.   Exercise Prescription Goal: Initial exercise prescription builds to 30-45 minutes a day of aerobic activity, 2-3 days per week.  Home exercise guidelines will be given to patient during program as part of exercise prescription that the participant will acknowledge.   Education: Aerobic Exercise: - Group verbal and visual presentation on the components of exercise prescription. Introduces F.I.T.T principle from ACSM for exercise prescriptions.  Reviews F.I.T.T. principles of aerobic exercise including progression. Written material given at graduation. Flowsheet Row Cardiac Rehab from 07/24/2023 in [facility] Cardiac and Pulmonary Rehab  Education need identified 07/24/23       Education: Resistance Exercise: - Group verbal and visual presentation on the components of exercise prescription. Introduces F.I.T.T principle from ACSM for exercise prescriptions  Reviews F.I.T.T. principles of resistance exercise including progression. Written material given at graduation.    Education: Exercise & Equipment Safety: - Individual verbal instruction and demonstration of equipment use and safety with use of the equipment. Flowsheet Row Cardiac Rehab from 07/24/2023 in [facility] Cardiac and Pulmonary Rehab  Date 07/24/23  Educator NT  Instruction Review Code 1- Verbalizes Understanding       Education: Exercise Physiology & General Exercise Guidelines: - Group verbal and written instruction with models to review the exercise physiology of the cardiovascular system and associated critical  values. Provides general exercise guidelines with specific guidelines to those with heart or lung disease.    Education: Flexibility, Balance, Mind/Body Relaxation: - Group verbal and visual presentation with interactive activity on the components of exercise prescription. Introduces F.I.T.T principle from ACSM for exercise prescriptions. Reviews F.I.T.T. principles of flexibility and balance exercise  training including progression. Also discusses the mind body connection.  Reviews various relaxation techniques to help reduce and manage stress (i.e. Deep breathing, progressive muscle relaxation, and visualization). Balance handout provided to take home. Written material given at graduation.   Activity Barriers & Risk Stratification:  Activity Barriers & Cardiac Risk Stratification - 07/24/23 1556       Activity Barriers & Cardiac Risk Stratification   Activity Barriers Joint Problems   R knee pain   Cardiac Risk Stratification Moderate             6 Minute Walk:  6 Minute Walk     Row Name 07/24/23 1557         6 Minute Walk   Phase Initial     Distance 1125 feet     Walk Time 6 minutes     # of Rest Breaks 0     MPH 2.13     METS 2.6     RPE 9     Perceived Dyspnea  0     VO2 Peak 9.09     Symptoms No     Resting HR 72 bpm     Resting BP 106/60     Resting Oxygen Saturation  97 %     Exercise Oxygen Saturation  during 6 min walk 95 %     Max Ex. HR 95 bpm     Max Ex. BP 120/64     2 Minute Post BP 116/62              Oxygen Initial Assessment:  Oxygen Initial Assessment - 07/03/23 1437       Home Oxygen   Home Oxygen Device Home Concentrator;E-Tanks    Sleep Oxygen Prescription Continuous    Liters per minute 3    Home Exercise Oxygen Prescription Continuous    Liters per minute 3    Home Resting Oxygen Prescription Continuous    Liters per minute 3    Compliance with Home Oxygen Use Yes      Intervention   Short Term Goals To learn and exhibit  compliance with exercise, home and travel O2 prescription;To learn and understand importance of monitoring SPO2 with pulse oximeter and demonstrate accurate use of the pulse oximeter.;To learn and understand importance of maintaining oxygen saturations>88%;To learn and demonstrate proper pursed lip breathing techniques or other breathing techniques. ;To learn and demonstrate proper use of respiratory medications    Long  Term Goals Exhibits compliance with exercise, home  and travel O2 prescription;Verbalizes importance of monitoring SPO2 with pulse oximeter and return demonstration;Maintenance of O2 saturations>88%;Exhibits proper breathing techniques, such as pursed lip breathing or other method taught during program session;Compliance with respiratory medication;Demonstrates proper use of MDI's             Oxygen Re-Evaluation:   Oxygen Discharge (Final Oxygen Re-Evaluation):   Initial Exercise Prescription:  Initial Exercise Prescription - 07/24/23 1600       Date of Initial Exercise RX and Referring Provider   Date 07/24/23    Referring Provider Dr. Grady Lawman, MD      Oxygen   Maintain Oxygen Saturation 88% or higher      Treadmill   MPH 2.1    Grade 0    Minutes 15    METs 2.61      Recumbant Bike   Level 2    RPM 50    Watts 15    Minutes 15    METs 2.6  NuStep   Level 2    SPM 80    Minutes 15    METs 2.6      Prescription Details   Frequency (times per week) 3    Duration Progress to 30 minutes of continuous aerobic without signs/symptoms of physical distress      Intensity   THRR 40-80% of Max Heartrate 105-138    Ratings of Perceived Exertion 11-13    Perceived Dyspnea 0-4      Progression   Progression Continue to progress workloads to maintain intensity without signs/symptoms of physical distress.      Resistance Training   Training Prescription Yes    Weight 4 lb    Reps 10-15             Perform Capillary Blood Glucose  checks as needed.  Exercise Prescription Changes:   Exercise Prescription Changes     Row Name 07/24/23 1600 08/08/23 1400 08/22/23 1400 09/06/23 1500 09/21/23 1600     Response to Exercise   Blood Pressure (Admit) 106/60 104/64 122/60 102/58 118/60   Blood Pressure (Exercise) 120/64 144/70 138/72 148/70 --   Blood Pressure (Exit) 116/62 98/60 108/60 102/54 108/54   Heart Rate (Admit) 72 bpm 71 bpm 82 bpm 91 bpm 79 bpm   Heart Rate (Exercise) 95 bpm 140 bpm 98 bpm 118 bpm 109 bpm   Heart Rate (Exit) 81 bpm 71 bpm 82 bpm 68 bpm 78 bpm   Oxygen Saturation (Admit) 97 % -- -- -- 96 %   Oxygen Saturation (Exercise) 95 % -- -- -- 92 %   Oxygen Saturation (Exit) -- -- -- -- 95 %   Rating of Perceived Exertion (Exercise) 9 11 12 12 13    Perceived Dyspnea (Exercise) 0 -- -- -- --   Symptoms none none none none none   Comments Results First three days of exercise -- -- --   Duration -- Continue with 30 min of aerobic exercise without signs/symptoms of physical distress. Continue with 30 min of aerobic exercise without signs/symptoms of physical distress. Continue with 30 min of aerobic exercise without signs/symptoms of physical distress. Continue with 30 min of aerobic exercise without signs/symptoms of physical distress.   Intensity -- THRR unchanged THRR unchanged THRR unchanged THRR unchanged     Progression   Progression -- Continue to progress workloads to maintain intensity without signs/symptoms of physical distress. Continue to progress workloads to maintain intensity without signs/symptoms of physical distress. Continue to progress workloads to maintain intensity without signs/symptoms of physical distress. Continue to progress workloads to maintain intensity without signs/symptoms of physical distress.   Average METs -- 2.52 2.85 2.84 2.61     Resistance Training   Training Prescription -- Yes Yes Yes Yes   Weight -- 4 lb 4 lb 4 lb 4 lb   Reps -- 10-15 10-15 10-15 10-15      Interval Training   Interval Training -- No No No No     Treadmill   MPH -- 2.2 2.5 2.5 2.5   Grade -- 0 0 0 0   Minutes -- 15 15 15 15    METs -- 2.69 2.91 2.91 2.91     NuStep   Level -- 4  T6: level 3 5 5  --   Minutes -- 15 15 15  --   METs -- 2.8  T6: 2.3 METs 2.9 2.8 --     REL-XR   Level -- -- 2 5 5    Minutes -- --  15 15 15    METs -- -- 3.2 -- --     T5 Nustep   Level -- -- -- -- 3   Minutes -- -- -- -- 15   METs -- -- -- -- 2.3     Track   Laps -- -- 35 -- --   Minutes -- -- 15 -- --   METs -- -- 2.9 -- --     Oxygen   Maintain Oxygen Saturation -- 88% or higher 88% or higher 88% or higher 88% or higher    Row Name 10/04/23 0800             Response to Exercise   Blood Pressure (Admit) 104/58       Blood Pressure (Exit) 104/62       Heart Rate (Admit) 69 bpm       Heart Rate (Exercise) 130 bpm       Heart Rate (Exit) 74 bpm       Oxygen Saturation (Admit) 93 %       Oxygen Saturation (Exercise) 92 %       Oxygen Saturation (Exit) 92 %       Rating of Perceived Exertion (Exercise) 12       Symptoms none       Duration Continue with 30 min of aerobic exercise without signs/symptoms of physical distress.       Intensity THRR unchanged         Progression   Progression Continue to progress workloads to maintain intensity without signs/symptoms of physical distress.       Average METs 2.75         Resistance Training   Training Prescription Yes       Weight 4 lb       Reps 10-15         Interval Training   Interval Training No         Treadmill   MPH 2.6       Grade 0       Minutes 15       METs 2.99         REL-XR   Level 5       Minutes 15         T5 Nustep   Level 2       Minutes 15       METs 2         Oxygen   Maintain Oxygen Saturation 88% or higher                Exercise Comments:   Exercise Comments     Row Name 07/31/23 1115           Exercise Comments First full day of exercise!  Patient was oriented to gym  and equipment including functions, settings, policies, and procedures.  Patient's individual exercise prescription and treatment plan were reviewed.  All starting workloads were established based on the results of the 6 minute walk test done at initial orientation visit.  The plan for exercise progression was also introduced and progression will be customized based on patient's performance and goals.                Exercise Goals and Review:   Exercise Goals     Row Name 07/24/23 1208             Exercise Goals   Increase Physical Activity Yes       Intervention  Develop an individualized exercise prescription for aerobic and resistive training based on initial evaluation findings, risk stratification, comorbidities and participant's personal goals.;Provide advice, education, support and counseling about physical activity/exercise needs.       Expected Outcomes Long Term: Exercising regularly at least 3-5 days a week.;Long Term: Add in home exercise to make exercise part of routine and to increase amount of physical activity.;Short Term: Attend rehab on a regular basis to increase amount of physical activity.       Increase Strength and Stamina Yes       Intervention Provide advice, education, support and counseling about physical activity/exercise needs.;Develop an individualized exercise prescription for aerobic and resistive training based on initial evaluation findings, risk stratification, comorbidities and participant's personal goals.       Expected Outcomes Short Term: Increase workloads from initial exercise prescription for resistance, speed, and METs.;Short Term: Perform resistance training exercises routinely during rehab and add in resistance training at home;Long Term: Improve cardiorespiratory fitness, muscular endurance and strength as measured by increased METs and functional capacity ( )       Able to understand and use rate of perceived exertion (RPE) scale Yes        Intervention Provide education and explanation on how to use RPE scale       Expected Outcomes Short Term: Able to use RPE daily in rehab to express subjective intensity level;Long Term:  Able to use RPE to guide intensity level when exercising independently       Able to understand and use Dyspnea scale Yes       Intervention Provide education and explanation on how to use Dyspnea scale       Expected Outcomes Short Term: Able to use Dyspnea scale daily in rehab to express subjective sense of shortness of breath during exertion;Long Term: Able to use Dyspnea scale to guide intensity level when exercising independently       Knowledge and understanding of Target Heart Rate Range (THRR) Yes       Intervention Provide education and explanation of THRR including how the numbers were predicted and where they are located for reference       Expected Outcomes Short Term: Able to state/look up THRR;Long Term: Able to use THRR to govern intensity when exercising independently;Short Term: Able to use daily as guideline for intensity in rehab       Able to check pulse independently Yes       Intervention Provide education and demonstration on how to check pulse in carotid and radial arteries.;Review the importance of being able to check your own pulse for safety during independent exercise       Expected Outcomes Short Term: Able to explain why pulse checking is important during independent exercise;Long Term: Able to check pulse independently and accurately       Understanding of Exercise Prescription Yes       Intervention Provide education, explanation, and written materials on patient's individual exercise prescription       Expected Outcomes Short Term: Able to explain program exercise prescription;Long Term: Able to explain home exercise prescription to exercise independently                Exercise Goals Re-Evaluation :  Exercise Goals Re-Evaluation     Row Name 07/31/23 1115 08/08/23 1435  08/22/23 1405 09/06/23 1542 09/21/23 1640     Exercise Goal Re-Evaluation   Exercise Goals Review Able to understand and use rate of perceived exertion (RPE) scale;Able to  understand and use Dyspnea scale;Knowledge and understanding of Target Heart Rate Range (THRR);Understanding of Exercise Prescription Understanding of Exercise Prescription;Increase Physical Activity;Increase Strength and Stamina Understanding of Exercise Prescription;Increase Physical Activity;Increase Strength and Stamina Understanding of Exercise Prescription;Increase Physical Activity;Increase Strength and Stamina Understanding of Exercise Prescription;Increase Physical Activity;Increase Strength and Stamina   Comments Reviewed RPE and dyspnea scale, THR and program prescription with pt today.  Pt voiced understanding and was given a copy of goals to take home. [name] is off to a good start in the program. She was able to walk the treadmill at a speed of 2.2 mph with no incline during her first week of rehab. She also did well working at level 3 on the T6 nustep and level 4 on the T4 nustep. We will continue to monitor her progress in the program. [name] is doing well in rehab. She recently increased her treadmill speed to 2.5 mph with no incline. She also improved to level 5 on the T4 nustep and began using the XR at level 2. We will continue to monitor her progress in the program. [name] continues to do well in the program. She has continued to walk the treadmill at a speed of 2.5 mph with no incline. She also improved to level 5 on the XR and has stayed consistent at level 5 on the T4 nustep. We will continue to monitor her progress in the program. [name] is doing well in rehab. She was able to maintain her workload on the treadmill at a speed of 2. and no incline. She was also able to use the T5 nustep at level 3. We will continue to monitor her progress in the program.   Expected Outcomes Short: Use RPE daily to regulate intensity. Long:  Follow program prescription in THR. Short: Continue to follow current exercise prescription. Long: Continue exercise to improve strength and stamina. Short: Add incline to treadmill workload. Long: Continue exercise to improve strength and stamina. Short: Add incline to treadmill workload. Long: Continue exercise to improve strength and stamina. Short: Add incline to treadmill workload. Long: Continue exercise to improve strength and stamina.    Row Name 10/02/23 1133 10/04/23 0811           Exercise Goal Re-Evaluation   Exercise Goals Review Increase Physical Activity;Increase Strength and Stamina;Understanding of Exercise Prescription Increase Physical Activity;Increase Strength and Stamina;Understanding of Exercise Prescription      Comments Oralee Billow is doing well in rehab, she says she is walking some at home but would like to exercise more on her own. Encouraged her to look into a gym to be able to do exercises she is doing here and have a good long term plan once she graduates. [name] continues to do well in rehab and will be due for her post 6-minute walk test soon. She increased her workload on the treadmill to a speed of 2.6 mph and no incline. She maintained level 5 on the XR and decreased to level 2 on the T5 nustep from level 3. We will continue to monitor her progress in the program.      Expected Outcomes STG: increase workload as able. LTG: Contineu exercise ti improve strength and stamina Short: Try level 6 on the XR and improve on post . Long: Continue to increase overall METs and stamina.               Discharge Exercise Prescription (Final Exercise Prescription Changes):  Exercise Prescription Changes - 10/04/23 0800  Response to Exercise   Blood Pressure (Admit) 104/58    Blood Pressure (Exit) 104/62    Heart Rate (Admit) 69 bpm    Heart Rate (Exercise) 130 bpm    Heart Rate (Exit) 74 bpm    Oxygen Saturation (Admit) 93 %    Oxygen Saturation (Exercise) 92 %     Oxygen Saturation (Exit) 92 %    Rating of Perceived Exertion (Exercise) 12    Symptoms none    Duration Continue with 30 min of aerobic exercise without signs/symptoms of physical distress.    Intensity THRR unchanged      Progression   Progression Continue to progress workloads to maintain intensity without signs/symptoms of physical distress.    Average METs 2.75      Resistance Training   Training Prescription Yes    Weight 4 lb    Reps 10-15      Interval Training   Interval Training No      Treadmill   MPH 2.6    Grade 0    Minutes 15    METs 2.99      REL-XR   Level 5    Minutes 15      T5 Nustep   Level 2    Minutes 15    METs 2      Oxygen   Maintain Oxygen Saturation 88% or higher             Nutrition:  Target Goals: Understanding of nutrition guidelines, daily intake of sodium 1500mg , cholesterol 200mg , calories 30% from fat and 7% or less from saturated fats, daily to have 5 or more servings of fruits and vegetables.  Education: All About Nutrition: -Group instruction provided by verbal, written material, interactive activities, discussions, models, and posters to present general guidelines for heart healthy nutrition including fat, fiber, MyPlate, the role of sodium in heart healthy nutrition, utilization of the nutrition label, and utilization of this knowledge for meal planning. Follow up email sent as well. Written material given at graduation. Flowsheet Row Cardiac Rehab from 07/24/2023 in [facility] Cardiac and Pulmonary Rehab  Education need identified 07/24/23       Biometrics:  Pre Biometrics - 07/24/23 1556       Pre Biometrics   Height 5\' 4"  (1.626 m)    Weight 169 lb 9.6 oz (76.9 kg)    Waist Circumference 35 inches    Hip Circumference 37 inches    Waist to Hip Ratio 0.95 %    BMI (Calculated) 29.1    Single Leg Stand 8.4 seconds              Nutrition Therapy Plan and Nutrition Goals:  Nutrition Therapy & Goals - 07/24/23  1351       Nutrition Therapy   Diet Cardiac, Low Na    Protein (specify units) 70-90g    Fiber 25 grams    Whole Grain Foods 3 servings    Saturated Fats 15 max. grams    Fruits and Vegetables 5 servings/day    Sodium 2 grams      Personal Nutrition Goals   Nutrition Goal Eat 15-30gProtein and 30-60gCarbs at each meal.    Personal Goal #2 Read labels and reduce sodium intake to below 2300mg . Ideally 1500mg  per day.    Personal Goal #3 Reduce saturated fat, less than 12g per day. Replace bad fats for more heart healthy fats.    Comments Patient drinking 20oz of diet coke, cup of coffee most  mornings. She usually eats breakfast, but then wont eat lunch on the days she ate breakfast. She likes salads and often does them for dinner. Reviewed mediterranean diet handout, educated on types of fats, sources, and how to read labels. She reports she reads labels often, was apart of weight watchers for many years. She doesn't salt her foods and tries to keep sodium intake low. Provided her with goal of less than 1500mg  sodium daily. Encouraged her to eat 3 times per day use small protein snack paired with a complex carb. She likes cottage cheese and peaches.      Intervention Plan   Intervention Prescribe, educate and counsel regarding individualized specific dietary modifications aiming towards targeted core components such as weight, hypertension, lipid management, diabetes, heart failure and other comorbidities.;Nutrition handout(s) given to patient.    Expected Outcomes Short Term Goal: Understand basic principles of dietary content, such as calories, fat, sodium, cholesterol and nutrients.;Short Term Goal: A plan has been developed with personal nutrition goals set during dietitian appointment.;Long Term Goal: Adherence to prescribed nutrition plan.             Nutrition Assessments:  MEDIFICTS Score Key: >=70 Need to make dietary changes  40-70 Heart Healthy Diet <= 40 Therapeutic Level  Cholesterol Diet  Flowsheet Row Cardiac Rehab from 07/24/2023 in [facility] Cardiac and Pulmonary Rehab  Picture Your Plate Total Score on Admission 71      Picture Your Plate Scores: <96 Unhealthy dietary pattern with much room for improvement. 41-50 Dietary pattern unlikely to meet recommendations for good health and room for improvement. 51-60 More healthful dietary pattern, with some room for improvement.  >60 Healthy dietary pattern, although there may be some specific behaviors that could be improved.    Nutrition Goals Re-Evaluation:  Nutrition Goals Re-Evaluation     Row Name 09/04/23 1143 10/02/23 1137           Goals   Nutrition Goal Eat 15-30gProtein and 30-60gCarbs at each meal. --      Comment [name] reports that she is eating more protein, mostly chicken, yoghurt, oatmeal with protein. She reads food lables and is making mindful choices to reduce sodium and saturated fat. She eats mostly fresh or frozen vegetables and if she gets canned she gets reduced sodium choices. [name] reports she is doing well with nutrition. When asked what changes she felt she was most proud of, she says eating less carbs and more protien. States this has improved her energy level as well.      Expected Outcome Short: continue to read food labels and eat fresh fruits and veggies. Long: maintain heart healhty diet. STG: Continue to focus on balanced meals. LTG: maintain a heart healthy diet        Personal Goal #2 Re-Evaluation   Personal Goal #2 Read labels and reduce sodium intake to below 2300mg . Ideally 1500mg  per day. --        Personal Goal #3 Re-Evaluation   Personal Goal #3 Reduce saturated fat, less than 12g per day. Replace bad fats for more heart healthy fats. --               Nutrition Goals Discharge (Final Nutrition Goals Re-Evaluation):  Nutrition Goals Re-Evaluation - 10/02/23 1137       Goals   Comment [name] reports she is doing well with nutrition. When asked what changes she felt  she was most proud of, she says eating less carbs and more protien. States this has improved  her energy level as well.    Expected Outcome STG: Continue to focus on balanced meals. LTG: maintain a heart healthy diet             Psychosocial: Target Goals: Acknowledge presence or absence of significant depression and/or stress, maximize coping skills, provide positive support system. Participant is able to verbalize types and ability to use techniques and skills needed for reducing stress and depression.   Education: Stress, Anxiety, and Depression - Group verbal and visual presentation to define topics covered.  Reviews how body is impacted by stress, anxiety, and depression.  Also discusses healthy ways to reduce stress and to treat/manage anxiety and depression.  Written material given at graduation.   Education: Sleep Hygiene -Provides group verbal and written instruction about how sleep can affect your health.  Define sleep hygiene, discuss sleep cycles and impact of sleep habits. Review good sleep hygiene tips.    Initial Review & Psychosocial Screening:  Initial Psych Review & Screening - 07/03/23 1428       Initial Review   Current issues with None Identified      Family Dynamics   Good Support System? Yes   daughter, neighbor, church family     Barriers   Psychosocial barriers to participate in program There are no identifiable barriers or psychosocial needs.      Screening Interventions   Interventions To provide support and resources with identified psychosocial needs;Provide feedback about the scores to participant;Encouraged to exercise    Expected Outcomes Short Term goal: Utilizing psychosocial counselor, staff and physician to assist with identification of specific Stressors or current issues interfering with healing process. Setting desired goal for each stressor or current issue identified.;Long Term Goal: Stressors or current issues are controlled or  eliminated.;Short Term goal: Identification and review with participant of any Quality of Life or Depression concerns found by scoring the questionnaire.;Long Term goal: The participant improves quality of Life and PHQ9 Scores as seen by post scores and/or verbalization of changes             Quality of Life Scores:   Quality of Life - 07/24/23 1206       Quality of Life   Select Quality of Life      Quality of Life Scores   Health/Function Pre 25.43 %    Socioeconomic Pre 23.31 %    Psych/Spiritual Pre 29.64 %    Family Pre 25.3 %    GLOBAL Pre 25.77 %            Scores of 19 and below usually indicate a poorer quality of life in these areas.  A difference of  2-3 points is a clinically meaningful difference.  A difference of 2-3 points in the total score of the Quality of Life Index has been associated with significant improvement in overall quality of life, self-image, physical symptoms, and general health in studies assessing change in quality of life.  PHQ-9: Review Flowsheet       07/24/2023  Depression screen PHQ 2/9  Decreased Interest 0  Down, Depressed, Hopeless 0  PHQ - 2 Score 0  Altered sleeping 0  Tired, decreased energy 0  Change in appetite 0  Feeling bad or failure about yourself  0  Trouble concentrating 0  Moving slowly or fidgety/restless 0  Suicidal thoughts 0  PHQ-9 Score 0   Interpretation of Total Score  Total Score Depression Severity:  1-4 = Minimal depression, 5-9 = Mild depression, 10-14 = Moderate  depression, 15-19 = Moderately severe depression, 20-27 = Severe depression   Psychosocial Evaluation and Intervention:  Psychosocial Evaluation - 07/03/23 1439       Psychosocial Evaluation & Interventions   Interventions Encouraged to exercise with the program and follow exercise prescription    Comments There are no barriers to her attending the program. She lives with a roommate in her condo. She has support from her daughter,  neighbors and her church family.  She is ready to start the prgoram and work on building strength and stamina and continue healing from her surgery. She is wearing oxygen, was told it is short term, and she is ready to get rid of it as soon as her physician says it time .  She quit smoking right before her surgery, was down to 2 cigarettes a day.    Expected Outcomes STG attend all scheduled sessions, follow the exercise progression. use her tobacco cessation resources to stay quit. LTG continue her exercise progression, stay quit with tobacco, use her [place] Quitline resource and MD if needed for help    Continue Psychosocial Services  Follow up required by staff             Psychosocial Re-Evaluation:  Psychosocial Re-Evaluation     Row Name 09/04/23 1148 10/02/23 1135           Psychosocial Re-Evaluation   Current issues with None Identified --      Comments [name] reports no concerns with sleep, stress, or mental health. She still has a good support system. She feels well rested when she gets up in the morning and has energy thoughout the day. [name] reports she has a good support system, denies any stress, anxiety or depression. Sleeping well, ~8hrs per night, wakes well rested.      Expected Outcomes Short: continue to attend cardiac rehab for exercise and its mental health benefits. Long: maintain good mental health habits. STG: Continue to exercise at rehab and sleep well. LTG: maintain good mental health habits.      Interventions Encouraged to attend Cardiac Rehabilitation for the exercise Encouraged to attend Cardiac Rehabilitation for the exercise      Continue Psychosocial Services  Follow up required by staff Follow up required by staff               Psychosocial Discharge (Final Psychosocial Re-Evaluation):  Psychosocial Re-Evaluation - 10/02/23 1135       Psychosocial Re-Evaluation   Comments [name] reports she has a good support system, denies any stress, anxiety or  depression. Sleeping well, ~8hrs per night, wakes well rested.    Expected Outcomes STG: Continue to exercise at rehab and sleep well. LTG: maintain good mental health habits.    Interventions Encouraged to attend Cardiac Rehabilitation for the exercise    Continue Psychosocial Services  Follow up required by staff             Vocational Rehabilitation: Provide vocational rehab assistance to qualifying candidates.   Vocational Rehab Evaluation & Intervention:  Vocational Rehab - 07/03/23 1432       Initial Vocational Rehab Evaluation & Intervention   Assessment shows need for Vocational Rehabilitation No      Vocational Rehab Re-Evaulation   Comments no need for VR             Education: Education Goals: Education classes will be provided on a variety of topics geared toward better understanding of heart health and risk factor modification. Participant will state understanding/return  demonstration of topics presented as noted by education test scores.  Learning Barriers/Preferences:   General Cardiac Education Topics:  AED/CPR: - Group verbal and written instruction with the use of models to demonstrate the basic use of the AED with the basic ABC's of resuscitation.   Anatomy and Cardiac Procedures: - Group verbal and visual presentation and models provide information about basic cardiac anatomy and function. Reviews the testing methods done to diagnose heart disease and the outcomes of the test results. Describes the treatment choices: Medical Management, Angioplasty, or Coronary Bypass Surgery for treating various heart conditions including Myocardial Infarction, Angina, Valve Disease, and Cardiac Arrhythmias.  Written material given at graduation. Flowsheet Row Cardiac Rehab from 07/24/2023 in [facility] Cardiac and Pulmonary Rehab  Education need identified 07/24/23       Medication Safety: - Group verbal and visual instruction to review commonly prescribed medications  for heart and lung disease. Reviews the medication, class of the drug, and side effects. Includes the steps to properly store meds and maintain the prescription regimen.  Written material given at graduation.   Intimacy: - Group verbal instruction through game format to discuss how heart and lung disease can affect sexual intimacy. Written material given at graduation..   Know Your Numbers and Heart Failure: - Group verbal and visual instruction to discuss disease risk factors for cardiac and pulmonary disease and treatment options.  Reviews associated critical values for Overweight/Obesity, Hypertension, Cholesterol, and Diabetes.  Discusses basics of heart failure: signs/symptoms and treatments.  Introduces Heart Failure Zone chart for action plan for heart failure.  Written material given at graduation.   Infection Prevention: - Provides verbal and written material to individual with discussion of infection control including proper hand washing and proper equipment cleaning during exercise session. Flowsheet Row Cardiac Rehab from 07/24/2023 in [facility] Cardiac and Pulmonary Rehab  Date 07/24/23  Educator NT  Instruction Review Code 1- Verbalizes Understanding       Falls Prevention: - Provides verbal and written material to individual with discussion of falls prevention and safety. Flowsheet Row Cardiac Rehab from 07/24/2023 in [facility] Cardiac and Pulmonary Rehab  Date 07/03/23  Educator SB  Instruction Review Code 1- Verbalizes Understanding       Other: -Provides group and verbal instruction on various topics (see comments)   Knowledge Questionnaire Score:  Knowledge Questionnaire Score - 07/24/23 1206       Knowledge Questionnaire Score   Pre Score 22/26             Core Components/Risk Factors/Patient Goals at Admission:  Personal Goals and Risk Factors at Admission - 07/03/23 1523       Core Components/Risk Factors/Patient Goals on Admission   Number of packs per  day Evangelyne is a former tobacco user. Intervention for tobacco cessation was provided at the initial medical review. She was asked about readiness to quit and reported she quit right before her surgery, had weaned down to 2 cigarettes a day. . Patient was advised and educated about tobacco cessation using combination therapy, tobacco cessation classes, quit line, and quit smoking apps. Patient demonstrated understanding of this material. Staff will continue to provide encouragement and follow up with the patient throughout the program.             Education:Diabetes - Individual verbal and written instruction to review signs/symptoms of diabetes, desired ranges of glucose level fasting, after meals and with exercise. Acknowledge that pre and post exercise glucose checks will be done for 3 sessions  at entry of program.   Core Components/Risk Factors/Patient Goals Review:   Goals and Risk Factor Review     Row Name 09/04/23 1138 10/02/23 1138           Core Components/Risk Factors/Patient Goals Review   Personal Goals Review Tobacco Cessation;Heart Failure;Lipids Tobacco Cessation      Review [name] reports that she is still tobacco free, and going on 3 months with no cigarettes. She monitors her weight regularly and monitors for signs of heart failure. She takes all her cholesterol meds regularly follows up with doctor for lab work. [name] reports she is doing well with no cigarettes, now ~5months tabacco free. Commended and encouraged her to continue this change as its a big one she should be proud of.      Expected Outcomes short: continue to monitor weight and remain tobacco free. Long: continue with lifestyle changes to help control cardiac risk factors. Remain tobacco free for the long term. STG: Continue to be tabacco free. LTG: continue with lifestyle changes to help control cardiac risk factors. Remain tobacco free for the long term.               Core Components/Risk Factors/Patient  Goals at Discharge (Final Review):   Goals and Risk Factor Review - 10/02/23 1138       Core Components/Risk Factors/Patient Goals Review   Personal Goals Review Tobacco Cessation    Review [name] reports she is doing well with no cigarettes, now ~53months tabacco free. Commended and encouraged her to continue this change as its a big one she should be proud of.    Expected Outcomes STG: Continue to be tabacco free. LTG: continue with lifestyle changes to help control cardiac risk factors. Remain tobacco free for the long term.             ITP Comments:  ITP Comments     Row Name 07/03/23 1524 07/24/23 1204 07/31/23 1114 08/16/23 0736 09/13/23 0934   ITP Comments Virtual orientation call completed today. shehas an appointment on Date: not set until able to drive, she will call  for EP eval and gym Orientation.  Documentation of diagnosis can be found in [facility]  Date: 06/14/2023 .    Dottye is a former tobacco user. Intervention for tobacco cessation was provided at the initial medical review. She was asked about readiness to quit and reported she quit right before her surgery, had weaned down to 2 cigarettes a day. . Patient was advised and educated about tobacco cessation using combination therapy, tobacco cessation classes, quit line, and quit smoking apps. Patient demonstrated understanding of this material. Staff will continue to provide encouragement and follow up with the patient throughout the program. Completed and gym orientation. Initial ITP created and sent for review to Dr. Firman Hughes, Medical Director. First full day of exercise!  Patient was oriented to gym and equipment including functions, settings, policies, and procedures.  Patient's individual exercise prescription and treatment plan were reviewed.  All starting workloads were established based on the results of the 6 minute walk test done at initial orientation visit.  The plan for exercise progression was also introduced and  progression will be customized based on patient's performance and goals. 30 Day review completed. Medical Director ITP review done, changes made as directed, and signed approval by Medical Director. New to program. 30 Day review completed. Medical Director ITP review done, changes made as directed, and signed approval by Medical Director.    Row  Name 10/11/23 1342           ITP Comments 30 Day review completed. Medical Director ITP review done, changes made as directed, and signed approval by Medical Director.                Comments:

## 2023-10-11 NOTE — Progress Notes (Signed)
 Daily Session Note  Patient Details  Name: Rhonda [name] MRN: [id number] Date of Birth: [birth year] Referring Provider:   Flowsheet Row Cardiac Rehab from 07/24/2023 in [facility] Cardiac and Pulmonary Rehab  Referring Provider Dr. Grady Lawman, MD       Encounter Date: 10/11/2023  Check In:  Session Check In - 10/11/23 1118       Check-In   Supervising physician immediately available to respond to emergencies See telemetry face sheet for immediately available ER MD    Location [facility]-Cardiac & Pulmonary Rehab    Staff Present Maud Sorenson, RN, BSN, CCRP;Meredith Manson Seitz RN,BSN;Joseph Hood RCP,RRT,BSRT;Maxon [place] BS, Exercise Physiologist;Kelly Sabra Cramp BS, ACSM CEP, Exercise Physiologist    Virtual Visit No    Medication changes reported     No    Fall or balance concerns reported    No    Warm-up and Cool-down Performed on first and last piece of equipment    Resistance Training Performed Yes    VAD Patient? No    PAD/SET Patient? No      Pain Assessment   Currently in Pain? No/denies                Social History   Tobacco Use  Smoking Status Former   Current packs/day: 0.20   Types: Cigarettes  Smokeless Tobacco Never  Tobacco Comments   Quit smoking cigarettes 06/07/2023  was smoking 2 cigarettes a day  several months before surgery    Goals Met:  Independence with exercise equipment Exercise tolerated well No report of concerns or symptoms today  Goals Unmet:  Not Applicable  Comments: Pt able to follow exercise prescription today without complaint.  Will continue to monitor for progression.    Dr. Firman Hughes is Medical Director for [facility] Cardiac Rehabilitation.  Dr. Fuad Aleskerov is Medical Director for [facility] Pulmonary Rehabilitation.

## 2023-10-13 ENCOUNTER — Encounter: Payer: Medicare Other | Admitting: *Deleted

## 2023-10-13 DIAGNOSIS — Z9889 Other specified postprocedural states: Secondary | ICD-10-CM

## 2023-10-13 NOTE — Progress Notes (Signed)
 Daily Session Note  Patient Details  Name: Dessence Osler MRN: [id number] Date of Birth: 1955-12-30 Referring Provider:   Flowsheet Row Cardiac Rehab from 07/24/2023 in [facility] Cardiac and Pulmonary Rehab  Referring Provider Dr. Grady Lawman, MD       Encounter Date: 10/13/2023  Check In:  Session Check In - 10/13/23 1146       Check-In   Supervising physician immediately available to respond to emergencies See telemetry face sheet for immediately available ER MD    Location [facility]-Cardiac & Pulmonary Rehab    Staff Present Maud Sorenson, RN, BSN, CCRP;Joseph Hood RCP,RRT,BSRT;Maxon [place] BS, Exercise Physiologist;Jason Martina Sledge RDN,LDN    Virtual Visit No    Medication changes reported     No    Fall or balance concerns reported    No    Warm-up and Cool-down Performed on first and last piece of equipment    Resistance Training Performed Yes    VAD Patient? No    PAD/SET Patient? No      Pain Assessment   Currently in Pain? No/denies                Social History   Tobacco Use  Smoking Status Former   Current packs/day: 0.20   Types: Cigarettes  Smokeless Tobacco Never  Tobacco Comments   Quit smoking cigarettes 06/07/2023  was smoking 2 cigarettes a day  several months before surgery    Goals Met:  Independence with exercise equipment Exercise tolerated well No report of concerns or symptoms today  Goals Unmet:  Not Applicable  Comments: Pt able to follow exercise prescription today without complaint.  Will continue to monitor for progression.    Dr. Firman Hughes is Medical Director for [facility] Cardiac Rehabilitation.  Dr. Fuad Aleskerov is Medical Director for [facility] Pulmonary Rehabilitation.

## 2023-10-16 ENCOUNTER — Encounter: Payer: Medicare Other | Admitting: *Deleted

## 2023-10-16 VITALS — Ht 64.0 in | Wt 175.6 lb

## 2023-10-16 DIAGNOSIS — Z9889 Other specified postprocedural states: Secondary | ICD-10-CM

## 2023-10-16 NOTE — Patient Instructions (Addendum)
 Discharge Patient Instructions  Patient Details  Name: Rhonda [name] MRN: [id number] Date of Birth: January 15, 1956 Referring Provider:  Jonell Neptune,*   Number of Visits: 1  Reason for Discharge:  Patient reached a stable level of exercise. Patient independent in their exercise. Patient has met program and personal goals.  Smoking History:  Social History   Tobacco Use  Smoking Status Former   Current packs/day: 0.20   Types: Cigarettes  Smokeless Tobacco Never  Tobacco Comments   Quit smoking cigarettes 06/07/2023  was smoking 2 cigarettes a day  several months before surgery    Diagnosis:  S/P MVR (mitral valve repair)  S/P tricuspid valve repair  Initial Exercise Prescription:  Initial Exercise Prescription - 07/24/23 1600       Date of Initial Exercise RX and Referring Provider   Date 07/24/23    Referring Provider Dr. Grady Lawman, MD      Oxygen   Maintain Oxygen Saturation 88% or higher      Treadmill   MPH 2.1    Grade 0    Minutes 15    METs 2.61      Recumbant Bike   Level 2    RPM 50    Watts 15    Minutes 15    METs 2.6      NuStep   Level 2    SPM 80    Minutes 15    METs 2.6      Prescription Details   Frequency (times per week) 3    Duration Progress to 30 minutes of continuous aerobic without signs/symptoms of physical distress      Intensity   THRR 40-80% of Max Heartrate 105-138    Ratings of Perceived Exertion 11-13    Perceived Dyspnea 0-4      Progression   Progression Continue to progress workloads to maintain intensity without signs/symptoms of physical distress.      Resistance Training   Training Prescription Yes    Weight 4 lb    Reps 10-15             Discharge Exercise Prescription (Final Exercise Prescription Changes):  Exercise Prescription Changes - 10/04/23 0800       Response to Exercise   Blood Pressure (Admit) 104/58    Blood Pressure (Exit) 104/62    Heart Rate (Admit) 69  bpm    Heart Rate (Exercise) 130 bpm    Heart Rate (Exit) 74 bpm    Oxygen Saturation (Admit) 93 %    Oxygen Saturation (Exercise) 92 %    Oxygen Saturation (Exit) 92 %    Rating of Perceived Exertion (Exercise) 12    Symptoms none    Duration Continue with 30 min of aerobic exercise without signs/symptoms of physical distress.    Intensity THRR unchanged      Progression   Progression Continue to progress workloads to maintain intensity without signs/symptoms of physical distress.    Average METs 2.75      Resistance Training   Training Prescription Yes    Weight 4 lb    Reps 10-15      Interval Training   Interval Training No      Treadmill   MPH 2.6    Grade 0    Minutes 15    METs 2.99      REL-XR   Level 5    Minutes 15      T5 Nustep   Level 2  Minutes 15    METs 2      Oxygen   Maintain Oxygen Saturation 88% or higher             Functional Capacity:  6 Minute Walk     Row Name 07/24/23 1557 10/16/23 1131       6 Minute Walk   Phase Initial Discharge    Distance 1125 feet 1150 feet    Distance % Change -- 2.22 %    Distance Feet Change -- 25 ft    Walk Time 6 minutes 6 minutes    # of Rest Breaks 0 0    MPH 2.13 2.18    METS 2.6 2.78    RPE 9 9    Perceived Dyspnea  0 0    VO2 Peak 9.09 9.72    Symptoms No No    Resting HR 72 bpm 97 bpm    Resting BP 106/60 116/70    Resting Oxygen Saturation  97 % 94 %    Exercise Oxygen Saturation  during 6 min walk 95 % 92 %    Max Ex. HR 95 bpm 129 bpm    Max Ex. BP 120/64 114/62    2 Minute Post BP 116/62 108/60            Nutrition & Weight - Outcomes:  Pre Biometrics - 07/24/23 1556       Pre Biometrics   Height 5\' 4"  (1.626 m)    Weight 169 lb 9.6 oz (76.9 kg)    Waist Circumference 35 inches    Hip Circumference 37 inches    Waist to Hip Ratio 0.95 %    BMI (Calculated) 29.1    Single Leg Stand 8.4 seconds             Post Biometrics - 10/16/23 1133        Post   Biometrics   Height 5\' 4"  (1.626 m)    Weight 175 lb 9.6 oz (79.7 kg)    Waist Circumference 38 inches    Hip Circumference 43 inches    Waist to Hip Ratio 0.88 %    BMI (Calculated) 30.13    Single Leg Stand 11.3 seconds             Nutrition:  Nutrition Therapy & Goals - 07/24/23 1351       Nutrition Therapy   Diet Cardiac, Low Na    Protein (specify units) 70-90g    Fiber 25 grams    Whole Grain Foods 3 servings    Saturated Fats 15 max. grams    Fruits and Vegetables 5 servings/day    Sodium 2 grams      Personal Nutrition Goals   Nutrition Goal Eat 15-30gProtein and 30-60gCarbs at each meal.    Personal Goal #2 Read labels and reduce sodium intake to below 2300mg . Ideally 1500mg  per day.    Personal Goal #3 Reduce saturated fat, less than 12g per day. Replace bad fats for more heart healthy fats.    Comments Patient drinking 20oz of diet coke, cup of coffee most mornings. She usually eats breakfast, but then wont eat lunch on the days she ate breakfast. She likes salads and often does them for dinner. Reviewed mediterranean diet handout, educated on types of fats, sources, and how to read labels. She reports she reads labels often, was apart of weight watchers for many years. She doesn't salt her foods and tries to keep sodium intake low. Provided  her with goal of less than 1500mg  sodium daily. Encouraged her to eat 3 times per day use small protein snack paired with a complex carb. She likes cottage cheese and peaches.      Intervention Plan   Intervention Prescribe, educate and counsel regarding individualized specific dietary modifications aiming towards targeted core components such as weight, hypertension, lipid management, diabetes, heart failure and other comorbidities.;Nutrition handout(s) given to patient.    Expected Outcomes Short Term Goal: Understand basic principles of dietary content, such as calories, fat, sodium, cholesterol and nutrients.;Short Term Goal: A  plan has been developed with personal nutrition goals set during dietitian appointment.;Long Term Goal: Adherence to prescribed nutrition plan.

## 2023-10-16 NOTE — Progress Notes (Signed)
 Daily Session Note  Patient Details  Name: Rhonda [name] MRN: [id number] Date of Birth: 1955/07/11 Referring Provider:   Flowsheet Row Cardiac Rehab from 07/24/2023 in [facility] Cardiac and Pulmonary Rehab  Referring Provider Dr. Grady Lawman, MD       Encounter Date: 10/16/2023  Check In:  Session Check In - 10/16/23 1159       Check-In   Supervising physician immediately available to respond to emergencies See telemetry face sheet for immediately available ER MD    Location [facility]-Cardiac & Pulmonary Rehab    Staff Present Maud Sorenson, RN, BSN, CCRP;Joseph Hood RCP,RRT,BSRT;Kelly [place] BS, ACSM CEP, Exercise Physiologist;Maxon [employer], Exercise Physiologist;Margaret Best, MS, Exercise Physiologist    Virtual Visit No    Medication changes reported     No    Fall or balance concerns reported    No    Warm-up and Cool-down Performed on first and last piece of equipment    Resistance Training Performed Yes    VAD Patient? No    PAD/SET Patient? No      Pain Assessment   Currently in Pain? No/denies                Social History   Tobacco Use  Smoking Status Former   Current packs/day: 0.20   Types: Cigarettes  Smokeless Tobacco Never  Tobacco Comments   Quit smoking cigarettes 06/07/2023  was smoking 2 cigarettes a day  several months before surgery    Goals Met:  Independence with exercise equipment Exercise tolerated well No report of concerns or symptoms today  Goals Unmet:  Not Applicable  Comments: Pt able to follow exercise prescription today without complaint.  Will continue to monitor for progression.    Dr. Firman Hughes is Medical Director for [facility] Cardiac Rehabilitation.  Dr. Fuad Aleskerov is Medical Director for [facility] Pulmonary Rehabilitation.

## 2023-10-18 ENCOUNTER — Encounter: Payer: Medicare Other | Admitting: *Deleted

## 2023-10-18 DIAGNOSIS — Z9889 Other specified postprocedural states: Secondary | ICD-10-CM | POA: Diagnosis not present

## 2023-10-18 NOTE — Progress Notes (Signed)
 Daily Session Note  Patient Details  Name: Rhonda [name] MRN: [id number] Date of Birth: [birth year] Referring Provider:   Flowsheet Row Cardiac Rehab from 07/24/2023 in [facility] Cardiac and Pulmonary Rehab  Referring Provider Dr. Grady Lawman, MD       Encounter Date: 10/18/2023  Check In:  Session Check In - 10/18/23 1207       Check-In   Supervising physician immediately available to respond to emergencies See telemetry face sheet for immediately available ER MD    Location [facility]-Cardiac & Pulmonary Rehab    Staff Present Maud Sorenson, RN, BSN, CCRP;Joseph Hood RCP,RRT,BSRT;Maxon [place] BS, Exercise Physiologist;Noah Tickle, BS, Exercise Physiologist    Virtual Visit No    Medication changes reported     No    Fall or balance concerns reported    No    Warm-up and Cool-down Performed on first and last piece of equipment    Resistance Training Performed Yes    VAD Patient? No    PAD/SET Patient? No      Pain Assessment   Currently in Pain? No/denies                Social History   Tobacco Use  Smoking Status Former   Current packs/day: 0.20   Types: Cigarettes  Smokeless Tobacco Never  Tobacco Comments   Quit smoking cigarettes 06/07/2023  was smoking 2 cigarettes a day  several months before surgery    Goals Met:  Independence with exercise equipment Exercise tolerated well No report of concerns or symptoms today  Goals Unmet:  Not Applicable  Comments: Pt able to follow exercise prescription today without complaint.  Will continue to monitor for progression.    Dr. Firman Hughes is Medical Director for [facility] Cardiac Rehabilitation.  Dr. Fuad Aleskerov is Medical Director for [facility] Pulmonary Rehabilitation.

## 2023-10-19 ENCOUNTER — Other Ambulatory Visit: Payer: Self-pay | Admitting: Thoracic Surgery (Cardiothoracic Vascular Surgery)

## 2023-10-20 ENCOUNTER — Encounter: Payer: Medicare Other | Admitting: *Deleted

## 2023-10-20 DIAGNOSIS — Z9889 Other specified postprocedural states: Secondary | ICD-10-CM | POA: Diagnosis not present

## 2023-10-20 NOTE — Progress Notes (Signed)
 Daily Session Note  Patient Details  Name: Rhonda [name] MRN: [id number] Date of Birth: 10/10/1955 Referring Provider:   Flowsheet Row Cardiac Rehab from 07/24/2023 in [facility] Cardiac and Pulmonary Rehab  Referring Provider Dr. Grady Lawman, MD       Encounter Date: 10/20/2023  Check In:  Session Check In - 10/20/23 1120       Check-In   Supervising physician immediately available to respond to emergencies See telemetry face sheet for immediately available ER MD    Location [facility]-Cardiac & Pulmonary Rehab    Staff Present Maud Sorenson, RN, BSN, CCRP;Joseph Hood RCP,RRT,BSRT;Maxon [place] BS, Exercise Physiologist;Noah Tickle, BS, Exercise Physiologist    Virtual Visit No    Medication changes reported     No    Fall or balance concerns reported    No    Warm-up and Cool-down Performed on first and last piece of equipment    Resistance Training Performed Yes    VAD Patient? No    PAD/SET Patient? No      Pain Assessment   Currently in Pain? No/denies                Social History   Tobacco Use  Smoking Status Former   Current packs/day: 0.20   Types: Cigarettes  Smokeless Tobacco Never  Tobacco Comments   Quit smoking cigarettes 06/07/2023  was smoking 2 cigarettes a day  several months before surgery    Goals Met:  Independence with exercise equipment Exercise tolerated well No report of concerns or symptoms today  Goals Unmet:  Not Applicable  Comments: Pt able to follow exercise prescription today without complaint.  Will continue to monitor for progression.    Dr. Firman Hughes is Medical Director for [facility] Cardiac Rehabilitation.  Dr. Fuad Aleskerov is Medical Director for [facility] Pulmonary Rehabilitation.

## 2023-10-23 ENCOUNTER — Encounter: Admitting: *Deleted

## 2023-10-23 DIAGNOSIS — Z9889 Other specified postprocedural states: Secondary | ICD-10-CM | POA: Diagnosis not present

## 2023-10-23 NOTE — Progress Notes (Signed)
 Daily Session Note  Patient Details  Name: Rhonda [name] MRN: [id number] Date of Birth: Dec 01, 1955 Referring Provider:   Flowsheet Row Cardiac Rehab from 07/24/2023 in [facility] Cardiac and Pulmonary Rehab  Referring Provider Dr. Grady Lawman, MD       Encounter Date: 10/23/2023  Check In:  Session Check In - 10/23/23 1126       Check-In   Supervising physician immediately available to respond to emergencies See telemetry face sheet for immediately available ER MD    Location [facility]-Cardiac & Pulmonary Rehab    Staff Present Maud Sorenson, RN, BSN, CCRP;Maxon [place] BS, Exercise Physiologist;Kelly [employer], ACSM CEP, Exercise Physiologist    Virtual Visit No    Medication changes reported     No    Fall or balance concerns reported    No    Warm-up and Cool-down Performed on first and last piece of equipment    Resistance Training Performed Yes    VAD Patient? No    PAD/SET Patient? No      Pain Assessment   Currently in Pain? No/denies                Social History   Tobacco Use  Smoking Status Former   Current packs/day: 0.20   Types: Cigarettes  Smokeless Tobacco Never  Tobacco Comments   Quit smoking cigarettes 06/07/2023  was smoking 2 cigarettes a day  several months before surgery    Goals Met:  Independence with exercise equipment Exercise tolerated well No report of concerns or symptoms today  Goals Unmet:  Not Applicable  Comments: Pt able to follow exercise prescription today without complaint.  Will continue to monitor for progression.    Dr. Firman Hughes is Medical Director for [facility] Cardiac Rehabilitation.  Dr. Fuad Aleskerov is Medical Director for [facility] Pulmonary Rehabilitation.

## 2023-10-24 ENCOUNTER — Other Ambulatory Visit: Payer: Self-pay | Admitting: Thoracic Surgery (Cardiothoracic Vascular Surgery)

## 2023-10-25 ENCOUNTER — Encounter: Admitting: *Deleted

## 2023-10-25 DIAGNOSIS — Z9889 Other specified postprocedural states: Secondary | ICD-10-CM

## 2023-10-25 NOTE — Progress Notes (Signed)
 Discharge Note for  [name] Rhonda [name]     [birth year]         Rhonda [name] graduated today from  rehab with 36 sessions completed.  Details of the patient's exercise prescription and what She needs to do in order to continue the prescription and progress were discussed with patient.  Patient was given a copy of prescription and goals.  Patient verbalized understanding. Dionisia plans to continue to exercise by joining a gym and walking at the mall with friends.    6 Minute Walk     Row Name 07/24/23 1557 10/16/23 1131       6 Minute Walk   Phase Initial Discharge    Distance 1125 feet 1150 feet    Distance % Change -- 2.22 %    Distance Feet Change -- 25 ft    Walk Time 6 minutes 6 minutes    # of Rest Breaks 0 0    MPH 2.13 2.18    METS 2.6 2.78    RPE 9 9    Perceived Dyspnea  0 0    VO2 Peak 9.09 9.72    Symptoms No No    Resting HR 72 bpm 97 bpm    Resting BP 106/60 116/70    Resting Oxygen Saturation  97 % 94 %    Exercise Oxygen Saturation  during 6 min walk 95 % 92 %    Max Ex. HR 95 bpm 129 bpm    Max Ex. BP 120/64 114/62    2 Minute Post BP 116/62 108/60

## 2023-10-25 NOTE — Progress Notes (Signed)
 Cardiac Individual Treatment Plan  Patient Details  Name: Rhonda [name] MRN: [id number] Date of Birth: [birth year] Referring Provider:   Flowsheet Row Cardiac Rehab from 07/24/2023 in [facility] Cardiac and Pulmonary Rehab  Referring Provider Dr. Grady Lawman, MD       Initial Encounter Date:  Flowsheet Row Cardiac Rehab from 07/24/2023 in [facility] Cardiac and Pulmonary Rehab  Date 07/24/23       Visit Diagnosis: S/P MVR (mitral valve repair)  S/P tricuspid valve repair  Patient's Home Medications on Admission:  Current Outpatient Medications:    acetaminophen  (TYLENOL ) 500 MG tablet, Take 500 mg by mouth every 6 (six) hours as needed., Disp: , Rfl:    albuterol  (PROVENTIL  HFA;VENTOLIN  HFA) 108 (90 Base) MCG/ACT inhaler, Inhale 2 puffs into the lungs every 6 (six) hours as needed for wheezing or shortness of breath., Disp: 1 Inhaler, Rfl: 2   amoxicillin  (AMOXIL ) 500 MG capsule, Take 4 capsules by mouth one time 30-60 minutes prior to dental appointment, Disp: 4 capsule, Rfl: 0   apixaban  (ELIQUIS ) 5 MG TABS tablet, Take 1 tablet (5 mg total) by mouth 2 (two) times daily., Disp: 60 tablet, Rfl: 1   atorvastatin  (LIPITOR) 20 MG tablet, Take 1 tablet (20 mg total) by mouth every evening., Disp: 90 tablet, Rfl: 3   digoxin  (LANOXIN ) 0.125 MG tablet, Take 0.5 tablets (0.0625 mg total) by mouth daily., Disp: 45 tablet, Rfl: 3   furosemide  (LASIX ) 40 MG tablet, Take 40 mg by mouth daily., Disp: , Rfl:    Homeopathic Products [facility] MENTAL FOCUS PO), Take 2 capsules by mouth in the morning. Amare MentaFocus, Disp: , Rfl:    metoprolol  tartrate (LOPRESSOR ) 25 MG tablet, Take 1 tablet (25 mg total) by mouth 2 (two) times daily., Disp: 180 tablet, Rfl: 2   Multiple Vitamins-Iron  (MULTIVITAMINS WITH IRON ) TABS tablet, Take 1 tablet by mouth daily., Disp: , Rfl:    NON FORMULARY, Take 2 capsules by mouth in the morning. Mood+ by [employer], Disp: , Rfl:    OVER THE COUNTER MEDICATION,  Take 1 packet by mouth daily. Amare Edge+ Grape Plant-Based Nootropics, Disp: , Rfl:    pantoprazole  (PROTONIX ) 40 MG tablet, TAKE 1 TABLET BY MOUTH EVERY DAY, Disp: 90 tablet, Rfl: 3   Probiotic Product (PROBIOTIC PO), Take 1 packet by mouth daily. MentaBiotics Advance Gut Brain Nutrition Prebiotic/Probiotic/Phytobiotics, Disp: , Rfl:    spironolactone  (ALDACTONE ) 100 MG tablet, Take 100 mg by mouth daily., Disp: , Rfl:   Past Medical History: Past Medical History:  Diagnosis Date   Allergy    Atrial fibrillation (HCC)    CAD (coronary artery disease) 08/2009   BMS to RCA, non-obstructive in CX and LAD 2011   Cataract    CHF (congestive heart failure) (HCC)    Cirrhosis (HCC)    Dysrhythmia    A. Fib   Edema, peripheral    Heart murmur    Hypertension    Myocardial infarction [facility])    Nausea    Pneumonia    January 2020   Sleep apnea    No CPAP   Trichomoniasis 06/05/2018    Tobacco Use: Social History   Tobacco Use  Smoking Status Former   Current packs/day: 0.20   Types: Cigarettes  Smokeless Tobacco Never  Tobacco Comments   Quit smoking cigarettes 06/07/2023  was smoking 2 cigarettes a day  several months before surgery    Labs: Review Flowsheet  More data exists  Latest Ref Rng & Units 03/31/2023 06/12/2023 06/14/2023 06/15/2023 06/16/2023  Labs for ITP Cardiac and Pulmonary Rehab  Hemoglobin A1c 4.8 - 5.6 % - 6.2  - - -  PH, Arterial 7.35 - 7.45 7.361  - 7.255  7.233  7.200  7.326  7.414  7.370  7.446  7.301  7.269  7.281  7.259  7.433  7.237  7.281   PCO2 arterial 32 - 48 mmHg 46.4  - 56.1  64.3  65.4  49.2  41.7  47.4  37.9  56.5  54.7  56.2  54.0  34.7  58.4  55.7   Bicarbonate 20.0 - 28.0 mmol/L 26.3  27.8  - 25.1  27.3  25.7  25.9  26.7  27.4  26.4  26.1  27.8  25.1  26.5  24.1  23.1  24.7  26.2   TCO2 22 - 32 mmol/L 28  29  - 27  29  28  27  28  27  29  30  28  27  27  30  29  27  28  26  24  26  28    Acid-base deficit 0.0 - 2.0 mmol/L - - 3.0  1.0  4.0   1.0  2.0  1.0  3.0  1.0  3.0  1.0   O2 Saturation % 89  63  - 94  93  93  97  100  100  80  100  100  93  95  93  94  93  93     Details       Multiple values from one day are sorted in reverse-chronological order          Exercise Target Goals: Exercise Program Goal: Individual exercise prescription set using results from initial 6 min walk test and THRR while considering  patient's activity barriers and safety.   Exercise Prescription Goal: Initial exercise prescription builds to 30-45 minutes a day of aerobic activity, 2-3 days per week.  Home exercise guidelines will be given to patient during program as part of exercise prescription that the participant will acknowledge.   Education: Aerobic Exercise: - Group verbal and visual presentation on the components of exercise prescription. Introduces F.I.T.T principle from ACSM for exercise prescriptions.  Reviews F.I.T.T. principles of aerobic exercise including progression. Written material given at graduation. Flowsheet Row Cardiac Rehab from 07/24/2023 in [facility] Cardiac and Pulmonary Rehab  Education need identified 07/24/23       Education: Resistance Exercise: - Group verbal and visual presentation on the components of exercise prescription. Introduces F.I.T.T principle from ACSM for exercise prescriptions  Reviews F.I.T.T. principles of resistance exercise including progression. Written material given at graduation.    Education: Exercise & Equipment Safety: - Individual verbal instruction and demonstration of equipment use and safety with use of the equipment. Flowsheet Row Cardiac Rehab from 07/24/2023 in [facility] Cardiac and Pulmonary Rehab  Date 07/24/23  Educator NT  Instruction Review Code 1- Verbalizes Understanding       Education: Exercise Physiology & General Exercise Guidelines: - Group verbal and written instruction with models to review the exercise physiology of the cardiovascular system and associated critical  values. Provides general exercise guidelines with specific guidelines to those with heart or lung disease.    Education: Flexibility, Balance, Mind/Body Relaxation: - Group verbal and visual presentation with interactive activity on the components of exercise prescription. Introduces F.I.T.T principle from ACSM for exercise prescriptions. Reviews F.I.T.T. principles of flexibility and balance exercise  training including progression. Also discusses the mind body connection.  Reviews various relaxation techniques to help reduce and manage stress (i.e. Deep breathing, progressive muscle relaxation, and visualization). Balance handout provided to take home. Written material given at graduation.   Activity Barriers & Risk Stratification:  Activity Barriers & Cardiac Risk Stratification - 07/24/23 1556       Activity Barriers & Cardiac Risk Stratification   Activity Barriers Joint Problems   R knee pain   Cardiac Risk Stratification Moderate             6 Minute Walk:  6 Minute Walk     Row Name 07/24/23 1557 10/16/23 1131       6 Minute Walk   Phase Initial Discharge    Distance 1125 feet 1150 feet    Distance % Change -- 2.22 %    Distance Feet Change -- 25 ft    Walk Time 6 minutes 6 minutes    # of Rest Breaks 0 0    MPH 2.13 2.18    METS 2.6 2.78    RPE 9 9    Perceived Dyspnea  0 0    VO2 Peak 9.09 9.72    Symptoms No No    Resting HR 72 bpm 97 bpm    Resting BP 106/60 116/70    Resting Oxygen Saturation  97 % 94 %    Exercise Oxygen Saturation  during 6 min walk 95 % 92 %    Max Ex. HR 95 bpm 129 bpm    Max Ex. BP 120/64 114/62    2 Minute Post BP 116/62 108/60             Oxygen Initial Assessment:  Oxygen Initial Assessment - 07/03/23 1437       Home Oxygen   Home Oxygen Device Home Concentrator;E-Tanks    Sleep Oxygen Prescription Continuous    Liters per minute 3    Home Exercise Oxygen Prescription Continuous    Liters per minute 3    Home Resting  Oxygen Prescription Continuous    Liters per minute 3    Compliance with Home Oxygen Use Yes      Intervention   Short Term Goals To learn and exhibit compliance with exercise, home and travel O2 prescription;To learn and understand importance of monitoring SPO2 with pulse oximeter and demonstrate accurate use of the pulse oximeter.;To learn and understand importance of maintaining oxygen saturations>88%;To learn and demonstrate proper pursed lip breathing techniques or other breathing techniques. ;To learn and demonstrate proper use of respiratory medications    Long  Term Goals Exhibits compliance with exercise, home  and travel O2 prescription;Verbalizes importance of monitoring SPO2 with pulse oximeter and return demonstration;Maintenance of O2 saturations>88%;Exhibits proper breathing techniques, such as pursed lip breathing or other method taught during program session;Compliance with respiratory medication;Demonstrates proper use of MDI's             Oxygen Re-Evaluation:   Oxygen Discharge (Final Oxygen Re-Evaluation):   Initial Exercise Prescription:  Initial Exercise Prescription - 07/24/23 1600       Date of Initial Exercise RX and Referring Provider   Date 07/24/23    Referring Provider Dr. Grady Lawman, MD      Oxygen   Maintain Oxygen Saturation 88% or higher      Treadmill   MPH 2.1    Grade 0    Minutes 15    METs 2.61      Recumbant Bike  Level 2    RPM 50    Watts 15    Minutes 15    METs 2.6      NuStep   Level 2    SPM 80    Minutes 15    METs 2.6      Prescription Details   Frequency (times per week) 3    Duration Progress to 30 minutes of continuous aerobic without signs/symptoms of physical distress      Intensity   THRR 40-80% of Max Heartrate 105-138    Ratings of Perceived Exertion 11-13    Perceived Dyspnea 0-4      Progression   Progression Continue to progress workloads to maintain intensity without signs/symptoms of  physical distress.      Resistance Training   Training Prescription Yes    Weight 4 lb    Reps 10-15             Perform Capillary Blood Glucose checks as needed.  Exercise Prescription Changes:   Exercise Prescription Changes     Row Name 07/24/23 1600 08/08/23 1400 08/22/23 1400 09/06/23 1500 09/21/23 1600     Response to Exercise   Blood Pressure (Admit) 106/60 104/64 122/60 102/58 118/60   Blood Pressure (Exercise) 120/64 144/70 138/72 148/70 --   Blood Pressure (Exit) 116/62 98/60 108/60 102/54 108/54   Heart Rate (Admit) 72 bpm 71 bpm 82 bpm 91 bpm 79 bpm   Heart Rate (Exercise) 95 bpm 140 bpm 98 bpm 118 bpm 109 bpm   Heart Rate (Exit) 81 bpm 71 bpm 82 bpm 68 bpm 78 bpm   Oxygen Saturation (Admit) 97 % -- -- -- 96 %   Oxygen Saturation (Exercise) 95 % -- -- -- 92 %   Oxygen Saturation (Exit) -- -- -- -- 95 %   Rating of Perceived Exertion (Exercise) 9 11 12 12 13    Perceived Dyspnea (Exercise) 0 -- -- -- --   Symptoms none none none none none   Comments Results First three days of exercise -- -- --   Duration -- Continue with 30 min of aerobic exercise without signs/symptoms of physical distress. Continue with 30 min of aerobic exercise without signs/symptoms of physical distress. Continue with 30 min of aerobic exercise without signs/symptoms of physical distress. Continue with 30 min of aerobic exercise without signs/symptoms of physical distress.   Intensity -- THRR unchanged THRR unchanged THRR unchanged THRR unchanged     Progression   Progression -- Continue to progress workloads to maintain intensity without signs/symptoms of physical distress. Continue to progress workloads to maintain intensity without signs/symptoms of physical distress. Continue to progress workloads to maintain intensity without signs/symptoms of physical distress. Continue to progress workloads to maintain intensity without signs/symptoms of physical distress.   Average METs -- 2.52  2.85 2.84 2.61     Resistance Training   Training Prescription -- Yes Yes Yes Yes   Weight -- 4 lb 4 lb 4 lb 4 lb   Reps -- 10-15 10-15 10-15 10-15     Interval Training   Interval Training -- No No No No     Treadmill   MPH -- 2.2 2.5 2.5 2.5   Grade -- 0 0 0 0   Minutes -- 15 15 15 15    METs -- 2.69 2.91 2.91 2.91     NuStep   Level -- 4  T6: level 3 5 5  --   Minutes -- 15 15 15  --   METs --  2.8  T6: 2.3 METs 2.9 2.8 --     REL-XR   Level -- -- 2 5 5    Minutes -- -- 15 15 15    METs -- -- 3.2 -- --     T5 Nustep   Level -- -- -- -- 3   Minutes -- -- -- -- 15   METs -- -- -- -- 2.3     Track   Laps -- -- 35 -- --   Minutes -- -- 15 -- --   METs -- -- 2.9 -- --     Oxygen   Maintain Oxygen Saturation -- 88% or higher 88% or higher 88% or higher 88% or higher    Row Name 10/04/23 0800 10/19/23 0800           Response to Exercise   Blood Pressure (Admit) 104/58 108/56      Blood Pressure (Exit) 104/62 100/56      Heart Rate (Admit) 69 bpm 64 bpm      Heart Rate (Exercise) 130 bpm 103 bpm      Heart Rate (Exit) 74 bpm 66 bpm      Oxygen Saturation (Admit) 93 % 93 %      Oxygen Saturation (Exercise) 92 % 91 %      Oxygen Saturation (Exit) 92 % 92 %      Rating of Perceived Exertion (Exercise) 12 15      Symptoms none none      Duration Continue with 30 min of aerobic exercise without signs/symptoms of physical distress. Continue with 30 min of aerobic exercise without signs/symptoms of physical distress.      Intensity THRR unchanged THRR unchanged        Progression   Progression Continue to progress workloads to maintain intensity without signs/symptoms of physical distress. Continue to progress workloads to maintain intensity without signs/symptoms of physical distress.      Average METs 2.75 3.31        Resistance Training   Training Prescription Yes Yes      Weight 4 lb 5 lb      Reps 10-15 10-15        Interval Training   Interval Training No No         Treadmill   MPH 2.6 2.5      Grade 0 0.5      Minutes 15 15      METs 2.99 3.09        Elliptical   Level -- 1      Speed -- 2.3      Minutes -- 15      METs -- 3        REL-XR   Level 5 1      Minutes 15 15      METs -- 4.6        T5 Nustep   Level 2 --      Minutes 15 --      METs 2 --        Rower   Level -- 3      Watts -- 10      Minutes -- 15      METs -- 4        Track   Laps -- 35      Minutes -- 15      METs -- 2.9        Oxygen   Maintain Oxygen Saturation 88% or higher 88% or  higher               Exercise Comments:   Exercise Comments     Row Name 07/31/23 1115 10/25/23 1134         Exercise Comments First full day of exercise!  Patient was oriented to gym and equipment including functions, settings, policies, and procedures.  Patient's individual exercise prescription and treatment plan were reviewed.  All starting workloads were established based on the results of the 6 minute walk test done at initial orientation visit.  The plan for exercise progression was also introduced and progression will be customized based on patient's performance and goals. Shawneen graduated today from  rehab with 36 sessions completed.  Details of the patient's exercise prescription and what She needs to do in order to continue the prescription and progress were discussed with patient.  Patient was given a copy of prescription and goals.  Patient verbalized understanding. Gagandeep plans to continue to exercise by joining a gym and walking at the mall with friends.               Exercise Goals and Review:   Exercise Goals     Row Name 07/24/23 1208             Exercise Goals   Increase Physical Activity Yes       Intervention Develop an individualized exercise prescription for aerobic and resistive training based on initial evaluation findings, risk stratification, comorbidities and participant's personal goals.;Provide advice, education, support and  counseling about physical activity/exercise needs.       Expected Outcomes Long Term: Exercising regularly at least 3-5 days a week.;Long Term: Add in home exercise to make exercise part of routine and to increase amount of physical activity.;Short Term: Attend rehab on a regular basis to increase amount of physical activity.       Increase Strength and Stamina Yes       Intervention Provide advice, education, support and counseling about physical activity/exercise needs.;Develop an individualized exercise prescription for aerobic and resistive training based on initial evaluation findings, risk stratification, comorbidities and participant's personal goals.       Expected Outcomes Short Term: Increase workloads from initial exercise prescription for resistance, speed, and METs.;Short Term: Perform resistance training exercises routinely during rehab and add in resistance training at home;Long Term: Improve cardiorespiratory fitness, muscular endurance and strength as measured by increased METs and functional capacity ( )       Able to understand and use rate of perceived exertion (RPE) scale Yes       Intervention Provide education and explanation on how to use RPE scale       Expected Outcomes Short Term: Able to use RPE daily in rehab to express subjective intensity level;Long Term:  Able to use RPE to guide intensity level when exercising independently       Able to understand and use Dyspnea scale Yes       Intervention Provide education and explanation on how to use Dyspnea scale       Expected Outcomes Short Term: Able to use Dyspnea scale daily in rehab to express subjective sense of shortness of breath during exertion;Long Term: Able to use Dyspnea scale to guide intensity level when exercising independently       Knowledge and understanding of Target Heart Rate Range (THRR) Yes       Intervention Provide education and explanation of THRR including how the numbers were predicted and where  they are located  for reference       Expected Outcomes Short Term: Able to state/look up THRR;Long Term: Able to use THRR to govern intensity when exercising independently;Short Term: Able to use daily as guideline for intensity in rehab       Able to check pulse independently Yes       Intervention Provide education and demonstration on how to check pulse in carotid and radial arteries.;Review the importance of being able to check your own pulse for safety during independent exercise       Expected Outcomes Short Term: Able to explain why pulse checking is important during independent exercise;Long Term: Able to check pulse independently and accurately       Understanding of Exercise Prescription Yes       Intervention Provide education, explanation, and written materials on patient's individual exercise prescription       Expected Outcomes Short Term: Able to explain program exercise prescription;Long Term: Able to explain home exercise prescription to exercise independently                Exercise Goals Re-Evaluation :  Exercise Goals Re-Evaluation     Row Name 07/31/23 1115 08/08/23 1435 08/22/23 1405 09/06/23 1542 09/21/23 1640     Exercise Goal Re-Evaluation   Exercise Goals Review Able to understand and use rate of perceived exertion (RPE) scale;Able to understand and use Dyspnea scale;Knowledge and understanding of Target Heart Rate Range (THRR);Understanding of Exercise Prescription Understanding of Exercise Prescription;Increase Physical Activity;Increase Strength and Stamina Understanding of Exercise Prescription;Increase Physical Activity;Increase Strength and Stamina Understanding of Exercise Prescription;Increase Physical Activity;Increase Strength and Stamina Understanding of Exercise Prescription;Increase Physical Activity;Increase Strength and Stamina   Comments Reviewed RPE and dyspnea scale, THR and program prescription with pt today.  Pt voiced understanding and was given a  copy of goals to take home. [name] is off to a good start in the program. She was able to walk the treadmill at a speed of 2.2 mph with no incline during her first week of rehab. She also did well working at level 3 on the T6 nustep and level 4 on the T4 nustep. We will continue to monitor her progress in the program. [name] is doing well in rehab. She recently increased her treadmill speed to 2.5 mph with no incline. She also improved to level 5 on the T4 nustep and began using the XR at level 2. We will continue to monitor her progress in the program. [name] continues to do well in the program. She has continued to walk the treadmill at a speed of 2.5 mph with no incline. She also improved to level 5 on the XR and has stayed consistent at level 5 on the T4 nustep. We will continue to monitor her progress in the program. [name] is doing well in rehab. She was able to maintain her workload on the treadmill at a speed of 2. and no incline. She was also able to use the T5 nustep at level 3. We will continue to monitor her progress in the program.   Expected Outcomes Short: Use RPE daily to regulate intensity. Long: Follow program prescription in THR. Short: Continue to follow current exercise prescription. Long: Continue exercise to improve strength and stamina. Short: Add incline to treadmill workload. Long: Continue exercise to improve strength and stamina. Short: Add incline to treadmill workload. Long: Continue exercise to improve strength and stamina. Short: Add incline to treadmill workload. Long: Continue exercise to improve strength and stamina.  Row Name 10/02/23 1133 10/04/23 0811 10/19/23 0818         Exercise Goal Re-Evaluation   Exercise Goals Review Increase Physical Activity;Increase Strength and Stamina;Understanding of Exercise Prescription Increase Physical Activity;Increase Strength and Stamina;Understanding of Exercise Prescription Increase Physical Activity;Increase Strength and  Stamina;Understanding of Exercise Prescription     Comments Oralee Billow is doing well in rehab, she says she is walking some at home but would like to exercise more on her own. Encouraged her to look into a gym to be able to do exercises she is doing here and have a good long term plan once she graduates. [name] continues to do well in rehab and will be due for her post 6-minute walk test soon. She increased her workload on the treadmill to a speed of 2.6 mph and no incline. She maintained level 5 on the XR and decreased to level 2 on the T5 nustep from level 3. We will continue to monitor her progress in the program. Oralee Billow is doing well in rehab and is close to graduating from the program. She recently completed her post and improved by 25 ft! She also did well beginning to use the elliptical at level 1 and the rower at level 3. We will continue to monitor her progress in the program.     Expected Outcomes STG: increase workload as able. LTG: Contineu exercise ti improve strength and stamina Short: Try level 6 on the XR and improve on post . Long: Continue to increase overall METs and stamina. Short: Graduate. Long: Continue to exercise independently.              Discharge Exercise Prescription (Final Exercise Prescription Changes):  Exercise Prescription Changes - 10/19/23 0800       Response to Exercise   Blood Pressure (Admit) 108/56    Blood Pressure (Exit) 100/56    Heart Rate (Admit) 64 bpm    Heart Rate (Exercise) 103 bpm    Heart Rate (Exit) 66 bpm    Oxygen Saturation (Admit) 93 %    Oxygen Saturation (Exercise) 91 %    Oxygen Saturation (Exit) 92 %    Rating of Perceived Exertion (Exercise) 15    Symptoms none    Duration Continue with 30 min of aerobic exercise without signs/symptoms of physical distress.    Intensity THRR unchanged      Progression   Progression Continue to progress workloads to maintain intensity without signs/symptoms of physical distress.    Average METs  3.31      Resistance Training   Training Prescription Yes    Weight 5 lb    Reps 10-15      Interval Training   Interval Training No      Treadmill   MPH 2.5    Grade 0.5    Minutes 15    METs 3.09      Elliptical   Level 1    Speed 2.3    Minutes 15    METs 3      REL-XR   Level 1    Minutes 15    METs 4.6      Rower   Level 3    Watts 10    Minutes 15    METs 4      Track   Laps 35    Minutes 15    METs 2.9      Oxygen   Maintain Oxygen Saturation 88% or higher  Nutrition:  Target Goals: Understanding of nutrition guidelines, daily intake of sodium 1500mg , cholesterol 200mg , calories 30% from fat and 7% or less from saturated fats, daily to have 5 or more servings of fruits and vegetables.  Education: All About Nutrition: -Group instruction provided by verbal, written material, interactive activities, discussions, models, and posters to present general guidelines for heart healthy nutrition including fat, fiber, MyPlate, the role of sodium in heart healthy nutrition, utilization of the nutrition label, and utilization of this knowledge for meal planning. Follow up email sent as well. Written material given at graduation. Flowsheet Row Cardiac Rehab from 07/24/2023 in [facility]iac and [facility]  Education need identified 07/24/23       Biometrics:  Pre Biometrics - 07/24/23 1556       Pre Biometrics   Height 5\' 4"  (1.626 m)    Weight 169 lb 9.6 oz (76.9 kg)    Waist Circumference 35 inches    Hip Circumference 37 inches    Waist to Hip Ratio 0.95 %    BMI (Calculated) 29.1    Single Leg Stand 8.4 seconds             Post Biometrics - 10/16/23 1133        Post  Biometrics   Height 5\' 4"  (1.626 m)    Weight 175 lb 9.6 oz (79.7 kg)    Waist Circumference 38 inches    Hip Circumference 43 inches    Waist to Hip Ratio 0.88 %    BMI (Calculated) 30.13    Single Leg Stand 11.3 seconds             Nutrition  Therapy Plan and Nutrition Goals:  Nutrition Therapy & Goals - 07/24/23 1351       Nutrition Therapy   Diet Cardiac, Low Na    Protein (specify units) 70-90g    Fiber 25 grams    Whole Grain Foods 3 servings    Saturated Fats 15 max. grams    Fruits and Vegetables 5 servings/day    Sodium 2 grams      Personal Nutrition Goals   Nutrition Goal Eat 15-30gProtein and 30-60gCarbs at each meal.    Personal Goal #2 Read labels and reduce sodium intake to below 2300mg . Ideally 1500mg  per day.    Personal Goal #3 Reduce saturated fat, less than 12g per day. Replace bad fats for more heart healthy fats.    Comments Patient drinking 20oz of diet coke, cup of coffee most mornings. She usually eats breakfast, but then wont eat lunch on the days she ate breakfast. She likes salads and often does them for dinner. Reviewed mediterranean diet handout, educated on types of fats, sources, and how to read labels. She reports she reads labels often, was apart of weight watchers for many years. She doesn't salt her foods and tries to keep sodium intake low. Provided her with goal of less than 1500mg  sodium daily. Encouraged her to eat 3 times per day use small protein snack paired with a complex carb. She likes cottage cheese and peaches.      Intervention Plan   Intervention Prescribe, educate and counsel regarding individualized specific dietary modifications aiming towards targeted core components such as weight, hypertension, lipid management, diabetes, heart failure and other comorbidities.;Nutrition handout(s) given to patient.    Expected Outcomes Short Term Goal: Understand basic principles of dietary content, such as calories, fat, sodium, cholesterol and nutrients.;Short Term Goal: A plan has been developed with personal  nutrition goals set during dietitian appointment.;Long Term Goal: Adherence to prescribed nutrition plan.             Nutrition Assessments:  MEDIFICTS Score Key: >=70 Need to  make dietary changes  40-70 Heart Healthy Diet <= 40 Therapeutic Level Cholesterol Diet  Flowsheet Row Cardiac Rehab from 07/24/2023 in [facility] Cardiac and Pulmonary Rehab  Picture Your Plate Total Score on Admission 71      Picture Your Plate Scores: <16 Unhealthy dietary pattern with much room for improvement. 41-50 Dietary pattern unlikely to meet recommendations for good health and room for improvement. 51-60 More healthful dietary pattern, with some room for improvement.  >60 Healthy dietary pattern, although there may be some specific behaviors that could be improved.    Nutrition Goals Re-Evaluation:  Nutrition Goals Re-Evaluation     Row Name 09/04/23 1143 10/02/23 1137           Goals   Nutrition Goal Eat 15-30gProtein and 30-60gCarbs at each meal. --      Comment [name] reports that she is eating more protein, mostly chicken, yoghurt, oatmeal with protein. She reads food lables and is making mindful choices to reduce sodium and saturated fat. She eats mostly fresh or frozen vegetables and if she gets canned she gets reduced sodium choices. [name] reports she is doing well with nutrition. When asked what changes she felt she was most proud of, she says eating less carbs and more protien. States this has improved her energy level as well.      Expected Outcome Short: continue to read food labels and eat fresh fruits and veggies. Long: maintain heart healhty diet. STG: Continue to focus on balanced meals. LTG: maintain a heart healthy diet        Personal Goal #2 Re-Evaluation   Personal Goal #2 Read labels and reduce sodium intake to below 2300mg . Ideally 1500mg  per day. --        Personal Goal #3 Re-Evaluation   Personal Goal #3 Reduce saturated fat, less than 12g per day. Replace bad fats for more heart healthy fats. --               Nutrition Goals Discharge (Final Nutrition Goals Re-Evaluation):  Nutrition Goals Re-Evaluation - 10/02/23 1137       Goals   Comment [name]  reports she is doing well with nutrition. When asked what changes she felt she was most proud of, she says eating less carbs and more protien. States this has improved her energy level as well.    Expected Outcome STG: Continue to focus on balanced meals. LTG: maintain a heart healthy diet             Psychosocial: Target Goals: Acknowledge presence or absence of significant depression and/or stress, maximize coping skills, provide positive support system. Participant is able to verbalize types and ability to use techniques and skills needed for reducing stress and depression.   Education: Stress, Anxiety, and Depression - Group verbal and visual presentation to define topics covered.  Reviews how body is impacted by stress, anxiety, and depression.  Also discusses healthy ways to reduce stress and to treat/manage anxiety and depression.  Written material given at graduation.   Education: Sleep Hygiene -Provides group verbal and written instruction about how sleep can affect your health.  Define sleep hygiene, discuss sleep cycles and impact of sleep habits. Review good sleep hygiene tips.    Initial Review & Psychosocial Screening:  Initial Psych Review & Screening -  07/03/23 1428       Initial Review   Current issues with None Identified      Family Dynamics   Good Support System? Yes   daughter, neighbor, church family     Barriers   Psychosocial barriers to participate in program There are no identifiable barriers or psychosocial needs.      Screening Interventions   Interventions To provide support and resources with identified psychosocial needs;Provide feedback about the scores to participant;Encouraged to exercise    Expected Outcomes Short Term goal: Utilizing psychosocial counselor, staff and physician to assist with identification of specific Stressors or current issues interfering with healing process. Setting desired goal for each stressor or current issue  identified.;Long Term Goal: Stressors or current issues are controlled or eliminated.;Short Term goal: Identification and review with participant of any Quality of Life or Depression concerns found by scoring the questionnaire.;Long Term goal: The participant improves quality of Life and PHQ9 Scores as seen by post scores and/or verbalization of changes             Quality of Life Scores:   Quality of Life - 07/24/23 1206       Quality of Life   Select Quality of Life      Quality of Life Scores   Health/Function Pre 25.43 %    Socioeconomic Pre 23.31 %    Psych/Spiritual Pre 29.64 %    Family Pre 25.3 %    GLOBAL Pre 25.77 %            Scores of 19 and below usually indicate a poorer quality of life in these areas.  A difference of  2-3 points is a clinically meaningful difference.  A difference of 2-3 points in the total score of the Quality of Life Index has been associated with significant improvement in overall quality of life, self-image, physical symptoms, and general health in studies assessing change in quality of life.  PHQ-9: Review Flowsheet       07/24/2023  Depression screen PHQ 2/9  Decreased Interest 0  Down, Depressed, Hopeless 0  PHQ - 2 Score 0  Altered sleeping 0  Tired, decreased energy 0  Change in appetite 0  Feeling bad or failure about yourself  0  Trouble concentrating 0  Moving slowly or fidgety/restless 0  Suicidal thoughts 0  PHQ-9 Score 0   Interpretation of Total Score  Total Score Depression Severity:  1-4 = Minimal depression, 5-9 = Mild depression, 10-14 = Moderate depression, 15-19 = Moderately severe depression, 20-27 = Severe depression   Psychosocial Evaluation and Intervention:  Psychosocial Evaluation - 07/03/23 1439       Psychosocial Evaluation & Interventions   Interventions Encouraged to exercise with the program and follow exercise prescription    Comments There are no barriers to her attending the program. She lives  with a roommate in her condo. She has support from her daughter, neighbors and her church family.  She is ready to start the prgoram and work on building strength and stamina and continue healing from her surgery. She is wearing oxygen, was told it is short term, and she is ready to get rid of it as soon as her physician says it time .  She quit smoking right before her surgery, was down to 2 cigarettes a day.    Expected Outcomes STG attend all scheduled sessions, follow the exercise progression. use her tobacco cessation resources to stay quit. LTG continue her exercise progression, stay quit  with tobacco, use her [place] Quitline resource and MD if needed for help    Continue Psychosocial Services  Follow up required by staff             Psychosocial Re-Evaluation:  Psychosocial Re-Evaluation     Row Name 09/04/23 1148 10/02/23 1135           Psychosocial Re-Evaluation   Current issues with None Identified --      Comments [name] reports no concerns with sleep, stress, or mental health. She still has a good support system. She feels well rested when she gets up in the morning and has energy thoughout the day. [name] reports she has a good support system, denies any stress, anxiety or depression. Sleeping well, ~8hrs per night, wakes well rested.      Expected Outcomes Short: continue to attend cardiac rehab for exercise and its mental health benefits. Long: maintain good mental health habits. STG: Continue to exercise at rehab and sleep well. LTG: maintain good mental health habits.      Interventions Encouraged to attend Cardiac Rehabilitation for the exercise Encouraged to attend Cardiac Rehabilitation for the exercise      Continue Psychosocial Services  Follow up required by staff Follow up required by staff               Psychosocial Discharge (Final Psychosocial Re-Evaluation):  Psychosocial Re-Evaluation - 10/02/23 1135       Psychosocial Re-Evaluation   Comments [name] reports she  has a good support system, denies any stress, anxiety or depression. Sleeping well, ~8hrs per night, wakes well rested.    Expected Outcomes STG: Continue to exercise at rehab and sleep well. LTG: maintain good mental health habits.    Interventions Encouraged to attend Cardiac Rehabilitation for the exercise    Continue Psychosocial Services  Follow up required by staff             Vocational Rehabilitation: Provide vocational rehab assistance to qualifying candidates.   Vocational Rehab Evaluation & Intervention:  Vocational Rehab - 07/03/23 1432       Initial Vocational Rehab Evaluation & Intervention   Assessment shows need for Vocational Rehabilitation No      Vocational Rehab Re-Evaulation   Comments no need for VR             Education: Education Goals: Education classes will be provided on a variety of topics geared toward better understanding of heart health and risk factor modification. Participant will state understanding/return demonstration of topics presented as noted by education test scores.  Learning Barriers/Preferences:   General Cardiac Education Topics:  AED/CPR: - Group verbal and written instruction with the use of models to demonstrate the basic use of the AED with the basic ABC's of resuscitation.   Anatomy and Cardiac Procedures: - Group verbal and visual presentation and models provide information about basic cardiac anatomy and function. Reviews the testing methods done to diagnose heart disease and the outcomes of the test results. Describes the treatment choices: Medical Management, Angioplasty, or Coronary Bypass Surgery for treating various heart conditions including Myocardial Infarction, Angina, Valve Disease, and Cardiac Arrhythmias.  Written material given at graduation. Flowsheet Row Cardiac Rehab from 07/24/2023 in [facility] Cardiac and Pulmonary Rehab  Education need identified 07/24/23       Medication Safety: - Group verbal and  visual instruction to review commonly prescribed medications for heart and lung disease. Reviews the medication, class of the drug, and side effects. Includes the steps to  properly store meds and maintain the prescription regimen.  Written material given at graduation.   Intimacy: - Group verbal instruction through game format to discuss how heart and lung disease can affect sexual intimacy. Written material given at graduation..   Know Your Numbers and Heart Failure: - Group verbal and visual instruction to discuss disease risk factors for cardiac and pulmonary disease and treatment options.  Reviews associated critical values for Overweight/Obesity, Hypertension, Cholesterol, and Diabetes.  Discusses basics of heart failure: signs/symptoms and treatments.  Introduces Heart Failure Zone chart for action plan for heart failure.  Written material given at graduation.   Infection Prevention: - Provides verbal and written material to individual with discussion of infection control including proper hand washing and proper equipment cleaning during exercise session. Flowsheet Row Cardiac Rehab from 07/24/2023 in [facility] Cardiac and Pulmonary Rehab  Date 07/24/23  Educator NT  Instruction Review Code 1- Verbalizes Understanding       Falls Prevention: - Provides verbal and written material to individual with discussion of falls prevention and safety. Flowsheet Row Cardiac Rehab from 07/24/2023 in [facility] Cardiac and Pulmonary Rehab  Date 07/03/23  Educator SB  Instruction Review Code 1- Verbalizes Understanding       Other: -Provides group and verbal instruction on various topics (see comments)   Knowledge Questionnaire Score:  Knowledge Questionnaire Score - 07/24/23 1206       Knowledge Questionnaire Score   Pre Score 22/26             Core Components/Risk Factors/Patient Goals at Admission:  Personal Goals and Risk Factors at Admission - 07/03/23 1523       Core  Components/Risk Factors/Patient Goals on Admission   Number of packs per day Denetria is a former tobacco user. Intervention for tobacco cessation was provided at the initial medical review. She was asked about readiness to quit and reported she quit right before her surgery, had weaned down to 2 cigarettes a day. . Patient was advised and educated about tobacco cessation using combination therapy, tobacco cessation classes, quit line, and quit smoking apps. Patient demonstrated understanding of this material. Staff will continue to provide encouragement and follow up with the patient throughout the program.             Education:Diabetes - Individual verbal and written instruction to review signs/symptoms of diabetes, desired ranges of glucose level fasting, after meals and with exercise. Acknowledge that pre and post exercise glucose checks will be done for 3 sessions at entry of program.   Core Components/Risk Factors/Patient Goals Review:   Goals and Risk Factor Review     Row Name 09/04/23 1138 10/02/23 1138           Core Components/Risk Factors/Patient Goals Review   Personal Goals Review Tobacco Cessation;Heart Failure;Lipids Tobacco Cessation      Review [name] reports that she is still tobacco free, and going on 3 months with no cigarettes. She monitors her weight regularly and monitors for signs of heart failure. She takes all her cholesterol meds regularly follows up with doctor for lab work. [name] reports she is doing well with no cigarettes, now ~2months tabacco free. Commended and encouraged her to continue this change as its a big one she should be proud of.      Expected Outcomes short: continue to monitor weight and remain tobacco free. Long: continue with lifestyle changes to help control cardiac risk factors. Remain tobacco free for the long term. STG: Continue to be tabacco  free. LTG: continue with lifestyle changes to help control cardiac risk factors. Remain tobacco free for  the long term.               Core Components/Risk Factors/Patient Goals at Discharge (Final Review):   Goals and Risk Factor Review - 10/02/23 1138       Core Components/Risk Factors/Patient Goals Review   Personal Goals Review Tobacco Cessation    Review [name] reports she is doing well with no cigarettes, now ~55months tabacco free. Commended and encouraged her to continue this change as its a big one she should be proud of.    Expected Outcomes STG: Continue to be tabacco free. LTG: continue with lifestyle changes to help control cardiac risk factors. Remain tobacco free for the long term.             ITP Comments:  ITP Comments     Row Name 07/03/23 1524 07/24/23 1204 07/31/23 1114 08/16/23 0736 09/13/23 0934   ITP Comments Virtual orientation call completed today. shehas an appointment on Date: not set until able to drive, she will call  for EP eval and gym Orientation.  Documentation of diagnosis [facility] be found in [facility]  Date: 06/14/2023 .    Latonyia is a former tobacco user. Intervention for tobacco cessation was provided at the initial medical review. She was asked about readiness to quit and reported she quit right before her surgery, had weaned down to 2 cigarettes a day. . Patient was advised and educated about tobacco cessation using combination therapy, tobacco cessation classes, quit line, and quit smoking apps. Patient demonstrated understanding of this material. Staff will continue to provide encouragement and follow up with the patient throughout the program. Completed and gym orientation. Initial ITP created and sent for review to Dr. Firman Hughes, Medical Director. First full day of exercise!  Patient was oriented to gym and equipment including functions, settings, policies, and procedures.  Patient's individual exercise prescription and treatment plan were reviewed.  All starting workloads were established based on the results of the 6 minute walk test done at initial  orientation visit.  The plan for exercise progression was also introduced and progression will be customized based on patient's performance and goals. 30 Day review completed. Medical Director ITP review done, changes made as directed, and signed approval by Medical Director. New to program. 30 Day review completed. Medical Director ITP review done, changes made as directed, and signed approval by Medical Director.    Row Name 10/11/23 1342 10/25/23 1134         ITP Comments 30 Day review completed. Medical Director ITP review done, changes made as directed, and signed approval by Medical Director. Amarianna graduated today from  rehab with 36 sessions completed.  Details of the patient's exercise prescription and what She needs to do in order to continue the prescription and progress were discussed with patient.  Patient was given a copy of prescription and goals.  Patient verbalized understanding. Laquandra plans to continue to exercise by joining a gym and walking at the mall with friends.               Comments: Discharge ITP

## 2023-10-25 NOTE — Progress Notes (Signed)
 Daily Session Note  Patient Details  Name: Rhonda [name] MRN: [id number] Date of Birth: January 22, 1956 Referring Provider:   Flowsheet Row Cardiac Rehab from 07/24/2023 in [facility] Cardiac and Pulmonary Rehab  Referring Provider Dr. Grady Lawman, MD       Encounter Date: 10/25/2023  Check In:  Session Check In - 10/25/23 1129       Check-In   Supervising physician immediately available to respond to emergencies See telemetry face sheet for immediately available ER MD    Location [facility]-Cardiac & Pulmonary Rehab    Staff Present Maud Sorenson, RN, BSN, CCRP;Maxon Conetta BS, Exercise Physiologist;Noah Tickle, BS, Exercise Physiologist;Jason Martina Sledge RDN,LDN    Virtual Visit No    Medication changes reported     No    Fall or balance concerns reported    No    Warm-up and Cool-down Performed on first and last piece of equipment    Resistance Training Performed Yes    VAD Patient? No    PAD/SET Patient? No      Pain Assessment   Currently in Pain? No/denies                Social History   Tobacco Use  Smoking Status Former   Current packs/day: 0.20   Types: Cigarettes  Smokeless Tobacco Never  Tobacco Comments   Quit smoking cigarettes 06/07/2023  was smoking 2 cigarettes a day  several months before surgery    Goals Met:  Independence with exercise equipment Exercise tolerated well No report of concerns or symptoms today  Goals Unmet:  Not Applicable  Comments: Pt able to follow exercise prescription today without complaint.    Rhonda [name] graduated today from  rehab with 36 sessions completed.  Details of the patient's exercise prescription and what She needs to do in order to continue the prescription and progress were discussed with patient.  Patient was given a copy of prescription and goals.  Patient verbalized understanding. Rhonda [name] plans to continue to exercise by joining a gym and walking at the mall with friends.    Dr. Firman Hughes is Medical Director for  [facility] Cardiac Rehabilitation.  Dr. Fuad Aleskerov is Medical Director for [facility] Pulmonary Rehabilitation.

## 2023-10-26 NOTE — Progress Notes (Signed)
 Cardiac Individual Treatment Plan  Patient Details  Name: Rhonda [name] MRN: [id number] Date of Birth: 06/30/1955 Referring Provider:   Flowsheet Row Cardiac Rehab from 07/24/2023 in [facility] Cardiac and Pulmonary Rehab  Referring Provider Dr. Grady Lawman, MD       Initial Encounter Date:  Flowsheet Row Cardiac Rehab from 07/24/2023 in [facility] Cardiac and Pulmonary Rehab  Date 07/24/23       Visit Diagnosis: S/P TVR (tricuspid valve repair)  S/P MVR (mitral valve repair)  Patient's Home Medications on Admission:  Current Outpatient Medications:    acetaminophen  (TYLENOL ) 500 MG tablet, Take 500 mg by mouth every 6 (six) hours as needed., Disp: , Rfl:    albuterol  (PROVENTIL  HFA;VENTOLIN  HFA) 108 (90 Base) MCG/ACT inhaler, Inhale 2 puffs into the lungs every 6 (six) hours as needed for wheezing or shortness of breath., Disp: 1 Inhaler, Rfl: 2   amoxicillin  (AMOXIL ) 500 MG capsule, Take 4 capsules by mouth one time 30-60 minutes prior to dental appointment, Disp: 4 capsule, Rfl: 0   apixaban  (ELIQUIS ) 5 MG TABS tablet, Take 1 tablet (5 mg total) by mouth 2 (two) times daily., Disp: 60 tablet, Rfl: 1   atorvastatin  (LIPITOR) 20 MG tablet, Take 1 tablet (20 mg total) by mouth every evening., Disp: 90 tablet, Rfl: 3   digoxin  (LANOXIN ) 0.125 MG tablet, Take 0.5 tablets (0.0625 mg total) by mouth daily., Disp: 45 tablet, Rfl: 3   furosemide  (LASIX ) 40 MG tablet, Take 40 mg by mouth daily., Disp: , Rfl:    Homeopathic Products [facility] MENTAL FOCUS PO), Take 2 capsules by mouth in the morning. Amare MentaFocus, Disp: , Rfl:    metoprolol  tartrate (LOPRESSOR ) 25 MG tablet, Take 1 tablet (25 mg total) by mouth 2 (two) times daily., Disp: 180 tablet, Rfl: 2   Multiple Vitamins-Iron  (MULTIVITAMINS WITH IRON ) TABS tablet, Take 1 tablet by mouth daily., Disp: , Rfl:    NON FORMULARY, Take 2 capsules by mouth in the morning. Mood+ by [employer], Disp: , Rfl:    OVER THE COUNTER  MEDICATION, Take 1 packet by mouth daily. Amare Edge+ Grape Plant-Based Nootropics, Disp: , Rfl:    pantoprazole  (PROTONIX ) 40 MG tablet, TAKE 1 TABLET BY MOUTH EVERY DAY, Disp: 90 tablet, Rfl: 3   Probiotic Product (PROBIOTIC PO), Take 1 packet by mouth daily. MentaBiotics Advance Gut Brain Nutrition Prebiotic/Probiotic/Phytobiotics, Disp: , Rfl:    spironolactone  (ALDACTONE ) 100 MG tablet, Take 100 mg by mouth daily., Disp: , Rfl:   Past Medical History: Past Medical History:  Diagnosis Date   Allergy    Atrial fibrillation (HCC)    CAD (coronary artery disease) 08/2009   BMS to RCA, non-obstructive in CX and LAD 2011   Cataract    CHF (congestive heart failure) (HCC)    Cirrhosis (HCC)    Dysrhythmia    A. Fib   Edema, peripheral    Heart murmur    Hypertension    Myocardial infarction [facility])    Nausea    Pneumonia    January 2020   Sleep apnea    No CPAP   Trichomoniasis 06/05/2018    Tobacco Use: Social History   Tobacco Use  Smoking Status Former   Current packs/day: 0.20   Types: Cigarettes  Smokeless Tobacco Never  Tobacco Comments   Quit smoking cigarettes 06/07/2023  was smoking 2 cigarettes a day  several months before surgery    Labs: Review Flowsheet  More data exists  Latest Ref Rng & Units 03/31/2023 06/12/2023 06/14/2023 06/15/2023 06/16/2023  Labs for ITP Cardiac and Pulmonary Rehab  Hemoglobin A1c 4.8 - 5.6 % - 6.2  - - -  PH, Arterial 7.35 - 7.45 7.361  - 7.255  7.233  7.200  7.326  7.414  7.370  7.446  7.301  7.269  7.281  7.259  7.433  7.237  7.281   PCO2 arterial 32 - 48 mmHg 46.4  - 56.1  64.3  65.4  49.2  41.7  47.4  37.9  56.5  54.7  56.2  54.0  34.7  58.4  55.7   Bicarbonate 20.0 - 28.0 mmol/L 26.3  27.8  - 25.1  27.3  25.7  25.9  26.7  27.4  26.4  26.1  27.8  25.1  26.5  24.1  23.1  24.7  26.2   TCO2 22 - 32 mmol/L 28  29  - 27  29  28  27  28  27  29  30  28  27  27  30  29  27  28  26  24  26  28    Acid-base deficit 0.0 - 2.0 mmol/L - - 3.0   1.0  4.0  1.0  2.0  1.0  3.0  1.0  3.0  1.0   O2 Saturation % 89  63  - 94  93  93  97  100  100  80  100  100  93  95  93  94  93  93     Details       Multiple values from one day are sorted in reverse-chronological order          Exercise Target Goals: Exercise Program Goal: Individual exercise prescription set using results from initial 6 min walk test and THRR while considering  patient's activity barriers and safety.   Exercise Prescription Goal: Initial exercise prescription builds to 30-45 minutes a day of aerobic activity, 2-3 days per week.  Home exercise guidelines will be given to patient during program as part of exercise prescription that the participant will acknowledge.   Education: Aerobic Exercise: - Group verbal and visual presentation on the components of exercise prescription. Introduces F.I.T.T principle from ACSM for exercise prescriptions.  Reviews F.I.T.T. principles of aerobic exercise including progression. Written material given at graduation. Flowsheet Row Cardiac Rehab from 07/24/2023 in [facility] Cardiac and Pulmonary Rehab  Education need identified 07/24/23       Education: Resistance Exercise: - Group verbal and visual presentation on the components of exercise prescription. Introduces F.I.T.T principle from ACSM for exercise prescriptions  Reviews F.I.T.T. principles of resistance exercise including progression. Written material given at graduation.    Education: Exercise & Equipment Safety: - Individual verbal instruction and demonstration of equipment use and safety with use of the equipment. Flowsheet Row Cardiac Rehab from 07/24/2023 in [facility] Cardiac and Pulmonary Rehab  Date 07/24/23  Educator NT  Instruction Review Code 1- Verbalizes Understanding       Education: Exercise Physiology & General Exercise Guidelines: - Group verbal and written instruction with models to review the exercise physiology of the cardiovascular system and associated  critical values. Provides general exercise guidelines with specific guidelines to those with heart or lung disease.    Education: Flexibility, Balance, Mind/Body Relaxation: - Group verbal and visual presentation with interactive activity on the components of exercise prescription. Introduces F.I.T.T principle from ACSM for exercise prescriptions. Reviews F.I.T.T. principles of flexibility and balance exercise  training including progression. Also discusses the mind body connection.  Reviews various relaxation techniques to help reduce and manage stress (i.e. Deep breathing, progressive muscle relaxation, and visualization). Balance handout provided to take home. Written material given at graduation.   Activity Barriers & Risk Stratification:   6 Minute Walk:  6 Minute Walk     Row Name 10/16/23 1131         6 Minute Walk   Phase Discharge     Distance 1150 feet     Distance % Change 2.22 %     Distance Feet Change 25 ft     Walk Time 6 minutes     # of Rest Breaks 0     MPH 2.18     METS 2.78     RPE 9     Perceived Dyspnea  0     VO2 Peak 9.72     Symptoms No     Resting HR 97 bpm     Resting BP 116/70     Resting Oxygen Saturation  94 %     Exercise Oxygen Saturation  during 6 min walk 92 %     Max Ex. HR 129 bpm     Max Ex. BP 114/62     2 Minute Post BP 108/60              Oxygen Initial Assessment:   Oxygen Re-Evaluation:   Oxygen Discharge (Final Oxygen Re-Evaluation):   Initial Exercise Prescription:   Perform Capillary Blood Glucose checks as needed.  Exercise Prescription Changes:   Exercise Prescription Changes     Row Name 10/19/23 0800             Response to Exercise   Blood Pressure (Admit) 108/56       Blood Pressure (Exit) 100/56       Heart Rate (Admit) 64 bpm       Heart Rate (Exercise) 103 bpm       Heart Rate (Exit) 66 bpm       Oxygen Saturation (Admit) 93 %       Oxygen Saturation (Exercise) 91 %       Oxygen Saturation  (Exit) 92 %       Rating of Perceived Exertion (Exercise) 15       Symptoms none       Duration Continue with 30 min of aerobic exercise without signs/symptoms of physical distress.       Intensity THRR unchanged         Progression   Progression Continue to progress workloads to maintain intensity without signs/symptoms of physical distress.       Average METs 3.31         Resistance Training   Training Prescription Yes       Weight 5 lb       Reps 10-15         Interval Training   Interval Training No         Treadmill   MPH 2.5       Grade 0.5       Minutes 15       METs 3.09         Elliptical   Level 1       Speed 2.3       Minutes 15       METs 3         REL-XR   Level 1  Minutes 15       METs 4.6         Rower   Level 3       Watts 10       Minutes 15       METs 4         Track   Laps 35       Minutes 15       METs 2.9         Oxygen   Maintain Oxygen Saturation 88% or higher                Exercise Comments:   Exercise Comments     Row Name 10/25/23 1134           Exercise Comments Ellyson graduated today from  rehab with 36 sessions completed.  Details of the patient's exercise prescription and what She needs to do in order to continue the prescription and progress were discussed with patient.  Patient was given a copy of prescription and goals.  Patient verbalized understanding. Vernice plans to continue to exercise by joining a gym and walking at the mall with friends.                Exercise Goals and Review:   Exercise Goals Re-Evaluation :  Exercise Goals Re-Evaluation     Row Name 10/02/23 1133 10/19/23 0818           Exercise Goal Re-Evaluation   Exercise Goals Review Increase Physical Activity;Increase Strength and Stamina;Understanding of Exercise Prescription Increase Physical Activity;Increase Strength and Stamina;Understanding of Exercise Prescription      Comments Oralee Billow is doing well in rehab, she says she is  walking some at home but would like to exercise more on her own. Encouraged her to look into a gym to be able to do exercises she is doing here and have a good long term plan once she graduates. [name] is doing well in rehab and is close to graduating from the program. She recently completed her post and improved by 25 ft! She also did well beginning to use the elliptical at level 1 and the rower at level 3. We will continue to monitor her progress in the program.      Expected Outcomes STG: increase workload as able. LTG: Contineu exercise ti improve strength and stamina Short: Graduate. Long: Continue to exercise independently.               Discharge Exercise Prescription (Final Exercise Prescription Changes):  Exercise Prescription Changes - 10/19/23 0800       Response to Exercise   Blood Pressure (Admit) 108/56    Blood Pressure (Exit) 100/56    Heart Rate (Admit) 64 bpm    Heart Rate (Exercise) 103 bpm    Heart Rate (Exit) 66 bpm    Oxygen Saturation (Admit) 93 %    Oxygen Saturation (Exercise) 91 %    Oxygen Saturation (Exit) 92 %    Rating of Perceived Exertion (Exercise) 15    Symptoms none    Duration Continue with 30 min of aerobic exercise without signs/symptoms of physical distress.    Intensity THRR unchanged      Progression   Progression Continue to progress workloads to maintain intensity without signs/symptoms of physical distress.    Average METs 3.31      Resistance Training   Training Prescription Yes    Weight 5 lb    Reps 10-15  Interval Training   Interval Training No      Treadmill   MPH 2.5    Grade 0.5    Minutes 15    METs 3.09      Elliptical   Level 1    Speed 2.3    Minutes 15    METs 3      REL-XR   Level 1    Minutes 15    METs 4.6      Rower   Level 3    Watts 10    Minutes 15    METs 4      Track   Laps 35    Minutes 15    METs 2.9      Oxygen   Maintain Oxygen Saturation 88% or higher              Nutrition:  Target Goals: Understanding of nutrition guidelines, daily intake of sodium 1500mg , cholesterol 200mg , calories 30% from fat and 7% or less from saturated fats, daily to have 5 or more servings of fruits and vegetables.  Education: All About Nutrition: -Group instruction provided by verbal, written material, interactive activities, discussions, models, and posters to present general guidelines for heart healthy nutrition including fat, fiber, MyPlate, the role of sodium in heart healthy nutrition, utilization of the nutrition label, and utilization of this knowledge for meal planning. Follow up email sent as well. Written material given at graduation. Flowsheet Row Cardiac Rehab from 07/24/2023 in [facility] Cardiac and Pulmonary Rehab  Education need identified 07/24/23       Biometrics:   Post Biometrics - 10/16/23 1133        Post  Biometrics   Height 5\' 4"  (1.626 m)    Weight 175 lb 9.6 oz (79.7 kg)    Waist Circumference 38 inches    Hip Circumference 43 inches    Waist to Hip Ratio 0.88 %    BMI (Calculated) 30.13    Single Leg Stand 11.3 seconds             Nutrition Therapy Plan and Nutrition Goals:   Nutrition Assessments:  MEDIFICTS Score Key: >=70 Need to make dietary changes  40-70 Heart Healthy Diet <= 40 Therapeutic Level Cholesterol Diet  Flowsheet Row Cardiac Rehab from 07/24/2023 in [facility] Cardiac and Pulmonary Rehab  Picture Your Plate Total Score on Admission 71      Picture Your Plate Scores: <82 Unhealthy dietary pattern with much room for improvement. 41-50 Dietary pattern unlikely to meet recommendations for good health and room for improvement. 51-60 More healthful dietary pattern, with some room for improvement.  >60 Healthy dietary pattern, although there may be some specific behaviors that could be improved.    Nutrition Goals Re-Evaluation:  Nutrition Goals Re-Evaluation     Row Name 10/02/23 1137             Goals    Comment [name] reports she is doing well with nutrition. When asked what changes she felt she was most proud of, she says eating less carbs and more protien. States this has improved her energy level as well.       Expected Outcome STG: Continue to focus on balanced meals. LTG: maintain a heart healthy diet                Nutrition Goals Discharge (Final Nutrition Goals Re-Evaluation):  Nutrition Goals Re-Evaluation - 10/02/23 1137       Goals   Comment [name] reports she  is doing well with nutrition. When asked what changes she felt she was most proud of, she says eating less carbs and more protien. States this has improved her energy level as well.    Expected Outcome STG: Continue to focus on balanced meals. LTG: maintain a heart healthy diet             Psychosocial: Target Goals: Acknowledge presence or absence of significant depression and/or stress, maximize coping skills, provide positive support system. Participant is able to verbalize types and ability to use techniques and skills needed for reducing stress and depression.   Education: Stress, Anxiety, and Depression - Group verbal and visual presentation to define topics covered.  Reviews how body is impacted by stress, anxiety, and depression.  Also discusses healthy ways to reduce stress and to treat/manage anxiety and depression.  Written material given at graduation.   Education: Sleep Hygiene -Provides group verbal and written instruction about how sleep can affect your health.  Define sleep hygiene, discuss sleep cycles and impact of sleep habits. Review good sleep hygiene tips.    Initial Review & Psychosocial Screening:   Quality of Life Scores:   Quality of Life - 10/26/23 0743       Quality of Life Scores   Health/Function Pre 25.43 %    Health/Function Post 27.81 %    Health/Function % Change 9.36 %    Socioeconomic Pre 23.31 %    Socioeconomic Post 24.86 %    Socioeconomic % Change  6.65 %     Psych/Spiritual Pre 29.64 %    Psych/Spiritual Post 30 %    Psych/Spiritual % Change 1.21 %    Family Pre 25.3 %    Family Post 24.88 %    Family % Change -1.66 %    GLOBAL Pre 25.77 %    GLOBAL Post 27.26 %    GLOBAL % Change 5.78 %            Scores of 19 and below usually indicate a poorer quality of life in these areas.  A difference of  2-3 points is a clinically meaningful difference.  A difference of 2-3 points in the total score of the Quality of Life Index has been associated with significant improvement in overall quality of life, self-image, physical symptoms, and general health in studies assessing change in quality of life.  PHQ-9: Review Flowsheet       10/26/2023 07/24/2023  Depression screen PHQ 2/9  Decreased Interest 0 0  Down, Depressed, Hopeless 0 0  PHQ - 2 Score 0 0  Altered sleeping 0 0  Tired, decreased energy 0 0  Change in appetite 0 0  Feeling bad or failure about yourself  0 0  Trouble concentrating 0 0  Moving slowly or fidgety/restless 0 0  Suicidal thoughts 0 0  PHQ-9 Score 0 0  Difficult doing work/chores Not difficult at all -   Interpretation of Total Score  Total Score Depression Severity:  1-4 = Minimal depression, 5-9 = Mild depression, 10-14 = Moderate depression, 15-19 = Moderately severe depression, 20-27 = Severe depression   Psychosocial Evaluation and Intervention:   Psychosocial Re-Evaluation:  Psychosocial Re-Evaluation     Row Name 10/02/23 1135             Psychosocial Re-Evaluation   Comments [name] reports she has a good support system, denies any stress, anxiety or depression. Sleeping well, ~8hrs per night, wakes well rested.       Expected Outcomes  STG: Continue to exercise at rehab and sleep well. LTG: maintain good mental health habits.       Interventions Encouraged to attend Cardiac Rehabilitation for the exercise       Continue Psychosocial Services  Follow up required by staff                 Psychosocial Discharge (Final Psychosocial Re-Evaluation):  Psychosocial Re-Evaluation - 10/02/23 1135       Psychosocial Re-Evaluation   Comments [name] reports she has a good support system, denies any stress, anxiety or depression. Sleeping well, ~8hrs per night, wakes well rested.    Expected Outcomes STG: Continue to exercise at rehab and sleep well. LTG: maintain good mental health habits.    Interventions Encouraged to attend Cardiac Rehabilitation for the exercise    Continue Psychosocial Services  Follow up required by staff             Vocational Rehabilitation: Provide vocational rehab assistance to qualifying candidates.   Vocational Rehab Evaluation & Intervention:   Education: Education Goals: Education classes will be provided on a variety of topics geared toward better understanding of heart health and risk factor modification. Participant will state understanding/return demonstration of topics presented as noted by education test scores.  Learning Barriers/Preferences:   General Cardiac Education Topics:  AED/CPR: - Group verbal and written instruction with the use of models to demonstrate the basic use of the AED with the basic ABC's of resuscitation.   Anatomy and Cardiac Procedures: - Group verbal and visual presentation and models provide information about basic cardiac anatomy and function. Reviews the testing methods done to diagnose heart disease and the outcomes of the test results. Describes the treatment choices: Medical Management, Angioplasty, or Coronary Bypass Surgery for treating various heart conditions including Myocardial Infarction, Angina, Valve Disease, and Cardiac Arrhythmias.  Written material given at graduation. Flowsheet Row Cardiac Rehab from 07/24/2023 in [facility] Cardiac and Pulmonary Rehab  Education need identified 07/24/23       Medication Safety: - Group verbal and visual instruction to review commonly prescribed medications  for heart and lung disease. Reviews the medication, class of the drug, and side effects. Includes the steps to properly store meds and maintain the prescription regimen.  Written material given at graduation.   Intimacy: - Group verbal instruction through game format to discuss how heart and lung disease can affect sexual intimacy. Written material given at graduation..   Know Your Numbers and Heart Failure: - Group verbal and visual instruction to discuss disease risk factors for cardiac and pulmonary disease and treatment options.  Reviews associated critical values for Overweight/Obesity, Hypertension, Cholesterol, and Diabetes.  Discusses basics of heart failure: signs/symptoms and treatments.  Introduces Heart Failure Zone chart for action plan for heart failure.  Written material given at graduation.   Infection Prevention: - Provides verbal and written material to individual with discussion of infection control including proper hand washing and proper equipment cleaning during exercise session. Flowsheet Row Cardiac Rehab from 07/24/2023 in [facility] Cardiac and Pulmonary Rehab  Date 07/24/23  Educator NT  Instruction Review Code 1- Verbalizes Understanding       Falls Prevention: - Provides verbal and written material to individual with discussion of falls prevention and safety. Flowsheet Row Cardiac Rehab from 07/24/2023 in [facility] Cardiac and Pulmonary Rehab  Date 07/03/23  Educator SB  Instruction Review Code 1- Verbalizes Understanding       Other: -Provides group and verbal instruction on various topics (see  comments)   Knowledge Questionnaire Score:  Knowledge Questionnaire Score - 10/26/23 0741       Knowledge Questionnaire Score   Pre Score 22/26    Post Score 23/26             Core Components/Risk Factors/Patient Goals at Admission:   Education:Diabetes - Individual verbal and written instruction to review signs/symptoms of diabetes, desired ranges of  glucose level fasting, after meals and with exercise. Acknowledge that pre and post exercise glucose checks will be done for 3 sessions at entry of program.   Core Components/Risk Factors/Patient Goals Review:   Goals and Risk Factor Review     Row Name 10/02/23 1138             Core Components/Risk Factors/Patient Goals Review   Personal Goals Review Tobacco Cessation       Review [name] reports she is doing well with no cigarettes, now ~50months tabacco free. Commended and encouraged her to continue this change as its a big one she should be proud of.       Expected Outcomes STG: Continue to be tabacco free. LTG: continue with lifestyle changes to help control cardiac risk factors. Remain tobacco free for the long term.                Core Components/Risk Factors/Patient Goals at Discharge (Final Review):   Goals and Risk Factor Review - 10/02/23 1138       Core Components/Risk Factors/Patient Goals Review   Personal Goals Review Tobacco Cessation    Review [name] reports she is doing well with no cigarettes, now ~7months tabacco free. Commended and encouraged her to continue this change as its a big one she should be proud of.    Expected Outcomes STG: Continue to be tabacco free. LTG: continue with lifestyle changes to help control cardiac risk factors. Remain tobacco free for the long term.             ITP Comments:  ITP Comments     Row Name 10/11/23 1342 10/25/23 1134         ITP Comments 30 Day review completed. Medical Director ITP review done, changes made as directed, and signed approval by Medical Director. Hadli graduated today from  rehab with 36 sessions completed.  Details of the patient's exercise prescription and what She needs to do in order to continue the prescription and progress were discussed with patient.  Patient was given a copy of prescription and goals.  Patient verbalized understanding. Daneshia plans to continue to exercise by joining a gym and  walking at the mall with friends.               Comments: Discharge ITP updated

## 2023-10-27 ENCOUNTER — Ambulatory Visit: Payer: Medicare Other | Attending: Internal Medicine | Admitting: Internal Medicine

## 2023-10-27 ENCOUNTER — Telehealth: Payer: Self-pay | Admitting: *Deleted

## 2023-10-27 ENCOUNTER — Other Ambulatory Visit: Payer: Self-pay | Admitting: Internal Medicine

## 2023-10-27 NOTE — Telephone Encounter (Signed)
 Attempted to call pt to reschedule her appointment with Dr. Chancy Comber. LVMTCB. If she is doing well, okay to make her appointment for 6 month f/u (due mid Nov. 2025). There are open slots at end of Sept 2025 also, if pt prefers.

## 2023-10-31 ENCOUNTER — Ambulatory Visit: Payer: Self-pay | Admitting: Internal Medicine

## 2023-10-31 DIAGNOSIS — I361 Nonrheumatic tricuspid (valve) insufficiency: Secondary | ICD-10-CM

## 2023-10-31 DIAGNOSIS — I340 Nonrheumatic mitral (valve) insufficiency: Secondary | ICD-10-CM

## 2023-11-01 ENCOUNTER — Other Ambulatory Visit: Payer: Self-pay | Admitting: Internal Medicine

## 2023-11-01 DIAGNOSIS — I4821 Permanent atrial fibrillation: Secondary | ICD-10-CM

## 2023-11-02 ENCOUNTER — Telehealth: Payer: Self-pay | Admitting: Internal Medicine

## 2023-11-02 MED ORDER — APIXABAN 5 MG PO TABS: 5.0000 mg | ORAL_TABLET | Freq: Two times a day (BID) | ORAL | 5 refills | Status: DC

## 2023-11-02 NOTE — Telephone Encounter (Signed)
 Prescription refill request for Eliquis  received. Indication: AF Last office visit: 07/31/23  Alverna John MD Scr: 0.86 on 10/26/23  Epic Age: 68 Weight: 76.2kg  Based on above findings Eliquis  5mg  twice daily is the appropriate dose.  Refill approved.

## 2023-11-02 NOTE — Telephone Encounter (Signed)
*  STAT* If patient is at the pharmacy, call can be transferred to refill team.   1. Which medications need to be refilled? (please list name of each medication and dose if known) apixaban  (ELIQUIS ) 5 MG TABS tablet    2. Would you like to learn more about the convenience, safety, & potential cost savings by using the [facility]   3. Are you open to using the [facility] (Type [facility].  ).   4. Which pharmacy/location (including street and city if local pharmacy) is medication to be sent to? [id number] - [place], [place] - [address]    5. Do they need a 30 day or 90 day supply? 30 day   Pt out of this medication

## 2023-11-29 ENCOUNTER — Other Ambulatory Visit: Payer: Self-pay | Admitting: Internal Medicine

## 2023-11-29 DIAGNOSIS — I4821 Permanent atrial fibrillation: Secondary | ICD-10-CM

## 2024-01-04 ENCOUNTER — Other Ambulatory Visit: Payer: Self-pay | Admitting: Nurse Practitioner

## 2024-01-04 DIAGNOSIS — E0590 Thyrotoxicosis, unspecified without thyrotoxic crisis or storm: Secondary | ICD-10-CM

## 2024-01-05 ENCOUNTER — Ambulatory Visit
Admission: RE | Admit: 2024-01-05 | Discharge: 2024-01-05 | Disposition: A | Discharge status: Home / Self Care | Source: Ambulatory Visit | Attending: Nurse Practitioner | Admitting: Nurse Practitioner

## 2024-01-05 DIAGNOSIS — E0590 Thyrotoxicosis, unspecified without thyrotoxic crisis or storm: Secondary | ICD-10-CM

## 2024-01-10 ENCOUNTER — Telehealth: Payer: Self-pay | Admitting: Internal Medicine

## 2024-01-10 ENCOUNTER — Other Ambulatory Visit: Payer: Self-pay | Admitting: Gastroenterology

## 2024-01-10 MED ORDER — DILTIAZEM HCL ER COATED BEADS 360 MG PO CP24: 360.0000 mg | ORAL_CAPSULE | Freq: Every day | ORAL | 0 refills | Status: DC

## 2024-01-10 NOTE — Telephone Encounter (Signed)
 Called and made patient aware that per Dr. Loni refill for Diltiazem  360 mg daily has been refilled and sent to patient's pharmacy. Made patient aware to keep upcoming appointment with provider scheduled 9/15 @8 :20. Understanding verbalized.

## 2024-01-10 NOTE — Telephone Encounter (Signed)
 Pt c/o medication issue:  1. Name of Medication: Diltiazem    2. How are you currently taking this medication (dosage and times per day)? 360 MG 1 time daily   3. Are you having a reaction (difficulty breathing--STAT)? Na   4. What is your medication issue? Pt stated she is on this med and needs a refill but this med is not on her med list.    Best number [phone]

## 2024-01-10 NOTE — Telephone Encounter (Signed)
 Called patient and patient would like medication advise from provider on Diltiazem . Per patient she's been taking medications and needs refills. Made patient aware that provider is out of office, but this will be forward. Offered and look for patient an appt with Dr. Job. Per patient she already has an appt scheduled with provider. Made patient aware that provider has left for today but this will be forward. Understanding verbalized

## 2024-01-10 NOTE — Telephone Encounter (Signed)
 Called patient and she would like to continue medication and has been taking since discharged from hospital 06/21/23. Made patient aware that this will forward to provider for advice.

## 2024-01-21 ENCOUNTER — Other Ambulatory Visit: Payer: Self-pay | Admitting: Internal Medicine

## 2024-01-21 DIAGNOSIS — I4821 Permanent atrial fibrillation: Secondary | ICD-10-CM

## 2024-01-21 DIAGNOSIS — I5033 Acute on chronic diastolic (congestive) heart failure: Secondary | ICD-10-CM

## 2024-02-07 ENCOUNTER — Other Ambulatory Visit: Payer: Self-pay

## 2024-02-07 DIAGNOSIS — K7031 Alcoholic cirrhosis of liver with ascites: Secondary | ICD-10-CM

## 2024-02-14 ENCOUNTER — Telehealth: Payer: Self-pay | Admitting: Internal Medicine

## 2024-02-14 DIAGNOSIS — I5033 Acute on chronic diastolic (congestive) heart failure: Secondary | ICD-10-CM

## 2024-02-14 DIAGNOSIS — I4821 Permanent atrial fibrillation: Secondary | ICD-10-CM

## 2024-02-14 MED ORDER — DIGOXIN 125 MCG PO TABS
0.0625 mg | ORAL_TABLET | Freq: Every day | ORAL | 0 refills | Status: DC
Start: 2024-02-14 — End: 2024-05-01

## 2024-02-14 NOTE — Telephone Encounter (Signed)
*  STAT* If patient is at the pharmacy, call can be transferred to refill team.   1. Which medications need to be refilled? (please list name of each medication and dose if known)   digoxin  (LANOXIN ) 0.125 MG tablet   2. Would you like to learn more about the convenience, safety, & potential cost savings by using the [facility]?   3. Are you open to using the [facility] (Type [facility]. ).  4. Which pharmacy/location (including street and city if local pharmacy) is medication to be sent to?  [id number] [place], [place] - [address]   5. Do they need a 30 day or 90 day supply?   30 day  Patient stated she is completely out of this medication.   Patient has an appointment with Dr. Loni on 9/15.

## 2024-02-14 NOTE — Telephone Encounter (Signed)
 Pt's medication was sent to pt's pharmacy as requested. Confirmation received.

## 2024-02-15 ENCOUNTER — Ambulatory Visit
Admission: RE | Admit: 2024-02-15 | Discharge: 2024-02-15 | Disposition: A | Discharge status: Home / Self Care | Source: Ambulatory Visit | Attending: Gastroenterology | Admitting: Gastroenterology

## 2024-02-15 DIAGNOSIS — K7031 Alcoholic cirrhosis of liver with ascites: Secondary | ICD-10-CM | POA: Diagnosis present

## 2024-02-19 ENCOUNTER — Ambulatory Visit: Attending: Internal Medicine | Admitting: Internal Medicine

## 2024-02-19 ENCOUNTER — Other Ambulatory Visit (HOSPITAL_COMMUNITY): Payer: Self-pay

## 2024-02-19 VITALS — BP 100/60 | HR 96 | Ht 62.0 in | Wt 172.5 lb

## 2024-02-19 DIAGNOSIS — I2510 Atherosclerotic heart disease of native coronary artery without angina pectoris: Secondary | ICD-10-CM

## 2024-02-19 DIAGNOSIS — I5033 Acute on chronic diastolic (congestive) heart failure: Secondary | ICD-10-CM

## 2024-02-19 DIAGNOSIS — I10 Essential (primary) hypertension: Secondary | ICD-10-CM | POA: Diagnosis not present

## 2024-02-19 DIAGNOSIS — I361 Nonrheumatic tricuspid (valve) insufficiency: Secondary | ICD-10-CM

## 2024-02-19 DIAGNOSIS — E782 Mixed hyperlipidemia: Secondary | ICD-10-CM

## 2024-02-19 DIAGNOSIS — I340 Nonrheumatic mitral (valve) insufficiency: Secondary | ICD-10-CM

## 2024-02-19 DIAGNOSIS — Z79899 Other long term (current) drug therapy: Secondary | ICD-10-CM

## 2024-02-19 DIAGNOSIS — Z7901 Long term (current) use of anticoagulants: Secondary | ICD-10-CM

## 2024-02-19 DIAGNOSIS — Z9889 Other specified postprocedural states: Secondary | ICD-10-CM | POA: Diagnosis not present

## 2024-02-19 DIAGNOSIS — I4821 Permanent atrial fibrillation: Secondary | ICD-10-CM | POA: Diagnosis not present

## 2024-02-19 MED ORDER — DILTIAZEM HCL ER COATED BEADS 240 MG PO CP24
240.0000 mg | ORAL_CAPSULE | Freq: Every day | ORAL | 0 refills | Status: DC
  Filled 2024-02-19: qty 30, 30d supply, fill #0

## 2024-02-19 NOTE — Patient Instructions (Addendum)
 Medication Instructions:  Your physician has recommended you make the following change in your medication:  1-Decrease Cardizem  240 mg by mouth daily. Please pick-up at pharmacy downstairs.  *If you need a refill on your cardiac medications before your next appointment, please call your pharmacy*  Lab Work: If you have labs (blood work) drawn today and your tests are completely normal, you will receive your results only by: MyChart Message (if you have MyChart) OR A paper copy in the mail If you have any lab test that is abnormal or we need to change your treatment, we will call you to review the results.  Testing/Procedures: Your physician has requested that you have an echocardiogram in March. Echocardiography is a painless test that uses sound waves to create images of your heart. It provides your doctor with information about the size and shape of your heart and how well your heart's chambers and valves are working. This procedure takes approximately one hour. There are no restrictions for this procedure. Please do NOT wear cologne, perfume, aftershave, or lotions (deodorant is allowed). Please arrive 15 minutes prior to your appointment time.  Please note: We ask at that you not bring children with you during ultrasound (echo/ vascular) testing. Due to room size and safety concerns, children are not allowed in the ultrasound rooms during exams. Our front office staff cannot provide observation of children in our lobby area while testing is being conducted. An adult accompanying a patient to their appointment will only be allowed in the ultrasound room at the discretion of the ultrasound technician under special circumstances. We apologize for any inconvenience. Follow-Up: At [facility], you and your health needs are our priority.  As part of our continuing mission to provide you with exceptional heart care, our providers are all part of one team.  This team includes your primary  Cardiologist (physician) and Advanced Practice Providers or APPs (Physician Assistants and Nurse Practitioners) who all work together to provide you with the care you need, when you need it.  Your next appointment:   6 month   Provider:   Dr. Loni  We recommend signing up for the patient portal called MyChart.  Sign up information is provided on this After Visit Summary.  MyChart is used to connect with patients for Virtual Visits (Telemedicine).  Patients are able to view lab/test results, encounter notes, upcoming appointments, etc.  Non-urgent messages can be sent to your provider as well.   To learn more about what you can do with MyChart, go to [email or web address].   Other Instructions

## 2024-02-19 NOTE — Progress Notes (Signed)
 Cardiology Office Note:  .   Date:  02/19/2024  ID:  Rhonda [name], DOB [birth year], MRN [id number] PCP: Delores Delorise Lunger, FNP  [facility] Providers Cardiologist:  Soyla DELENA Merck, MD    History of Present Illness: .   Rhonda [name] is a 68 y.o. female.  Discussed the use of AI scribe software for clinical note transcription with the patient, who gave verbal consent to proceed.  History of Present Illness Rhonda [name] is a 68 year old female with mitral and tricuspid valve surgery and atrial fibrillation who presents for cardiovascular follow-up.  She underwent mitral valve surgery on June 14, 2023, with significant improvement in symptoms, including reduced swelling. Diuretics were discontinued post-surgery. She experiences no dizziness or excessive leg swelling.  She has atrial fibrillation and is on Eliquis  5 mg twice daily, diltiazem  320 mg daily, metoprolol  25 mg twice daily, and digoxin  half a tablet daily. She has been taking diltiazem  consistently since her surgery. No fainting or unusual sensations.  She is on Lipitor 20 mg daily for cholesterol management. Recent lab work shows a hemoglobin A1c of 5.7, and her cholesterol panel is within normal limits with LDL at 50, triglycerides at 39, and HDL at 43.  She attends a heart class monthly. No recent digoxin  level checks, but it was normal in February. She takes spironolactone  100 mg daily and Lasix  40 mg daily. She manages her medications with financial constraints, paying $70 monthly out of pocket for prescriptions.    ROS: negative except per HPI above.  Studies Reviewed: SABRA   EKG Interpretation Date/Time:  Monday February 19 2024 09:28:46 EDT Ventricular Rate:  80 PR Interval:    QRS Duration:  96 QT Interval:  374 QTC Calculation: 431 R Axis:   47  Text Interpretation: Accelerated junctional rhythm When compared with ECG of 31-Jul-2023 13:09, Nonspecific T wave  abnormality no longer evident in Anterolateral leads Confirmed by Merck Soyla ([id number]) on 02/19/2024 11:25:01 AM    Results LABS Hemoglobin A1c: 5.7 (10/2023) Cholesterol panel: LDL 50, Triglycerides 39, HDL 43 (10/2023)  RADIOLOGY Thyroid  ultrasound: Nodule on thyroid  (02/2024) Liver ultrasound: Normal (02/2024)  DIAGNOSTIC EKG: Atrial fibrillation, accelerated rhythm (07/2023) Risk Assessment/Calculations:    CHA2DS2-VASc Score =     This indicates a  % annual risk of stroke. The patient's score is based upon:       Physical Exam:   VS:  BP 100/60   Pulse 96   Ht 5' 2 (1.575 m)   Wt 172 lb 8 oz (78.2 kg)   SpO2 96%   BMI 31.55 kg/m    Wt Readings from Last 3 Encounters:  02/19/24 172 lb 8 oz (78.2 kg)  10/16/23 175 lb 9.6 oz (79.7 kg)  07/31/23 168 lb (76.2 kg)     Physical Exam GENERAL: Alert, cooperative, well developed, no acute distress HEENT: Normocephalic, normal oropharynx, moist mucous membranes CHEST: Clear to auscultation bilaterally, no wheezes, rhonchi, or crackles CARDIOVASCULAR: Irregular heart rhythm, slight heart murmur, S1 and S2 normal ABDOMEN: Soft, non-tender, non-distended, without organomegaly, normal bowel sounds EXTREMITIES: No cyanosis or edema NEUROLOGICAL: Cranial nerves grossly intact, moves all extremities without gross motor or sensory deficit   ASSESSMENT AND PLAN: .    Assessment and Plan Assessment & Plan Atrial fibrillation  Med management Status post mitral valve repair with residual mitral regurgitation Atrial fibrillation persists with residual mitral regurgitation. Heart rate normal and blood pressure low but stable.Abnormal rhythm,  possibly accelerated junctional rhythm. Normal cardiac function. - Order EKG to assess current rhythm status. - Continue Eliquis  5 mg twice a day. - Reduce diltiazem  to 240 mg daily and monitor response. - Repeat echocardiogram in March 2026.  Heart failure with preserved EF, NYHA class  I symptoms Heart failure well-managed. No excessive swelling or symptoms of dizziness or fainting. - Continue Lasix  40 mg daily. - Continue spironolactone  100 mg daily.  Hypertension Blood pressure low. No symptoms of dizziness or fainting. Does have some fatigue. - Continue metoprolol  25 mg twice a day. - continues on diltiazem , decrease dose to 240 mg daily given hypotension  Hyperlipidemia CAD s/p PCI Cholesterol panel excellent. Lipitor continued at current dose. - Continue Lipitor 20 mg daily.  Follow-Up Follow-up plans discussed to monitor heart condition and medication adjustments. - Schedule follow-up appointment in April. - Ensure EKG is performed today. - Monitor response to diltiazem  dose reduction and adjust as necessary.      Soyla Merck, MD, FACC

## 2024-02-22 ENCOUNTER — Telehealth: Payer: Self-pay

## 2024-02-22 NOTE — Telephone Encounter (Signed)
   Pre-operative Risk Assessment    Patient Name: Rhonda [name]  DOB: 11-04-1955 MRN: [id number]   Date of last office visit: 02/19/24 SOYLA MERCK, MD Date of next office visit: NONE  Request for Surgical Clearance    Procedure:  FILLING  Date of Surgery:  Clearance TBD                                Surgeon:  DR SWAZILAND THOMAS Surgeon's Group or Practice Name:  [facility] Phone number:  [phone] Fax number:  [phone]   Type of Clearance Requested:   - Medical  - Pharmacy:  Hold Apixaban  (Eliquis )     Type of Anesthesia:  Local    Additional requests/questions:    SignedLucie DELENA Ku   02/22/2024, 10:19 AM

## 2024-02-27 NOTE — Telephone Encounter (Addendum)
   Patient Name: Rhonda [name]  DOB: 28-Sep-1955 MRN: [id number]  Primary Cardiologist: Soyla DELENA Merck, MD  Chart reviewed as part of pre-operative protocol coverage.   Simple dental extractions (i.e. 1-2 teeth)/ fillings are considered low risk procedures per guidelines and generally do not require any specific cardiac clearance. It is also generally accepted that for simple extractions and dental cleanings, there is no need to interrupt blood thinner therapy.  SBE prophylaxis is required for the patient from a cardiac standpoint. Recommend Amoxicillin  2,000mg  (four 500mg  tablets) 30-60 minutes prior to dental appointment.   I will route this recommendation to the requesting party via Epic fax function and remove from pre-op pool.  Please call with questions.  Rita Vialpando E Seferino Oscar, PA-C 02/27/2024, 8:25 AM

## 2024-02-27 NOTE — Telephone Encounter (Signed)
 No need to hold anticoagulation for simple dental procedures

## 2024-03-07 ENCOUNTER — Other Ambulatory Visit: Payer: Self-pay | Admitting: Internal Medicine

## 2024-03-19 ENCOUNTER — Other Ambulatory Visit: Payer: Self-pay | Admitting: Internal Medicine

## 2024-03-19 DIAGNOSIS — I4821 Permanent atrial fibrillation: Secondary | ICD-10-CM

## 2024-03-19 DIAGNOSIS — I5033 Acute on chronic diastolic (congestive) heart failure: Secondary | ICD-10-CM

## 2024-03-26 ENCOUNTER — Encounter: Payer: Self-pay | Admitting: Internal Medicine

## 2024-03-26 ENCOUNTER — Other Ambulatory Visit: Payer: Self-pay | Admitting: Internal Medicine

## 2024-03-26 MED ORDER — DILTIAZEM HCL ER COATED BEADS 240 MG PO CP24: 240.0000 mg | ORAL_CAPSULE | Freq: Every day | ORAL | 3 refills | Status: AC

## 2024-03-26 MED ORDER — ATORVASTATIN CALCIUM 20 MG PO TABS: 20.0000 mg | ORAL_TABLET | Freq: Every evening | ORAL | 3 refills | Status: AC

## 2024-03-26 NOTE — Telephone Encounter (Signed)
  error

## 2024-04-02 ENCOUNTER — Ambulatory Visit: Admitting: Gastroenterology

## 2024-04-02 ENCOUNTER — Encounter: Payer: Self-pay | Admitting: Gastroenterology

## 2024-04-02 ENCOUNTER — Other Ambulatory Visit (INDEPENDENT_AMBULATORY_CARE_PROVIDER_SITE_OTHER)

## 2024-04-02 VITALS — BP 100/58 | HR 106 | Ht 62.5 in | Wt 171.5 lb

## 2024-04-02 DIAGNOSIS — K7030 Alcoholic cirrhosis of liver without ascites: Secondary | ICD-10-CM | POA: Diagnosis not present

## 2024-04-02 DIAGNOSIS — K7581 Nonalcoholic steatohepatitis (NASH): Secondary | ICD-10-CM | POA: Diagnosis not present

## 2024-04-02 LAB — CBC WITH DIFFERENTIAL/PLATELET
Basophils Absolute: 0 K/uL (ref 0.0–0.1)
Basophils Relative: 0.5 % (ref 0.0–3.0)
Eosinophils Absolute: 0.1 K/uL (ref 0.0–0.7)
Eosinophils Relative: 1.8 % (ref 0.0–5.0)
HCT: 41.3 % (ref 36.0–46.0)
Hemoglobin: 13.5 g/dL (ref 12.0–15.0)
Lymphocytes Relative: 13.9 % (ref 12.0–46.0)
Lymphs Abs: 1.1 K/uL (ref 0.7–4.0)
MCHC: 32.7 g/dL (ref 30.0–36.0)
MCV: 94.8 fl (ref 78.0–100.0)
Monocytes Absolute: 0.5 K/uL (ref 0.1–1.0)
Monocytes Relative: 7.1 % (ref 3.0–12.0)
Neutro Abs: 5.9 K/uL (ref 1.4–7.7)
Neutrophils Relative %: 76.7 % (ref 43.0–77.0)
Platelets: 181 K/uL (ref 150.0–400.0)
RBC: 4.36 Mil/uL (ref 3.87–5.11)
RDW: 13.5 % (ref 11.5–15.5)
WBC: 7.7 K/uL (ref 4.0–10.5)

## 2024-04-02 LAB — PROTIME-INR
INR: 1.4 ratio — ABNORMAL HIGH (ref 0.8–1.0)
Prothrombin Time: 14.8 s — ABNORMAL HIGH (ref 9.6–13.1)

## 2024-04-02 LAB — COMPREHENSIVE METABOLIC PANEL WITH GFR
ALT: 16 U/L (ref 0–35)
AST: 16 U/L (ref 0–37)
Albumin: 4.7 g/dL (ref 3.5–5.2)
Alkaline Phosphatase: 73 U/L (ref 39–117)
BUN: 20 mg/dL (ref 6–23)
CO2: 32 meq/L (ref 19–32)
Calcium: 9.7 mg/dL (ref 8.4–10.5)
Chloride: 97 meq/L (ref 96–112)
Creatinine, Ser: 1.15 mg/dL (ref 0.40–1.20)
GFR: 49.58 mL/min — ABNORMAL LOW (ref >60.00)
Glucose, Bld: 104 mg/dL — ABNORMAL HIGH (ref 70–99)
Potassium: 4.1 meq/L (ref 3.5–5.1)
Sodium: 137 meq/L (ref 135–145)
Total Bilirubin: 0.6 mg/dL (ref 0.2–1.2)
Total Protein: 7.2 g/dL (ref 6.0–8.3)

## 2024-04-02 NOTE — Progress Notes (Unsigned)
 Rhonda [name]    [id number]    01-05-1956  Primary Care Physician:Brown, Delorise Lunger, FNP  Referring Physician: Delores Delorise Lunger, FNP [address] [place],  [place] [address]   Chief complaint: Cirrhosis follow-up  Discussed the use of AI scribe software for clinical note transcription with the patient, who gave verbal consent to proceed.  History of Present Illness Rhonda [name] is a 68 year old female who presents for follow-up regarding her liver function and overall health.  Liver function and hepatic congestion/cirrhosis - Previously elevated liver enzymes, believed to be secondary to cardiac backflow pressure prior to heart valve repair in addition to alcohol related cirrhosis - Ultrasound demonstrated hepatic steatosis and congestion attributed to heart disease - No concerning findings on liver imaging - Concern regarding ongoing liver function  Fluid retention and peripheral edema - Significant improvement in fluid retention following heart valve repair on June 14, 2023 - Resolution of ankle swelling and loss of fluid weight - No current symptoms of peripheral edema  Gastrointestinal surveillance and history - Colonoscopy in 2022 with removal of two small polyps and EGD with findings of H. pylori gastritis, s/p treatment for bacterial infection - Not due for repeat colonoscopy until 2029 - Upper endoscopy in 2024 confirmed clearance of H. pylori - No black or bloody stools - Regular bowel movements  Dietary habits and alcohol use - Avoids alcohol and sugary foods - Uses sweet cream in coffee instead of sugar  Lower extremity fatigue - Experiences tiredness in feet after prolonged standing - Manages symptoms with cushions under toes  US  abd RUQ 02/15/24 Indeterminate echogenic lesion in the right lobe of the liver, possibly artifactual and related to volume averaging with adjacent fat plane. Further evaluation with  CT or MRI is recommended.  Outpatient Encounter Medications as of 04/02/2024  Medication Sig   acetaminophen  (TYLENOL ) 500 MG tablet Take 500 mg by mouth every 6 (six) hours as needed.   albuterol  (PROVENTIL  HFA;VENTOLIN  HFA) 108 (90 Base) MCG/ACT inhaler Inhale 2 puffs into the lungs every 6 (six) hours as needed for wheezing or shortness of breath.   amoxicillin  (AMOXIL ) 500 MG capsule Take 4 capsules by mouth one time 30-60 minutes prior to dental appointment   apixaban  (ELIQUIS ) 5 MG TABS tablet Take 1 tablet (5 mg total) by mouth 2 (two) times daily.   atorvastatin  (LIPITOR) 20 MG tablet Take 1 tablet (20 mg total) by mouth every evening.   digoxin  (LANOXIN ) 0.125 MG tablet Take 0.5 tablets (0.0625 mg total) by mouth daily.   furosemide  (LASIX ) 40 MG tablet Take 40 mg by mouth daily.   Homeopathic Products [facility] MENTAL FOCUS PO) Take 2 capsules by mouth in the morning. Amare MentaFocus   metoprolol  tartrate (LOPRESSOR ) 25 MG tablet Take 1 tablet (25 mg total) by mouth 2 (two) times daily.   Multiple Vitamins-Iron  (MULTIVITAMINS WITH IRON ) TABS tablet Take 1 tablet by mouth daily.   mupirocin ointment (BACTROBAN) 2 % Apply 1 Application topically as needed.   OVER THE COUNTER MEDICATION Take 1 packet by mouth daily. Amare Edge+ Grape Plant-Based Nootropics   pantoprazole  (PROTONIX ) 40 MG tablet TAKE 1 TABLET BY MOUTH EVERY DAY   Probiotic Product (PROBIOTIC PO) Take 1 packet by mouth daily. MentaBiotics Advance Gut Brain Nutrition Prebiotic/Probiotic/Phytobiotics   spironolactone  (ALDACTONE ) 100 MG tablet TAKE 1 TABLET BY MOUTH EVERY DAY   diltiazem  (CARDIZEM  CD) 240 MG 24 hr  capsule Take 1 capsule (240 mg total) by mouth daily. (Patient not taking: Reported on 04/02/2024)   NON FORMULARY Take 2 capsules by mouth in the morning. Mood+ by walton CAIN (Patient not taking: Reported on 04/02/2024)   No facility-administered encounter medications on file as of 04/02/2024.    Allergies as  of 04/02/2024 - Review Complete 04/02/2024  Allergen Reaction Noted   Ace inhibitors Cough 02/04/2016   Prednisone  Itching and Swelling 07/03/2018    Past Medical History:  Diagnosis Date   Allergy    Atrial fibrillation (HCC)    CAD (coronary artery disease) 08/2009   BMS to RCA, non-obstructive in CX and LAD 2011   Cataract    CHF (congestive heart failure) (HCC)    Cirrhosis (HCC)    Dysrhythmia    A. Fib   Edema, peripheral    Heart murmur    Hypertension    Myocardial infarction [facility])    Nausea    Pneumonia    January 2020   Sleep apnea    No CPAP   Trichomoniasis 06/05/2018    Past Surgical History:  Procedure Laterality Date   BIOPSY  08/06/2020   Procedure: BIOPSY;  Surgeon: Teressa Toribio SQUIBB, MD;  Location: [facility];  Service: Gastroenterology;;   CATARACT EXTRACTION W/ INTRAOCULAR LENS IMPLANT Bilateral    CESAREAN SECTION     x 1. no BTL   CLIPPING OF ATRIAL APPENDAGE N/A 06/14/2023   Procedure: CLIPPING OF ATRIAL APPENDAGE USING 50 MM MEDTRONIC PENDITURE LAA CLIP;  Surgeon: Maryjane Mt, MD;  Location: [facility];  Service: Open Heart Surgery;  Laterality: N/A;   COLONOSCOPY WITH PROPOFOL  N/A 08/06/2020   Procedure: COLONOSCOPY WITH PROPOFOL ;  Surgeon: Teressa Toribio SQUIBB, MD;  Location: [facility];  Service: Gastroenterology;  Laterality: N/A;   CORONARY ANGIOPLASTY WITH STENT PLACEMENT  06/2010   DILATION AND CURETTAGE OF UTERUS  1982   For SAB   ESOPHAGOGASTRODUODENOSCOPY (EGD) WITH PROPOFOL  N/A 08/06/2020   Procedure: ESOPHAGOGASTRODUODENOSCOPY (EGD) WITH PROPOFOL ;  Surgeon: Teressa Toribio SQUIBB, MD;  Location: [facility];  Service: Gastroenterology;  Laterality: N/A;   HYSTEROSCOPY WITH D & C N/A 03/13/2019   Procedure: DILATATION AND CURETTAGE /HYSTEROSCOPY;  Surgeon: Izell Harari, MD;  Location: [facility];  Service: Gynecology;  Laterality: N/A;   MITRAL VALVE REPAIR N/A 06/14/2023   Procedure: MITRAL VALVE REPAIR USING 32 MM SMUFORM  SEMI-RIGID ANNULOPLASTY RING;  Surgeon: Maryjane Mt, MD;  Location: [facility];  Service: Open Heart Surgery;  Laterality: N/A;   POLYPECTOMY  08/06/2020   Procedure: POLYPECTOMY;  Surgeon: Teressa Toribio SQUIBB, MD;  Location: [facility];  Service: Gastroenterology;;   RIGHT/LEFT HEART CATH AND CORONARY ANGIOGRAPHY Bilateral 03/31/2023   Procedure: RIGHT/LEFT HEART CATH AND CORONARY ANGIOGRAPHY;  Surgeon: Mady Bruckner, MD;  Location: [facility];  Service: Cardiovascular;  Laterality: Bilateral;   TEE WITHOUT CARDIOVERSION N/A 03/08/2023   Procedure: TRANSESOPHAGEAL ECHOCARDIOGRAM;  Surgeon: Loni Soyla LABOR, MD;  [facility]: [facility];  Service: Cardiovascular;  Laterality: N/A;   TEE WITHOUT CARDIOVERSION N/A 06/14/2023   Procedure: TRANSESOPHAGEAL ECHOCARDIOGRAM;  Surgeon: Maryjane Mt, MD;  Location: [facility];  Service: Open Heart Surgery;  Laterality: N/A;   TRICUSPID VALVE REPLACEMENT N/A 06/14/2023   Procedure: TRICUSPID VALVE REPAIR USING 32 MM EDWARDS MC3 TRICUSPID ANNULOPLASTY RING;  Surgeon: Maryjane Mt, MD;  Location: [facility];  Service: Open Heart Surgery;  Laterality: N/A;    Family History  Problem Relation Age of Onset   CAD  Mother    Diabetes Mellitus II Father    Colon cancer Father    Hypertension Sister    Breast cancer Paternal Aunt 67   CAD Maternal Grandmother    Kidney disease Son     Social History   Socioeconomic History   Marital status: Widowed    Spouse name: Not on file   Number of children: 3   Years of education: Not on file   Highest education level: Not on file  Occupational History   Not on file  Tobacco Use   Smoking status: Former    Current packs/day: 0.20    Types: Cigarettes   Smokeless tobacco: Never   Tobacco comments:    Quit smoking cigarettes 06/07/2023  was smoking 2 cigarettes a day  several months before surgery  Vaping Use   Vaping status: Never Used  Substance and Sexual Activity   Alcohol use: Not Currently   Drug  use: Yes    Types: Marijuana    Comment: nightly   Sexual activity: Yes    Partners: Male    Birth control/protection: Post-menopausal    Comment: widowed  Other Topics Concern   Not on file  Social History Narrative   Not on file   Social Drivers of Health   Financial Resource Strain: Not on File (09/23/2021)   Received from [employer]    Financial Resource Strain: 0  Food Insecurity: Not at Risk (10/26/2023)   Received from [facility] Insecurity    Within the past 12 months, you worried that your food would run out before you got money to buy more.: 1  Transportation Needs: Not at Risk (10/26/2023)   Received from [facility] Needs    In the past 12 months, has lack of transportation kept you from medical appointments, meetings, work or from getting things needed for daily living?: 1  Physical Activity: Not on File (09/23/2021)   Received from [facility]   Physical Activity    Physical Activity: 0  Stress: Not on File (09/23/2021)   Received from [facility]   Stress    Stress: 0  Social Connections: Moderately Integrated (06/14/2023)   Social Connection and Isolation Panel    Frequency of Communication with Friends and Family: More than three times a week    Frequency of Social Gatherings with Friends and Family: Once a week    Attends Religious Services: More than 4 times per year    Active Member of [employer] or Organizations: Yes    Attends Banker Meetings: More than 4 times per year    Marital Status: Widowed  Intimate Partner Violence: Not At Risk (06/14/2023)   Humiliation, Afraid, Rape, and Kick questionnaire    Fear of Current or Ex-Partner: No    Emotionally Abused: No    Physically Abused: No    Sexually Abused: No      Review of systems: All other review of systems negative except as mentioned in the HPI.   Physical Exam: Vitals:   04/02/24 1402  BP: (!) 100/58  Pulse: (!) 106   Body mass index is 30.87 kg/m. Gen:       No acute distress HEENT:  sclera anicteric CV: s1s2 rrr, no murmur Lungs: B/l clear. Abd:      soft, non-tender; no palpable masses, no distension Ext:    No edema Neuro: alert and oriented x 3 Psych: normal mood and affect  Data Reviewed:  Reviewed  labs, radiology imaging, old records and pertinent past GI work up   Assessment and Plan Assessment & Plan 68 year old very pleasant female with history of CHF s/p mitral and tricuspid valve annuloplasty, A-fib on chronic anticoagulation with Eliquis , fatty liver disease and alcohol related cirrhosis with no decompensation  Fatty liver disease/ Alcohol-related cirrhosis MELD 3.0: 12 at 04/02/2024  2:51 PM MELD-Na: 12 at 04/02/2024  2:51 PM  Baseline low MELD, INR slightly elevated in the setting of chronic anticoagulation with Eliquis .  No other features to suggest portal hypertension or liver decompensation based on exam or labs  Fatty liver disease identified on ultrasound, likely exacerbated by previous heart valve issues causing congestion. No worrisome findings on ultrasound to suggest HCC. - Ordered liver function tests, BMP, CBC, PT and INR - Advise to avoid alcohol and sugary foods to prevent progression - Schedule ultrasound next year for comparison  Congestive heart failure status post heart valve repair Status post heart valve repair in January, resulting in significant improvement in fluid retention and overall health. No current signs of fluid overload or edema. Heart murmur present but not concerning. Mechanical valve functioning well.  Return in 6 months to 1 year      This visit required 40 minutes of patient care (this includes precharting, chart review, review of results, face-to-face time used for counseling as well as treatment plan and follow-up. The patient was provided an opportunity to ask questions and all were answered. The patient agreed with the plan and demonstrated an understanding of the  instructions.  LOIS Wilkie Mcgee , MD    CC: Delores Delorise Gladis DEWAINE

## 2024-04-02 NOTE — Patient Instructions (Addendum)
 VISIT SUMMARY:  Today, we reviewed your liver function and overall health following your heart valve repair. We discussed your liver function, fluid retention, gastrointestinal health, dietary habits, and lower extremity fatigue.  Your provider has requested that you go to the basement level for lab work before leaving today. Press B on the elevator. The lab is located at the first door on the left as you exit the elevator.   YOUR PLAN:  FATTY LIVER DISEASE and cirrhosis: You have fatty liver disease, likely worsened by your previous heart valve issues. -We will order liver function tests to monitor your liver health. -Continue to avoid alcohol and sugary foods to prevent progression. -We will schedule an ultrasound next year for comparison.  CONGESTIVE HEART FAILURE STATUS POST HEART VALVE REPAIR: Your heart valve repair in January has significantly improved your fluid retention and overall health. There are no current signs of fluid overload or edema.  -We will have an annual follow-up   Due to recent changes in healthcare laws, you may see the results of your imaging and laboratory studies on MyChart before your provider has had a chance to review them.  We understand that in some cases there may be results that are confusing or concerning to you. Not all laboratory results come back in the same time frame and the provider may be waiting for multiple results in order to interpret others.  Please give us  48 hours in order for your provider to thoroughly review all the results before contacting the office for clarification of your results.    I appreciate the  opportunity to care for you  Thank You   Kavitha Nandigam , MD

## 2024-04-03 ENCOUNTER — Encounter: Payer: Self-pay | Admitting: Gastroenterology

## 2024-04-03 ENCOUNTER — Other Ambulatory Visit: Payer: Self-pay | Admitting: Internal Medicine

## 2024-04-03 ENCOUNTER — Ambulatory Visit: Payer: Self-pay | Admitting: Gastroenterology

## 2024-04-04 LAB — AFP TUMOR MARKER: AFP-Tumor Marker: 1.9 ng/mL

## 2024-04-09 ENCOUNTER — Other Ambulatory Visit: Payer: Self-pay | Admitting: Gastroenterology

## 2024-04-09 DIAGNOSIS — I5033 Acute on chronic diastolic (congestive) heart failure: Secondary | ICD-10-CM

## 2024-04-30 ENCOUNTER — Telehealth: Payer: Self-pay | Admitting: Internal Medicine

## 2024-04-30 DIAGNOSIS — I4821 Permanent atrial fibrillation: Secondary | ICD-10-CM

## 2024-04-30 DIAGNOSIS — I5033 Acute on chronic diastolic (congestive) heart failure: Secondary | ICD-10-CM

## 2024-04-30 NOTE — Telephone Encounter (Signed)
*  STAT* If patient is at the pharmacy, call can be transferred to refill team.   1. Which medications need to be refilled? (please list name of each medication and dose if known) metoprolol  tartrate (LOPRESSOR ) 25 MG tablet  digoxin  (LANOXIN ) 0.125 MG tablet   2. Would you like to learn more about the convenience, safety, & potential cost savings by using the [facility]?    3. Are you open to using the [facility] (Type [facility]. ).   4. Which pharmacy/location (including street and city if local pharmacy) is medication to be sent to? [id number] - [place], [place] - [address]    5. Do they need a 30 day or 90 day supply? 90 day

## 2024-05-01 MED ORDER — METOPROLOL TARTRATE 25 MG PO TABS: 25.0000 mg | ORAL_TABLET | Freq: Two times a day (BID) | ORAL | 1 refills | Status: AC

## 2024-05-01 MED ORDER — DIGOXIN 125 MCG PO TABS: 0.0625 mg | ORAL_TABLET | Freq: Every day | ORAL | 1 refills | Status: AC

## 2024-05-01 NOTE — Telephone Encounter (Signed)
 Refill sent.

## 2024-05-08 ENCOUNTER — Telehealth: Payer: Self-pay | Admitting: Internal Medicine

## 2024-05-08 DIAGNOSIS — I4821 Permanent atrial fibrillation: Secondary | ICD-10-CM

## 2024-05-08 NOTE — Telephone Encounter (Signed)
*  STAT* If patient is at the pharmacy, call can be transferred to refill team.   1. Which medications need to be refilled? (please list name of each medication and dose if known)   digoxin  (LANOXIN ) 0.125 MG tablet  apixaban  (ELIQUIS ) 5 MG TABS tablet   spironolactone  (ALDACTONE ) 100 MG tablet    2. Would you like to learn more about the convenience, safety, & potential cost savings by using the [facility]? No    3. Are you open to using the [facility] (Type [facility]. No    4. Which pharmacy/location (including street and city if local pharmacy) is medication to be sent to? [id number] - [place], [place] - [address]     5. Do they need a 30 day or 90 day supply? 30 day    Pt is out of Eliquis .

## 2024-05-09 MED ORDER — APIXABAN 5 MG PO TABS: 5.0000 mg | ORAL_TABLET | Freq: Two times a day (BID) | ORAL | 5 refills | Status: AC

## 2024-05-09 NOTE — Telephone Encounter (Signed)
 Pt has refills at pharmacy for Digoxin  and Spironolactone .  Will forward to Pharm-D to fill Eliquis .

## 2024-05-17 ENCOUNTER — Other Ambulatory Visit: Payer: Self-pay | Admitting: Internal Medicine

## 2024-08-12 ENCOUNTER — Other Ambulatory Visit (HOSPITAL_COMMUNITY)
